# Patient Record
Sex: Male | Born: 1950 | Race: White | Hispanic: No | Marital: Married | Smoking: Former smoker
Health system: Southern US, Community
[De-identification: ages and names within clinical notes are randomized; demographics above are authoritative.]

## PROBLEM LIST (undated history)

## (undated) DIAGNOSIS — F419 Anxiety disorder, unspecified: Secondary | ICD-10-CM

## (undated) DIAGNOSIS — G4730 Sleep apnea, unspecified: Secondary | ICD-10-CM

## (undated) DIAGNOSIS — D0321 Melanoma in situ of right ear and external auricular canal: Secondary | ICD-10-CM

## (undated) DIAGNOSIS — I10 Essential (primary) hypertension: Secondary | ICD-10-CM

## (undated) DIAGNOSIS — E785 Hyperlipidemia, unspecified: Secondary | ICD-10-CM

## (undated) DIAGNOSIS — C3490 Malignant neoplasm of unspecified part of unspecified bronchus or lung: Secondary | ICD-10-CM

## (undated) DIAGNOSIS — I219 Acute myocardial infarction, unspecified: Secondary | ICD-10-CM

## (undated) DIAGNOSIS — I2510 Atherosclerotic heart disease of native coronary artery without angina pectoris: Secondary | ICD-10-CM

## (undated) DIAGNOSIS — E119 Type 2 diabetes mellitus without complications: Secondary | ICD-10-CM

## (undated) DIAGNOSIS — C3411 Malignant neoplasm of upper lobe, right bronchus or lung: Secondary | ICD-10-CM

## (undated) DIAGNOSIS — J449 Chronic obstructive pulmonary disease, unspecified: Secondary | ICD-10-CM

## (undated) HISTORY — PX: CARDIAC SURGERY: SHX584

## (undated) HISTORY — PX: OTHER SURGICAL HISTORY: SHX169

## (undated) HISTORY — PX: CARDIAC CATHETERIZATION: SHX172

## (undated) HISTORY — PX: SURGERY OF LIP: SUR1315

---
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
## 1999-04-07 ENCOUNTER — Emergency Department (HOSPITAL_COMMUNITY): Admission: EM | Admit: 1999-04-07 | Discharge: 1999-04-07 | Payer: Self-pay | Admitting: Emergency Medicine

## 1999-04-07 ENCOUNTER — Encounter: Payer: Self-pay | Admitting: Endocrinology

## 1999-07-14 ENCOUNTER — Inpatient Hospital Stay (HOSPITAL_COMMUNITY): Admission: EM | Admit: 1999-07-14 | Discharge: 1999-07-16 | Payer: Self-pay | Admitting: Specialist

## 2004-10-14 DIAGNOSIS — I2510 Atherosclerotic heart disease of native coronary artery without angina pectoris: Secondary | ICD-10-CM

## 2004-10-14 HISTORY — PX: CORONARY ARTERY BYPASS GRAFT: SHX141

## 2004-10-14 HISTORY — DX: Atherosclerotic heart disease of native coronary artery without angina pectoris: I25.10

## 2005-01-06 ENCOUNTER — Inpatient Hospital Stay (HOSPITAL_COMMUNITY): Admission: EM | Admit: 2005-01-06 | Discharge: 2005-01-15 | Payer: Self-pay | Admitting: Emergency Medicine

## 2005-02-14 ENCOUNTER — Encounter
Admission: RE | Admit: 2005-02-14 | Discharge: 2005-02-14 | Payer: Self-pay | Admitting: Thoracic Surgery (Cardiothoracic Vascular Surgery)

## 2005-08-12 ENCOUNTER — Ambulatory Visit: Payer: Self-pay | Admitting: Internal Medicine

## 2005-08-30 ENCOUNTER — Ambulatory Visit: Payer: Self-pay | Admitting: *Deleted

## 2008-02-22 ENCOUNTER — Emergency Department (HOSPITAL_COMMUNITY): Admission: EM | Admit: 2008-02-22 | Discharge: 2008-02-23 | Payer: Self-pay | Admitting: Emergency Medicine

## 2008-10-14 HISTORY — PX: CORONARY STENT PLACEMENT: SHX1402

## 2009-09-04 ENCOUNTER — Inpatient Hospital Stay (HOSPITAL_COMMUNITY): Admission: EM | Admit: 2009-09-04 | Discharge: 2009-09-07 | Payer: Self-pay | Admitting: Emergency Medicine

## 2009-09-05 ENCOUNTER — Encounter (INDEPENDENT_AMBULATORY_CARE_PROVIDER_SITE_OTHER): Payer: Self-pay | Admitting: Cardiovascular Disease

## 2010-10-09 DIAGNOSIS — I10 Essential (primary) hypertension: Secondary | ICD-10-CM

## 2010-10-09 HISTORY — DX: Essential (primary) hypertension: I10

## 2010-11-04 ENCOUNTER — Encounter: Payer: Self-pay | Admitting: Thoracic Surgery (Cardiothoracic Vascular Surgery)

## 2010-11-20 ENCOUNTER — Other Ambulatory Visit: Payer: Self-pay | Admitting: Cardiovascular Disease

## 2010-11-20 ENCOUNTER — Ambulatory Visit
Admission: RE | Admit: 2010-11-20 | Discharge: 2010-11-20 | Disposition: A | Discharge status: Home / Self Care | Payer: Medicaid Other | Source: Ambulatory Visit | Attending: Cardiovascular Disease | Admitting: Cardiovascular Disease

## 2010-11-20 DIAGNOSIS — R0602 Shortness of breath: Secondary | ICD-10-CM

## 2010-11-22 ENCOUNTER — Ambulatory Visit (HOSPITAL_COMMUNITY)
Admission: RE | Admit: 2010-11-22 | Discharge: 2010-11-22 | Disposition: A | Discharge status: Home / Self Care | Payer: Medicare Other | Source: Ambulatory Visit | Attending: Cardiovascular Disease | Admitting: Cardiovascular Disease

## 2010-11-22 DIAGNOSIS — J4489 Other specified chronic obstructive pulmonary disease: Secondary | ICD-10-CM | POA: Insufficient documentation

## 2010-11-22 DIAGNOSIS — J449 Chronic obstructive pulmonary disease, unspecified: Secondary | ICD-10-CM | POA: Insufficient documentation

## 2011-01-16 LAB — BASIC METABOLIC PANEL
BUN: 9 mg/dL (ref 6–23)
CO2: 24 mEq/L (ref 19–32)
CO2: 26 mEq/L (ref 19–32)
CO2: 29 mEq/L (ref 19–32)
Calcium: 8.9 mg/dL (ref 8.4–10.5)
Calcium: 9 mg/dL (ref 8.4–10.5)
Calcium: 9 mg/dL (ref 8.4–10.5)
Chloride: 108 mEq/L (ref 96–112)
Chloride: 99 mEq/L (ref 96–112)
Creatinine, Ser: 0.91 mg/dL (ref 0.4–1.5)
GFR calc Af Amer: >60 mL/min (ref >60)
GFR calc Af Amer: >60 mL/min (ref >60)
GFR calc non Af Amer: >60 mL/min (ref >60)
GFR calc non Af Amer: >60 mL/min (ref >60)
Glucose, Bld: 101 mg/dL — ABNORMAL HIGH (ref 70–99)
Glucose, Bld: 110 mg/dL — ABNORMAL HIGH (ref 70–99)
Glucose, Bld: 142 mg/dL — ABNORMAL HIGH (ref 70–99)
Potassium: 3.8 mEq/L (ref 3.5–5.1)
Potassium: 3.9 mEq/L (ref 3.5–5.1)
Sodium: 138 mEq/L (ref 135–145)
Sodium: 140 mEq/L (ref 135–145)
Sodium: 140 mEq/L (ref 135–145)

## 2011-01-16 LAB — LIPID PANEL
Cholesterol: 146 mg/dL (ref 0–200)
HDL: 22 mg/dL — ABNORMAL LOW (ref >39)
Triglycerides: 159 mg/dL — ABNORMAL HIGH (ref <150)

## 2011-01-16 LAB — CARDIAC PANEL(CRET KIN+CKTOT+MB+TROPI)
CK, MB: 1.5 ng/mL (ref 0.3–4.0)
CK, MB: 2.2 ng/mL (ref 0.3–4.0)
Relative Index: 1.4 (ref 0.0–2.5)
Relative Index: 3 — ABNORMAL HIGH (ref 0.0–2.5)
Relative Index: INVALID (ref 0.0–2.5)
Total CK: 107 U/L (ref 7–232)
Total CK: 108 U/L (ref 7–232)
Total CK: 125 U/L (ref 7–232)
Total CK: 85 U/L (ref 7–232)
Troponin I: 0.33 ng/mL — ABNORMAL HIGH (ref 0.00–0.06)

## 2011-01-16 LAB — DIFFERENTIAL
Basophils Relative: 0 % (ref 0–1)
Eosinophils Relative: 3 % (ref 0–5)
Lymphocytes Relative: 13 % (ref 12–46)
Lymphs Abs: 2.3 10*3/uL (ref 0.7–4.0)
Monocytes Relative: 8 % (ref 3–12)
Neutrophils Relative %: 75 % (ref 43–77)

## 2011-01-16 LAB — CBC
HCT: 41.2 % (ref 39.0–52.0)
Hemoglobin: 14.1 g/dL (ref 13.0–17.0)
Hemoglobin: 14.1 g/dL (ref 13.0–17.0)
Hemoglobin: 14.2 g/dL (ref 13.0–17.0)
MCHC: 34.3 g/dL (ref 30.0–36.0)
MCV: 92.2 fL (ref 78.0–100.0)
Platelets: 235 10*3/uL (ref 150–400)
RBC: 4.45 MIL/uL (ref 4.22–5.81)
RBC: 4.46 MIL/uL (ref 4.22–5.81)
RBC: 5.01 MIL/uL (ref 4.22–5.81)
RDW: 13 % (ref 11.5–15.5)
RDW: 13 % (ref 11.5–15.5)
WBC: 13.7 10*3/uL — ABNORMAL HIGH (ref 4.0–10.5)
WBC: 17.1 10*3/uL — ABNORMAL HIGH (ref 4.0–10.5)

## 2011-01-16 LAB — POCT CARDIAC MARKERS: Myoglobin, poc: 113 ng/mL (ref 12–200)

## 2011-01-16 LAB — URINALYSIS, ROUTINE W REFLEX MICROSCOPIC
Bilirubin Urine: NEGATIVE
Glucose, UA: NEGATIVE mg/dL
Hgb urine dipstick: NEGATIVE
Ketones, ur: NEGATIVE mg/dL
Nitrite: NEGATIVE
Protein, ur: NEGATIVE mg/dL
Specific Gravity, Urine: 1.013 (ref 1.005–1.030)
Urobilinogen, UA: 1 mg/dL (ref 0.0–1.0)
pH: 7 (ref 5.0–8.0)

## 2011-01-16 LAB — CK TOTAL AND CKMB (NOT AT ARMC)
CK, MB: 4 ng/mL (ref 0.3–4.0)
Relative Index: 2.9 — ABNORMAL HIGH (ref 0.0–2.5)

## 2011-01-16 LAB — PROTIME-INR
INR: 0.99 (ref 0.00–1.49)
Prothrombin Time: 13.3 seconds (ref 11.6–15.2)

## 2011-01-16 LAB — TROPONIN I: Troponin I: 0.31 ng/mL — ABNORMAL HIGH (ref 0.00–0.06)

## 2011-01-16 LAB — PLATELET COUNT: Platelets: 227 10*3/uL (ref 150–400)

## 2011-03-01 NOTE — Cardiovascular Report (Signed)
[name], [name] NO.:  [id number]   MEDICAL RECORD NO.:  [id number]          PATIENT TYPE:  INP   LOCATION:  2903                         FACILITY:  [facility]   PHYSICIAN:  Nicki Guadalajara, M.D.     DATE OF BIRTH:  04-04-1951   DATE OF PROCEDURE:  01/07/2005  DATE OF DISCHARGE:                              CARDIAC CATHETERIZATION   PROCEDURE:  Cardiac catheterization.   CARDIOLOGIST:  Nicki Guadalajara, M.D.   INDICATIONS FOR PROCEDURE:  [name] is a 60 year old gentleman who  has not sought medical attention for years.  He has a longstanding history  of tobacco use, having started at age 37.  He currently smokes up to two to  three packs of cigarettes per day.  When he is not smoking he often chews  tobacco.  He works as a Visual merchandiser.  Yesterday while attending to a calf, he  developed significant new onset substernal chest pressure.  The symptom  complex lasted for approximately 45 minutes.  Ultimately he presented to  [facility].  His symptom complex improved with sublingual  nitroglycerin.  An electrocardiogram showed nonspecific T-wave changes in V4  through V6.  He also was noted to be significantly hypertensive on  presentation.  The initial CPK enzymes were negative, but the subsequent  troponin was slightly positive.  He was treated with IV nitroglycerin and  heparin and started on beta blocker therapy, and scheduled for a cardiac  catheterization.   DESCRIPTION OF PROCEDURE:  After premedication with Valium, a total of 7 mg  intravenously, the patient was prepped and draped in the usual fashion.  His  right femoral artery was punctured anteriorly and a 6-French sheath was  inserted.  Diagnostic catheterization was done with a 6-French Judkins #4  left and right diagnostic catheters.  The right catheter was also used for  selective angiography into the left subclavian and internal mammary artery  system.  A 6-French pigtail catheter  was used for biplane cine left  arteriography as well as distal aortography.  At this point, I broke scrub  and reviewed the cine angiograms.  I also asked Dr. Pearletha Furl. Alanda Amass to  come into the laboratory and cine angiograms were also reviewed.  Hemostasis  was done by direct manual pressure.  The patient tolerated the procedure  well.   HEMODYNAMIC DATA:  Central aortic pressure:  146/81.  Left ventricular pressure:  146/13.   ANGIOGRAPHIC DATA:  1.  LEFT MAIN CORONARY ARTERY:  The left main coronary artery was      angiographically normal and trifurcated into an LAD, a small      intermediate system, and a left circumflex system.  There was evidence      for mild coronary calcification proximally.  The proximal LAD had 70%      narrowing prior to giving rise to the first diagonal vessel.  The first      diagonal vessel had diffuse disease of 70%-80% prior to its bifurcation.      The superior limb had 70% ostial narrowing.  The inferior  limb of a      large diagonal system had diffuse 90%-95% stenosis.  The LAD after the      diagonal vessel had diffuse narrowing of 40%-60%, followed by an 80%-90%      segment.  2.  INTERMEDIATE CORONARY ARTERY:  The intermediate vessel was a small      caliber vessel.  3.  CIRCUMFLEX CORONARY ARTERY:  The circumflex vessel was a large caliber      vessel that had 70%-80% proximal stenosis on the proximal bend of the      vessel.  4.  RIGHT CORONARY ARTERY:  The right coronary artery is a large caliber      dominant vessel that had diffuse smooth narrowing of 30% proximally, 40%-      50% narrowing in the region of the crux with 90% stenosis in an acute      marginal branch proximally.  There also was 50% near ostial PDA      narrowing.  5.  LEFT SUBCLAVIAN AND INTERNAL MAMMARY ARTERY SYSTEM:  The left subclavian      and internal mammary artery system was normal an suitable for CVG      revascularization surgery.   BIPLANE CINE LEFT  VENTRICULOGRAPHY:  Revealed mild LV dysfunction with focal  hypocontractility in the mid-inferior wall and in the mid-distal  anterolateral wall.  The ejection fraction was approximately 40%-45%.  There  also was hypocontractility in the inferior apical segment on the LAO  projection.   DISTAL AORTOGRAPHY:  Revealed vessel tortuosity without high-grade  obstructive disease.   IMPRESSION:  1.  Mild left ventricular dysfunction with hypocontractility involving the      mid-inferior wall, the inferoapical region and the anterolateral      segment.  2.  Multivessel coronary artery disease with a diffuse 70% proximal left      anterior descending coronary artery stenosis, a 70%-80% stenosis      proximally in the diagonal vessel, with a 90%-95% mid-diagonal stenosis      in a large inferior branch of the diagonal vessel, a 70% ostial superior      branch and 30%, 60% and 80%-90% middle left anterior descending coronary      artery stenoses, a 70%-80% stenosis in the circumflex vessel and diffuse      30% proximal and 40%-50% stenosis in the region of the acute margin of      the right coronary artery with 90% stenosis in the acute marginal      branch, with a 50% ostial posterior descending artery stenosis.   RECOMMENDATIONS:  The cine angiograms will be reviewed with colleagues.  A  CVTS consultation will be obtained, in light of the patient's diffuse  multivessel coronary artery disease.  Although he is only 60 years of age  chronologically, physiologically his significant tobacco history has  undoubtedly accelerated his aging process.  Aggressive lipid intervention  and medical therapy will also be commenced.      TK/MEDQ  D:  01/07/2005  T:  01/07/2005  Job:  [id number]   cc:   Cardiac Cath Lab  -  [facility]

## 2011-03-01 NOTE — Discharge Summary (Signed)
[name], Troy [name] NO.:  [id number]   MEDICAL RECORD NO.:  [id number]          PATIENT TYPE:  INP   LOCATION:  2029                         FACILITY:  [facility]   PHYSICIAN:  Salvatore Decent. Dorris Fetch, M.D.DATE OF BIRTH:  August 31, 1951   DATE OF ADMISSION:  01/06/2005  DATE OF DISCHARGE:  01/15/2005                               DISCHARGE SUMMARY   ADMISSION DIAGNOSIS:  Unstable angina.   DISCHARGE DIAGNOSIS:  1. 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  2. Heavy tobacco abuse.  3. History of injury to his left hand requiring repair of metacarpals.  4. 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  5. Dyslipidemia.  6. Intolerance to codeine which causes nausea and vomiting.   PROCEDURE:  1. January 11, 2005, median sternotomy for coronary artery bypass      grafting x 4 using the left internal mammary artery to the mid left      anterior descending, saphenous vein graft to the first diagonal,      saphenous vein graft to the first obtuse marginal, saphenous vein      graft to the posterior descending, endoscopic vein harvesting from      the right leg, surgeon Salvatore Decent. Dorris Fetch, M.D.  2. January 07, 2005, cardiac catheterization by Dr. Nicki Guadalajara with      impression showing mild left ventricular dysfunction, multivessel      coronary artery disease, ejection fraction estimated at      approximately 40-45%.  3. January 08, 2005, carotid Duplex showing bilateral mild intimal wall      changes with mild heterogeneous plaque at the bifurcation.  No      evidence of internal carotid artery stenosis.  The vertebral artery      flow was antegrade.  4. January 08, 2005, lower extremity arterial studies showing ankle      brachial indices greater than 1 bilaterally with normal Doppler      wave forms at rest.   BRIEF HISTORY:  Troy [name] is a 60 year old Caucasian male who had not  sought medical attention for years.  He has a long-standing history of  tobacco use  having started at age 60.  Prior to admission, he smoked 2-3  packs cigarettes per day.  When he is not smoking, he also chews  tobacco.  He works as a Visual merchandiser.  On January 06, 2005, while attending to a  calf, he developed significant new onset substernal chest pressure.  The  symptom complex lasted approximately 45 minutes.  Ultimately, he  presented to [facility].  His chest pain was then relieved with  sublingual nitroglycerin.  EKG showed nonspecific T-wave changes in  leads v4 through v6.  He also was noted to be significantly hypertensive  on presentation.  Initial CPK enzymes were negative but subsequently  slightly positive.  He was treated with IV nitroglycerin and heparin and  started on beta blocker therapy and admitted to [facility] for  scheduled cardiac catheterization.   HOSPITAL COURSE:  On January 06, 2005, Troy [name] was admitted to [facility] for unstable angina.  He was managed on IV nitroglycerin  and heparin as well as beta blocker.  He underwent cardiac  catheterization with results as discussed above.  Based on the findings  of multivessel coronary artery disease, Dr. Charlett Lango of CVTS  was consulted regarding possible coronary revascularization.  After  examining the patient and review of his films, it was felt that he would  benefit from coronary artery bypass grafting.  After discussing the  risks, benefits, and alternatives, Troy [name] agreed to proceed.  The  surgery was scheduled for January 11, 2005.  In the meantime, he remained  on the same medication regimen.  He also underwent a tobacco cessation  consult.  He also underwent pre-cardiac surgery Doppler studies with  results as discussed above.  On March 31, he did undergo coronary artery  bypass grafting.  Overall, he was felt to tolerate the procedure well  and there was no known interoperative complications.  Postoperatively,  he was transferred to the surgical intensive  care unit in stable  condition.  By that evening, he was extubated and neurologically intact.  He remained in the intensive care unit through postoperative day two.  During this time, his chest tubes were discontinued as there was minimal  output.  He did require aggressive pulmonary toilet and was started on  Humibid as well as Albuterol nebulizer treatments.  He also required  gentle diuresis for mild fluid volume excess.  Of note, postoperatively,  his labs were significant for leukocytosis with a white count up to  21.5.  This was thought most likely due to respiratory nature.  He was  started on oral Avelox.  In addition, he did have minimal sanguinous  drainage from the superior aspect of his sternal incision, however,  there was no surrounding erythema and it was felt that this was most  likely not the source of his leukocytosis.  Once on Avelox, his white  blood count did begin trending down.  He also remained afebrile.  By  postoperative day three, he had been transferred out to unit 2000.  He  remained stable with vital signs showing blood pressure 129/84, heart  rate in sinus rhythm ranging in the 90s to about 115.  Due to his mild  tachycardia, his beta blocker was increased with a good response.  In  the morning, he was still requiring 2 liters of oxygen per nasal  cannula, however, by the afternoon, he had been weaned and was  saturating greater than 90%.  He did report some mild shortness of  breath, however, he also reported this was at his baseline.  Otherwise,  he was feeling well.  His bowel and bladder functioned appropriately.  His pain was controlled on oral medications.  He was ambulating  independently.  On exam, his heart had a regular rate and rhythm and  there was no murmur.  His lung sounds were coarse but chest x-ray showed  stable left lower lobe atelectasis and tiny bilateral pleural effusions.  He was continued on his previously mentioned pulmonary regimen.   His  abdomen was soft, nontender, good bowel sounds.  Extremities showed no  edema.  His right leg incisions were clean and dry.  His sternal  incision as previously described.  His weight was nearly at baseline.  His labs were also stable showing decreasing white blood count as well  as decreasing creatinine at 1.4 which had peaked at 1.6.  It was felt  that  if Troy [name] could continue to progress in this manner that he  would be stable for discharge in the next 24-48 hours.  The official  discharge orders will be written during morning rounds pending there are  no significant changes in his status.  Troy [name] anticipated date of  discharge is January 15, 2005.   CONSULTATIONS:  1. Smoking cessation.  2. Cardiac rehab, phase I.  3. Pharmacy for heparin dosing.  4. Cardiology, Dr. Nicki Guadalajara (attending service on admission).   LABORATORY DATA:  Recent labs show a white blood count 18.6, hemoglobin  11, hematocrit 31.5, platelet count 214.  Sodium 136, potassium 3.9,  chloride 101, CO2 26, blood glucose 102, BUN 26, creatinine 1.4, calcium  8.7, magnesium 2.6.  Hemoglobin A1C 5.7.  Total bilirubin 0.9, alkaline  phos 53, SGOT 17, SGPT 13, total protein 5.3, albumin 2.8.   DISCHARGE MEDICATIONS:  1. Enteric coated aspirin 325 mg 1 p.o. daily.  2. Lopressor 25 mg 1 p.o. b.i.d.  3. Lipitor 80 mg 1 p.o. q.a.m.  4. Wellbutrin XL 150 mg 1 p.o. daily.  5. Xanax 0.5 mg 1 p.o. b.i.d. p.r.n. anxiety.  6. Multi-vitamin 1 p.o. daily.  7. Avelox 400 mg 1 p.o. daily x 7 days.  8. Albuterol inhaler 2 puffs q.4h. p.r.n. shortness of breath or      wheezing.  9. Tylox 1-2 tablets p.o. q.4-6h as needed for pain.   DISCHARGE INSTRUCTIONS:  Activities:  He is instructed to avoid driving  or heavy lifting more than 10 pounds.  He should continue daily  breathing exercises and daily walking.  He should follow with a low salt  diet.  He may shower and clean his incisions daily with mild soap and   water.  He should notify the CVTS office if he develops fever greater  than 101, redness or drainage from his incision site.  Follow up:  1. He is to follow up with Dr. Dorris Fetch at the CVTS office on Feb 14, 2005, at 11:45 a.m.  He is to have a chest x-ray at [facility] one hour before this appointment and was instructed to      bring his chest x-ray films with him to the CVTS office.  2. He is to call [phone] to schedule two weeks follow up with Dr.      Nicki Guadalajara.      Jerold Coombe, P.A.      Salvatore Decent Dorris Fetch, M.D.  Electronically Signed    AWZ/MEDQ  D:  01/14/2005  T:  01/14/2005  Job:  [id number]   cc:   Nicki Guadalajara, M.D.

## 2011-03-01 NOTE — Op Note (Signed)
NAMERAHMIR, BEEVER NO.:  [id number]   MEDICAL RECORD NO.:  [id number]          PATIENT TYPE:  INP   LOCATION:  2316                         FACILITY:  [facility]   PHYSICIAN:  Salvatore Decent. Dorris Fetch, M.D.DATE OF BIRTH:  1951-02-14   DATE OF PROCEDURE:  01/11/2005  DATE OF DISCHARGE:                                 OPERATIVE REPORT   PREOPERATIVE DIAGNOSIS:  Three vessel disease with unstable angina.   POSTOPERATIVE DIAGNOSIS:  Three vessel disease with unstable angina.   PROCEDURE:  Median sternotomy, extracorporeal circulation, coronary artery  bypass grafting x4 (left internal mammary artery to left anterior  descending, saphenous vein graft to first diagonal, saphenous vein graft to  first obtuse marginal, saphenous vein graft to posterior descending),  endoscopic vein harvest right leg.   SURGEON:  Salvatore Decent. Dorris Fetch, M.D.   ASSISTANT:  Jerold Coombe, P.A.   ANESTHESIA:  General anesthesia.   FINDINGS:  Good quality targets, excellent quality conduits, left  ventricular hypertrophy.   CLINICAL NOTE:  Mr. Hoaglin is a 60 year old gentleman with a history of  heavy tobacco abuse who presented with an unstable coronary syndrome.  At  cardiac catheterization he had three vessel disease with mildly impaired  left ventricular function.  He was referred for coronary artery bypass  grafting.  The indications, risks, benefits, and alternatives were discussed  in detail with the patient.  He understood and accepted the risks and agreed  to proceed.   The patient was not felt to be a candidate for bilateral mammary arteries  due to his history of heavy tobacco abuse, anatomy was not suitable with  tight enough lesions in circumflex or PDA to justify radial artery harvest.   DESCRIPTION OF PROCEDURE:  Mr. Schwartz was brought to the preoperative  holding area on January 11, 2005.  There lines were placed to monitor  arterial, central venous and pulmonary  arterial pressure.  EKG leads were  placed for continuous telemetry.  Intravenous antibiotics were administered.  The patient was taken to the operating room, anesthetized and intubated.  A  Foley catheter was placed.  The chest, abdomen and legs were prepped and  draped in the usual fashion.   A median sternotomy was performed. The left internal mammary artery was  harvested using standard technique. Simultaneously, incision was made in the  medial aspect of the right leg at the level of the knee.  The greater  saphenous vein was harvested from mid calf to groin endoscopically.  Both  the saphenous vein and mammary artery were excellent quality conduits.  There was 5000 units of heparin was administered while taking down the  conduits.  The remainder of the full heparin dose was administered prior to  initiating cardiopulmonary bypass.   The pericardium was opened.  The ascending aorta was inspected and palpated.  It was normal size, normal caliber with no palpable atherosclerotic disease.  The aorta was cannulated via concentric 2-0 Ethibond nonpledgeted  pursestring suture.  A dual stage venous cannula was placed via pursestring  suture in the right atrial appendage.  Cardiopulmonary bypass was  instituted  and the patient was cooled to 32 degrees C.  The coronary arteries were  inspected and the anastomotic sites were chosen.  The conduits were  inspected and cut to length.  Of note, the acute marginal branch of the  right coronary was a very small vessel, not suitable for grafting.  The  heart was insulated with a foam pad in the pericardium.  A temperature probe  was placed in the myocardium septum.  A cardioplegia cannula was placed in  the ascending aorta.   The aorta was crossclamped.  The left ventricle was entered via the aortic  root vent.  Cardiac arrest was achieved with a combination of cold blood  antegrade cardioplegia and topical iced saline.  Initially 500 mL of   cardioplegia was administered.  There was rapid septal cooling to 9 degrees  C.  Additional cardioplegia was administered at the completion of each vein  graft, down the graft as well as via the aortic root in order to maintain  myocardial septal temperature to less than 15 degrees C. throughout the  procedure.   The following distal anastomoses were performed.  First a reverse saphenous  vein graft was placed end-to-side to the posterior descending branch of the  right coronary.  The vein was of excellent quality.  The posterior  descending was a good quality target.  It was 1.5 mm in diameter.  An end-to-  side anastomosis was performed with a running 7-0 Prolene suture.  This  anastomosis and all other were probed proximally and distally at its  completion to insure patency.  The graft flushed easily and cardioplegia was  administered.   Next, a reversed saphenous vein graft was placed end-to-side to the first  obtuse marginal branch of the left circumflex coronary artery.  This was a  1.5 mm good quality target given the vein was good quality, the anastomosis  was performed with a running 7-0 Prolene suture.  There was excellent flow.  Cardioplegia was administered.  There was good hemostasis.   Next, a reverse saphenous vein graft was placed end-to-side to the first  diagonal branch of the LAD.  This was a large diagonal vessel that traversed  parallel to the LAD.  It was 1.5 mm and good quality.  The vein graft was a  good quality as well.   Next, the left internal mammary artery was brought through a window in the  pericardium.  The distal end was spatulated then was anastomosed end-to-side  to the distal LAD.  The LAD was a 1.5 mm good quality target.  The mammary  was a 2 mm good quality conduit.  The anastomosis was performed end-to-side  with a running 8-0 Prolene suture.  At the completion of the anastomosis, bulldog clamp was briefly removed and was inspected for  hemostasis.  Immediate rapid septal rewarming was noted.  The bulldog clamp was replaced.  The mammary pedicle was tacked to the epicardial surface of the heart with 6-  0 Prolene sutures.  Additional cardioplegia was administered down the vein  grafts and the aortic root.   The vein grafts were cut to length.  The cardioplegia cannula was removed  from the ascending aorta and the proximal vein graft to the anastomoses were  performed through 4.5 mm punch aortotomies with running 6-0 Prolene sutures.  At the completion of the final proximal anastomosis, lidocaine was  administered.  The bulldog clamp was again removed from the mammary artery.  Immediate and  rapid septal rewarming was once again noted.  The aortic root  was deaired.  The patient was placed in Trendelenburg position and the  aortic crossclamp was removed.  The total crossclamp time was 81 minutes.   While the patient was being rewarmed, all proximal and distal anastomoses  were inspected for hemostasis.  Epicardial pacing wires were placed on the  right ventricle and right atrium.  When the patient had been rewarmed to a  core temperature of temperature of 37 degrees C, the patient was weaned from  cardiopulmonary bypass without difficulty.  He was on a low dose dopamine  infusion at the time of separation from bypass and was DDD paced.  The  initial cardiac index was greater than 2 liters/minute/sq m.  Total bypass  time was 115 minutes.  Patient remained hemodynamically stable throughout  the post bypass period.   A test dose of protamine was administered and was well tolerated.  The  atrial and aortic cannulae were removed.  The remainder of the protamine was  administered without incident.  The chest was irrigated with one liter of  warm normal saline containing 1 g of vancomycin.  Hemostasis was achieved.  The pericardium was reapproximated with interrupted 3-0 silk sutures  __________ easily without tension.  the  left pleural and two mediastinal  chest tubes sere placed through separate subcostal incisions.  The sternum  was closed with interrupted heavy gauge wires.  The pectoralis, subcutaneous  tissue and skin closed in standard fashion.  Subcuticular closures were used  for the skin.  All sponge, needle and instrument counts were correct at the  end of the procedure.  There were no intraoperative complications.  The  patient was taken from the operating room to the surgical intensive care  unit in critical but stable condition.      SCH/MEDQ  D:  01/11/2005  T:  01/11/2005  Job:  [id number]   cc:   Nicki Guadalajara, M.D.  [address]  [place], [place] [address]  Fax: [phone]

## 2011-03-01 NOTE — Consult Note (Signed)
NAMETYJUAN, DEMETRO NO.:  [id number]   MEDICAL RECORD NO.:  [id number]          PATIENT TYPE:  INP   LOCATION:  2903                         FACILITY:  [facility]   PHYSICIAN:  Salvatore Decent. Dorris Fetch, M.D.DATE OF BIRTH:  April 08, 1951   DATE OF CONSULTATION:  01/08/2005  DATE OF DISCHARGE:                                   CONSULTATION   REASON FOR CONSULTATION:  Three-vessel disease with unstable angina.   HISTORY OF PRESENT ILLNESS:  Mr. Crooker is a 60 year old gentleman, who  presents with a chief complaint of chest pain.  He has no prior cardiac  history and no previous of angina.  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  He  also developed some tingling in his left hand and fingers.  He took 2  aspirin.  He did feel clammy and short of breath.  He did not have any  nausea or vomiting.  He did get dizzy.  He continued to have the pain.  He  went to his neighbor and called EMS.  The pain ended up lasting between 15  and 30 minutes; the patient was not sure.  He ruled out for a myocardial  infarction, but his troponins were slightly elevated at 0.07.  Yesterday, he  underwent cardiac catheterization which revealed severe three-vessel  coronary disease with an ejection fraction of 40-45%.  He has been pain free  since admission, is currently on intravenous heparin.   PAST MEDICAL HISTORY:  1.  Heavy tobacco abuse.  2.  He has had injury to his left hand, requiring repair of metacarpals.  3.  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  4.  He denies diabetes, hypertension, known hyperlipidemia, although he has      not consistently seeing doctors.   MEDICATIONS AT TIME OF ADMISSION:  Aspirin 81 mg daily.   ALLERGIES:  He gets nausea and vomiting with CODEINE.   FAMILY HISTORY:  Remarkable for early coronary disease.  Father  died of MI  at age 69.  His father also had had a stroke.  He had a brother who had a  pacemaker.   SOCIAL HISTORY:  He is married.  He works on a farm.  He smokes heavily, 2-3  packs a day since age 63.   REVIEW OF SYSTEMS:  The patient had been in his usual state of good health.  He denies any early fatigue or tiring.  He has had chest pain just with the  recent event and none previously.  He does have a nonproductive cough.  He  does have some reflux symptoms.  No other changes in bowel habits.  All  other systems are negative.   PHYSICAL EXAMINATION:  GENERAL:  Mr. Rackley is a 60 year old gentleman who  appears older than his stated age.  He is well-developed and well-nourished.  VITAL SIGNS:  Blood pressure is 125/75, pulse 77, respirations are 18.  He  is 5 feet 8 inches tall and 160 pounds.  HEENT:  Remarkable for dentures, upper and lower.  NECK:  Supple without thyromegaly, adenopathy, or bruits.  CARDIAC:  Regular rate and rhythm, normal S1 and S2.  No rubs, murmurs, or  gallops.  LUNGS:  Mild rhonchi.  No wheezing.  ABDOMEN:  Soft, nontender.  EXTREMITIES:  Without clubbing, cyanosis, or edema.  He has 2+ pulses  throughout.  There are no significant varicosities or peripheral edema.  NEUROLOGIC:  He is alert and oriented x 3 and grossly intact.  SKIN:  Warm and dry.   LABORATORY DATA:  White count was 16.2, hematocrit 39, platelets 258, PT  12.2, PTT 30, cholesterol 183, HDL low at 28, and LDL was 120.  Sodium 140,  potassium 4.2, BUN and creatinine 8 and 1.1.  PSA was 0.28.  EKG showed  nonspecific T-wave changes at V4 through 6.  His chest x-ray showed no  active disease.   IMPRESSION:  Mr. Ingwersen is a 60 year old gentleman, who has significant  cardiac risk factors of heavy tobacco use a strong family history.  He has  no previous cardiac history but now presents with unstable angina.  He has  impaired left ventricular function and three-vessel coronary disease.   Coronary artery bypass grafting is indicated for survival benefit and relief  of symptoms.  I had a discussion with the patient and his family regarding  the indications, risks, benefits, and alternatives.  They understand the  nature and extent of the surgery, including the incisions to be used,  expected postoperative stay.  They understand that there are risks which  include but are not limited to death, stroke, MI, DVT, PE, bleeding,  possible need for transfusions, infections, as well as other organ system  dysfunction including respiratory, renal, or GI complications.  He  understands and accepts these risks and agrees to proceed with surgery.  Pulmonary function testing is planned for today to help evaluate his  pulmonary risks.  We will plan to proceed with surgery either Wednesday  afternoon or Thursday morning as the schedule allows.      SCH/MEDQ  D:  01/08/2005  T:  01/08/2005  Job:  [id number]   cc:   Nicki Guadalajara, M.D.  [phone] [address]  [place], [place] [address]  Fax: [phone]

## 2011-10-18 ENCOUNTER — Inpatient Hospital Stay (HOSPITAL_COMMUNITY)
Admission: EM | Admit: 2011-10-18 | Discharge: 2011-10-22 | DRG: 287 | Disposition: A | Discharge status: Home / Self Care | Payer: Medicare Other | Attending: Cardiovascular Disease | Admitting: Cardiovascular Disease

## 2011-10-18 ENCOUNTER — Emergency Department (HOSPITAL_COMMUNITY): Payer: Medicare Other

## 2011-10-18 ENCOUNTER — Inpatient Hospital Stay (HOSPITAL_COMMUNITY): Payer: Medicare Other

## 2011-10-18 ENCOUNTER — Other Ambulatory Visit: Payer: Self-pay

## 2011-10-18 ENCOUNTER — Encounter: Payer: Self-pay | Admitting: Emergency Medicine

## 2011-10-18 DIAGNOSIS — J4489 Other specified chronic obstructive pulmonary disease: Secondary | ICD-10-CM | POA: Diagnosis present

## 2011-10-18 DIAGNOSIS — IMO0001 Reserved for inherently not codable concepts without codable children: Secondary | ICD-10-CM | POA: Diagnosis present

## 2011-10-18 DIAGNOSIS — J449 Chronic obstructive pulmonary disease, unspecified: Secondary | ICD-10-CM | POA: Diagnosis present

## 2011-10-18 DIAGNOSIS — Z9861 Coronary angioplasty status: Secondary | ICD-10-CM

## 2011-10-18 DIAGNOSIS — Z951 Presence of aortocoronary bypass graft: Secondary | ICD-10-CM

## 2011-10-18 DIAGNOSIS — I2510 Atherosclerotic heart disease of native coronary artery without angina pectoris: Principal | ICD-10-CM | POA: Diagnosis present

## 2011-10-18 DIAGNOSIS — I25810 Atherosclerosis of coronary artery bypass graft(s) without angina pectoris: Secondary | ICD-10-CM | POA: Diagnosis present

## 2011-10-18 DIAGNOSIS — I255 Ischemic cardiomyopathy: Secondary | ICD-10-CM | POA: Diagnosis present

## 2011-10-18 DIAGNOSIS — I200 Unstable angina: Secondary | ICD-10-CM | POA: Diagnosis present

## 2011-10-18 DIAGNOSIS — E785 Hyperlipidemia, unspecified: Secondary | ICD-10-CM | POA: Diagnosis present

## 2011-10-18 DIAGNOSIS — I2589 Other forms of chronic ischemic heart disease: Secondary | ICD-10-CM | POA: Diagnosis present

## 2011-10-18 DIAGNOSIS — I10 Essential (primary) hypertension: Secondary | ICD-10-CM | POA: Diagnosis present

## 2011-10-18 DIAGNOSIS — F17200 Nicotine dependence, unspecified, uncomplicated: Secondary | ICD-10-CM | POA: Diagnosis present

## 2011-10-18 DIAGNOSIS — I2582 Chronic total occlusion of coronary artery: Secondary | ICD-10-CM | POA: Diagnosis present

## 2011-10-18 DIAGNOSIS — J439 Emphysema, unspecified: Secondary | ICD-10-CM | POA: Diagnosis present

## 2011-10-18 HISTORY — DX: Essential (primary) hypertension: I10

## 2011-10-18 HISTORY — DX: Atherosclerotic heart disease of native coronary artery without angina pectoris: I25.10

## 2011-10-18 HISTORY — DX: Hyperlipidemia, unspecified: E78.5

## 2011-10-18 HISTORY — DX: Anxiety disorder, unspecified: F41.9

## 2011-10-18 HISTORY — DX: Chronic obstructive pulmonary disease, unspecified: J44.9

## 2011-10-18 LAB — CBC
HCT: 43 % (ref 39.0–52.0)
Hemoglobin: 14.8 g/dL (ref 13.0–17.0)
Hemoglobin: 14.7 g/dL (ref 13.0–17.0)
MCH: 30.9 pg (ref 26.0–34.0)
MCH: 30.8 pg (ref 26.0–34.0)
MCHC: 34.2 g/dL (ref 30.0–36.0)
MCV: 90.1 fL (ref 78.0–100.0)
Platelets: 309 10*3/uL (ref 150–400)
RBC: 4.79 MIL/uL (ref 4.22–5.81)
RBC: 4.77 MIL/uL (ref 4.22–5.81)
RDW: 13.4 % (ref 11.5–15.5)
WBC: 13.3 10*3/uL — ABNORMAL HIGH (ref 4.0–10.5)

## 2011-10-18 LAB — COMPREHENSIVE METABOLIC PANEL
ALT: 19 U/L (ref 0–53)
AST: 20 U/L (ref 0–37)
Albumin: 3.6 g/dL (ref 3.5–5.2)
Alkaline Phosphatase: 67 U/L (ref 39–117)
BUN: 11 mg/dL (ref 6–23)
CO2: 28 mEq/L (ref 19–32)
Calcium: 9.4 mg/dL (ref 8.4–10.5)
Chloride: 100 mEq/L (ref 96–112)
Creatinine, Ser: 1.01 mg/dL (ref 0.50–1.35)
GFR calc Af Amer: >90 mL/min (ref >90)
GFR calc non Af Amer: 78 mL/min — ABNORMAL LOW (ref >90)
Glucose, Bld: 91 mg/dL (ref 70–99)
Potassium: 4.3 mEq/L (ref 3.5–5.1)
Sodium: 137 mEq/L (ref 135–145)
Total Bilirubin: 0.4 mg/dL (ref 0.3–1.2)
Total Protein: 7.1 g/dL (ref 6.0–8.3)

## 2011-10-18 LAB — APTT: aPTT: 29 seconds (ref 24–37)

## 2011-10-18 LAB — DIFFERENTIAL
Basophils Absolute: 0.1 10*3/uL (ref 0.0–0.1)
Basophils Relative: 1 % (ref 0–1)
Eosinophils Absolute: 0.2 10*3/uL (ref 0.0–0.7)
Eosinophils Relative: 1 % (ref 0–5)
Lymphocytes Relative: 23 % (ref 12–46)
Lymphs Abs: 3 10*3/uL (ref 0.7–4.0)
Monocytes Absolute: 1.3 10*3/uL — ABNORMAL HIGH (ref 0.1–1.0)
Monocytes Relative: 10 % (ref 3–12)
Neutro Abs: 8.7 10*3/uL — ABNORMAL HIGH (ref 1.7–7.7)
Neutrophils Relative %: 66 % (ref 43–77)

## 2011-10-18 LAB — BASIC METABOLIC PANEL
CO2: 32 mEq/L (ref 19–32)
Glucose, Bld: 117 mg/dL — ABNORMAL HIGH (ref 70–99)
Potassium: 4.1 mEq/L (ref 3.5–5.1)
Sodium: 138 mEq/L (ref 135–145)

## 2011-10-18 LAB — PROTIME-INR
INR: 0.92 (ref 0.00–1.49)
Prothrombin Time: 12.6 seconds (ref 11.6–15.2)

## 2011-10-18 LAB — CARDIAC PANEL(CRET KIN+CKTOT+MB+TROPI)
CK, MB: 2.8 ng/mL (ref 0.3–4.0)
Relative Index: INVALID (ref 0.0–2.5)
Total CK: 96 U/L (ref 7–232)
Troponin I: <0.3 ng/mL (ref <0.30)

## 2011-10-18 LAB — POCT I-STAT TROPONIN I

## 2011-10-18 LAB — MRSA PCR SCREENING: MRSA by PCR: NEGATIVE

## 2011-10-18 LAB — MAGNESIUM: Magnesium: 2.3 mg/dL (ref 1.5–2.5)

## 2011-10-18 MED ORDER — BUSPIRONE HCL 5 MG PO TABS
5.0000 mg | ORAL_TABLET | Freq: Two times a day (BID) | ORAL | Status: DC
  Administered 2011-10-18 – 2011-10-22 (×8): 5 mg via ORAL
  Filled 2011-10-18 (×9): qty 1

## 2011-10-18 MED ORDER — ACTIVE PARTNERSHIP FOR HEALTH OF YOUR HEART BOOK
Freq: Once | Status: AC
  Administered 2011-10-19: 05:00:00
  Filled 2011-10-18: qty 1

## 2011-10-18 MED ORDER — TIOTROPIUM BROMIDE MONOHYDRATE 18 MCG IN CAPS
18.0000 ug | ORAL_CAPSULE | Freq: Every day | RESPIRATORY_TRACT | Status: DC
  Administered 2011-10-19 – 2011-10-22 (×3): 18 ug via RESPIRATORY_TRACT
  Filled 2011-10-18: qty 5

## 2011-10-18 MED ORDER — FAMOTIDINE 20 MG PO TABS
20.0000 mg | ORAL_TABLET | Freq: Every day | ORAL | Status: DC
  Administered 2011-10-18 – 2011-10-22 (×5): 20 mg via ORAL
  Filled 2011-10-18 (×5): qty 1

## 2011-10-18 MED ORDER — ZOLPIDEM TARTRATE 5 MG PO TABS
10.0000 mg | ORAL_TABLET | Freq: Every evening | ORAL | Status: DC | PRN
  Administered 2011-10-19: 10 mg via ORAL
  Filled 2011-10-18: qty 2

## 2011-10-18 MED ORDER — LOSARTAN POTASSIUM 50 MG PO TABS
100.0000 mg | ORAL_TABLET | Freq: Every day | ORAL | Status: DC
  Administered 2011-10-18 – 2011-10-22 (×5): 100 mg via ORAL
  Filled 2011-10-18 (×5): qty 2

## 2011-10-18 MED ORDER — ASPIRIN EC 81 MG PO TBEC
81.0000 mg | DELAYED_RELEASE_TABLET | Freq: Every day | ORAL | Status: DC
  Administered 2011-10-19 – 2011-10-22 (×4): 81 mg via ORAL
  Filled 2011-10-18 (×4): qty 1

## 2011-10-18 MED ORDER — NICOTINE 14 MG/24HR TD PT24
14.0000 mg | MEDICATED_PATCH | TRANSDERMAL | Status: AC
  Administered 2011-10-18: 14 mg via TRANSDERMAL
  Filled 2011-10-18: qty 1

## 2011-10-18 MED ORDER — EPTIFIBATIDE BOLUS VIA INFUSION
180.0000 ug/kg | Freq: Once | INTRAVENOUS | Status: AC
  Administered 2011-10-18: 15500 ug via INTRAVENOUS
  Filled 2011-10-18: qty 21

## 2011-10-18 MED ORDER — EPTIFIBATIDE 75 MG/100ML IV SOLN
2.0000 ug/kg/min | INTRAVENOUS | Status: DC
  Administered 2011-10-18 – 2011-10-20 (×6): 2 ug/kg/min via INTRAVENOUS
  Filled 2011-10-18 (×10): qty 100

## 2011-10-18 MED ORDER — ALPRAZOLAM 0.5 MG PO TABS
0.5000 mg | ORAL_TABLET | Freq: Three times a day (TID) | ORAL | Status: DC | PRN
  Administered 2011-10-18 – 2011-10-19 (×2): 0.5 mg via ORAL
  Filled 2011-10-18 (×2): qty 1

## 2011-10-18 MED ORDER — BIOTENE DRY MOUTH MT LIQD
15.0000 mL | Freq: Two times a day (BID) | OROMUCOSAL | Status: DC
  Administered 2011-10-19 – 2011-10-22 (×6): 15 mL via OROMUCOSAL

## 2011-10-18 MED ORDER — ACETAMINOPHEN 325 MG PO TABS: 650.0000 mg | ORAL_TABLET | ORAL | Status: DC | PRN

## 2011-10-18 MED ORDER — NITROGLYCERIN IN D5W 200-5 MCG/ML-% IV SOLN
5.0000 ug/min | INTRAVENOUS | Status: DC
  Administered 2011-10-18: 5 ug/min via INTRAVENOUS
  Filled 2011-10-18: qty 250

## 2011-10-18 MED ORDER — FLUTICASONE-SALMETEROL 100-50 MCG/DOSE IN AEPB
1.0000 | INHALATION_SPRAY | Freq: Two times a day (BID) | RESPIRATORY_TRACT | Status: DC
  Administered 2011-10-18 – 2011-10-22 (×8): 1 via RESPIRATORY_TRACT
  Filled 2011-10-18: qty 14

## 2011-10-18 MED ORDER — HEPARIN SOD (PORCINE) IN D5W 100 UNIT/ML IV SOLN
1850.0000 [IU]/h | INTRAVENOUS | Status: DC
  Administered 2011-10-18 (×2): 1000 [IU]/h via INTRAVENOUS
  Administered 2011-10-20: 1850 [IU]/h via INTRAVENOUS
  Administered 2011-10-20: 1400 [IU]/h via INTRAVENOUS
  Administered 2011-10-20: 1650 [IU]/h via INTRAVENOUS
  Filled 2011-10-18 (×6): qty 250

## 2011-10-18 MED ORDER — LOSARTAN POTASSIUM-HCTZ 100-12.5 MG PO TABS: 1.0000 | ORAL_TABLET | Freq: Every day | ORAL | Status: DC

## 2011-10-18 MED ORDER — PNEUMOCOCCAL VAC POLYVALENT 25 MCG/0.5ML IJ INJ
0.5000 mL | INJECTION | INTRAMUSCULAR | Status: AC
  Administered 2011-10-19: 0.5 mL via INTRAMUSCULAR
  Filled 2011-10-18: qty 0.5

## 2011-10-18 MED ORDER — LEVALBUTEROL HCL 0.63 MG/3ML IN NEBU
0.6300 mg | INHALATION_SOLUTION | Freq: Four times a day (QID) | RESPIRATORY_TRACT | Status: DC | PRN
  Filled 2011-10-18: qty 3

## 2011-10-18 MED ORDER — INFLUENZA VIRUS VACC SPLIT PF IM SUSP
0.5000 mL | INTRAMUSCULAR | Status: AC
  Administered 2011-10-19: 0.5 mL via INTRAMUSCULAR
  Filled 2011-10-18: qty 0.5

## 2011-10-18 MED ORDER — ONDANSETRON HCL 4 MG/2ML IJ SOLN: 4.0000 mg | Freq: Four times a day (QID) | INTRAMUSCULAR | Status: DC | PRN

## 2011-10-18 MED ORDER — ONDANSETRON HCL 4 MG/2ML IJ SOLN
INTRAMUSCULAR | Status: AC
  Filled 2011-10-18: qty 2

## 2011-10-18 MED ORDER — ASPIRIN 81 MG PO CHEW
324.0000 mg | CHEWABLE_TABLET | ORAL | Status: AC
  Administered 2011-10-18: 324 mg via ORAL
  Filled 2011-10-18: qty 4

## 2011-10-18 MED ORDER — SODIUM CHLORIDE 0.9 % IJ SOLN
3.0000 mL | Freq: Two times a day (BID) | INTRAMUSCULAR | Status: DC
  Administered 2011-10-18 – 2011-10-22 (×5): 3 mL via INTRAVENOUS

## 2011-10-18 MED ORDER — RANOLAZINE ER 500 MG PO TB12
500.0000 mg | ORAL_TABLET | Freq: Two times a day (BID) | ORAL | Status: DC
  Administered 2011-10-18 – 2011-10-22 (×8): 500 mg via ORAL
  Filled 2011-10-18 (×9): qty 1

## 2011-10-18 MED ORDER — LISINOPRIL 20 MG PO TABS: 20.0000 mg | ORAL_TABLET | Freq: Every day | ORAL | Status: DC

## 2011-10-18 MED ORDER — SODIUM CHLORIDE 0.9 % IV SOLN
INTRAVENOUS | Status: DC
  Administered 2011-10-18: 22:00:00 via INTRAVENOUS

## 2011-10-18 MED ORDER — HEPARIN BOLUS VIA INFUSION
4000.0000 [IU] | Freq: Once | INTRAVENOUS | Status: AC
  Administered 2011-10-18: 4000 [IU] via INTRAVENOUS
  Filled 2011-10-18: qty 4000

## 2011-10-18 MED ORDER — HYDROCHLOROTHIAZIDE 12.5 MG PO CAPS
12.5000 mg | ORAL_CAPSULE | Freq: Every day | ORAL | Status: DC
  Administered 2011-10-18 – 2011-10-19 (×2): 12.5 mg via ORAL
  Filled 2011-10-18 (×3): qty 1

## 2011-10-18 MED ORDER — CLOPIDOGREL BISULFATE 75 MG PO TABS
75.0000 mg | ORAL_TABLET | Freq: Every day | ORAL | Status: DC
  Administered 2011-10-18 – 2011-10-22 (×5): 75 mg via ORAL
  Filled 2011-10-18 (×5): qty 1

## 2011-10-18 MED ORDER — NITROGLYCERIN 0.4 MG SL SUBL: 0.4000 mg | SUBLINGUAL_TABLET | SUBLINGUAL | Status: DC | PRN

## 2011-10-18 MED ORDER — METOPROLOL SUCCINATE ER 50 MG PO TB24
50.0000 mg | ORAL_TABLET | Freq: Two times a day (BID) | ORAL | Status: DC
  Administered 2011-10-18 – 2011-10-22 (×8): 50 mg via ORAL
  Filled 2011-10-18 (×9): qty 1

## 2011-10-18 MED ORDER — NICOTINE 14 MG/24HR TD PT24
14.0000 mg | MEDICATED_PATCH | Freq: Every day | TRANSDERMAL | Status: DC
Start: 2011-10-18 — End: 2011-10-22
  Administered 2011-10-19 – 2011-10-22 (×4): 14 mg via TRANSDERMAL
  Filled 2011-10-18 (×5): qty 1

## 2011-10-18 NOTE — ED Provider Notes (Signed)
Pt seen with PA Here for CP earlier today, none at this time Already took NTG/ASA EKG reviewed Will consult cardiology BP 135/104  Pulse 84  Temp(Src) 97.5 F (36.4 C) (Oral)  Resp 26  SpO2 97%   Joya Gaskins, MD 10/18/11 1622

## 2011-10-18 NOTE — Progress Notes (Signed)
PHARMACY - ANTICOAGULATION CONSULT INITIAL NOTE  Pharmacy Consult for: Heparin, Integrilin  Indication: chest pain/ACS    Patient Data:   Allergies: Allergies  Allergen Reactions  . Codeine Nausea And Vomiting    Pt states when he takes codeine, he throws up blood  . Niacin And Related     "made his body feel hot & he had a hard time breathing"    Patient Measurements: Height: 5\' 8"  (172.7 cm) Weight: 190 lb (86.183 kg) IBW/kg (Calculated) : 68.4  Adjusted Body Weight: 86 kg  Vital Signs: Temp:  [97.5 F (36.4 C)] 97.5 F (36.4 C) (01/04 1430) Pulse Rate:  [70-84] 82  (01/04 1904) Resp:  [14-26] 19  (01/04 1904) BP: (135-140)/(77-104) 140/77 mmHg (01/04 1904) SpO2:  [91 %-97 %] 91 % (01/04 1904) Weight:  [190 lb (86.183 kg)] 190 lb (86.183 kg) (01/04 1904)  Intake/Output from previous day: No intake or output data in the 24 hours ending 10/18/11 1920  Labs:  [facility] 10/18/11 1532  HGB 14.8  HCT 43.5  PLT 296  APTT --  LABPROT --  INR --  HEPARINUNFRC --  CREATININE 1.09  CKTOTAL --  CKMB --  TROPONINI --   Estimated Creatinine Clearance: 75 ml/min (by C-G formula based on Cr of 1.09).  Medical History: Past Medical History  Diagnosis Date  . Coronary artery disease   . Hypertension     Scheduled medications:     . aspirin  324 mg Oral NOW  . aspirin EC  81 mg Oral Daily  . busPIRone  5 mg Oral BID  . clopidogrel  75 mg Oral Daily  . famotidine  20 mg Oral Daily  . Fluticasone-Salmeterol  1 puff Inhalation Q12H  . losartan-hydrochlorothiazide  1 tablet Oral Daily  . metoprolol  50 mg Oral BID  . nicotine  14 mg Transdermal Daily  . ranolazine  500 mg Oral BID  . tiotropium  18 mcg Inhalation Daily  . DISCONTD: lisinopril  20 mg Oral Daily  . DISCONTD: ondansetron         Assessment:  61 y.o. male admitted on 10/18/2011, with ACS. Pharmacy consulted to manage IV heparin and Integrilin.  Goal of Therapy:  1. Heparin level 0.3-0.5  Plan:   1. Heparin IV bolus of  4000 units x 1, then IV infusion of 1000 units/hr.  2. CBC, heparin level 6 hours after starting heparin. 3. Integrilin IV 180 mcg/kg bolus x 1, then 2 mcg/kg/min Integrilin IV infusion.   Dineen Kid Thad Ranger, PharmD 10/18/2011, 7:20 PM

## 2011-10-18 NOTE — H&P (Signed)
Patient ID: Troy [name] MRN: [id number], DOB/AGE: 1951/01/18   Admit date: 10/18/2011   Primary Physician: Bluford Main, MD, MD Primary Cardiologist: Dr Tresa Endo  HPI: Troy [name] is a 61 year old male followed by Dr. Tresa Endo with a history of coronary disease. He had bypass grafting x4 in March of 2006. He had an LIMA to the LAD, SVG to the diagonal, SVG to the OM, and SVG to the PDA. In November 2010 he presented with unstable angina. He underwent SVG to PDA stenting and 2 days later a staged angioplasty to the diagonal. He has seen Dr. Tresa Endo in followup. The patient says he had a Myoview in the office earlier this year and Dr. Tresa Endo thought it was suspicious for progression of coronary disease. Symptomatically the patient seemed be doing okay at that time. He now presents to the emergency room after having an episode of substernal chest pain that radiated to his neck and then into his jaw. This was associated with diaphoresis weakness and shortness of breath. He did get some relief with nitroglycerin x2. The patient's wife indicates he's actually been having chest pain off and on for the last 2 weeks and using nitroglycerin. He is currently pain-free. His EKG does show some new T-wave inversion in lead 1 aVL and V4 and 5. He is admitted now for further evaluation.   Problem List: Past Medical History  Diagnosis Date  . Coronary artery disease   . Hypertension     Past Surgical History  Procedure Date  . Cardiac surgery      Allergies:  Allergies  Allergen Reactions  . Codeine Nausea And Vomiting    Pt states when he takes codeine, he throws up blood  . Niacin And Related     "made his body feel hot & he had a hard time breathing"     Home Medications  (Not in a hospital admission)   Family History  Problem Relation Age of Onset  . Heart attack Father 58     History   Social History  . Marital Status: Married    Spouse Name: N/A    Number of Children: N/A  . Years  of Education: N/A   Occupational History  . Not on file.   Social History Main Topics  . Smoking status: Current Everyday Smoker -- 2.0 packs/day for 50 years    Types: Cigarettes  . Smokeless tobacco: Current User    Types: Chew  . Alcohol Use: No  . Drug Use:   . Sexually Active:    Other Topics Concern  . Not on file   Social History Narrative  . No narrative on file     Review of Systems: General: negative for chills, fever, night sweats or weight changes.  Cardiovascular: negative for chest pain, dyspnea on exertion, edema, orthopnea, palpitations, paroxysmal nocturnal dyspnea or shortness of breath Dermatological: negative for rash Respiratory: negative for cough or wheezing Urologic: negative for hematuria Abdominal: negative for nausea, vomiting, diarrhea, bright red blood per rectum, melena, or hematemesis Neurologic: negative for visual changes, syncope, or dizziness All other systems reviewed and are otherwise negative except as noted above.  Physical Exam: Blood pressure 138/77, pulse 70, temperature 97.5 F (36.4 C), temperature source Oral, resp. rate 19, SpO2 96.00%.  General appearance: alert, cooperative and no distress Neck: no adenopathy, no carotid bruit, no JVD, supple, symmetrical, trachea midline and thyroid not enlarged, symmetric, no tenderness/mass/nodules Lungs: clear to auscultation bilaterally Heart: regular rate and rhythm Abdomen: soft,  non-tender; bowel sounds normal; no masses,  no organomegaly Extremities: extremities normal, atraumatic, no cyanosis or edema Pulses: diminished lower extremity pulses Skin: Skin color, texture, turgor normal. No rashes or lesions Neurologic: Grossly normal    Labs:   Results for orders placed during the hospital encounter of 10/18/11 (from the past 24 hour(s))  CBC     Status: Abnormal   Collection Time   10/18/11  3:32 PM      Component Value Range   WBC 14.7 (*) 4.0 - 10.5 (Troy/uL)   RBC 4.79  4.22 -  5.81 (MIL/uL)   Hemoglobin 14.8  13.0 - 17.0 (g/dL)   HCT 45.4  09.8 - 11.9 (%)   MCV 90.8  78.0 - 100.0 (fL)   MCH 30.9  26.0 - 34.0 (pg)   MCHC 34.0  30.0 - 36.0 (g/dL)   RDW 14.7  82.9 - 56.2 (%)   Platelets 296  150 - 400 (Troy/uL)  BASIC METABOLIC PANEL     Status: Abnormal   Collection Time   10/18/11  3:32 PM      Component Value Range   Sodium 138  135 - 145 (mEq/L)   Potassium 4.1  3.5 - 5.1 (mEq/L)   Chloride 100  96 - 112 (mEq/L)   CO2 32  19 - 32 (mEq/L)   Glucose, Bld 117 (*) 70 - 99 (mg/dL)   BUN 10  6 - 23 (mg/dL)   Creatinine, Ser 1.30  0.50 - 1.35 (mg/dL)   Calcium 9.6  8.4 - 86.5 (mg/dL)   GFR calc non Af Amer 71 (*) >90 (mL/min)   GFR calc Af Amer 82 (*) >90 (mL/min)  POCT I-STAT TROPONIN I     Status: Normal   Collection Time   10/18/11  3:46 PM      Component Value Range   Troponin i, poc 0.00  0.00 - 0.08 (ng/mL)   Comment 3              Radiology/Studies: Dg Chest 2 View  10/18/2011  *RADIOLOGY REPORT*  Clinical Data: Chest pain and shortness of breath.  CHEST - 2 VIEW  Comparison: 11/20/2010  Findings: Two views of the chest were obtained.  The patient is post CABG procedure.  There is gas near the GE junction and cannot exclude a small hiatal hernia.  There is a stable linear density in the left mid lung that may represent scarring.  No evidence for focal airspace disease or edema.  Heart size is stable.  Trachea is midline.  Bony structures are intact.  IMPRESSION: No acute chest findings.  Original Report Authenticated By: Richarda Overlie, M.D.    EKG:NSR with TWI 1, AVL, V4 and V5 (new)  ASSESSMENT AND PLAN:   Principal Problem:  *Unstable angina  Active Problems:  S/P CABG x 4, 2006. PCI SVG-PDA and POBA to DX 11/10  HTN (hypertension)  Dyslipidemia  COPD (chronic obstructive pulmonary disease), 2ppd smoker  Cardiomyopathy, ischemic, EF 40-45% 2D 11/10  Plan- admit to CCU, start heparin, NTG, Integrelin. Discussed with Dr Royann Shivers. Cath  Monday.  Troy Pretty, PA-C 10/18/2011, 6:32 PM  I have seen and examined the patient along with Corine Shelter, PA.  I have reviewed the chart, notes and new data.  I agree with PA's note.  Symptoms of classic unstable angina and new ECG changes. No overt CHF. PLAN: Will almost certainly need invasive evaluation/angiography. Meanwhile treat aggressively for acute coronary sd. With ASA, plavix, integrelin, heparin, beta  blockers, nitrates. Will need nicotine replacement therapy.  Thurmon Fair, MD, [facility] [phone]

## 2011-10-18 NOTE — ED Notes (Signed)
Pt to ED via EMS with c/o chest pain and weakness.  Pt also with nausea.

## 2011-10-18 NOTE — ED Provider Notes (Signed)
History     CSN: [id number]  Arrival date & time 10/18/11  1420   First MD Initiated Contact with Patient 10/18/11 1516      Chief Complaint  Patient presents with  . Chest Pain  . Nausea    (Consider location/radiation/quality/duration/timing/severity/associated sxs/prior treatment) Patient is a 61 y.o. male presenting with chest pain. The history is provided by the patient.  Chest Pain The chest pain began 3 - 5 hours ago. The chest pain is resolved. The pain is associated with exertion. At its most intense, the pain is at 7/10. The pain is currently at 0/10. The severity of the pain is moderate. The quality of the pain is described as pressure-like. The pain radiates to the right jaw. Chest pain is worsened by exertion. Primary symptoms include fatigue, shortness of breath, nausea and dizziness. Pertinent negatives for primary symptoms include no syncope, no cough, no wheezing, no palpitations, no abdominal pain and no vomiting.  Dizziness also occurs with nausea and weakness. Dizziness does not occur with vomiting.   Associated symptoms include weakness. He tried nitroglycerin and aspirin for the symptoms.  His past medical history is significant for CAD and hypertension.   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 States today's episode improved with 2 SL nitroglycerins. Denies any CP currently. States hx of CAD, bypass, multiple stents.   Past Medical History  Diagnosis Date  . Coronary artery disease   . Hypertension     Past Surgical History  Procedure Date  . Cardiac surgery     History reviewed. No pertinent family history.  History  Substance Use Topics  . Smoking status: Current Everyday Smoker -- 2.0 packs/day for 50 years    Types: Cigarettes  . Smokeless tobacco: Current User    Types: Chew  . Alcohol Use: No      Review  of Systems  Constitutional: Positive for fatigue.  HENT: Negative.   Eyes: Negative.   Respiratory: Positive for chest tightness and shortness of breath. Negative for cough and wheezing.   Cardiovascular: Positive for chest pain. Negative for palpitations, leg swelling and syncope.  Gastrointestinal: Positive for nausea. Negative for vomiting and abdominal pain.  Genitourinary: Negative.   Musculoskeletal: Negative.   Skin: Negative.   Neurological: Positive for dizziness and weakness.  Psychiatric/Behavioral: Negative.     Allergies  Codeine and Niacin and related  Home Medications   Current Outpatient Rx  Name Route Sig Dispense Refill  . BUSPIRONE HCL 5 MG PO TABS Oral Take 5 mg by mouth 2 (two) times daily.     [name] CLOPIDOGREL BISULFATE 75 MG PO TABS Oral Take 75 mg by mouth daily.      [name] FAMOTIDINE 20 MG PO TABS Oral Take 20 mg by mouth daily.     [name] FLUTICASONE-SALMETEROL 100-50 MCG/DOSE IN AEPB Inhalation Inhale 1 puff into the lungs every 12 (twelve) hours.      [name] LISINOPRIL 20 MG PO TABS Oral Take 20 mg by mouth daily.      [name] LOSARTAN POTASSIUM-HCTZ 100-12.5 MG PO TABS Oral Take 1 tablet by mouth daily.      [name] METOPROLOL SUCCINATE ER 100 MG PO TB24 Oral Take 50 mg by mouth 2 (two) times daily.      [name] RANOLAZINE ER 500 MG PO TB12 Oral Take 500 mg by mouth 2 (  two) times daily.      [name] TIOTROPIUM BROMIDE MONOHYDRATE 18 MCG IN CAPS Inhalation Place 18 mcg into inhaler and inhale daily.        BP 135/104  Pulse 84  Temp(Src) 97.5 F (36.4 C) (Oral)  Resp 26  SpO2 97%  Physical Exam  Nursing note and vitals reviewed. Constitutional: He is oriented to person, place, and time. He appears well-developed and well-nourished. No distress.  HENT:  Head: Normocephalic and atraumatic.  Eyes: Conjunctivae are normal.  Neck: Neck supple.  Cardiovascular: Normal rate, regular rhythm and normal heart sounds.   Pulmonary/Chest: Effort normal. He has wheezes.       Expiratory wheezes  in all lung fields  Abdominal: Soft. Bowel sounds are normal. He exhibits no distension. There is no tenderness.  Musculoskeletal: Normal range of motion. He exhibits no edema.  Neurological: He is alert and oriented to person, place, and time.  Skin: Skin is warm and dry.  Psychiatric: He has a normal mood and affect.    ED Course  Procedures (including critical care time)  3:35 PM Pt seen and examined by me. Pt with Hx of CAD. Increased episodes of exertional CP, today radiated into right jaw, state felt like when he had his 2nd heart attack. Took 325mg  asa today. Took 2sl nitros. Pain resolved currently. ECG unremarkable. Will get labs and monitor.    Date: 10/18/2011  Rate: 85  Rhythm: normal sinus rhythm  QRS Axis: normal  Intervals: normal  ST/T Wave abnormalities: nonspecific T wave changes  Conduction Disutrbances:none  Narrative Interpretation:   Old EKG Reviewed: unchanged  Negative first troponin. CP very concerning for unstable angina. Will admit. Spoke with [facility] will come see in ED.  MDM         Lottie Mussel, PA 10/19/11 [phone]

## 2011-10-19 LAB — CARDIAC PANEL(CRET KIN+CKTOT+MB+TROPI)
CK, MB: 2.6 ng/mL (ref 0.3–4.0)
Relative Index: INVALID (ref 0.0–2.5)
Relative Index: INVALID (ref 0.0–2.5)
Total CK: 91 U/L (ref 7–232)
Total CK: 79 U/L (ref 7–232)
Troponin I: <0.3 ng/mL (ref <0.30)

## 2011-10-19 LAB — BASIC METABOLIC PANEL
CO2: 29 mEq/L (ref 19–32)
Calcium: 9.2 mg/dL (ref 8.4–10.5)
Chloride: 99 mEq/L (ref 96–112)
Creatinine, Ser: 1.08 mg/dL (ref 0.50–1.35)
Glucose, Bld: 119 mg/dL — ABNORMAL HIGH (ref 70–99)

## 2011-10-19 LAB — HEPARIN LEVEL (UNFRACTIONATED): Heparin Unfractionated: 0.22 IU/mL — ABNORMAL LOW (ref 0.30–0.70)

## 2011-10-19 LAB — CBC
HCT: 43.6 % (ref 39.0–52.0)
Hemoglobin: 14.4 g/dL (ref 13.0–17.0)
MCV: 90.8 fL (ref 78.0–100.0)
RBC: 4.8 MIL/uL (ref 4.22–5.81)
RDW: 13.5 % (ref 11.5–15.5)
WBC: 13.1 10*3/uL — ABNORMAL HIGH (ref 4.0–10.5)

## 2011-10-19 LAB — HEMOGLOBIN A1C
Hgb A1c MFr Bld: 6.2 % — ABNORMAL HIGH (ref <5.7)
Mean Plasma Glucose: 131 mg/dL — ABNORMAL HIGH (ref <117)

## 2011-10-19 LAB — TSH: TSH: 2.776 u[IU]/mL (ref 0.350–4.500)

## 2011-10-19 NOTE — Progress Notes (Signed)
ANTICOAGULATION CONSULT NOTE - Follow Up Consult  Pharmacy Consult for heparin/Integrilin Indication: chest pain/ACS  Allergies  Allergen Reactions  . Codeine Nausea And Vomiting    Pt states when he takes codeine, he throws up blood  . Niacin And Related     "made his body feel hot & he had a hard time breathing"    Patient Measurements: Height: 5\' 8"  (172.7 cm) Weight: 195 lb 1.7 oz (88.5 kg) IBW/kg (Calculated) : 68.4   Vital Signs: Temp: 97.6 F (36.4 C) (01/05 0349) Temp src: Oral (01/05 0349) BP: 123/73 mmHg (01/05 0400) Pulse Rate: 87  (01/04 2115)  Labs:  Basename 10/19/11 0200 10/19/11 0158 10/18/11 1910 10/18/11 1532  HGB -- 14.4 14.7 --  HCT -- 43.6 43.0 43.5  PLT -- 270 309 296  APTT -- -- 29 --  LABPROT -- -- 12.6 --  INR -- -- 0.92 --  HEPARINUNFRC -- 0.42 -- --  CREATININE -- 1.08 1.01 1.09  CKTOTAL 91 -- 96 --  CKMB 2.6 -- 2.8 --  TROPONINI <0.30 -- <0.30 --   Estimated Creatinine Clearance: 76.6 ml/min (by C-G formula based on Cr of 1.08).   Medications:  Scheduled:    . active partnership for health of your heart book   Does not apply Once  . antiseptic oral rinse  15 mL Mouth Rinse BID  . aspirin  324 mg Oral NOW  . aspirin EC  81 mg Oral Daily  . busPIRone  5 mg Oral BID  . clopidogrel  75 mg Oral Daily  . eptifibatide  180 mcg/kg Intravenous Once  . famotidine  20 mg Oral Daily  . Fluticasone-Salmeterol  1 puff Inhalation Q12H  . heparin  4,000 Units Intravenous Once  . losartan  100 mg Oral Daily   And  . hydrochlorothiazide  12.5 mg Oral Daily  . influenza  inactive virus vaccine  0.5 mL Intramuscular Tomorrow-1000  . metoprolol  50 mg Oral BID  . nicotine  14 mg Transdermal Daily  . nicotine  14 mg Transdermal To Major  . pneumococcal 23 valent vaccine  0.5 mL Intramuscular Tomorrow-1000  . ranolazine  500 mg Oral BID  . sodium chloride  3 mL Intravenous Q12H  . tiotropium  18 mcg Inhalation Daily  . DISCONTD: lisinopril  20  mg Oral Daily  . DISCONTD: losartan-hydrochlorothiazide  1 tablet Oral Daily  . DISCONTD: ondansetron       Infusions:    . sodium chloride 10 mL/hr at 10/18/11 2144  . eptifibatide 2 mcg/kg/min (10/18/11 2100)  . heparin 10 mL/hr (10/18/11 2100)  . nitroGLYCERIN 5 mcg/min (10/18/11 2100)    Assessment: 61yo male therapeutic on heparin with initial dosing for ACS; also on Integrilin.  CBC basically stable.  Goal of Therapy:  Heparin level 0.3-0.5   Plan:  Will continue heparin gtt at current rate and confirm stable with additional level.  Continue to follow CBC.  Colleen Can PharmD BCPS 10/19/2011,5:16 AM

## 2011-10-19 NOTE — Progress Notes (Signed)
Subjective:  No chest pain  Objective:  Vital Signs in the last 24 hours: Temp:  [97.5 F (36.4 C)-97.9 F (36.6 C)] 97.6 F (36.4 C) (01/05 0349) Pulse Rate:  [70-89] 87  (01/04 2115) Resp:  [9-26] 14  (01/05 0000) BP: (118-174)/(64-104) 123/73 mmHg (01/05 0400) SpO2:  [91 %-97 %] 97 % (01/05 0500) Weight:  [86.183 kg (190 lb)-88.5 kg (195 lb 1.7 oz)] 195 lb 1.7 oz (88.5 kg) (01/04 2100)  Intake/Output from previous day:  Intake/Output Summary (Last 24 hours) at 10/19/11 0610 Last data filed at 10/19/11 0500  Gross per 24 hour  Intake 342.65 ml  Output   1050 ml  Net -707.35 ml    Physical Exam: General appearance: alert, cooperative and no distress Lungs: few rhonchi Heart: regular rate and rhythm   Rate: 72  Rhythm: normal sinus rhythm  Lab Results:  Basename 10/19/11 0158 10/18/11 1910  WBC 13.1* 13.3*  HGB 14.4 14.7  PLT 270 309    Basename 10/19/11 0158 10/18/11 1910  NA 136 137  K 3.8 4.3  CL 99 100  CO2 29 28  GLUCOSE 119* 91  BUN 12 11  CREATININE 1.08 1.01    Basename 10/19/11 0200 10/18/11 1910  TROPONINI <0.30 <0.30   Hepatic Function Panel  Basename 10/18/11 1910  PROT 7.1  ALBUMIN 3.6  AST 20  ALT 19  ALKPHOS 67  BILITOT 0.4  BILIDIR --  IBILI --   No results found for this basename: CHOL in the last 72 hours  Basename 10/18/11 1910  INR 0.92    Imaging: Dg Chest 2 View  10/18/2011  *RADIOLOGY REPORT*  Clinical Data: Chest pain and shortness of breath.  CHEST - 2 VIEW  Comparison: 11/20/2010  Findings: Two views of the chest were obtained.  The patient is post CABG procedure.  There is gas near the GE junction and cannot exclude a small hiatal hernia.  There is a stable linear density in the left mid lung that may represent scarring.  No evidence for focal airspace disease or edema.  Heart size is stable.  Trachea is midline.  Bony structures are intact.  IMPRESSION: No acute chest findings.  Original Report Authenticated By: Richarda Overlie, M.D.    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  I have reviewed the chart, notes and new data.  I agree with PA/NP's note.  Key new complaints: No further angina, denies angina  Key examination changes: Diminished breath sounds throughout c/w emphysema Key new findings / data: Negative cardiac enzymes; subtle improvement in ECG (less prominent TWI in I and aVL).  PLAN: Cath emergently for persistent angina or ST elevation; otherwise as planned Monday.  Thurmon Fair, MD, [facility] [phone] 10/19/2011, 8:15 AM

## 2011-10-19 NOTE — Progress Notes (Signed)
ANTICOAGULATION CONSULT NOTE - Follow Up Consult  Pharmacy Consult for Heparin/Integrilin Indication: chest pain/ACS  Assessment: 61yo male on heparin for ACS; also on Integrilin. Confirmatory heparin level subtherapeutic at 0.22. No bleeding noted.   Goal of Therapy:  Heparin level 0.3-0.5   Plan:  1. Increase heparin drip to 1100 units/hr (11 ml/hr)  2. Obtain HL 6 hours post drip change, timed collect   Thank you,  Brett Fairy, PharmD Pager: [phone]  10/19/2011 11:33 AM   Allergies  Allergen Reactions  . Codeine Nausea And Vomiting    Pt states when he takes codeine, he throws up blood  . Niacin And Related     "made his body feel hot & he had a hard time breathing"    Patient Measurements: Height: 5\' 8"  (172.7 cm) Weight: 195 lb 1.7 oz (88.5 kg) IBW/kg (Calculated) : 68.4   Vital Signs: Temp: 97.3 F (36.3 C) (01/05 0800) Temp src: Oral (01/05 0800) BP: 136/71 mmHg (01/05 0800)  Labs:  Basename 10/19/11 0914 10/19/11 0200 10/19/11 0158 10/18/11 1910 10/18/11 1532  HGB -- -- 14.4 14.7 --  HCT -- -- 43.6 43.0 43.5  PLT -- -- 270 309 296  APTT -- -- -- 29 --  LABPROT -- -- -- 12.6 --  INR -- -- -- 0.92 --  HEPARINUNFRC 0.22* -- 0.42 -- --  CREATININE -- -- 1.08 1.01 1.09  CKTOTAL 79 91 -- 96 --  CKMB 2.6 2.6 -- 2.8 --  TROPONINI <0.30 <0.30 -- <0.30 --   Estimated Creatinine Clearance: 76.6 ml/min (by C-G formula based on Cr of 1.08).  Medications:  Scheduled:    . active partnership for health of your heart book   Does not apply Once  . antiseptic oral rinse  15 mL Mouth Rinse BID  . aspirin  324 mg Oral NOW  . aspirin EC  81 mg Oral Daily  . busPIRone  5 mg Oral BID  . clopidogrel  75 mg Oral Daily  . eptifibatide  180 mcg/kg Intravenous Once  . famotidine  20 mg Oral Daily  . Fluticasone-Salmeterol  1 puff Inhalation Q12H  . heparin  4,000 Units Intravenous Once  . losartan  100 mg Oral Daily   And  . hydrochlorothiazide  12.5 mg Oral Daily   . influenza  inactive virus vaccine  0.5 mL Intramuscular Tomorrow-1000  . metoprolol  50 mg Oral BID  . nicotine  14 mg Transdermal Daily  . nicotine  14 mg Transdermal To Major  . pneumococcal 23 valent vaccine  0.5 mL Intramuscular Tomorrow-1000  . ranolazine  500 mg Oral BID  . sodium chloride  3 mL Intravenous Q12H  . tiotropium  18 mcg Inhalation Daily  . DISCONTD: lisinopril  20 mg Oral Daily  . DISCONTD: losartan-hydrochlorothiazide  1 tablet Oral Daily  . DISCONTD: ondansetron       Infusions:    . sodium chloride 10 mL/hr at 10/18/11 2144  . eptifibatide 2 mcg/kg/min (10/19/11 0800)  . heparin 10 mL/hr (10/18/11 2100)  . nitroGLYCERIN 5 mcg/min (10/18/11 2100)   PRN: acetaminophen, ALPRAZolam, levalbuterol, nitroGLYCERIN, ondansetron (ZOFRAN) IV, zolpidem  Romell Cavanah N 10/19/2011,11:30 AM

## 2011-10-19 NOTE — Progress Notes (Signed)
ANTICOAGULATION CONSULT NOTE - Follow Up Consult  Pharmacy Consult for Heparin Indication: chest pain/ACS  Assessment:  61 y.o. M on heparin/integrillin for ACS sx with a SUBtherapeutic HL this evening though drawn early (HL 0.21, goal of 0.3-0.5). Level was drawn ~3.5 hrs after rate change so hard to interpret however likely still low.  No s/sx of bleeding noted.  Will increase slightly and recheck level.  Goal of Therapy:  Heparin level 0.3-0.5 (while concurrently on Integrillin)  Plan:  1) Increase heparin drip to 1200 units/hr (12 ml/hr)  2) Will continue to monitor for any signs/symptoms of bleeding and will follow up with heparin level in 6 hours   Georgina Pillion, PharmD, BCPS 10/19/2011 7:41 PM      Allergies  Allergen Reactions  . Codeine Nausea And Vomiting    Pt states when he takes codeine, he throws up blood  . Niacin And Related     "made his body feel hot & he had a hard time breathing"    Patient Measurements: Height: 5\' 8"  (172.7 cm) Weight: 195 lb 1.7 oz (88.5 kg) IBW/kg (Calculated) : 68.4  Heparin Dosing Weight: 86.4 kg  Vital Signs: Temp: 97.6 F (36.4 C) (01/05 1605) Temp src: Oral (01/05 1605) BP: 138/90 mmHg (01/05 1900)  Labs:  Basename 10/19/11 1826 10/19/11 1433 10/19/11 0914 10/19/11 0200 10/19/11 0158 10/18/11 1910 10/18/11 1532  HGB -- -- -- -- 14.4 14.7 --  HCT -- -- -- -- 43.6 43.0 43.5  PLT -- -- -- -- 270 309 296  APTT -- -- -- -- -- 29 --  LABPROT -- -- -- -- -- 12.6 --  INR -- -- -- -- -- 0.92 --  HEPARINUNFRC 0.21* -- 0.22* -- 0.42 -- --  CREATININE -- -- -- -- 1.08 1.01 1.09  CKTOTAL -- 74 79 91 -- -- --  CKMB -- 2.4 2.6 2.6 -- -- --  TROPONINI -- <0.30 <0.30 <0.30 -- -- --   Estimated Creatinine Clearance: 76.6 ml/min (by C-G formula based on Cr of 1.08).

## 2011-10-20 LAB — CBC
HCT: 43.7 % (ref 39.0–52.0)
Hemoglobin: 14.9 g/dL (ref 13.0–17.0)
MCH: 30.8 pg (ref 26.0–34.0)
MCHC: 34.1 g/dL (ref 30.0–36.0)
MCV: 90.3 fL (ref 78.0–100.0)
RDW: 13.4 % (ref 11.5–15.5)

## 2011-10-20 LAB — HEPARIN LEVEL (UNFRACTIONATED)
Heparin Unfractionated: 0.21 IU/mL — ABNORMAL LOW (ref 0.30–0.70)
Heparin Unfractionated: 0.24 IU/mL — ABNORMAL LOW (ref 0.30–0.70)

## 2011-10-20 MED ORDER — SODIUM CHLORIDE 0.9 % IJ SOLN: 3.0000 mL | INTRAMUSCULAR | Status: DC | PRN

## 2011-10-20 MED ORDER — DIAZEPAM 5 MG PO TABS
5.0000 mg | ORAL_TABLET | ORAL | Status: AC
  Administered 2011-10-21: 5 mg via ORAL
  Filled 2011-10-20: qty 1

## 2011-10-20 MED ORDER — SODIUM CHLORIDE 0.9 % IV SOLN
1.0000 mL/kg/h | INTRAVENOUS | Status: DC
  Administered 2011-10-21 (×2): 1 mL/kg/h via INTRAVENOUS

## 2011-10-20 MED ORDER — ALPRAZOLAM 0.5 MG PO TABS
0.5000 mg | ORAL_TABLET | Freq: Three times a day (TID) | ORAL | Status: DC | PRN
  Administered 2011-10-20: 0.5 mg via ORAL
  Filled 2011-10-20: qty 1

## 2011-10-20 MED ORDER — SODIUM CHLORIDE 0.9 % IJ SOLN
3.0000 mL | Freq: Two times a day (BID) | INTRAMUSCULAR | Status: DC
  Administered 2011-10-21 – 2011-10-22 (×2): 3 mL via INTRAVENOUS

## 2011-10-20 MED ORDER — LISINOPRIL 20 MG PO TABS
20.0000 mg | ORAL_TABLET | Freq: Every day | ORAL | Status: DC
  Filled 2011-10-20: qty 1

## 2011-10-20 MED ORDER — SODIUM CHLORIDE 0.9 % IV SOLN: 250.0000 mL | INTRAVENOUS | Status: DC | PRN

## 2011-10-20 NOTE — Progress Notes (Signed)
Patient ID: Troy [name], male   DOB: December 17, 1950, 61 y.o.   MRN: [id number] [facility]  DAILY PROGRESS NOTE   Subjective:  Occasionally anxious, but no angina or dyspnea. Enzymes remain negative but ECG changes persist. Minor nose bleed occurred yesterday and stopped spontaneously.  Objective:  Temp:  [97.4 F (36.3 C)-98.1 F (36.7 C)] 97.7 F (36.5 C) (01/06 0400) Resp:  [18-20] 20  (01/06 0000) BP: (110-153)/(69-95) 110/69 mmHg (01/06 0500) SpO2:  [88 %-98 %] 94 % (01/06 0500) Weight:  [89.5 kg (197 lb 5 oz)] 197 lb 5 oz (89.5 kg) (01/06 0500) Weight change: 3.317 kg (7 lb 5 oz)  Intake/Output from previous day: 01/05 0701 - 01/06 0700 In: 838.2 [I.V.:838.2] Out: 2300 [Urine:2300]  Intake/Output from this shift:    Medications: Current Facility-Administered Medications  Medication Dose Route Frequency Provider Last Rate Last Dose  . 0.9 %  sodium chloride infusion   Intravenous Continuous Lennette Bihari, MD 10 mL/hr at 10/20/11 0600    . acetaminophen (TYLENOL) tablet 650 mg  650 mg Oral Q4H PRN Abelino Derrick, PA      . ALPRAZolam Prudy Feeler) tablet 0.5 mg  0.5 mg Oral Q8H PRN Abelino Derrick, PA   0.5 mg at 10/19/11 1559  . antiseptic oral rinse (BIOTENE) solution 15 mL  15 mL Mouth Rinse BID Lennette Bihari, MD   15 mL at 10/19/11 1935  . aspirin EC tablet 81 mg  81 mg Oral Daily Eda Paschal [place], [place]   81 mg at 10/19/11 1012  . busPIRone (BUSPAR) tablet 5 mg  5 mg Oral BID Eda Paschal [place], PA   5 mg at 10/19/11 2227  . clopidogrel (PLAVIX) tablet 75 mg  75 mg Oral Daily Eda Paschal [place], [place]   75 mg at 10/19/11 1011  . eptifibatide (INTEGRILIN) 75 mg / 100 mL infusion  2 mcg/kg/min Intravenous Continuous Emeline Gins, PHARMD 13.8 mL/hr at 10/20/11 0600 2.001 mcg/kg/min at 10/20/11 0600  . famotidine (PEPCID) tablet 20 mg  20 mg Oral Daily Eda Paschal [place], [place]   20 mg at 10/19/11 1011  . Fluticasone-Salmeterol (ADVAIR) 100-50 MCG/DOSE inhaler 1  puff  1 puff Inhalation Q12H Eda Paschal [place], [place]   1 puff at 10/19/11 1942  . heparin ADULT infusion 100 units/ml (25000 units/250 ml)  1,400 Units/hr Intravenous Continuous Colleen Can, PHARMD 14 mL/hr at 10/20/11 0600 1,400 Units/hr at 10/20/11 0600  . losartan (COZAAR) tablet 100 mg  100 mg Oral Daily Lennette Bihari, MD   100 mg at 10/19/11 1021   And  . hydrochlorothiazide (MICROZIDE) capsule 12.5 mg  12.5 mg Oral Daily Lennette Bihari, MD   12.5 mg at 10/19/11 1011  . influenza  inactive virus vaccine (FLUZONE/FLUARIX) injection 0.5 mL  0.5 mL Intramuscular Tomorrow-1000 Lennette Bihari, MD   0.5 mL at 10/19/11 1017  . levalbuterol (XOPENEX) nebulizer solution 0.63 mg  0.63 mg Nebulization Q6H PRN Abelino Derrick, PA      . metoprolol (TOPROL-XL) 24 hr tablet 50 mg  50 mg Oral BID Eda Paschal [place], PA   50 mg at 10/19/11 2228  . nicotine (NICODERM CQ - dosed in mg/24 hours) patch 14 mg  14 mg Transdermal Daily Abelino Derrick, PA   14 mg at 10/19/11 1011  . nicotine (NICODERM CQ - dosed in mg/24 hours) patch 14 mg  14 mg Transdermal To Major Emeline Gins, PHARMD  14 mg at 10/18/11 2045  . nitroGLYCERIN (NITROSTAT) SL tablet 0.4 mg  0.4 mg Sublingual Q5 min PRN Abelino Derrick, PA      . nitroGLYCERIN 0.2 mg/mL in dextrose 5 % infusion  5 mcg/min Intravenous Titrated Abelino Derrick, PA 1.5 mL/hr at 10/20/11 0600 5 mcg/min at 10/20/11 0600  . ondansetron (ZOFRAN) injection 4 mg  4 mg Intravenous Q6H PRN Abelino Derrick, PA      . pneumococcal 23 valent vaccine (PNU-IMMUNE) injection 0.5 mL  0.5 mL Intramuscular Tomorrow-1000 Lennette Bihari, MD   0.5 mL at 10/19/11 1015  . ranolazine (RANEXA) 12 hr tablet 500 mg  500 mg Oral BID Eda Paschal [place], PA   500 mg at 10/19/11 2227  . sodium chloride 0.9 % injection 3 mL  3 mL Intravenous Q12H Lennette Bihari, MD   3 mL at 10/19/11 2228  . tiotropium (SPIRIVA) inhalation capsule 18 mcg  18 mcg Inhalation Daily Eda Paschal [place], [place]   18 mcg at 10/19/11  1610  . zolpidem (AMBIEN) tablet 10 mg  10 mg Oral QHS PRN Abelino Derrick, PA   10 mg at 10/19/11 2228    Physical Exam: General appearance: alert, cooperative and no distress Neck: no adenopathy, no carotid bruit, no JVD, supple, symmetrical, trachea midline and thyroid not enlarged, symmetric, no tenderness/mass/nodules Lungs: diminished breath sounds bilaterally and wheezes bilaterally Heart: regular rate and rhythm, S1, S2 normal, no murmur, click, rub or gallop Abdomen: soft, non-tender; bowel sounds normal; no masses,  no organomegaly Extremities: extremities normal, atraumatic, no cyanosis or edema Pulses: 2+ and symmetric Skin: Skin color, texture, turgor normal. No rashes or lesions Neurologic: Alert and oriented X 3, normal strength and tone. Normal symmetric reflexes. Normal coordination and gait  Lab Results: Results for orders placed during the hospital encounter of 10/18/11 (from the past 48 hour(s))  CBC     Status: Abnormal   Collection Time   10/18/11  3:32 PM      Component Value Range Comment   WBC 14.7 (*) 4.0 - 10.5 (K/uL)    RBC 4.79  4.22 - 5.81 (MIL/uL)    Hemoglobin 14.8  13.0 - 17.0 (g/dL)    HCT 96.0  45.4 - 09.8 (%)    MCV 90.8  78.0 - 100.0 (fL)    MCH 30.9  26.0 - 34.0 (pg)    MCHC 34.0  30.0 - 36.0 (g/dL)    RDW 11.9  14.7 - 82.9 (%)    Platelets 296  150 - 400 (K/uL)   BASIC METABOLIC PANEL     Status: Abnormal   Collection Time   10/18/11  3:32 PM      Component Value Range Comment   Sodium 138  135 - 145 (mEq/L)    Potassium 4.1  3.5 - 5.1 (mEq/L)    Chloride 100  96 - 112 (mEq/L)    CO2 32  19 - 32 (mEq/L)    Glucose, Bld 117 (*) 70 - 99 (mg/dL)    BUN 10  6 - 23 (mg/dL)    Creatinine, Ser 5.62  0.50 - 1.35 (mg/dL)    Calcium 9.6  8.4 - 10.5 (mg/dL)    GFR calc non Af Amer 71 (*) >90 (mL/min)    GFR calc Af Amer 82 (*) >90 (mL/min)   POCT I-STAT TROPONIN I     Status: Normal   Collection Time   10/18/11  3:46 PM  Component Value Range  Comment   Troponin i, poc 0.00  0.00 - 0.08 (ng/mL)    Comment 3            CARDIAC PANEL(CRET KIN+CKTOT+MB+TROPI)     Status: Normal   Collection Time   10/18/11  7:10 PM      Component Value Range Comment   Total CK 96  7 - 232 (U/L)    CK, MB 2.8  0.3 - 4.0 (ng/mL)    Troponin I <0.30  <0.30 (ng/mL)    Relative Index RELATIVE INDEX IS INVALID  0.0 - 2.5    PROTIME-INR     Status: Normal   Collection Time   10/18/11  7:10 PM      Component Value Range Comment   Prothrombin Time 12.6  11.6 - 15.2 (seconds)    INR 0.92  0.00 - 1.49    APTT     Status: Normal   Collection Time   10/18/11  7:10 PM      Component Value Range Comment   aPTT 29  24 - 37 (seconds)   CBC     Status: Abnormal   Collection Time   10/18/11  7:10 PM      Component Value Range Comment   WBC 13.3 (*) 4.0 - 10.5 (K/uL)    RBC 4.77  4.22 - 5.81 (MIL/uL)    Hemoglobin 14.7  13.0 - 17.0 (g/dL)    HCT 16.1  09.6 - 04.5 (%)    MCV 90.1  78.0 - 100.0 (fL)    MCH 30.8  26.0 - 34.0 (pg)    MCHC 34.2  30.0 - 36.0 (g/dL)    RDW 40.9  81.1 - 91.4 (%)    Platelets 309  150 - 400 (K/uL)   DIFFERENTIAL     Status: Abnormal   Collection Time   10/18/11  7:10 PM      Component Value Range Comment   Neutrophils Relative 66  43 - 77 (%)    Neutro Abs 8.7 (*) 1.7 - 7.7 (K/uL)    Lymphocytes Relative 23  12 - 46 (%)    Lymphs Abs 3.0  0.7 - 4.0 (K/uL)    Monocytes Relative 10  3 - 12 (%)    Monocytes Absolute 1.3 (*) 0.1 - 1.0 (K/uL)    Eosinophils Relative 1  0 - 5 (%)    Eosinophils Absolute 0.2  0.0 - 0.7 (K/uL)    Basophils Relative 1  0 - 1 (%)    Basophils Absolute 0.1  0.0 - 0.1 (K/uL)   TSH     Status: Normal   Collection Time   10/18/11  7:10 PM      Component Value Range Comment   TSH 2.776  0.350 - 4.500 (uIU/mL)   COMPREHENSIVE METABOLIC PANEL     Status: Abnormal   Collection Time   10/18/11  7:10 PM      Component Value Range Comment   Sodium 137  135 - 145 (mEq/L)    Potassium 4.3  3.5 - 5.1 (mEq/L)     Chloride 100  96 - 112 (mEq/L)    CO2 28  19 - 32 (mEq/L)    Glucose, Bld 91  70 - 99 (mg/dL)    BUN 11  6 - 23 (mg/dL)    Creatinine, Ser 7.82  0.50 - 1.35 (mg/dL)    Calcium 9.4  8.4 - 10.5 (mg/dL)    Total Protein 7.1  6.0 -  8.3 (g/dL)    Albumin 3.6  3.5 - 5.2 (g/dL)    AST 20  0 - 37 (U/L) HEMOLYSIS AT THIS LEVEL MAY AFFECT RESULT   ALT 19  0 - 53 (U/L)    Alkaline Phosphatase 67  39 - 117 (U/L)    Total Bilirubin 0.4  0.3 - 1.2 (mg/dL)    GFR calc non Af Amer 78 (*) >90 (mL/min)    GFR calc Af Amer >90  >90 (mL/min)   MAGNESIUM     Status: Normal   Collection Time   10/18/11  7:10 PM      Component Value Range Comment   Magnesium 2.3  1.5 - 2.5 (mg/dL)   HEMOGLOBIN W1X     Status: Abnormal   Collection Time   10/18/11  7:10 PM      Component Value Range Comment   Hemoglobin A1C 6.2 (*) <5.7 (%)    Mean Plasma Glucose 131 (*) <117 (mg/dL)   MRSA PCR SCREENING     Status: Normal   Collection Time   10/18/11  9:02 PM      Component Value Range Comment   MRSA by PCR NEGATIVE  NEGATIVE    CBC     Status: Abnormal   Collection Time   10/19/11  1:58 AM      Component Value Range Comment   WBC 13.1 (*) 4.0 - 10.5 (K/uL)    RBC 4.80  4.22 - 5.81 (MIL/uL)    Hemoglobin 14.4  13.0 - 17.0 (g/dL)    HCT 91.4  78.2 - 95.6 (%)    MCV 90.8  78.0 - 100.0 (fL)    MCH 30.0  26.0 - 34.0 (pg)    MCHC 33.0  30.0 - 36.0 (g/dL)    RDW 21.3  08.6 - 57.8 (%)    Platelets 270  150 - 400 (K/uL)   HEPARIN LEVEL (UNFRACTIONATED)     Status: Normal   Collection Time   10/19/11  1:58 AM      Component Value Range Comment   Heparin Unfractionated 0.42  0.30 - 0.70 (IU/mL)   BASIC METABOLIC PANEL     Status: Abnormal   Collection Time   10/19/11  1:58 AM      Component Value Range Comment   Sodium 136  135 - 145 (mEq/L)    Potassium 3.8  3.5 - 5.1 (mEq/L)    Chloride 99  96 - 112 (mEq/L)    CO2 29  19 - 32 (mEq/L)    Glucose, Bld 119 (*) 70 - 99 (mg/dL)    BUN 12  6 - 23 (mg/dL)    Creatinine, Ser  4.69  0.50 - 1.35 (mg/dL)    Calcium 9.2  8.4 - 10.5 (mg/dL)    GFR calc non Af Amer 72 (*) >90 (mL/min)    GFR calc Af Amer 83 (*) >90 (mL/min)   CARDIAC PANEL(CRET KIN+CKTOT+MB+TROPI)     Status: Normal   Collection Time   10/19/11  2:00 AM      Component Value Range Comment   Total CK 91  7 - 232 (U/L)    CK, MB 2.6  0.3 - 4.0 (ng/mL)    Troponin I <0.30  <0.30 (ng/mL)    Relative Index RELATIVE INDEX IS INVALID  0.0 - 2.5    CARDIAC PANEL(CRET KIN+CKTOT+MB+TROPI)     Status: Normal   Collection Time   10/19/11  9:14 AM      Component Value  Range Comment   Total CK 79  7 - 232 (U/L)    CK, MB 2.6  0.3 - 4.0 (ng/mL)    Troponin I <0.30  <0.30 (ng/mL)    Relative Index RELATIVE INDEX IS INVALID  0.0 - 2.5    HEPARIN LEVEL (UNFRACTIONATED)     Status: Abnormal   Collection Time   10/19/11  9:14 AM      Component Value Range Comment   Heparin Unfractionated 0.22 (*) 0.30 - 0.70 (IU/mL)   CARDIAC PANEL(CRET KIN+CKTOT+MB+TROPI)     Status: Normal   Collection Time   10/19/11  2:33 PM      Component Value Range Comment   Total CK 74  7 - 232 (U/L)    CK, MB 2.4  0.3 - 4.0 (ng/mL)    Troponin I <0.30  <0.30 (ng/mL)    Relative Index RELATIVE INDEX IS INVALID  0.0 - 2.5    HEPARIN LEVEL (UNFRACTIONATED)     Status: Abnormal   Collection Time   10/19/11  6:26 PM      Component Value Range Comment   Heparin Unfractionated 0.21 (*) 0.30 - 0.70 (IU/mL)   HEPARIN LEVEL (UNFRACTIONATED)     Status: Abnormal   Collection Time   10/20/11  2:20 AM      Component Value Range Comment   Heparin Unfractionated 0.21 (*) 0.30 - 0.70 (IU/mL)   CBC     Status: Abnormal   Collection Time   10/20/11  2:20 AM      Component Value Range Comment   WBC 12.7 (*) 4.0 - 10.5 (K/uL)    RBC 4.84  4.22 - 5.81 (MIL/uL)    Hemoglobin 14.9  13.0 - 17.0 (g/dL)    HCT 16.1  09.6 - 04.5 (%)    MCV 90.3  78.0 - 100.0 (fL)    MCH 30.8  26.0 - 34.0 (pg)    MCHC 34.1  30.0 - 36.0 (g/dL)    RDW 40.9  81.1 - 91.4 (%)     Platelets 267  150 - 400 (K/uL)     Imaging: Dg Chest 2 View  10/18/2011  *RADIOLOGY REPORT*  Clinical Data: Chest pain and shortness of breath.  CHEST - 2 VIEW  Comparison: 11/20/2010  Findings: Two views of the chest were obtained.  The patient is post CABG procedure.  There is gas near the GE junction and cannot exclude a small hiatal hernia.  There is a stable linear density in the left mid lung that may represent scarring.  No evidence for focal airspace disease or edema.  Heart size is stable.  Trachea is midline.  Bony structures are intact.  IMPRESSION: No acute chest findings.  Original Report Authenticated By: Richarda Overlie, M.D.    Assessment:  1. Principal Problem: 2.  *Unstable angina 3. Active Problems: 4.  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  HTN (hypertension) 6.  Dyslipidemia 7.  COPD (chronic obstructive pulmonary disease) 8.  Cardiomyopathy, ischemic, EF 40-45% 2D 11/10 9.   Plan:  1. No angina or dyspnea at rest 2. Continue antiplatelet, anticoagulant and antianginal Rx 3. Cath +/- PCI in AM - risks and benefits reviewed  4. Smoking cessation discussed - did not meet much interest 5. Persistent elevation in WBC/neutrophils noted, not clearly explained. Also seen during previous admissions. No overt infection.  Time Spent Directly with Patient:  45 minutes  Length of Stay:  LOS: 2 days  Tauheed Mcfayden 10/20/2011, 8:14 AM

## 2011-10-20 NOTE — ED Provider Notes (Signed)
Medical screening examination/treatment/procedure(s) were conducted as a shared visit with non-physician practitioner(s) and myself.  I personally evaluated the patient during the encounter   Joya Gaskins, MD 10/20/11 [phone]

## 2011-10-20 NOTE — Progress Notes (Signed)
ANTICOAGULATION CONSULT NOTE - Follow Up Consult  Pharmacy Consult for Heparin Indication: chest pain/ACS  Assessment:  61 y.o. M on heparin/integrillin for ACS sx with a SUBtherapeutic HL this evening though trending up (HL 0.24, goal of 0.3-0.7)  No s/sx of bleeding noted per nurse report.  Goal of Therapy:  Heparin level 0.3-0.7 (increased as pt now off Integrillin)  Plan:  1) Increase heparin drip to 1850 units/hr (18.5 ml/hr)  2) Will continue to monitor for any signs/symptoms of bleeding and will follow up with heparin level in 6 hours   Georgina Pillion, PharmD, BCPS 10/20/2011 7:41 PM      Allergies  Allergen Reactions  . Codeine Nausea And Vomiting    Pt states when he takes codeine, he throws up blood  . Niacin And Related     "made his body feel hot & he had a hard time breathing"    Patient Measurements: Height: 5\' 8"  (172.7 cm) Weight: 197 lb 5 oz (89.5 kg) IBW/kg (Calculated) : 68.4  Heparin Dosing Weight: 86.4 kg  Vital Signs: Temp: 97.8 F (36.6 C) (01/06 1600) Temp src: Oral (01/06 1600) BP: 144/76 mmHg (01/06 1725) Pulse Rate: 83  (01/06 1725)  Labs:  Basename 10/20/11 1808 10/20/11 1036 10/20/11 0220 10/19/11 1433 10/19/11 0914 10/19/11 0200 10/19/11 0158 10/18/11 1910 10/18/11 1532  HGB -- -- 14.9 -- -- -- 14.4 -- --  HCT -- -- 43.7 -- -- -- 43.6 43.0 --  PLT -- -- 267 -- -- -- 270 309 --  APTT -- -- -- -- -- -- -- 29 --  LABPROT -- -- -- -- -- -- -- 12.6 --  INR -- -- -- -- -- -- -- 0.92 --  HEPARINUNFRC 0.24* 0.15* 0.21* -- -- -- -- -- --  CREATININE -- -- -- -- -- -- 1.08 1.01 1.09  CKTOTAL -- -- -- 74 79 91 -- -- --  CKMB -- -- -- 2.4 2.6 2.6 -- -- --  TROPONINI -- -- -- <0.30 <0.30 <0.30 -- -- --   Estimated Creatinine Clearance: 77 ml/min (by C-G formula based on Cr of 1.08).

## 2011-10-20 NOTE — Progress Notes (Signed)
ANTICOAGULATION CONSULT NOTE - Follow Up Consult  Pharmacy Consult for heparin/Integrilin Indication: chest pain/ACS  Allergies  Allergen Reactions  . Codeine Nausea And Vomiting    Pt states when he takes codeine, he throws up blood  . Niacin And Related     "made his body feel hot & he had a hard time breathing"    Patient Measurements: Height: 5\' 8"  (172.7 cm) Weight: 195 lb 1.7 oz (88.5 kg) IBW/kg (Calculated) : 68.4   Vital Signs: Temp: 97.8 F (36.6 C) (01/05 2000) Temp src: Oral (01/05 2000) BP: 128/76 mmHg (01/06 0000)  Labs:  Basename 10/20/11 0220 10/19/11 1826 10/19/11 1433 10/19/11 0914 10/19/11 0200 10/19/11 0158 10/18/11 1910 10/18/11 1532  HGB 14.9 -- -- -- -- 14.4 -- --  HCT 43.7 -- -- -- -- 43.6 43.0 --  PLT 267 -- -- -- -- 270 309 --  APTT -- -- -- -- -- -- 29 --  LABPROT -- -- -- -- -- -- 12.6 --  INR -- -- -- -- -- -- 0.92 --  HEPARINUNFRC 0.21* 0.21* -- 0.22* -- -- -- --  CREATININE -- -- -- -- -- 1.08 1.01 1.09  CKTOTAL -- -- 74 79 91 -- -- --  CKMB -- -- 2.4 2.6 2.6 -- -- --  TROPONINI -- -- <0.30 <0.30 <0.30 -- -- --   Estimated Creatinine Clearance: 76.6 ml/min (by C-G formula based on Cr of 1.08).   Medications:  Scheduled:     . active partnership for health of your heart book   Does not apply Once  . antiseptic oral rinse  15 mL Mouth Rinse BID  . aspirin EC  81 mg Oral Daily  . busPIRone  5 mg Oral BID  . clopidogrel  75 mg Oral Daily  . famotidine  20 mg Oral Daily  . Fluticasone-Salmeterol  1 puff Inhalation Q12H  . losartan  100 mg Oral Daily   And  . hydrochlorothiazide  12.5 mg Oral Daily  . influenza  inactive virus vaccine  0.5 mL Intramuscular Tomorrow-1000  . metoprolol  50 mg Oral BID  . nicotine  14 mg Transdermal Daily  . nicotine  14 mg Transdermal To Major  . pneumococcal 23 valent vaccine  0.5 mL Intramuscular Tomorrow-1000  . ranolazine  500 mg Oral BID  . sodium chloride  3 mL Intravenous Q12H  . tiotropium   18 mcg Inhalation Daily   Infusions:     . sodium chloride 10 mL/hr at 10/19/11 2100  . eptifibatide 2 mcg/kg/min (10/19/11 2320)  . heparin 1,200 Units/hr (10/19/11 2200)  . nitroGLYCERIN 5 mcg/min (10/19/11 2200)    Assessment: 60yo male remains subtherapeutic on heparin with no movement in level after rate increase; also on Integrilin.  CBC stable.  Goal of Therapy:  Heparin level 0.3-0.5   Plan:  Will increase gtt by ~2 units/kg/hr to 1400 units/hr and check level in 6hr.  Continue to follow CBC.  Colleen Can PharmD BCPS 10/20/2011,3:33 AM

## 2011-10-20 NOTE — Progress Notes (Signed)
ANTICOAGULATION CONSULT NOTE - Follow Up Consult  Pharmacy Consult for Heparin/integrilin  Indication: chest pain/ACS  Assessment:  61yo male remains subtherapeutic on heparin with decrease in heparin level after rate increase; also on Integrilin. CBC stable.   Goal of Therapy:  Heparin level 0.3-0.5   Plan:  Will increase gtt by ~3 units/kg/hr to 1650 units/hr and check level in 6hr. Continue to follow CBC.    Allergies  Allergen Reactions  . Codeine Nausea And Vomiting    Pt states when he takes codeine, he throws up blood  . Niacin And Related     "made his body feel hot & he had a hard time breathing"    Patient Measurements: Height: 5\' 8"  (172.7 cm) Weight: 197 lb 5 oz (89.5 kg) IBW/kg (Calculated) : 68.4   Vital Signs: Temp: 98.8 F (37.1 C) (01/06 0800) Temp src: Oral (01/06 0800) BP: 110/69 mmHg (01/06 0500)  Labs:  Basename 10/20/11 1036 10/20/11 0220 10/19/11 1826 10/19/11 1433 10/19/11 0914 10/19/11 0200 10/19/11 0158 10/18/11 1910 10/18/11 1532  HGB -- 14.9 -- -- -- -- 14.4 -- --  HCT -- 43.7 -- -- -- -- 43.6 43.0 --  PLT -- 267 -- -- -- -- 270 309 --  APTT -- -- -- -- -- -- -- 29 --  LABPROT -- -- -- -- -- -- -- 12.6 --  INR -- -- -- -- -- -- -- 0.92 --  HEPARINUNFRC 0.15* 0.21* 0.21* -- -- -- -- -- --  CREATININE -- -- -- -- -- -- 1.08 1.01 1.09  CKTOTAL -- -- -- 74 79 91 -- -- --  CKMB -- -- -- 2.4 2.6 2.6 -- -- --  TROPONINI -- -- -- <0.30 <0.30 <0.30 -- -- --   Estimated Creatinine Clearance: 77 ml/min (by C-G formula based on Cr of 1.08).  Medications:  Scheduled:    . antiseptic oral rinse  15 mL Mouth Rinse BID  . aspirin EC  81 mg Oral Daily  . busPIRone  5 mg Oral BID  . clopidogrel  75 mg Oral Daily  . famotidine  20 mg Oral Daily  . Fluticasone-Salmeterol  1 puff Inhalation Q12H  . losartan  100 mg Oral Daily  . metoprolol  50 mg Oral BID  . nicotine  14 mg Transdermal Daily  . nicotine  14 mg Transdermal To Major  . ranolazine   500 mg Oral BID  . sodium chloride  3 mL Intravenous Q12H  . sodium chloride  3 mL Intravenous Q12H  . tiotropium  18 mcg Inhalation Daily  . DISCONTD: hydrochlorothiazide  12.5 mg Oral Daily  . DISCONTD: lisinopril  20 mg Oral Daily   Infusions:    . sodium chloride 10 mL/hr at 10/20/11 0600  . heparin 1,400 Units/hr (10/20/11 0600)  . nitroGLYCERIN 5 mcg/min (10/20/11 0600)  . DISCONTD: eptifibatide 2.001 mcg/kg/min (10/20/11 0600)   PRN: sodium chloride, acetaminophen, ALPRAZolam, ALPRAZolam, levalbuterol, nitroGLYCERIN, ondansetron (ZOFRAN) IV, sodium chloride, zolpidem  Juliann Pulse 10/20/2011,12:03 PM

## 2011-10-20 NOTE — Progress Notes (Signed)
Pt was not on Lisinopril prior to admission per Dr Landry Dyke notes. Will stop ACE and continue ARB especially in light of significant COPD. Will stop HCTZ for cath in am.  Corine Shelter PA-C 10/20/2011 9:47 AM

## 2011-10-21 ENCOUNTER — Encounter (HOSPITAL_COMMUNITY)
Admission: EM | Disposition: A | Discharge status: Home / Self Care | Payer: Self-pay | Source: Home / Self Care | Attending: Cardiovascular Disease

## 2011-10-21 DIAGNOSIS — F17200 Nicotine dependence, unspecified, uncomplicated: Secondary | ICD-10-CM | POA: Diagnosis present

## 2011-10-21 HISTORY — PX: LEFT HEART CATHETERIZATION WITH CORONARY ANGIOGRAM: SHX5451

## 2011-10-21 HISTORY — PX: GRAFT(S) ANGIOGRAM: SHX5479

## 2011-10-21 LAB — CBC
HCT: 42.3 % (ref 39.0–52.0)
Hemoglobin: 14.3 g/dL (ref 13.0–17.0)
MCH: 30.8 pg (ref 26.0–34.0)
MCHC: 33.8 g/dL (ref 30.0–36.0)
MCV: 91 fL (ref 78.0–100.0)
RDW: 13.5 % (ref 11.5–15.5)

## 2011-10-21 LAB — BASIC METABOLIC PANEL
BUN: 11 mg/dL (ref 6–23)
CO2: 29 mEq/L (ref 19–32)
Calcium: 8.9 mg/dL (ref 8.4–10.5)
Chloride: 101 mEq/L (ref 96–112)
Creatinine, Ser: 0.97 mg/dL (ref 0.50–1.35)
GFR calc Af Amer: >90 mL/min (ref >90)
GFR calc non Af Amer: 87 mL/min — ABNORMAL LOW (ref >90)
Glucose, Bld: 125 mg/dL — ABNORMAL HIGH (ref 70–99)
Potassium: 4 mEq/L (ref 3.5–5.1)
Sodium: 138 mEq/L (ref 135–145)

## 2011-10-21 SURGERY — LEFT HEART CATHETERIZATION WITH CORONARY/GRAFT ANGIOGRAM
Anesthesia: LOCAL

## 2011-10-21 SURGERY — LEFT HEART CATHETERIZATION WITH CORONARY ANGIOGRAM
Anesthesia: LOCAL

## 2011-10-21 MED ORDER — SODIUM CHLORIDE 0.9 % IV SOLN
1.0000 mL/kg/h | INTRAVENOUS | Status: AC
  Administered 2011-10-21: 1 mL/kg/h via INTRAVENOUS

## 2011-10-21 MED ORDER — NITROGLYCERIN IN D5W 200-5 MCG/ML-% IV SOLN
20.0000 ug/min | INTRAVENOUS | Status: DC
  Administered 2011-10-21: 10 ug/min via INTRAVENOUS
  Filled 2011-10-21: qty 250

## 2011-10-21 MED ORDER — NITROGLYCERIN 0.2 MG/ML ON CALL CATH LAB
INTRAVENOUS | Status: AC
  Filled 2011-10-21: qty 1

## 2011-10-21 MED ORDER — ACETAMINOPHEN 325 MG PO TABS: 650.0000 mg | ORAL_TABLET | ORAL | Status: DC | PRN

## 2011-10-21 MED ORDER — LIDOCAINE HCL (PF) 1 % IJ SOLN
INTRAMUSCULAR | Status: AC
  Filled 2011-10-21: qty 30

## 2011-10-21 MED ORDER — FENTANYL CITRATE 0.05 MG/ML IJ SOLN
INTRAMUSCULAR | Status: AC
  Filled 2011-10-21: qty 2

## 2011-10-21 MED ORDER — MIDAZOLAM HCL 2 MG/2ML IJ SOLN
INTRAMUSCULAR | Status: AC
  Filled 2011-10-21: qty 2

## 2011-10-21 MED ORDER — HEPARIN (PORCINE) IN NACL 2-0.9 UNIT/ML-% IJ SOLN
INTRAMUSCULAR | Status: AC
  Filled 2011-10-21: qty 2000

## 2011-10-21 NOTE — Progress Notes (Signed)
Subjective:  No chest  Objective:  Vital Signs in the last 24 hours: Temp:  [97.5 F (36.4 C)-98.6 F (37 C)] 98.4 F (36.9 C) (01/07 0827) Pulse Rate:  [75-87] 77  (01/07 0827) Resp:  [16-20] 16  (01/07 0000) BP: (125-144)/(68-106) 141/68 mmHg (01/07 0827) SpO2:  [93 %-98 %] 93 % (01/07 0827) Weight:  [89 kg (196 lb 3.4 oz)] 196 lb 3.4 oz (89 kg) (01/07 0500)  Intake/Output from previous day:  Intake/Output Summary (Last 24 hours) at 10/21/11 0900 Last data filed at 10/21/11 0800  Gross per 24 hour  Intake 1854.38 ml  Output   1115 ml  Net 739.38 ml    Physical Exam: General appearance: alert, cooperative and no distress Lungs: clear to auscultation bilaterally Heart: regular rate and rhythm   Rate: 82  Rhythm: normal sinus rhythm  Lab Results:  Basename 10/21/11 0532 10/20/11 0220  WBC 10.6* 12.7*  HGB 14.3 14.9  PLT 249 267    Basename 10/21/11 0532 10/19/11 0158  NA 138 136  K 4.0 3.8  CL 101 99  CO2 29 29  GLUCOSE 125* 119*  BUN 11 12  CREATININE 0.97 1.08    Basename 10/19/11 1433 10/19/11 0914  TROPONINI <0.30 <0.30   Hepatic Function Panel  Basename 10/18/11 1910  PROT 7.1  ALBUMIN 3.6  AST 20  ALT 19  ALKPHOS 67  BILITOT 0.4  BILIDIR --  IBILI --   No results found for this basename: CHOL in the last 72 hours  Basename 10/18/11 1910  INR 0.92    Imaging: No results found.  Cardiac Studies:  Assessment/Plan:   Principal Problem:  *Unstable angina Active Problems:  S/P CABG x 4, 2006. PCI SVG-PDA and POBA to DX 11/10  HTN (hypertension)  Dyslipidemia  COPD (chronic obstructive pulmonary disease)  Cardiomyopathy, ischemic, EF 40-45% 2D 11/10   Plan-cath with likely PCI  [facility] PA-C 10/21/2011, 9:00 AM  Continue ASA/Plavix, BB & ARB with Statin   CARDIAC CATHETERIZATION CONSENT  Performing MD:  Marykay Lex, M.D., M.S.  Procedure:  LHC with coronary & graft anatomy today +/- PCI  The procedure with  Risks/Benefits/Alternatives and Indications was reviewed with the patient and sister.  All questions were answered.    Risks / Complications include, but not limited to: Death, MI, CVA/TIA, VF/VT (with defibrillation), Bradycardia (need for temporary pacer placement), contrast induced nephropathy, bleeding / bruising / hematoma / pseudoaneurysm, vascular or coronary injury (with possible emergent CT or Vascular Surgery), adverse medication reactions, infection.  With severe aortic disease - increased risk of CVA also discussed.  The patient (and family) voice understanding and agree to proceed.   I have signed the consent form and placed it on the chart for patient signature and RN witness.     Marykay Lex, M.D., M.S. [facility] [address] [place], [place]  [address]  [phone]  10/21/2011 9:14 AM

## 2011-10-21 NOTE — Progress Notes (Signed)
Pt is a heavy 2 ppd smoker. He is in precontemplation stage even though he voices the risk factors associated with his smoking and its effect on his health. Advised pt to quit. He says he's tried everything and it hasn't worked for him. I believe that at this time he is not interested and he has not tried everything per his answers to my questions. Pt refuses education information.

## 2011-10-21 NOTE — Plan of Care (Signed)
Problem: Consults Goal: Cardiac Cath Patient Education (See Patient Education module for education specifics.) Outcome: Completed/Met Date Met:  10/21/11 Pt states that he has had a cardiac cath before and doesn't need to watch the video; state he has no questions; explained that we will be prepping his right groin approx around 4am Goal: Tobacco Cessation referral if indicated Outcome: Not Applicable Date Met:  10/21/11 Has been addressed in previous care plan

## 2011-10-21 NOTE — Brief Op Note (Signed)
10/18/2011 - 10/21/2011  12:51 PM  PATIENT:  Troy [name]  61 y.o. male followed by Dr. Tresa Endo with a history of coronary disease. He had bypass grafting x4 in March of 2006. He had an LIMA to the LAD, SVG to the diagonal, SVG to the OM, and SVG to the PDA. In November 2010 he presented with unstable angina. He underwent SVG to PDA stenting and 2 days later a staged angioplasty to the diagonal. He has seen Dr. Tresa Endo in followup. The patient says he had a Myoview in the office earlier this year and Dr. Tresa Endo thought it was suspicious for progression of coronary disease. Symptomatically the patient seemed be doing okay at that time. He now presents to the emergency room after having an episode of substernal chest pain that radiated to his neck and then into his jaw. This was associated with diaphoresis weakness and shortness of breath. He did get some relief with nitroglycerin x2. The patient's wife indicates he's actually been having chest pain off and on for the last 2 weeks and using nitroglycerin. He is currently pain-free. His EKG does show some new T-wave inversion in lead 1 aVL and V4 and 5. He has ruled out for MI, but is referred for cardiac catheterization.  PRE-OPERATIVE DIAGNOSIS:  [place]  POST-OPERATIVE DIAGNOSIS:   No clear culprit lesion  Patent but small caliber LIMA-small caliber distal LAD  Patent ostial stent to RPDA with irregular borders and turbulent flow, likely related to post stent ectatic region.  TIMI 3 flow distally.  Cannot rule out mild thrombus, but does not appear to be.    Widely patent SVG to OM1 filling retrograde up the native Cx.  Known Flush occlusion of SVG-Diag  Native LAD - small vessel with diffuse luminal irregularities  Large branching diagonal with ~20-30% proximal lesion, but minimal restenosis of previous PTCA site  Native LCx - early mid long tubular 80% followed by 100% occlusion in OMB (fills via SVG)  Native RCA - prox 100% with trace distal flow due  to bridging collaterals, also L-R collaterals from LAD septals  LV Grm - EF ~45% with distal Anterior hypokinesis.  Hemodynamics:   AoP: 12680 mmHg; mean 100 mmHg  LV:  1276 mmHg; LVEDP 11 mHg  PROCEDURE:  Procedure(s): LEFT HEART CATHETERIZATION WITH CORONARY ANGIOGRAM GRAFT(S) ANGIOGRAM -- 5 Fr Right Femoral Artery   Surgeon(s): Marykay Lex  ANESTHESIA:   local and IV sedation - 1mg  Versed, 75 mcg Fentanyl  CONTRAST: 205 ml Omnipaque  EBL:  Total I/O In: 109.5 [I.V.:109.5] Out: -   CATHETERS: JL4, JR4, IMA, Angled Pigtail, RCB (for RCA graft)  LOCAL MEDICATIONS USED:  LIDOCAINE 20 ml   DICTATION: .Note written in paper chart and Note written in EPIC  PLAN OF CARE: Admit to inpatient  - Medication adjustment -- increase nitrate, continue Ranexa, smoking cessation counseling  PATIENT DISPOSITION:  PACU - hemodynamically stable.  Stay in Stepdown in case of further episodes of chest pain.   Delay start of Pharmacological VTE agent (>24hrs) due to surgical blood loss or risk of bleeding: N/A  Marykay Lex, M.D., M.S. [facility] [address] [place], [place]  [address]  [phone]  10/21/2011 1:03 PM

## 2011-10-22 LAB — CBC
HCT: 41.8 % (ref 39.0–52.0)
MCH: 30.5 pg (ref 26.0–34.0)
MCHC: 33.3 g/dL (ref 30.0–36.0)
MCV: 91.7 fL (ref 78.0–100.0)
Platelets: 252 10*3/uL (ref 150–400)
RDW: 13.5 % (ref 11.5–15.5)

## 2011-10-22 MED ORDER — ALPRAZOLAM 0.5 MG PO TABS: 0.2500 mg | ORAL_TABLET | Freq: Three times a day (TID) | ORAL | Status: AC | PRN

## 2011-10-22 MED ORDER — ALPRAZOLAM 0.5 MG PO TABS: 0.2500 mg | ORAL_TABLET | Freq: Three times a day (TID) | ORAL | Status: DC | PRN

## 2011-10-22 MED ORDER — RANOLAZINE ER 500 MG PO TB12: 1000.0000 mg | ORAL_TABLET | Freq: Two times a day (BID) | ORAL | Status: DC

## 2011-10-22 MED ORDER — ISOSORBIDE MONONITRATE ER 30 MG PO TB24
30.0000 mg | ORAL_TABLET | Freq: Every day | ORAL | Status: DC
  Administered 2011-10-22: 30 mg via ORAL
  Filled 2011-10-22: qty 1

## 2011-10-22 MED ORDER — ASPIRIN 81 MG PO TBEC: 81.0000 mg | DELAYED_RELEASE_TABLET | Freq: Every day | ORAL | Status: AC

## 2011-10-22 MED ORDER — ACETAMINOPHEN 325 MG PO TABS: 650.0000 mg | ORAL_TABLET | ORAL | Status: AC | PRN

## 2011-10-22 MED ORDER — ISOSORBIDE MONONITRATE ER 30 MG PO TB24: 30.0000 mg | ORAL_TABLET | Freq: Every day | ORAL | Status: DC

## 2011-10-22 MED ORDER — NITROGLYCERIN 0.4 MG SL SUBL: 0.4000 mg | SUBLINGUAL_TABLET | SUBLINGUAL | Status: DC | PRN

## 2011-10-22 MED ORDER — NICOTINE 14 MG/24HR TD PT24: 1.0000 | MEDICATED_PATCH | Freq: Every day | TRANSDERMAL | Status: AC

## 2011-10-22 NOTE — Discharge Summary (Signed)
Patient ID: Troy [name],  MRN: [id number], DOB/AGE: [birth year] 61 y.o.  Admit date: 10/18/2011 Discharge date: 10/23/2011  Primary Care Provider: Dr Brendia Sacks Primary Cardiologist: Dr Tresa Endo  Discharge Diagnoses Principal Problem:  *Unstable angina Active Problems:  S/P CABG x 4, 2006.PCI 11/10, cath this adm- Med Rx  HTN (hypertension)  Dyslipidemia  COPD (chronic obstructive pulmonary disease)  Cardiomyopathy, ischemic, EF 40-45% 2D 11/10  Smoking    Procedures: Cardiac cath 10/21/11   Hospital Course: Troy [name] is a 61 year old male followed Dr. Tresa Endo with a history of coronary disease with bypass grafting x4 in March 2006 with LIMA to the LAD, SVG to the diagonal, SVG to the OM and SVG to PDA. In November 2010 he presented with unstable angina underwent intervention to the SVG to PDA using a stent. 2 days later he had a staged angioplasty to diagonal. He presented to the emergency room with unstable angina January 4 213. His symptoms were consistent with unstable angina, patient complained of chest pressure with pain in his arm or back. Patient's wife indicated the patient actually chest pain off and on for 2 weeks and was using NTG. His EKG in the emergency room showed new T wave inversion in lead 1 aVL and V4 and V5. He was started on Integrilin and heparin and nitroglycerin and admitted. His enzymes were negative. His Integrilin was discontinued after 24 hours. He was set up for diagnostic catheterization 10/22/2011. Please see Dr. Elissa Hefty note for complete details. There is no significant change from his previous angiogram. The stented vein graft to the PDA was patent although there was some luminal irregularities with some distal ectasia. Some injections suggested thrombotic material. There was TIMI 3 flow distally. The SVG to the OM was widely patent. The SVG to the diagonal occluded and this is old. LIMA to the LAD was a small, nearly atretic vessel. There was some competitive  flow in the native vessel. Previously angioplastied first diagonal site was patent. The patient's ejection fraction was 45%. Plan is for continued aggressive medical therapy. We feel he can be discharged late on the eighth. He'll follow up with Dr. Tresa Endo as an outpatient. The patient has a long history of heavy smoking and he was seen by smoking cessation. He is discharged in stable condition.  Discharge Vitals:  Blood pressure 151/87, pulse 76, temperature 97.8 F (36.6 C), temperature source Oral, resp. rate 15, height 5\' 8"  (1.727 m), weight 89 kg (196 lb 3.4 oz), SpO2 91.00%.    Labs: Results for orders placed during the hospital encounter of 10/18/11 (from the past 48 hour(s))  POCT ACTIVATED CLOTTING TIME     Status: Normal   Collection Time   10/21/11 12:05 PM      Component Value Range Comment   Activated Clotting Time 122     CBC     Status: Abnormal   Collection Time   10/22/11  5:50 AM      Component Value Range Comment   WBC 11.7 (*) 4.0 - 10.5 (K/uL)    RBC 4.56  4.22 - 5.81 (MIL/uL)    Hemoglobin 13.9  13.0 - 17.0 (g/dL)    HCT 36.6  44.0 - 34.7 (%)    MCV 91.7  78.0 - 100.0 (fL)    MCH 30.5  26.0 - 34.0 (pg)    MCHC 33.3  30.0 - 36.0 (g/dL)    RDW 42.5  95.6 - 38.7 (%)    Platelets 252  150 - 400 (K/uL)  Disposition:  Follow-up Information    Follow up with TEJAN-SIE,SHEIKH, MD. Make an appointment in 2 weeks.      Follow up with Lennette Bihari, MD. (office will call)    Contact information:   [address] [place] [address] [phone]          Discharge Medications:  Discharge Medication List as of 10/22/2011 11:19 AM    START taking these medications   Details  acetaminophen (TYLENOL) 325 MG tablet Take 2 tablets (650 mg total) by mouth every 4 (four) hours as needed., Starting 10/22/2011, Until Fri 11/01/11, No Print    aspirin EC 81 MG EC tablet Take 1 tablet (81 mg total) by mouth daily., Starting 10/22/2011, Until Wed  10/21/12, No Print    isosorbide mononitrate (IMDUR) 30 MG 24 hr tablet Take 1 tablet (30 mg total) by mouth daily., Starting 10/22/2011, Until Wed 10/21/12, Normal    nicotine (NICODERM CQ - DOSED IN MG/24 HOURS) 14 mg/24hr patch Place 1 patch onto the skin daily. Only use this if you are not smoking, Starting 10/22/2011, Until Thu 11/21/11, No Print    nitroGLYCERIN (NITROSTAT) 0.4 MG SL tablet Place 1 tablet (0.4 mg total) under the tongue every 5 (five) minutes as needed for chest pain., Starting 10/22/2011, Until Wed 10/21/12, Normal      CONTINUE these medications which have CHANGED   Details  ALPRAZolam (XANAX) 0.5 MG tablet Take 0.5 tablets (0.25 mg total) by mouth 3 (three) times daily as needed for anxiety., Starting 10/22/2011, Until Thu 11/21/11, Normal    ranolazine (RANEXA) 500 MG 12 hr tablet Take 2 tablets (1,000 mg total) by mouth 2 (two) times daily., Starting 10/22/2011, Until Wed 10/21/12, Normal      CONTINUE these medications which have NOT CHANGED   Details  busPIRone (BUSPAR) 5 MG tablet Take 5 mg by mouth 2 (two) times daily. , Until Discontinued, Historical Med    clopidogrel (PLAVIX) 75 MG tablet Take 75 mg by mouth daily.  , Until Discontinued, Historical Med    famotidine (PEPCID) 20 MG tablet Take 20 mg by mouth daily. , Until Discontinued, Historical Med    Fluticasone-Salmeterol (ADVAIR) 100-50 MCG/DOSE AEPB Inhale 1 puff into the lungs every 12 (twelve) hours.  , Until Discontinued, Historical Med    losartan-hydrochlorothiazide (HYZAAR) 100-12.5 MG per tablet Take 1 tablet by mouth daily.  , Until Discontinued, Historical Med    metoprolol (TOPROL-XL) 100 MG 24 hr tablet Take 50 mg by mouth 2 (two) times daily.  , Until Discontinued, Historical Med    tiotropium (SPIRIVA) 18 MCG inhalation capsule Place 18 mcg into inhaler and inhale daily.  , Until Discontinued, Historical Med      STOP taking these medications     lisinopril (PRINIVIL,ZESTRIL) 20 MG tablet          Outstanding Labs/Studies  Duration of Discharge Encounter: Greater than 30 minutes including physician time.  Jolene Provost PA-C 10/23/2011 11:35 AM

## 2011-10-22 NOTE — Op Note (Signed)
[facility]     CARDIAC CATHETERIZATION REPORT  Troy [name]   MRN: [id number] [birth year]   ADMIT DATE:  10/18/2011  Performing Cardiologist: Marykay Lex, M.D. MS Primary Physician: Bluford Main, MD, MD Primary Cardiologist:  Nile Dear, M.D.  Procedures Performed:  Left Heart Catheterization via 5 French Right Common Femoral Artery access  Left Ventriculography, (RAO) 11 ml/sec for 33 ml total contrast  Native Coronary Angiography  Internal Mammary Graft Angiography  Saphenous Vein Graft Graft Angiography x 3  Indication(s): Chest pain concern for unstable angina  History of CAD status post CABG x4; PCI to SVG-RPDA, PTCA to Diagonal (known occlusion of SVG to Diagonal)  Continued smoking  History: 61 y.o. male followed by Dr. Tresa Endo with a history of coronary disease. He had bypass grafting x4 in March of 2006. He had an LIMA to the LAD, SVG to the diagonal, SVG to the OM, and SVG to the PDA. In November 2010 he presented with unstable angina. He underwent SVG to PDA stenting and 2 days later a staged angioplasty to the diagonal. He has seen Dr. Tresa Endo in followup. The patient says he had a Myoview in the office earlier this year and Dr. Tresa Endo thought it was suspicious for progression of coronary disease. Symptomatically the patient seemed be doing okay at that time. He now presents to the emergency room after having an episode of substernal chest pain that radiated to his neck and then into his jaw. This was associated with diaphoresis weakness and shortness of breath. He did get some relief with nitroglycerin x2. The patient's wife indicates he's actually been having chest pain off and on for the last 2 weeks and using nitroglycerin. He is currently pain-free. His EKG does show some new T-wave inversion in lead 1 aVL and V4 and 5. He has ruled out for MI, but is referred for cardiac catheterization.  Consent: The procedure with  Risks/Benefits/Alternatives and Indications was reviewed with the patient (and sister).  All questions were answered.    Risks / Complications include, but not limited to: Death, MI, CVA/TIA, VF/VT (with defibrillation), Bradycardia (need for temporary pacer placement), contrast induced nephropathy, bleeding / bruising / hematoma / pseudoaneurysm, vascular or coronary injury (with possible emergent CT or Vascular Surgery), adverse medication reactions, infection.    The patient (and family) voice understanding and agree to proceed.    Consent for signed by MD and patient with RN witness -- placed on chart.  Procedure: The patient was brought to the 2nd Floor [facility] in the fasting state and prepped and draped in the usual sterile fashion for Right groin access. Sterile technique was used including antiseptics, cap, gloves, gown, hand hygiene, mask and sheet.  Skin prep: Chlorhexidine.  Time Out: Verified patient identification, verified procedure, site/side was marked, verified correct patient position, special equipment/implants available, medications/allergies/relevent history reviewed, required imaging and test results available.  Performed  The right femoral head was identified using tactile and fluoroscopic technique.  The right groin was anesthetized with 1% subcutaneous Lidocaine.  The right Common Femoral Artery was accessed using the Modified Seldinger Technique with placement of a antimicrobial bonded/coated single lumen (5 Fr) sheath.  The sheath was aspirated and flushed.    A  5 Fr  JL4 Catheter was advanced of over a Standard J wire into the ascending Aorta.  The catheter was used to engage the  Left Coronary Artery.  Multiple cineangiographic views of the  Left  Coronary Artery system were performed.   This catheter was exchanged over standard J-wire for a  5 Fr  JR 4 Catheter  that was used to engage the Right Coronary Artery.  Multiple cineangiographic  views of the Right Coronary Artery system were performed. this catheter was then used to engage the vein grafts to the diagonal which is known to be occluded confirmed angiographically as well as vein graft to the obtuse marginal and multiple cineangiographic views were taken of the vein graft to the OM. The catheter was used but not adequately selecting the SVG to RPDA although several views were obtained. This catheter was then redirected into the Left Subclavian Artery, which was done recently tortuous. The catheter was not successfully used to engage the LIMA to LAD graft, therefore was exchanged for a IMA catheter over a long exchange wire. The IMA catheter was  subselectively engage the to LIMA graft and multiple cineangiographic views were obtained. This catheter is on pullback of the aortic arch exchanged for angled Pigtail catheter that was advanced across the Aortic Valve.  LV hemodynamics were measured and Left Ventriculography was performed.  LV hemodynamics were then re-sampled, and the catheter was pulled back across the Aortic Valve for measurement of "pull-back" gradient.  The catheter exchanged for first a 5 [place] multipurpose catheter, followed by a 5 [place] RCB catheter which was used to selectively engage the the SVG to RPDA them multiple cineangiographic views of this vein graft were obtained. The catheters and rhythm without body over wire a complications..  The patient was transferred to the holding area where the sheath was removed (after ACT checked) with manual pressure held for hemostasis.    The patient was transported to the PACU followed by TCU in hemodynamically and pain-free condition.   The patient  was stable before, during and following the procedure.   Patient did tolerate procedure well. There were not complications.  EBL: Less than 10 mL  Medications:  Premedication: 5 mg  Valium  Sedation:  1 mg IV Versed, 75 IV mcg Fentanyl  Contrast:  205  Omnipaque   Hemodynamics:  Central Aortic Pressure / Mean Aortic Pressure: 126/80 mmHg; mean 100 mmHg  LV Pressure / LV End diastolic Pressure:  127/6 mmHg; LVEDP 11 mHg  Left Ventriculography:  EF:  Roughly 45%  Wall Motion: Distal anterior/anteroapical hypokinesis, severe  Coronary Angiographic Data:  Left Main:   Large caliber patent, bifurcates no severe disease  Left Anterior Descending (LAD):  Moderate large caliber tapers to a very small caliber vessel distally with to-and-fro flow from the LIMA. The distal LAD filling the LIMA is a very small-caliber. There is no evidence of significant coronary disease noted in the proximal midportion of the vessel  1st diagonal (D1):  This is a moderate caliber, large branching, vessel with a previous PTCA site is free of any significant stenosis roughly 20-30%.  Circumflex (LCx):  Moderate caliber nondominant vessel that has an early mid tubular 80% (long) lesion followed 100% occlusion in the OMB  1st obtuse marginal:  Martin occluded and fills via SVG with retrograde filling to the 80% tubular lesion in the Circumflex.  Right Coronary Artery: Partially occluded with bridging collaterals and left-to-right collaterals from LAD septals  posterior descending artery: Fills via vein graft with minimal luminal irregularities is a moderate to small-caliber vessel  posterior lateral branch:  Fills via retrograde flow from the vein graft to the PDA 2 branches with no significant disease  Grafts  LIMA -  LAD: Small nearly atretic vessel it does reach the LAD and provide flow to the distal LAD that is competitive with negative flow. There is no subclavian stenosis proximal to this although the left subclavian has a extremely tortuous takeoff from the aortic arch.  SVG - DIAG: Known to be occluded.  SVG - OM: Widely patent fills branching vessel and retrograde up into the native circumflex  SVG - RCA (RPDA/RPL): Ostial stent making the graft  difficulty  Approach and coaxially engage. The stent does appear to be patent however the graft around the stent appeared to be extremely irregular. Distal to the stent there is a mild ectatic portion. Some injections of the of the graft had the appearance of grummus/ or thrombotic material in the proximal portion of the stent; however further imaging would suggest this is simply turbulent flow due to the ectatic portion just distal to the stent with late filling after injection. There is TIMI 3 flow distally with with no evidence of a distal occlusion.  Impression: 1.  No significant change from previous postprocedural angiographic imaging. The stent in the vein graft to the RPDA is a potential culprit however, with followup imaging this be to becomes less likely to be the case. There is possible the stent was undersized for the vessel a long for negative remodeling. We'll need to confirm with the results from his noninvasive studies to determine if this is potentially culprit versus the slow nearly atretic flow to distal LAD via the LIMA. 2.  Moderately decreased EF with distal anterior/anteroapical hypokinesis. 3.  Normal EDP  Plan: 1.  Continue to monitor overnight in the TCU with medication adjustment including likely increase and it is nitrate. We'll continue acceptance increasing it to full dose. 2.  Aggressive smoking cessation counseling. 3.  Compare angiographic images with nuclear imaging to determine which culprit is more likely. 4.  Anticipate discharge likely tomorrow if symptoms are improved. He will followup with Dr. Tresa Endo  The case and results was discussed with the patient (and family). The case and results was not discussed with the patient's PCP. The case and results was discussed with the patient's Cardiologist.  Time Spend Directly with Patient:  45 minutes  HARDING,DAVID W, M.D., M.S. [facility] [address] [place], [place]   [address]  [phone]  10/22/2011 8:51 AM

## 2011-10-22 NOTE — Progress Notes (Signed)
Subjective:  No chest pain  Objective:  Vital Signs in the last 24 hours: Temp:  [97.8 F (36.6 C)-98.7 F (37.1 C)] 97.8 F (36.6 C) (01/08 0735) Pulse Rate:  [63-90] 76  (01/08 0735) Resp:  [13-22] 15  (01/08 0735) BP: (122-158)/(70-90) 151/87 mmHg (01/08 0735) SpO2:  [90 %-99 %] 95 % (01/08 0735)  Intake/Output from previous day:  Intake/Output Summary (Last 24 hours) at 10/22/11 0836 Last data filed at 10/22/11 0400  Gross per 24 hour  Intake 843.35 ml  Output   1250 ml  Net -406.65 ml    Physical Exam: General appearance: alert, cooperative and no distress Lungs: clear to auscultation bilaterally and decreased breath sounds Heart: regular rate and rhythm Rt groin without hematoma   Rate: 78  Rhythm: normal sinus rhythm  Lab Results:  Basename 10/22/11 0550 10/21/11 0532  WBC 11.7* 10.6*  HGB 13.9 14.3  PLT 252 249    Basename 10/21/11 0532  NA 138  K 4.0  CL 101  CO2 29  GLUCOSE 125*  BUN 11  CREATININE 0.97    Basename 10/19/11 1433 10/19/11 0914  TROPONINI <0.30 <0.30   Hepatic Function Panel No results found for this basename: PROT,ALBUMIN,AST,ALT,ALKPHOS,BILITOT,BILIDIR,IBILI in the last 72 hours No results found for this basename: CHOL in the last 72 hours No results found for this basename: INR in the last 72 hours  Imaging: No results found.  Cardiac Studies:  Assessment/Plan:   Principal Problem:  *Unstable angina, MI r/o  Active Problems:  S/P CABG x 4, 2006.PCI 11/10, cath this adm- Med Rx  HTN (hypertension)  Dyslipidemia  COPD (chronic obstructive pulmonary disease)  Cardiomyopathy, ischemic, EF 40-45% 2D 11/10  Smoking   Plan-Add Nitrate, tx 2000, home in am, or possibly this pm. F/U Dr Tresa Endo.   Corine Shelter PA-C 10/22/2011, 8:36 AM   I have seen and examined the patient along with Corine Shelter PA-C.  I have reviewed the chart, notes and new data.  I agree with PA's note.  Key new complaints: angina free Key  examination changes: no access site complications Key new findings / data: cath results reviewed  PLAN: Agree with dc home. Smoking cessation extremely important but he shows Hislop interest. Agree with adding nitrates. Would also increase Ranexa to 1000 mg BID.  Thurmon Fair, MD, [facility] [phone] 10/22/2011, 9:01 AM

## 2011-10-24 NOTE — Discharge Summary (Signed)
Thurmon Fair, MD, [facility] [phone] 10/24/2011 12:33 PM

## 2013-02-24 ENCOUNTER — Other Ambulatory Visit: Payer: Self-pay | Admitting: *Deleted

## 2013-02-24 MED ORDER — LOSARTAN POTASSIUM-HCTZ 100-12.5 MG PO TABS: 1.0000 | ORAL_TABLET | Freq: Every day | ORAL | Status: DC

## 2013-04-21 ENCOUNTER — Other Ambulatory Visit: Payer: Self-pay | Admitting: Cardiovascular Disease

## 2013-04-21 NOTE — Telephone Encounter (Signed)
Rx was sent to pharmacy electronically. 

## 2013-05-06 ENCOUNTER — Other Ambulatory Visit: Payer: Self-pay | Admitting: *Deleted

## 2013-05-06 MED ORDER — ISOSORBIDE MONONITRATE ER 30 MG PO TB24: 30.0000 mg | ORAL_TABLET | Freq: Every day | ORAL | Status: DC

## 2013-05-12 ENCOUNTER — Other Ambulatory Visit: Payer: Self-pay | Admitting: Cardiovascular Disease

## 2013-05-12 NOTE — Telephone Encounter (Signed)
Rx was sent to pharmacy electronically. 

## 2013-05-25 ENCOUNTER — Ambulatory Visit (INDEPENDENT_AMBULATORY_CARE_PROVIDER_SITE_OTHER): Payer: Medicare Other | Admitting: Cardiovascular Disease

## 2013-05-25 ENCOUNTER — Encounter: Payer: Self-pay | Admitting: Cardiovascular Disease

## 2013-05-25 VITALS — BP 108/64 | HR 75 | Ht 68.0 in | Wt 213.4 lb

## 2013-05-25 DIAGNOSIS — R5381 Other malaise: Secondary | ICD-10-CM

## 2013-05-25 DIAGNOSIS — E785 Hyperlipidemia, unspecified: Secondary | ICD-10-CM

## 2013-05-25 DIAGNOSIS — F17200 Nicotine dependence, unspecified, uncomplicated: Secondary | ICD-10-CM

## 2013-05-25 DIAGNOSIS — Z951 Presence of aortocoronary bypass graft: Secondary | ICD-10-CM

## 2013-05-25 DIAGNOSIS — I2589 Other forms of chronic ischemic heart disease: Secondary | ICD-10-CM

## 2013-05-25 DIAGNOSIS — J4489 Other specified chronic obstructive pulmonary disease: Secondary | ICD-10-CM

## 2013-05-25 DIAGNOSIS — J449 Chronic obstructive pulmonary disease, unspecified: Secondary | ICD-10-CM

## 2013-05-25 DIAGNOSIS — R5383 Other fatigue: Secondary | ICD-10-CM

## 2013-05-25 DIAGNOSIS — I2510 Atherosclerotic heart disease of native coronary artery without angina pectoris: Secondary | ICD-10-CM

## 2013-05-25 DIAGNOSIS — I200 Unstable angina: Secondary | ICD-10-CM

## 2013-05-25 DIAGNOSIS — I10 Essential (primary) hypertension: Secondary | ICD-10-CM

## 2013-05-25 DIAGNOSIS — I255 Ischemic cardiomyopathy: Secondary | ICD-10-CM

## 2013-05-25 MED ORDER — ISOSORBIDE MONONITRATE ER 60 MG PO TB24: 60.0000 mg | ORAL_TABLET | Freq: Every day | ORAL | Status: DC

## 2013-05-25 NOTE — Patient Instructions (Addendum)
Your physician has recommended you make the following change in your medication: INCREASE your isosorbide MN 60 MG.  Your physician has requested that you have en exercise stress myoview. For further information please visit [email or web address] Please follow instruction sheet, as given. THIS WILL BE SCHEDULED WITHIN THE NEXT 3-4 WEEKS.  Your physician recommends that you return for lab work fasting.   Your physician recommends that you schedule a follow-up appointment in: 4-6 WEEKS.

## 2013-05-31 ENCOUNTER — Encounter: Payer: Self-pay | Admitting: Cardiovascular Disease

## 2013-05-31 NOTE — Progress Notes (Signed)
Patient ID: Troy [name], male   DOB: Aug 10, 1951, 62 y.o.   MRN: [id number]     HPI: Troy [name], is a 62 y.o. male presents to the office for a 8 month cardiology follow up evaluation.  [name] is a 62 year old gentleman who suffered an acute coronary syndrome in March 2006. He underwent CABG surgery with LIMA to the LAD, vein to the diagonal, vein to the obtuse marginal, and vein to the PDA. November 2010 he underwent him to wrench into the ostium of the proximal right coronary artery graft as well as native diagonal since the graft that supplied the diagonal was found to be occluded. Unfortunately, he is continued to have significant tobacco use. He has a history of hyperlipidemia, hypertension, and uses supplemental oxygen at night. At times he does note some episodes of chest pain which have improved with Ranexa therapy. He still notes some mild episodes of chest discomfort intermittently he does not shortness of breath.  Past Medical History  Diagnosis Date  . COPD (chronic obstructive pulmonary disease)   . Anxiety   . Dyslipidemia   . Coronary artery disease 2006    Acute coronary syndrome in March 2006.   [name] Hypertension 10/09/2010    EF 40-45%    Past Surgical History  Procedure Laterality Date  . Cardiac surgery    . Cardiac catheterization    . Coronary stent placement  2010  . Arterial evalation       no evidence of ICA stenosis  . Coronary artery bypass graft  2006    Post CABG surgery with a LIMA to the LAD, a vein to the diagonal ,,a vein to the the obtuse marginal and vein to the PDA.    Allergies  Allergen Reactions  . Codeine Nausea And Vomiting    Pt states when he takes codeine, he throws up blood  . Niacin And Related     "made his body feel hot & he had a hard time breathing"    Current Outpatient Prescriptions  Medication Sig Dispense Refill  . ALPRAZolam (XANAX) 0.25 MG tablet Take 0.25 mg by mouth at bedtime as needed for sleep.      [name]  atorvastatin (LIPITOR) 40 MG tablet Take 40 mg by mouth daily.      . busPIRone (BUSPAR) 5 MG tablet Take 5 mg by mouth 2 (two) times daily.       . clopidogrel (PLAVIX) 75 MG tablet TAKE 1 TABLET BY MOUTH DAILY WITH A MEAL  30 tablet  5  . famotidine (PEPCID) 20 MG tablet TAKE 1 TABLET BY MOUTH DAILY  30 tablet  5  . Fluticasone-Salmeterol (ADVAIR) 100-50 MCG/DOSE AEPB Inhale 1 puff into the lungs every 12 (twelve) hours.        . Ipratropium-Albuterol (COMBIVENT) 20-100 MCG/ACT AERS respimat Inhale 1 puff into the lungs every 6 (six) hours.      [name] losartan-hydrochlorothiazide (HYZAAR) 100-12.5 MG per tablet Take 1 tablet by mouth daily.  30 tablet  6  . metoprolol (TOPROL-XL) 100 MG 24 hr tablet Take 50 mg by mouth 2 (two) times daily.        . nitroGLYCERIN (NITROSTAT) 0.4 MG SL tablet Place 1 tablet (0.4 mg total) under the tongue every 5 (five) minutes as needed for chest pain.  25 tablet  2  . ranolazine (RANEXA) 500 MG 12 hr tablet Take 2 tablets (1,000 mg total) by mouth 2 (two) times daily.  100 tablet  5  .  tiotropium (SPIRIVA) 18 MCG inhalation capsule Place 18 mcg into inhaler and inhale daily.        . isosorbide mononitrate (IMDUR) 60 MG 24 hr tablet Take 1 tablet (60 mg total) by mouth daily.  90 tablet  3   No current facility-administered medications for this visit.    History   Social History  . Marital Status: Married    Spouse Name: N/A    Number of Children: N/A  . Years of Education: N/A   Occupational History  . Not on file.   Social History Main Topics  . Smoking status: Current Every Day Smoker -- 2.00 packs/day for 50 years    Types: Cigarettes  . Smokeless tobacco: Former Neurosurgeon    Types: Chew    Quit date: 01/23/2013  . Alcohol Use: No  . Drug Use: Not on file  . Sexual Activity: Not on file   Other Topics Concern  . Not on file   Social History Narrative  . No narrative on file    Family History  Problem Relation Age of Onset  . Heart attack  Father 23  . Hypertension Mother     ROS is negative for fevers, chills or night sweats.  He admits to wheezing. He denies presyncope or syncope. He does note some mild episodes of chest fullness. He denies bleeding. He denies abdominal pain. There is no nausea or vomiting the He denies significant leg swelling. He does cough.  Other system review is negative.  PE BP 108/64  Pulse 75  Ht 5\' 8"  (1.727 m)  Wt 213 lb 6.4 oz (96.798 kg)  BMI 32.46 kg/m2  General: Alert, oriented, no distress.  Skin: normal turgor, no rashes HEENT: Normocephalic, atraumatic. Pupils round and reactive; sclera anicteric;no lid lag.  Nose without nasal septal hypertrophy Mouth/Parynx benign; Mallinpatti scale 3 Neck: No JVD, no carotid briuts Lungs: Diffusely decreased breath sounds; no wheezing or rales Heart: RRR, s1 s2 normal over 6 systolic murmur Abdomen: soft, nontender; no hepatosplenomehaly, BS+; abdominal aorta nontender and not dilated by palpation. Pulses 2+ Extremities: no clubbing cyanosis or edema, Homan's sign negative  Neurologic: grossly nonfocal  ECG: Sinus rhythm at 75 beats per minute. He does have ST-T abnormalities which appear slightly more pronounced anterolaterally than previously.  LABS:  BMET    Component Value Date/Time   NA 138 10/21/2011 0532   K 4.0 10/21/2011 0532   CL 101 10/21/2011 0532   CO2 29 10/21/2011 0532   GLUCOSE 125* 10/21/2011 0532   BUN 11 10/21/2011 0532   CREATININE 0.97 10/21/2011 0532   CALCIUM 8.9 10/21/2011 0532   GFRNONAA 87* 10/21/2011 0532   GFRAA >90 10/21/2011 0532     Hepatic Function Panel     Component Value Date/Time   PROT 7.1 10/18/2011 1910   ALBUMIN 3.6 10/18/2011 1910   AST 20 10/18/2011 1910   ALT 19 10/18/2011 1910   ALKPHOS 67 10/18/2011 1910   BILITOT 0.4 10/18/2011 1910     CBC    Component Value Date/Time   WBC 11.7* 10/22/2011 0550   RBC 4.56 10/22/2011 0550   HGB 13.9 10/22/2011 0550   HCT 41.8 10/22/2011 0550   PLT 252 10/22/2011 0550   MCV 91.7  10/22/2011 0550   MCH 30.5 10/22/2011 0550   MCHC 33.3 10/22/2011 0550   RDW 13.5 10/22/2011 0550   LYMPHSABS 3.0 10/18/2011 1910   MONOABS 1.3* 10/18/2011 1910   EOSABS 0.2 10/18/2011 1910   BASOSABS 0.1  10/18/2011 1910     BNP    Component Value Date/Time   PROBNP 58.0 09/04/2009 0545    Lipid Panel     Component Value Date/Time   CHOL  Value: 146        ATP III CLASSIFICATION:  <200     mg/dL   Desirable  161-096  mg/dL   Borderline High  >=045    mg/dL   High        40/98/1191 0334   TRIG 159* 09/04/2009 0334   HDL 22* 09/04/2009 0334   CHOLHDL 6.6 09/04/2009 0334   VLDL 32 09/04/2009 0334   LDLCALC  Value: 92        Total Cholesterol/HDL:CHD Risk Coronary Heart Disease Risk Table                     Men   Women  1/2 Average Risk   3.4   3.3  Average Risk       5.0   4.4  2 X Average Risk   9.6   7.1  3 X Average Risk  23.4   11.0        Use the calculated Patient Ratio above and the CHD Risk Table to determine the patient's CHD Risk.        ATP III CLASSIFICATION (LDL):  <100     mg/dL   Optimal  478-295  mg/dL   Near or Above                    Optimal  130-159  mg/dL   Borderline  621-308  mg/dL   High  >657     mg/dL   Very High 84/69/6295 0334     RADIOLOGY: No results found.    ASSESSMENT AND PLAN:  Mr. Frasier has established coronary artery disease and is 8 years status post his initial presentation with an acute coronary syndrome in March 2006. He is status post CABG revascularization surgery in 2006 and has undergone some intervention to the ostium and proximal RCA graft as well as the native diagonal vessel is the graft to this vessel was occluded. Unfortunately he continues to smoke cigarettes. He does have significant airflow limitation documented on pulmonary function studies. He is doing well with Ranexa 1000 mg twice a day. Further titrate his Imdur to 60 mg. I am scheduling him for a LexiScan Myoview study. I again counseled him on smoking cessation and its absolute importance.  I will see him In the office in 4-6 weeks for followup evaluation.     Lennette Bihari, MD, [facility]  05/31/2013 4:10 PM

## 2013-06-15 ENCOUNTER — Ambulatory Visit (HOSPITAL_COMMUNITY)
Admission: RE | Admit: 2013-06-15 | Discharge: 2013-06-15 | Disposition: A | Discharge status: Home / Self Care | Payer: Medicare Other | Source: Ambulatory Visit | Attending: Cardiovascular Disease | Admitting: Cardiovascular Disease

## 2013-06-15 DIAGNOSIS — I252 Old myocardial infarction: Secondary | ICD-10-CM | POA: Insufficient documentation

## 2013-06-15 DIAGNOSIS — R0989 Other specified symptoms and signs involving the circulatory and respiratory systems: Secondary | ICD-10-CM | POA: Insufficient documentation

## 2013-06-15 DIAGNOSIS — R0609 Other forms of dyspnea: Secondary | ICD-10-CM | POA: Insufficient documentation

## 2013-06-15 DIAGNOSIS — J4489 Other specified chronic obstructive pulmonary disease: Secondary | ICD-10-CM | POA: Insufficient documentation

## 2013-06-15 DIAGNOSIS — Z951 Presence of aortocoronary bypass graft: Secondary | ICD-10-CM | POA: Insufficient documentation

## 2013-06-15 DIAGNOSIS — R079 Chest pain, unspecified: Secondary | ICD-10-CM | POA: Insufficient documentation

## 2013-06-15 DIAGNOSIS — R42 Dizziness and giddiness: Secondary | ICD-10-CM | POA: Insufficient documentation

## 2013-06-15 DIAGNOSIS — I2510 Atherosclerotic heart disease of native coronary artery without angina pectoris: Secondary | ICD-10-CM | POA: Insufficient documentation

## 2013-06-15 DIAGNOSIS — F17200 Nicotine dependence, unspecified, uncomplicated: Secondary | ICD-10-CM | POA: Insufficient documentation

## 2013-06-15 DIAGNOSIS — J449 Chronic obstructive pulmonary disease, unspecified: Secondary | ICD-10-CM | POA: Insufficient documentation

## 2013-06-15 DIAGNOSIS — Z8249 Family history of ischemic heart disease and other diseases of the circulatory system: Secondary | ICD-10-CM | POA: Insufficient documentation

## 2013-06-15 DIAGNOSIS — E663 Overweight: Secondary | ICD-10-CM | POA: Insufficient documentation

## 2013-06-15 DIAGNOSIS — I10 Essential (primary) hypertension: Secondary | ICD-10-CM | POA: Insufficient documentation

## 2013-06-15 DIAGNOSIS — R55 Syncope and collapse: Secondary | ICD-10-CM | POA: Insufficient documentation

## 2013-06-15 MED ORDER — TECHNETIUM TC 99M SESTAMIBI GENERIC - CARDIOLITE
10.0000 | Freq: Once | INTRAVENOUS | Status: AC | PRN
  Administered 2013-06-15: 10 via INTRAVENOUS

## 2013-06-15 MED ORDER — TECHNETIUM TC 99M SESTAMIBI GENERIC - CARDIOLITE
30.0000 | Freq: Once | INTRAVENOUS | Status: AC | PRN
  Administered 2013-06-15: 30 via INTRAVENOUS

## 2013-06-15 NOTE — Procedures (Addendum)
[facility] [address] [place] [address] [place] [address] [phone]  Cardiology Nuclear Med Study  Troy [name] is a 62 y.o. male     MRN : [id number]     DOB: [birth year]  Procedure Date: 06/15/2013  Nuclear Med Background Indication for Stress Test:  Graft Patency and Stent Patency History:  COPD and CAD;MI-12/2004;CABG X4--2006;STENT/PTCA--2010 Cardiac Risk Factors: Family History - CAD, Hypertension, Lipids, Obesity and Smoker  Symptoms:  Chest Pain, DOE, Light-Headedness, Near Syncope and SOB   Nuclear Pre-Procedure Caffeine/Decaff Intake:  12:00am NPO After: 10AM   IV Site: R Hand  IV 0.9% NS with Angio Cath:  22g  Chest Size (in):  42"  IV Started by: Emmit Pomfret, RN  Height: 5\' 8"  (1.727 m)  Cup Size: n/a  BMI:  Body mass index is 32.39 kg/(m^2). Weight:  213 lb (96.616 kg)   Tech Comments:  N/A    Nuclear Med Study 1 or 2 day study: 1 day  Stress Test Type:  Stress  Order Authorizing Provider:  Nicki Guadalajara, MD   Resting Radionuclide: Technetium 71m Sestamibi  Resting Radionuclide Dose: 10.6 mCi   Stress Radionuclide:  Technetium 31m Sestamibi  Stress Radionuclide Dose: 30.5 mCi           Stress Protocol Rest HR: 99 Stress HR: 130  Rest BP: 139/91 Stress BP: 192/97  Exercise Time (min): 4 METS: 5.6   Predicted Max HR: 156 bpm % Max HR: 83.33 bpm Rate Pressure Product: 21308  Dose of Adenosine (mg):  n/a Dose of Lexiscan: n/a mg  Dose of Atropine (mg): n/a Dose of Dobutamine: n/a mcg/kg/min (at max HR)  Stress Test Technologist: Esperanza Sheets, CCT Nuclear Technologist: Koren Shiver, CNMT   Rest Procedure:  Myocardial perfusion imaging was performed at rest 45 minutes following the intravenous administration of Technetium 27m Sestamibi. Stress Procedure:  The patient performed treadmill exercise using a Bruce  Protocol for 4 minutes. The patient stopped due to marked SOB and denied any chest pain.  There  were no significant ST-T wave changes.  Technetium 39m Sestamibi was injected at peak exercise and myocardial perfusion imaging was performed after a brief delay.  Transient Ischemic Dilatation (Normal <1.22):  0.86 Lung/Heart Ratio (Normal <0.45):  0.34 QGS EDV:  72 ml QGS ESV:  38 ml LV Ejection Fraction: 48%  Signed by Koren Shiver, CNMT  PHYSICIAN INTERPRETATION  Rest ECG: NSR with non-specific ST-T wave changes  Stress ECG: No significant change from baseline ECG, No significant ST segment change suggestive of ischemia. and There are scattered PVCs.  QPS Raw Data Images:  Acquisition technically good; normal left ventricular size. Stress Images:  There is decreased uptake in the anterior wall. There is decreased uptake in the apex. There is minimal reversibility in this area.  A Moderate sized, medium to high intensity (mostly fixed) perfusion defect noted in the mid-distal anterior/anterolateral and apical wall .  This is consistent with prior LAD infarction. Rest Images:  There is decreased uptake in the anterior wall.  There is decreased uptake in the apex. Comparison with the stress images reveals no significant change. Subtraction (SDS):  There is a fixed defect that is most consistent with a previous infarction.   Minimal reversibility is appreciated, consistent with very mild peri-infarct ischemia.  Impression Exercise Capacity:  Poor exercise capacity. BP Response:  Hypertensive blood pressure response. Clinical Symptoms:  No significant symptoms noted. ECG Impression:  No significant ST segment change  suggestive of ischemia. Comparison with Prior Nuclear Study: No significant change from previous study  Overall Impression:  Low risk stress nuclear study with consistent evidence of a moderate sized distal anterior-apical infarction with very mild peri-infarct ischemia..  LV Wall Motion:  Mildly reduced overall LV function with distal anterior-apical dyskinesis & septal  wall motion consistent with prior Sternotomy.   Troy Lex, MD  06/15/2013 6:24 PM

## 2013-06-16 ENCOUNTER — Other Ambulatory Visit: Payer: Self-pay | Admitting: Cardiovascular Disease

## 2013-06-17 NOTE — Telephone Encounter (Signed)
Rx was sent to pharmacy electronically. 

## 2013-06-21 ENCOUNTER — Telehealth: Payer: Self-pay | Admitting: Cardiovascular Disease

## 2013-06-21 NOTE — Telephone Encounter (Signed)
Returning your call from Friday. °

## 2013-06-21 NOTE — Telephone Encounter (Signed)
Returned call.  Pt informed no documentation of a call to him from last week.  Asked Burna Mortimer, CMA and denied calling pt.  Informed pt he does have an appt on 9.16.14 at 2pm w/ Dr. Tresa Endo and the schedulers may have been attempting to call him to confirm his appt.  Pt verbalized understanding and confirmed appt.

## 2013-06-21 NOTE — Telephone Encounter (Signed)
Message forwarded to Scheduling Lorain Childes).

## 2013-06-25 LAB — COMPREHENSIVE METABOLIC PANEL
Albumin: 3.9 g/dL (ref 3.5–5.2)
Alkaline Phosphatase: 63 U/L (ref 39–117)
BUN: 12 mg/dL (ref 6–23)
Creat: 1.17 mg/dL (ref 0.50–1.35)
Glucose, Bld: 119 mg/dL — ABNORMAL HIGH (ref 70–99)
Potassium: 4.1 mEq/L (ref 3.5–5.3)
Total Bilirubin: 0.8 mg/dL (ref 0.3–1.2)

## 2013-06-25 LAB — CBC
HCT: 44.4 % (ref 39.0–52.0)
MCHC: 34.5 g/dL (ref 30.0–36.0)
Platelets: 267 10*3/uL (ref 150–400)
RDW: 14.5 % (ref 11.5–15.5)

## 2013-06-25 LAB — LIPID PANEL
HDL: 31 mg/dL — ABNORMAL LOW (ref >39)
LDL Cholesterol: 100 mg/dL — ABNORMAL HIGH (ref 0–99)
Total CHOL/HDL Ratio: 6 Ratio
Triglycerides: 270 mg/dL — ABNORMAL HIGH (ref <150)

## 2013-06-26 NOTE — Progress Notes (Signed)
Quick Note:  discuss @ 9/16/appointment. ______

## 2013-06-29 ENCOUNTER — Encounter: Payer: Self-pay | Admitting: Cardiovascular Disease

## 2013-06-29 ENCOUNTER — Other Ambulatory Visit: Payer: Self-pay | Admitting: Cardiovascular Disease

## 2013-06-29 ENCOUNTER — Ambulatory Visit (INDEPENDENT_AMBULATORY_CARE_PROVIDER_SITE_OTHER): Payer: Medicare Other | Admitting: Cardiovascular Disease

## 2013-06-29 VITALS — BP 120/80 | HR 80 | Ht 68.0 in | Wt 205.7 lb

## 2013-06-29 DIAGNOSIS — E785 Hyperlipidemia, unspecified: Secondary | ICD-10-CM

## 2013-06-29 DIAGNOSIS — J449 Chronic obstructive pulmonary disease, unspecified: Secondary | ICD-10-CM

## 2013-06-29 DIAGNOSIS — I10 Essential (primary) hypertension: Secondary | ICD-10-CM

## 2013-06-29 DIAGNOSIS — I255 Ischemic cardiomyopathy: Secondary | ICD-10-CM

## 2013-06-29 DIAGNOSIS — F17200 Nicotine dependence, unspecified, uncomplicated: Secondary | ICD-10-CM

## 2013-06-29 DIAGNOSIS — I2589 Other forms of chronic ischemic heart disease: Secondary | ICD-10-CM

## 2013-06-29 DIAGNOSIS — Z951 Presence of aortocoronary bypass graft: Secondary | ICD-10-CM

## 2013-06-29 MED ORDER — METOPROLOL SUCCINATE ER 100 MG PO TB24: ORAL_TABLET | ORAL | Status: DC

## 2013-06-29 NOTE — Patient Instructions (Addendum)
Your physician recommends that you schedule a follow-up appointment in: 6 MONTHS.  Your physician has recommended you make the following change in your medication: start the isosorbide prescription. Also start 2 fish oil sapsules daily.

## 2013-06-29 NOTE — Addendum Note (Signed)
Addended by: Neta Ehlers on: 06/29/2013 03:56 PM   Modules accepted: Orders, Medications

## 2013-06-29 NOTE — Telephone Encounter (Signed)
Rx was sent to pharmacy electronically. 

## 2013-07-01 ENCOUNTER — Encounter: Payer: Self-pay | Admitting: Cardiovascular Disease

## 2013-07-01 NOTE — Progress Notes (Signed)
Patient ID: Troy [name], male   DOB: Dec 28, 1950, 62 y.o.   MRN: [id number]     HPI: Troy [name], is a 62 y.o. male presents to the office for  cardiology follow up evaluation following his  recent nuclear perfusion study for recurrent chest pain.  Troy [name] is a 62 year old gentleman who suffered an acute coronary syndrome in March 2006. He underwent CABG surgery with LIMA to the LAD, vein to the diagonal, vein to the obtuse marginal, and vein to the PDA. November 2010 he underwent him to wrench into the ostium of the proximal right coronary artery graft as well as native diagonal since the graft that supplied the diagonal was found to be occluded. Unfortunately, he continue to have significant tobacco use. He has a history of hyperlipidemia, hypertension, and uses supplemental oxygen at night. At times he does note some episodes of chest pain which have improved with initiation of Ranexa therapy. He still notes some mild episodes of chest discomfort intermittently he does not shortness of breath. When I saw him one month ago again discussed the importance of smoking cessation. I recommended at that time that he titrate his isosorbide mononitrate from 30 mg to 60 mg and gave him a new prescription. I scheduled him for a nuclear perfusion study. This was done in 06/15/2013 and was interpreted as a low risk study demonstrating moderate size distal antero-apical infarction with very mild preinfarction ischemia. He did have antero-apical dyskinesis and septal wall motion consistent with his prior sternotomy. He has felt improved. Unfortunately, he never did increase his Imdur. He presents for evaluation  Past Medical History  Diagnosis Date  . COPD (chronic obstructive pulmonary disease)   . Anxiety   . Dyslipidemia   . Coronary artery disease 2006    Acute coronary syndrome in March 2006.   [name] Hypertension 10/09/2010    EF 40-45%    Past Surgical History  Procedure Laterality Date  . Cardiac  surgery    . Cardiac catheterization    . Coronary stent placement  2010  . Arterial evalation       no evidence of ICA stenosis  . Coronary artery bypass graft  2006    Post CABG surgery with a LIMA to the LAD, a vein to the diagonal ,,a vein to the the obtuse marginal and vein to the PDA.    Allergies  Allergen Reactions  . Codeine Nausea And Vomiting    Pt states when he takes codeine, he throws up blood  . Niacin And Related     "made his body feel hot & he had a hard time breathing"    Current Outpatient Prescriptions  Medication Sig Dispense Refill  . ALPRAZolam (XANAX) 0.25 MG tablet Take 0.25 mg by mouth at bedtime as needed for sleep.      [name] atorvastatin (LIPITOR) 40 MG tablet Take 40 mg by mouth daily.      . busPIRone (BUSPAR) 5 MG tablet Take 5 mg by mouth 2 (two) times daily.       . clopidogrel (PLAVIX) 75 MG tablet TAKE 1 TABLET BY MOUTH DAILY WITH A MEAL  30 tablet  5  . famotidine (PEPCID) 20 MG tablet TAKE 1 TABLET BY MOUTH DAILY  30 tablet  5  . Fluticasone-Salmeterol (ADVAIR) 100-50 MCG/DOSE AEPB Inhale 1 puff into the lungs every 12 (twelve) hours.        . Ipratropium-Albuterol (COMBIVENT) 20-100 MCG/ACT AERS respimat Inhale 1 puff into the lungs  every 6 (six) hours.      . isosorbide mononitrate (IMDUR) 30 MG 24 hr tablet Take 30 mg by mouth daily.      [name] losartan-hydrochlorothiazide (HYZAAR) 100-12.5 MG per tablet Take 1 tablet by mouth daily.  30 tablet  6  . RANEXA 1000 MG SR tablet TAKE 1 TABLET EVERY 12 HOURS.  60 tablet  6  . tiotropium (SPIRIVA) 18 MCG inhalation capsule Place 18 mcg into inhaler and inhale daily.        . metoprolol succinate (TOPROL-XL) 100 MG 24 hr tablet Take 0.5 tablet (50mg  total) by mouth twice daily.  30 tablet  11  . nitroGLYCERIN (NITROSTAT) 0.4 MG SL tablet Place 1 tablet (0.4 mg total) under the tongue every 5 (five) minutes as needed for chest pain.  25 tablet  2   No current facility-administered medications for this visit.     History   Social History  . Marital Status: Married    Spouse Name: N/A    Number of Children: N/A  . Years of Education: N/A   Occupational History  . Not on file.   Social History Main Topics  . Smoking status: Current Every Day Smoker -- 2.00 packs/day for 50 years    Types: Cigarettes  . Smokeless tobacco: Former Neurosurgeon    Types: Chew    Quit date: 01/23/2013  . Alcohol Use: No  . Drug Use: Not on file  . Sexual Activity: Not on file   Other Topics Concern  . Not on file   Social History Narrative  . No narrative on file    Family History  Problem Relation Age of Onset  . Heart attack Father 74  . Hypertension Mother    Socially he continues to smoke.  ROS is negative for fevers, chills or night sweats.  He admits to wheezing. He denies presyncope or syncope. He does note some mild episodes of chest fullness. He denies bleeding. He denies abdominal pain. There is no nausea or vomiting the He denies significant leg swelling. He does cough.  Other system review is negative.  PE BP 120/80  Pulse 80  Ht 5\' 8"  (1.727 m)  Wt 205 lb 11.2 oz (93.305 kg)  BMI 31.28 kg/m2  General: Alert, oriented, no distress.  Skin: normal turgor, no rashes HEENT: Normocephalic, atraumatic. Pupils round and reactive; sclera anicteric;no lid lag.  Nose without nasal septal hypertrophy Mouth/Parynx benign; Mallinpatti scale 3 Neck: No JVD, no carotid briuts Lungs: Diffusely decreased breath sounds; no wheezing or rales Heart: RRR, s1 s2 normal over 6 systolic murmur Abdomen: soft, nontender; no hepatosplenomehaly, BS+; abdominal aorta nontender and not dilated by palpation. Pulses 2+ Extremities: no clubbing cyanosis or edema, Homan's sign negative  Neurologic: grossly nonfocal   LABS:  BMET    Component Value Date/Time   NA 140 06/25/2013 0816   K 4.1 06/25/2013 0816   CL 105 06/25/2013 0816   CO2 30 06/25/2013 0816   GLUCOSE 119* 06/25/2013 0816   BUN 12 06/25/2013 0816    CREATININE 1.17 06/25/2013 0816   CREATININE 0.97 10/21/2011 0532   CALCIUM 9.1 06/25/2013 0816   GFRNONAA 87* 10/21/2011 0532   GFRAA >90 10/21/2011 0532     Hepatic Function Panel     Component Value Date/Time   PROT 7.1 06/25/2013 0816   ALBUMIN 3.9 06/25/2013 0816   AST 18 06/25/2013 0816   ALT 22 06/25/2013 0816   ALKPHOS 63 06/25/2013 0816   BILITOT 0.8 06/25/2013  0816     CBC    Component Value Date/Time   WBC 10.4 06/25/2013 0816   RBC 4.89 06/25/2013 0816   HGB 15.3 06/25/2013 0816   HCT 44.4 06/25/2013 0816   PLT 267 06/25/2013 0816   MCV 90.8 06/25/2013 0816   MCH 31.3 06/25/2013 0816   MCHC 34.5 06/25/2013 0816   RDW 14.5 06/25/2013 0816   LYMPHSABS 3.0 10/18/2011 1910   MONOABS 1.3* 10/18/2011 1910   EOSABS 0.2 10/18/2011 1910   BASOSABS 0.1 10/18/2011 1910     BNP    Component Value Date/Time   PROBNP 58.0 09/04/2009 0545    Lipid Panel     Component Value Date/Time   CHOL 185 06/25/2013 0816   TRIG 270* 06/25/2013 0816   HDL 31* 06/25/2013 0816   CHOLHDL 6.0 06/25/2013 0816   VLDL 54* 06/25/2013 0816   LDLCALC 100* 06/25/2013 0816     RADIOLOGY: No results found.    ASSESSMENT AND PLAN:   Mr. Deman has established coronary artery disease and is 8 years status post his initial presentation with an acute coronary syndrome in March 2006. He is status post CABG revascularization surgery in 2006 and has undergone some intervention to the ostium and proximal RCA graft as well as the native diagonal vessel is the graft to this vessel was occluded. Unfortunately he continues to smoke cigarettes. He does have significant airflow limitation documented on pulmonary function studies. His most recent DEXA scan Myoview study is low risk and demonstrates moderate size distal antero-apical infarction with very minimal peri-infarction ischemia. He has done well with Ranexa 1000 mg twice a day. I again recommended that he increase his his Imdur to 60 mg daily. Again discussed the importance  of smoking cessation. With his recent laboratory showing significant triglyceride elevation at 270 and increased VLDL with low HDL I recommended that he initiate fish oil at 2 capsules daily and we will continue his atorvastatin 40 mg daily. His blood pressure today is well controlled on combination therapy consisting of losartan/HCTZ 100/12.5, Toprol-XL 100 mg as well as his nitrates. I'll see him in 6 months for followup evaluation or sooner if problems arise.   Lennette Bihari, MD, [facility]  07/01/2013 5:48 PM

## 2013-08-25 ENCOUNTER — Other Ambulatory Visit: Payer: Self-pay | Admitting: *Deleted

## 2013-08-25 ENCOUNTER — Telehealth: Payer: Self-pay | Admitting: *Deleted

## 2013-08-25 MED ORDER — METOPROLOL SUCCINATE ER 200 MG PO TB24: ORAL_TABLET | ORAL | Status: DC

## 2013-08-25 NOTE — Telephone Encounter (Signed)
Spoke with patient to confirm what dose of Toprol XL he has been taking. He states that he is taking 100 mg twice daily. (our records show 100 mg  1/2 tablet twice daily.) patients informs me that he takes 1 entire pill twice daily. I then told him that since the insurance company refuses to pay for this medication to be taken twice a day because it is available in a 200 mg strength Dr. Tresa Endo instructed me V.O. to call in 200 mg tablets to take 1/2 tablet twice a day. Dr. Tresa Endo says that he feels that 200 mg may be too much for patient to take in one dose. Patient and his lady friend Lurena Joiner ) were on speaker phone and voiced understanding of metoprolol orders. New prescription sent to patients pharmacy.

## 2013-09-27 ENCOUNTER — Other Ambulatory Visit: Payer: Self-pay | Admitting: Cardiovascular Disease

## 2013-09-27 NOTE — Telephone Encounter (Signed)
Rx was sent to pharmacy electronically. 

## 2013-11-01 ENCOUNTER — Other Ambulatory Visit: Payer: Self-pay | Admitting: Cardiovascular Disease

## 2013-11-20 ENCOUNTER — Other Ambulatory Visit: Payer: Self-pay | Admitting: Cardiovascular Disease

## 2013-11-22 NOTE — Telephone Encounter (Signed)
Rx was sent to pharmacy electronically. 

## 2013-12-29 ENCOUNTER — Ambulatory Visit: Payer: Medicare Other | Admitting: Cardiovascular Disease

## 2014-01-03 ENCOUNTER — Encounter: Payer: Self-pay | Admitting: Cardiovascular Disease

## 2014-01-03 ENCOUNTER — Ambulatory Visit (INDEPENDENT_AMBULATORY_CARE_PROVIDER_SITE_OTHER): Payer: Medicare Other | Admitting: Cardiovascular Disease

## 2014-01-03 VITALS — BP 118/68 | HR 59 | Ht 68.0 in | Wt 214.6 lb

## 2014-01-03 DIAGNOSIS — J449 Chronic obstructive pulmonary disease, unspecified: Secondary | ICD-10-CM

## 2014-01-03 DIAGNOSIS — F17200 Nicotine dependence, unspecified, uncomplicated: Secondary | ICD-10-CM

## 2014-01-03 DIAGNOSIS — I2510 Atherosclerotic heart disease of native coronary artery without angina pectoris: Secondary | ICD-10-CM

## 2014-01-03 DIAGNOSIS — R5383 Other fatigue: Secondary | ICD-10-CM

## 2014-01-03 DIAGNOSIS — Z951 Presence of aortocoronary bypass graft: Secondary | ICD-10-CM

## 2014-01-03 DIAGNOSIS — E785 Hyperlipidemia, unspecified: Secondary | ICD-10-CM

## 2014-01-03 DIAGNOSIS — I255 Ischemic cardiomyopathy: Secondary | ICD-10-CM

## 2014-01-03 DIAGNOSIS — I2589 Other forms of chronic ischemic heart disease: Secondary | ICD-10-CM

## 2014-01-03 DIAGNOSIS — I10 Essential (primary) hypertension: Secondary | ICD-10-CM

## 2014-01-03 DIAGNOSIS — R5381 Other malaise: Secondary | ICD-10-CM

## 2014-01-03 DIAGNOSIS — IMO0001 Reserved for inherently not codable concepts without codable children: Secondary | ICD-10-CM

## 2014-01-03 NOTE — Patient Instructions (Signed)
Your physician recommends that you return for lab work fasting. Nothing to eat or drink after midnight prior to going to the lab.  Your physician recommends that you schedule a follow-up appointment in: 6 months.

## 2014-01-09 ENCOUNTER — Encounter: Payer: Self-pay | Admitting: Cardiovascular Disease

## 2014-01-09 NOTE — Progress Notes (Signed)
Patient ID: Troy [name], male   DOB: Sep 08, 1951, 63 y.o.   MRN: [id number]      HPI: Troy [name] is a 63 y.o. male presents to the office for  cardiology follow up evaluation.  Troy [name] is a 63 year old gentleman who suffered an acute coronary syndrome in March 2006. He underwent CABG surgery with LIMA to the LAD, vein to the diagonal, vein to the obtuse marginal, and vein to the PDA. November 2010 he underwent PCI to the ostium of the proximal right coronary artery graft as well as native diagonal since the graft that supplied the diagonal was found to be occluded. Last catheterization was in January 2013 which showed a patent stent in the vein graft to the RCA. As LIMA was small and nearly atretic which did not reach the LAD. Vein graft to diagonal was occluded. The vein graft to the marginal vessel was widely patent. Unfortunately, he continue to have significant tobacco use. He has a history of hyperlipidemia, hypertension, and uses supplemental oxygen at night. At times he does note some episodes of chest pain which have improved with initiation of Ranexa therapy. He still notes some mild episodes of chest discomfort intermittently he does not shortness of breath. When I saw him 6 months ago I again discussed the importance of smoking cessation. I recommended at that time that he titrate his isosorbide mononitrate from 30 mg to 60 mg and gave him a new prescription. I scheduled him for a nuclear perfusion study. This was done in 06/15/2013 and was interpreted as a low risk study demonstrating moderate size distal antero-apical infarction with very mild preinfarction ischemia. He did have antero-apical dyskinesis and septal wall motion consistent with his prior sternotomy. He has felt improved.  Troy [name] continues to smoke cigarettes. He does note shortness of breath with activity. He denies recent increasing episodes of chest pain. He is now on Ranexa 1000 mg twice a day. He remains on dual  antiplatelet therapy and lipid intervention as well as medicines for blood pressure control.  Past Medical History  Diagnosis Date  . COPD (chronic obstructive pulmonary disease)   . Anxiety   . Dyslipidemia   . Coronary artery disease 2006    Acute coronary syndrome in March 2006.   [name] Hypertension 10/09/2010    EF 40-45%    Past Surgical History  Procedure Laterality Date  . Cardiac surgery    . Cardiac catheterization    . Coronary stent placement  2010  . Arterial evalation       no evidence of ICA stenosis  . Coronary artery bypass graft  2006    Post CABG surgery with a LIMA to the LAD, a vein to the diagonal ,,a vein to the the obtuse marginal and vein to the PDA.    Allergies  Allergen Reactions  . Codeine Nausea And Vomiting    Pt states when he takes codeine, he throws up blood  . Niacin And Related     "made his body feel hot & he had a hard time breathing"    Current Outpatient Prescriptions  Medication Sig Dispense Refill  . ALPRAZolam (XANAX) 0.25 MG tablet Take 0.25 mg by mouth at bedtime as needed for sleep.      [name] amLODipine (NORVASC) 10 MG tablet Take 10 mg by mouth daily.      [name] atorvastatin (LIPITOR) 40 MG tablet TAKE 1 TABLET DAILY AT BEDTIME.  30 tablet  5  . busPIRone (BUSPAR)  5 MG tablet Take 5 mg by mouth 2 (two) times daily.       . clopidogrel (PLAVIX) 75 MG tablet TAKE 1 TABLET BY MOUTH DAILY WITH A MEAL  30 tablet  7  . famotidine (PEPCID) 20 MG tablet TAKE 1 TABLET BY MOUTH DAILY  30 tablet  5  . Fluticasone-Salmeterol (ADVAIR) 100-50 MCG/DOSE AEPB Inhale 1 puff into the lungs every 12 (twelve) hours.        . isosorbide mononitrate (IMDUR) 60 MG 24 hr tablet Take 60 mg by mouth daily.      [name] losartan-hydrochlorothiazide (HYZAAR) 100-12.5 MG per tablet TAKE 1 TABLET BY MOUTH DAILY.  30 tablet  9  . metoprolol (TOPROL XL) 200 MG 24 hr tablet Take 1/2 tablet twice daily.  30 tablet  11  . RANEXA 1000 MG SR tablet TAKE 1 TABLET EVERY 12 HOURS.  60  tablet  6  . tiotropium (SPIRIVA) 18 MCG inhalation capsule Place 18 mcg into inhaler and inhale daily.        . Ipratropium-Albuterol (COMBIVENT) 20-100 MCG/ACT AERS respimat Inhale 1 puff into the lungs every 6 (six) hours.      . nitroGLYCERIN (NITROSTAT) 0.4 MG SL tablet Place 1 tablet (0.4 mg total) under the tongue every 5 (five) minutes as needed for chest pain.  25 tablet  2   No current facility-administered medications for this visit.    History   Social History  . Marital Status: Married    Spouse Name: N/A    Number of Children: N/A  . Years of Education: N/A   Occupational History  . Not on file.   Social History Main Topics  . Smoking status: Current Every Day Smoker -- 2.00 packs/day for 50 years    Types: Cigarettes  . Smokeless tobacco: Former Systems developer    Types: [place] date: 01/23/2013  . Alcohol Use: No  . Drug Use: Not on file  . Sexual Activity: Not on file   Other Topics Concern  . Not on file   Social History Narrative  . No narrative on file    Family History  Problem Relation Age of Onset  . Heart attack Father 37  . Hypertension Mother    Socially he continues to smoke.  ROS is negative for fevers, chills or night sweats.  He denies change in vision. He does have some difficulty with hearing. He is unaware of lymphadenopathy. He admits to wheezing.  He does have significant COPDHe denies presyncope or syncope. He does note some mild episodes of chest fullness. He denies bleeding. He denies abdominal pain. There is no nausea or vomiting. He denies blood in stool or urine. He denies significant leg swelling. He does cough.  Other system review is negative.  PE BP 118/68  Pulse 59  Ht 5\' 8"  (1.727 m)  Wt 214 lb 9.6 oz (97.342 kg)  BMI 32.64 kg/m2  General: Alert, oriented, no distress.  Skin: normal turgor, no rashes HEENT: Normocephalic, atraumatic. Pupils round and reactive; sclera anicteric;no lid lag.  Nose without nasal septal  hypertrophy Mouth/Parynx benign; Mallinpatti scale 3 Neck: No JVD, no carotid bruits;  Chest wall: nontender to palpation Lungs: Diffusely decreased breath sounds; no wheezing or rales Heart: RRR, s1 s2 MWNUUV;2/ 6 systolic murmur; no diastolic murmur. No S3 gallop. Abdomen: soft, nontender; no hepatosplenomehaly, BS+; abdominal aorta nontender and not dilated by palpation. Back: No CVA tenderness Pulses 2+ Extremities: no clubbing cyanosis or edema, Homan's  sign negative  Neurologic: grossly nonfocal Psychological: Normal affect and mood.  ECG (independently read by me): Sinus rhythm at 59 beats per minute. Nondiagnostic anterolateral T changes  LABS:  BMET    Component Value Date/Time   NA 140 06/25/2013 0816   K 4.1 06/25/2013 0816   CL 105 06/25/2013 0816   CO2 30 06/25/2013 0816   GLUCOSE 119* 06/25/2013 0816   BUN 12 06/25/2013 0816   CREATININE 1.17 06/25/2013 0816   CREATININE 0.97 10/21/2011 0532   CALCIUM 9.1 06/25/2013 0816   GFRNONAA 87* 10/21/2011 0532   GFRAA >90 10/21/2011 0532     Hepatic Function Panel     Component Value Date/Time   PROT 7.1 06/25/2013 0816   ALBUMIN 3.9 06/25/2013 0816   AST 18 06/25/2013 0816   ALT 22 06/25/2013 0816   ALKPHOS 63 06/25/2013 0816   BILITOT 0.8 06/25/2013 0816     CBC    Component Value Date/Time   WBC 10.4 06/25/2013 0816   RBC 4.89 06/25/2013 0816   HGB 15.3 06/25/2013 0816   HCT 44.4 06/25/2013 0816   PLT 267 06/25/2013 0816   MCV 90.8 06/25/2013 0816   MCH 31.3 06/25/2013 0816   MCHC 34.5 06/25/2013 0816   RDW 14.5 06/25/2013 0816   LYMPHSABS 3.0 10/18/2011 1910   MONOABS 1.3* 10/18/2011 1910   EOSABS 0.2 10/18/2011 1910   BASOSABS 0.1 10/18/2011 1910     BNP    Component Value Date/Time   PROBNP 58.0 09/04/2009 0545    Lipid Panel     Component Value Date/Time   CHOL 185 06/25/2013 0816   TRIG 270* 06/25/2013 0816   HDL 31* 06/25/2013 0816   CHOLHDL 6.0 06/25/2013 0816   VLDL 54* 06/25/2013 0816   LDLCALC 100* 06/25/2013  0816     RADIOLOGY: No results found.    ASSESSMENT AND PLAN:  Troy [name] is a 63 year old male with established coronary artery disease and is 9 years status post his initial presentation with an acute coronary syndrome in March 2006. He is status post CABG revascularization surgery in 2006 and has undergone some intervention to the ostium and proximal RCA graft as well as the native diagonal vessel is the graft to this vessel was occluded. Unfortunately he continues to smoke cigarettes. He does have significant airflow limitation documented on pulmonary function studies. His most recent [place] study was low risk and demonstrates moderate size distal antero-apical infarction with very minimal peri-infarction ischemia. He has done well with Ranexa 1000 mg twice a day. He has tolerated the increase isosorbide to 60 mg and is having less chest pain. His blood pressure today is stable. It is my recommendation that he continue dual antiplatelet therapy indefinitely. We didn't disclose discussed the absolute importance of smoking cessation. His rhythm is stable. Repeat laboratory will be obtained in the fasting state. I will see him in 6 months for cardiology reevaluation.  Troy Sine, MD, [facility]  01/09/2014 11:43 AM

## 2014-01-24 ENCOUNTER — Other Ambulatory Visit: Payer: Self-pay | Admitting: *Deleted

## 2014-01-24 MED ORDER — RANOLAZINE ER 1000 MG PO TB12: ORAL_TABLET | ORAL | Status: DC

## 2014-01-24 NOTE — Telephone Encounter (Signed)
Rx was sent to pharmacy electronically. 

## 2014-03-14 ENCOUNTER — Telehealth: Payer: Self-pay | Admitting: Cardiovascular Disease

## 2014-03-14 NOTE — Telephone Encounter (Signed)
Pt feet and leg is swelling real bad. He wants you to call in something for this. Please call this to [phone]. Please call and let him know what is decided,

## 2014-03-14 NOTE — Telephone Encounter (Signed)
Pt. Encouraged to call PCP and if he thinks we need to see him let us know, pt stated swelling has been there for two weeks, pt. Stated understanding

## 2014-05-05 ENCOUNTER — Other Ambulatory Visit: Payer: Self-pay

## 2014-05-05 MED ORDER — FAMOTIDINE 20 MG PO TABS: 20.0000 mg | ORAL_TABLET | Freq: Every day | ORAL | Status: DC

## 2014-05-05 NOTE — Telephone Encounter (Signed)
Rx was sent to pharmacy electronically. 

## 2014-05-12 ENCOUNTER — Other Ambulatory Visit: Payer: Self-pay | Admitting: *Deleted

## 2014-05-12 MED ORDER — ATORVASTATIN CALCIUM 40 MG PO TABS: ORAL_TABLET | ORAL | Status: DC

## 2014-05-12 NOTE — Telephone Encounter (Signed)
Rx was sent to pharmacy electronically. 

## 2014-07-11 ENCOUNTER — Other Ambulatory Visit: Payer: Self-pay | Admitting: Cardiovascular Disease

## 2014-07-12 NOTE — Telephone Encounter (Signed)
Rx was sent to pharmacy electronically. 

## 2014-07-13 ENCOUNTER — Emergency Department: Payer: Self-pay | Admitting: Emergency Medicine

## 2014-07-13 LAB — TROPONIN I: Troponin-I: <0.02 ng/mL

## 2014-07-13 LAB — COMPREHENSIVE METABOLIC PANEL
ALBUMIN: 3.5 g/dL (ref 3.4–5.0)
ALK PHOS: 78 U/L
ALT: 23 U/L
Anion Gap: 6 — ABNORMAL LOW (ref 7–16)
BILIRUBIN TOTAL: 0.8 mg/dL (ref 0.2–1.0)
BUN: 16 mg/dL (ref 7–18)
CALCIUM: 8.7 mg/dL (ref 8.5–10.1)
CHLORIDE: 107 mmol/L (ref 98–107)
CO2: 26 mmol/L (ref 21–32)
Creatinine: 1.49 mg/dL — ABNORMAL HIGH (ref 0.60–1.30)
EGFR (Non-African Amer.): 50 — ABNORMAL LOW
Glucose: 102 mg/dL — ABNORMAL HIGH (ref 65–99)
Osmolality: 279 (ref 275–301)
Potassium: 4.1 mmol/L (ref 3.5–5.1)
SGOT(AST): 14 U/L — ABNORMAL LOW (ref 15–37)
SODIUM: 139 mmol/L (ref 136–145)
Total Protein: 7.3 g/dL (ref 6.4–8.2)

## 2014-07-13 LAB — PRO B NATRIURETIC PEPTIDE: B-TYPE NATIURETIC PEPTID: 373 pg/mL — AB (ref 0–125)

## 2014-07-13 LAB — CBC
HCT: 41.8 % (ref 40.0–52.0)
HGB: 13.9 g/dL (ref 13.0–18.0)
MCH: 30.9 pg (ref 26.0–34.0)
MCHC: 33.3 g/dL (ref 32.0–36.0)
MCV: 93 fL (ref 80–100)
Platelet: 248 10*3/uL (ref 150–440)
RBC: 4.5 10*6/uL (ref 4.40–5.90)
RDW: 13.7 % (ref 11.5–14.5)
WBC: 13.1 10*3/uL — ABNORMAL HIGH (ref 3.8–10.6)

## 2014-08-02 ENCOUNTER — Other Ambulatory Visit: Payer: Self-pay | Admitting: Cardiovascular Disease

## 2014-08-02 NOTE — Telephone Encounter (Signed)
Rx was sent to pharmacy electronically. 

## 2014-08-03 ENCOUNTER — Other Ambulatory Visit: Payer: Self-pay

## 2014-08-03 MED ORDER — CLOPIDOGREL BISULFATE 75 MG PO TABS: 75.0000 mg | ORAL_TABLET | Freq: Every day | ORAL | Status: DC

## 2014-08-03 NOTE — Telephone Encounter (Signed)
Rx sent to pharmacy   

## 2014-08-30 ENCOUNTER — Other Ambulatory Visit: Payer: Self-pay | Admitting: Cardiovascular Disease

## 2014-08-30 NOTE — Telephone Encounter (Signed)
Rx was sent to pharmacy electronically. 

## 2014-09-05 ENCOUNTER — Ambulatory Visit (INDEPENDENT_AMBULATORY_CARE_PROVIDER_SITE_OTHER): Payer: Medicare Other | Admitting: Cardiovascular Disease

## 2014-09-05 ENCOUNTER — Encounter: Payer: Self-pay | Admitting: Cardiovascular Disease

## 2014-09-05 VITALS — BP 110/60 | HR 69 | Ht 68.0 in | Wt 217.7 lb

## 2014-09-05 DIAGNOSIS — I2510 Atherosclerotic heart disease of native coronary artery without angina pectoris: Secondary | ICD-10-CM

## 2014-09-05 DIAGNOSIS — I255 Ischemic cardiomyopathy: Secondary | ICD-10-CM

## 2014-09-05 DIAGNOSIS — Z951 Presence of aortocoronary bypass graft: Secondary | ICD-10-CM

## 2014-09-05 DIAGNOSIS — E669 Obesity, unspecified: Secondary | ICD-10-CM

## 2014-09-05 DIAGNOSIS — I10 Essential (primary) hypertension: Secondary | ICD-10-CM

## 2014-09-05 DIAGNOSIS — F17200 Nicotine dependence, unspecified, uncomplicated: Secondary | ICD-10-CM

## 2014-09-05 DIAGNOSIS — J439 Emphysema, unspecified: Secondary | ICD-10-CM

## 2014-09-05 DIAGNOSIS — Z720 Tobacco use: Secondary | ICD-10-CM

## 2014-09-05 DIAGNOSIS — E785 Hyperlipidemia, unspecified: Secondary | ICD-10-CM

## 2014-09-05 NOTE — Patient Instructions (Signed)
Your physician wants you to follow-up in: 6 months or sooner if needed with Dr. [name]. No changes were made today in your therapy. You will receive a reminder letter in the mail two months in advance. If you don't receive a letter, please call our office to schedule the follow-up appointment.

## 2014-09-05 NOTE — Progress Notes (Signed)
Patient ID: Troy [name], male   DOB: 03/26/1951, 63 y.o.   MRN: [id number]     HPI: Troy [name] is a 63 y.o. male presents to the office for  cardiology follow up evaluation.  Troy [name] suffered an acute coronary syndrome in March 2006 and underwent CABG surgery with LIMA to the LAD, vein to the diagonal, vein to the obtuse marginal, and vein to the PDA. In November 2010 he underwent PCI to the ostium of the proximal  graft to the RCA as well as native diagonal since the graft that supplied the diagonal was found to be occluded. His last catheterization was in January 2013 which showed a patent stent in the vein graft to the RCA, the  LIMA was small and nearly atretic and did not reach the LAD. Vein graft to diagonal was occluded. The vein graft to the marginal vessel was widely patent. Unfortunately, he continue to have significant tobacco use. He has a history of hyperlipidemia, hypertension, and uses supplemental oxygen at night. At times he does note some episodes of chest pain which have improved with initiation of Ranexa therapy. He still notes some mild episodes of chest discomfort intermittently he does not shortness of breath. When I saw him last yearI again discussed the importance of smoking cessation. I recommended at that time that he titrate his isosorbide mononitrate from 30 mg to 60 mg and gave him a new prescription. I scheduled him for a nuclear perfusion study. This was done in 06/15/2013 and was interpreted as a low risk study demonstrating moderate size distal antero-apical infarction with very mild preinfarction ischemia. He did have antero-apical dyskinesis and septal wall motion consistent with his prior sternotomy. He has felt improved.  Since I saw him in March.  He tells me that he had quit smoking but again has resumed smoking.  He is now by himself.  He is on amlodipine 10 mg, isosorbide 60 mg losartan HCT 100/12.5 Toprol-XL 100 mg Ranexa 1000 g twice a day for his  hypertension as well as CAD.  He continues on dual antiplatelet therapy without bleeding issues.  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  He has noticed some left-sided vague chest cramps which are nonexertional.  He denies palpitations.  He does note burning in the feet.  Past Medical History  Diagnosis Date  . COPD (chronic obstructive pulmonary disease)   . Anxiety   . Dyslipidemia   . Coronary artery disease 2006    Acute coronary syndrome in March 2006.   [name] Hypertension 10/09/2010    EF 40-45%    Past Surgical History  Procedure Laterality Date  . Cardiac surgery    . Cardiac catheterization    . Coronary stent placement  2010  . Arterial evalation       no evidence of ICA stenosis  . Coronary artery bypass graft  2006    Post CABG surgery with a LIMA to the LAD, a vein to the diagonal ,,a vein to the the obtuse marginal and vein to the PDA.    Allergies  Allergen Reactions  . Codeine Nausea And Vomiting    Pt states when he takes codeine, he throws up blood  . Niacin And Related     "made his body feel hot & he had a hard time breathing"    Current Outpatient Prescriptions  Medication Sig Dispense Refill  . ADVAIR DISKUS 500-50 MCG/DOSE AEPB Inhale 1 puff into the lungs  2 (two) times daily as needed.  2  . ALPRAZolam (XANAX) 0.25 MG tablet Take 0.25 mg by mouth at bedtime as needed for sleep.    [name] amLODipine (NORVASC) 10 MG tablet Take 10 mg by mouth daily.    [name] atorvastatin (LIPITOR) 40 MG tablet TAKE 1 TABLET DAILY AT BEDTIME. 30 tablet 5  . busPIRone (BUSPAR) 7.5 MG tablet Take 1 tablet by mouth 2 (two) times daily.    . famotidine (PEPCID) 20 MG tablet Take 1 tablet (20 mg total) by mouth daily. 30 tablet 8  . Fluticasone-Salmeterol (ADVAIR) 100-50 MCG/DOSE AEPB Inhale 1 puff into the lungs every 12 (twelve) hours.      . Ipratropium-Albuterol (COMBIVENT) 20-100 MCG/ACT AERS respimat Inhale 1 puff into the lungs every 6 (six) hours.     . isosorbide mononitrate (IMDUR) 60 MG 24 hr tablet TAKE 1 TABLET BY MOUTH EVERY DAY 90 tablet 1  . losartan-hydrochlorothiazide (HYZAAR) 100-12.5 MG per tablet TAKE 1 TABLET BY MOUTH DAILY. 30 tablet 5  . metoprolol (TOPROL XL) 200 MG 24 hr tablet Take 1/2 tablet twice daily. 30 tablet 11  . RANEXA 1000 MG SR tablet TAKE 1 TABLET EVERY 12 HOURS. 60 tablet 4  . tiotropium (SPIRIVA) 18 MCG inhalation capsule Place 18 mcg into inhaler and inhale daily.      . clopidogrel (PLAVIX) 75 MG tablet Take 1 tablet (75 mg total) by mouth daily. 30 tablet 5  . nitroGLYCERIN (NITROSTAT) 0.4 MG SL tablet Place 1 tablet (0.4 mg total) under the tongue every 5 (five) minutes as needed for chest pain. 25 tablet 2   No current facility-administered medications for this visit.    History   Social History  . Marital Status: Married    Spouse Name: N/A    Number of Children: N/A  . Years of Education: N/A   Occupational History  . Not on file.   Social History Main Topics  . Smoking status: Current Every Day Smoker -- 2.00 packs/day for 50 years    Types: Cigarettes  . Smokeless tobacco: Former Systems developer    Types: [place] date: 01/23/2013  . Alcohol Use: No  . Drug Use: Not on file  . Sexual Activity: Not on file   Other Topics Concern  . Not on file   Social History Narrative    Family History  Problem Relation Age of Onset  . Heart attack Father 25  . Hypertension Mother    Socially he continues to smoke.  ROS General: Negative; No fevers, chills, or night sweats;  HEENT: Negative; No changes in vision or hearing, sinus congestion, difficulty swallowing Pulmonary: Positive for wheezing, shortness of breath,  Cardiovascular: Negative; No chest pain, presyncope, syncope, palpitations GI: Negative; No nausea, vomiting, diarrhea, or abdominal pain GU: Negative; No dysuria, hematuria, or difficulty voiding Musculoskeletal: Negative; no myalgias, joint pain, or  weakness Hematologic/Oncology: Negative; no easy bruising, bleeding Endocrine: Negative; no heat/cold intolerance; no diabetes Neuro: Positive for neuropathy Skin: Negative; No rashes or skin lesions Psychiatric: Negative; No behavioral problems, depression Sleep: Negative; No snoring, daytime sleepiness, hypersomnolence, bruxism, restless legs, hypnogognic hallucinations, no cataplexy Other comprehensive 14 point system review is negative.  PE Blood pressure 110/60, pulse 69, height 5 feet, weight 217.  By this index 33.11 kg/m, consistent with mild obesity. General: Alert, oriented, no distress.  Skin: normal turgor, no rashes HEENT: Normocephalic, atraumatic. Pupils round and reactive; sclera anicteric;no lid lag.  Nose without nasal septal hypertrophy Mouth/Parynx  benign; Mallinpatti scale 3 Neck: No JVD, no carotid bruits;  Chest wall: nontender to palpation Lungs: Diffusely decreased breath sounds; faint intermittent end expiratory wheezing Heart: RRR, s1 s2 NGEXBM;8/ 6 systolic murmur; no diastolic murmur. No S3 gallop. Abdomen: All central adiposity soft, nontender; no hepatosplenomehaly, BS+; abdominal aorta nontender and not dilated by palpation. Back: No CVA tenderness Pulses 2+ Extremities: no clubbing cyanosis or edema, Homan's sign negative  Neurologic: grossly nonfocal Psychological: Normal affect and mood.  ECG (independently read by me): Sinus rhythm at 69 bpm.  Nonspecific ST changes.  No ectopy.  QTc interval 458 ms  ECG (independently read by me): Sinus rhythm at 59 beats per minute. Nondiagnostic anterolateral T changes  LABS:  BMET    Component Value Date/Time   NA 140 06/25/2013 0816   K 4.1 06/25/2013 0816   CL 105 06/25/2013 0816   CO2 30 06/25/2013 0816   GLUCOSE 119* 06/25/2013 0816   BUN 12 06/25/2013 0816   CREATININE 1.17 06/25/2013 0816   CREATININE 0.97 10/21/2011 0532   CALCIUM 9.1 06/25/2013 0816   GFRNONAA 87* 10/21/2011 0532   GFRAA  >90 10/21/2011 0532     Hepatic Function Panel     Component Value Date/Time   PROT 7.1 06/25/2013 0816   ALBUMIN 3.9 06/25/2013 0816   AST 18 06/25/2013 0816   ALT 22 06/25/2013 0816   ALKPHOS 63 06/25/2013 0816   BILITOT 0.8 06/25/2013 0816     CBC    Component Value Date/Time   WBC 10.4 06/25/2013 0816   RBC 4.89 06/25/2013 0816   HGB 15.3 06/25/2013 0816   HCT 44.4 06/25/2013 0816   PLT 267 06/25/2013 0816   MCV 90.8 06/25/2013 0816   MCH 31.3 06/25/2013 0816   MCHC 34.5 06/25/2013 0816   RDW 14.5 06/25/2013 0816   LYMPHSABS 3.0 10/18/2011 1910   MONOABS 1.3* 10/18/2011 1910   EOSABS 0.2 10/18/2011 1910   BASOSABS 0.1 10/18/2011 1910     BNP    Component Value Date/Time   PROBNP 58.0 09/04/2009 0545    Lipid Panel     Component Value Date/Time   CHOL 185 06/25/2013 0816   TRIG 270* 06/25/2013 0816   HDL 31* 06/25/2013 0816   CHOLHDL 6.0 06/25/2013 0816   VLDL 54* 06/25/2013 0816   LDLCALC 100* 06/25/2013 0816     RADIOLOGY: No results found.    ASSESSMENT AND PLAN: Troy [name] is a 63 year old male who is 9 years status post his initial presentation with an acute coronary syndrome in March 2006. He is status post CABG revascularization surgery in 2006 and has undergone intervention to the ostium and proximal RCA graft as well as the native diagonal vessel since  the graft to this vessel was occluded. Unfortunately he continues to smoke cigarettes. He does have significant airflow limitation documented on pulmonary function studies. His last [place] study was low risk and demonstrates moderate size distal antero-apical infarction with very minimal peri-infarction ischemia.  His blood pressure today is controlled on his multiple medication regimen as noted in my history of present illness.  He's not having any clear-cut anginal symptoms and is tolerating Ranexa 1000 mg twice a day in addition to his isosorbide 60 mg, amlodipine 10 mg and beta  blocker therapy.  Unfortunately he continues to smoke, which undoubtedly is contributing to his worsening pulmonary function.  I was straightforward with him in essentially told him that if he continues to smoke.  He ultimately will smother  with his worsening COPD/emphysema.  His chest wall discomfort is musculoskeletal.  He is not having ectopy.  He continues to be on lipid lowering therapy with atorvastatin 40 mg.  He takes BuSpar for anxiety.  He will continue with Spiriva.  He has seen pulmonary in the past.  I will see him in 6 months for reevaluation or sooner if problem arise.  Troy Sine, MD, [facility]  09/05/2014 6:00 PM

## 2014-09-06 ENCOUNTER — Other Ambulatory Visit: Payer: Self-pay | Admitting: Cardiovascular Disease

## 2014-09-06 NOTE — Telephone Encounter (Signed)
Rx refill sent to patient pharmacy   

## 2014-09-22 ENCOUNTER — Encounter (HOSPITAL_COMMUNITY): Payer: Self-pay | Admitting: Cardiology

## 2014-10-23 ENCOUNTER — Other Ambulatory Visit: Payer: Self-pay | Admitting: Cardiovascular Disease

## 2014-10-25 NOTE — Telephone Encounter (Signed)
Rx(s) sent to pharmacy electronically.  

## 2015-01-09 ENCOUNTER — Other Ambulatory Visit: Payer: Self-pay | Admitting: Cardiovascular Disease

## 2015-01-09 NOTE — Telephone Encounter (Signed)
Rx has been sent to the pharmacy electronically. ° °

## 2015-01-25 ENCOUNTER — Other Ambulatory Visit: Payer: Self-pay | Admitting: *Deleted

## 2015-01-25 MED ORDER — FAMOTIDINE 20 MG PO TABS: 20.0000 mg | ORAL_TABLET | Freq: Every day | ORAL | Status: DC

## 2015-01-25 MED ORDER — LOSARTAN POTASSIUM-HCTZ 100-12.5 MG PO TABS: 1.0000 | ORAL_TABLET | Freq: Every day | ORAL | Status: DC

## 2015-01-25 MED ORDER — CLOPIDOGREL BISULFATE 75 MG PO TABS: 75.0000 mg | ORAL_TABLET | Freq: Every day | ORAL | Status: DC

## 2015-02-26 ENCOUNTER — Other Ambulatory Visit: Payer: Self-pay | Admitting: Cardiovascular Disease

## 2015-02-27 ENCOUNTER — Telehealth: Payer: Self-pay | Admitting: *Deleted

## 2015-02-27 NOTE — Telephone Encounter (Signed)
Faxed surgical clearance to [facility].

## 2015-03-29 ENCOUNTER — Ambulatory Visit (INDEPENDENT_AMBULATORY_CARE_PROVIDER_SITE_OTHER): Payer: Medicare Other | Admitting: Cardiovascular Disease

## 2015-03-29 VITALS — BP 126/62 | HR 59 | Ht 68.0 in | Wt 215.0 lb

## 2015-03-29 DIAGNOSIS — R531 Weakness: Secondary | ICD-10-CM | POA: Diagnosis not present

## 2015-03-29 DIAGNOSIS — Z720 Tobacco use: Secondary | ICD-10-CM

## 2015-03-29 DIAGNOSIS — J438 Other emphysema: Secondary | ICD-10-CM

## 2015-03-29 DIAGNOSIS — E785 Hyperlipidemia, unspecified: Secondary | ICD-10-CM

## 2015-03-29 DIAGNOSIS — Z951 Presence of aortocoronary bypass graft: Secondary | ICD-10-CM

## 2015-03-29 DIAGNOSIS — F17200 Nicotine dependence, unspecified, uncomplicated: Secondary | ICD-10-CM

## 2015-03-29 DIAGNOSIS — Z79899 Other long term (current) drug therapy: Secondary | ICD-10-CM

## 2015-03-29 DIAGNOSIS — E669 Obesity, unspecified: Secondary | ICD-10-CM

## 2015-03-29 DIAGNOSIS — I209 Angina pectoris, unspecified: Secondary | ICD-10-CM

## 2015-03-29 DIAGNOSIS — I10 Essential (primary) hypertension: Secondary | ICD-10-CM

## 2015-03-29 DIAGNOSIS — I255 Ischemic cardiomyopathy: Secondary | ICD-10-CM | POA: Diagnosis not present

## 2015-03-29 NOTE — Patient Instructions (Signed)
Your physician recommends that you schedule a follow-up appointment in: [place]  We have ordered labs for you to get done

## 2015-03-31 ENCOUNTER — Encounter: Payer: Self-pay | Admitting: Cardiovascular Disease

## 2015-03-31 NOTE — Progress Notes (Signed)
Patient ID: Troy [name], male   DOB: [birth year], 63 y.o.   MRN: [id number]     HPI: Troy [name] is a 64 y.o. male presents to the office for a 7 month cardiology follow up evaluation.  Troy [name] suffered an acute coronary syndrome in March 2006 and underwent CABG surgery with LIMA to the LAD, vein to the diagonal, vein to the obtuse marginal, and vein to the PDA. In November 2010 he underwent PCI to the ostium of the proximal  graft to the RCA as well as native diagonal since the graft that supplied the diagonal was found to be occluded. His last catheterization in January 2013 showed a patent stent in the vein graft to the RCA, the  LIMA was small and nearly atretic and did not reach the LAD. Vein graft to diagonal was occluded. The vein graft to the marginal vessel was widely patent. Unfortunately, he continue to have significant tobacco use. He has a history of hyperlipidemia, hypertension, and uses supplemental oxygen at night. At times he does note some episodes of chest pain which have improved with initiation of Ranexa therapy. He still notes some mild episodes of chest discomfort intermittently he does not shortness of breath.  When I saw him last year I recommended at that time that he titrate his isosorbide mononitrate from 30 mg to 60 mg and gave him a new prescription. I scheduled him for a nuclear perfusion study. This was done in 06/15/2013 and was interpreted as a low risk study demonstrating moderate size distal antero-apical infarction with very mild preinfarction ischemia. He did have antero-apical dyskinesis and septal wall motion consistent with his prior sternotomy. He has felt improved.  Last year, he quit smoking for very short duration but unfortunately resumed his long-standing habit.  He is smoking one pack per day.  He admits to shortness of breath with activity.  He denies any episodes of chest tightness.  His medical regimen consists of amlodipine 10 mg, isosorbide 60 mg  losartan HCT 100/12.5 Toprol-XL 100 mg Ranexa 1000 g twice a day for his hypertension as well as CAD.  He continues on dual antiplatelet therapy without bleeding issues.  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  He has noticed some left-sided vague chest cramps which are nonexertional.  He denies palpitations.  He does note burning in the feet.  Past Medical History  Diagnosis Date  . COPD (chronic obstructive pulmonary disease)   . Anxiety   . Dyslipidemia   . Coronary artery disease 2006    Acute coronary syndrome in March 2006.   [name] Hypertension 10/09/2010    EF 40-45%    Past Surgical History  Procedure Laterality Date  . Cardiac surgery    . Cardiac catheterization    . Coronary stent placement  2010  . Arterial evalation       no evidence of ICA stenosis  . Coronary artery bypass graft  2006    Post CABG surgery with a LIMA to the LAD, a vein to the diagonal ,,a vein to the the obtuse marginal and vein to the PDA.  [name] Left heart catheterization with coronary angiogram N/A 10/21/2011    Procedure: LEFT HEART CATHETERIZATION WITH CORONARY ANGIOGRAM;  Surgeon: Leonie Man, MD;  Location: [facility] CATH LAB;  Service: Cardiovascular;  Laterality: N/A;  . Graft(s) angiogram  10/21/2011    Procedure: GRAFT(S) Cyril Loosen;  Surgeon: Leonie Man, MD;  Location: [facility] CATH LAB;  Service: Cardiovascular;;    Allergies  Allergen Reactions  . Codeine Nausea And Vomiting    Pt states when he takes codeine, he throws up blood  . Niacin And Related     "made his body feel hot & he had a hard time breathing"    Current Outpatient Prescriptions  Medication Sig Dispense Refill  . ADVAIR DISKUS 500-50 MCG/DOSE AEPB Inhale 1 puff into the lungs 2 (two) times daily as needed.  2  . ALPRAZolam (XANAX) 0.25 MG tablet Take 0.25 mg by mouth at bedtime as needed for sleep.    [name] amLODipine (NORVASC) 10 MG tablet Take 10 mg by mouth daily.    [name] atorvastatin (LIPITOR) 40 MG  tablet TAKE 1 TABLET DAILY AT BEDTIME. 30 tablet 10  . busPIRone (BUSPAR) 7.5 MG tablet Take 1 tablet by mouth 2 (two) times daily.    . clopidogrel (PLAVIX) 75 MG tablet Take 1 tablet (75 mg total) by mouth daily. 30 tablet 2  . famotidine (PEPCID) 20 MG tablet Take 1 tablet (20 mg total) by mouth daily. 30 tablet 3  . Fluticasone-Salmeterol (ADVAIR) 100-50 MCG/DOSE AEPB Inhale 1 puff into the lungs every 12 (twelve) hours.      . Ipratropium-Albuterol (COMBIVENT) 20-100 MCG/ACT AERS respimat Inhale 1 puff into the lungs every 6 (six) hours.    . isosorbide mononitrate (IMDUR) 60 MG 24 hr tablet TAKE 1 TABLET BY MOUTH EVERY DAY 90 tablet 1  . losartan-hydrochlorothiazide (HYZAAR) 100-12.5 MG per tablet Take 1 tablet by mouth daily. 30 tablet 3  . metoprolol (TOPROL-XL) 200 MG 24 hr tablet TAKE 1/2 TABLET TWICE DAILY. 30 tablet 11  . RANEXA 1000 MG SR tablet TAKE 1 TABLET EVERY 12 HOURS. 60 tablet 4  . tiotropium (SPIRIVA) 18 MCG inhalation capsule Place 18 mcg into inhaler and inhale daily.      . nitroGLYCERIN (NITROSTAT) 0.4 MG SL tablet Place 1 tablet (0.4 mg total) under the tongue every 5 (five) minutes as needed for chest pain. 25 tablet 2   No current facility-administered medications for this visit.    History   Social History  . Marital Status: Married    Spouse Name: N/A  . Number of Children: N/A  . Years of Education: N/A   Occupational History  . Not on file.   Social History Main Topics  . Smoking status: Current Every Day Smoker -- 2.00 packs/day for 50 years    Types: Cigarettes  . Smokeless tobacco: Former Systems developer    Types: [place] date: 01/23/2013  . Alcohol Use: No  . Drug Use: Not on file  . Sexual Activity: Not on file   Other Topics Concern  . Not on file   Social History Narrative    Family History  Problem Relation Age of Onset  . Heart attack Father 44  . Hypertension Mother    Socially he continues to smoke.  ROS General: Negative; No  fevers, chills, or night sweats;  HEENT: Negative; No changes in vision or hearing, sinus congestion, difficulty swallowing Pulmonary: Positive for wheezing, shortness of breath,  Cardiovascular: see HPI GI: Negative; No nausea, vomiting, diarrhea, or abdominal pain GU: Negative; No dysuria, hematuria, or difficulty voiding Musculoskeletal: Negative; no myalgias, joint pain, or weakness Hematologic/Oncology: Negative; no easy bruising, bleeding Endocrine: Negative; no heat/cold intolerance; no diabetes Neuro: Positive for neuropathy Skin: Negative; No rashes or skin lesions Psychiatric: Negative; No behavioral problems, depression Sleep: Negative; No snoring, daytime sleepiness, hypersomnolence,  bruxism, restless legs, hypnogognic hallucinations, no cataplexy Other comprehensive 14 point system review is negative.  PE Blood pressure 126/62 pulse 59, height 5' 8 "weight 215.  By this index 33.11 kg/m, consistent with mild obesity.  Wt Readings from Last 3 Encounters:  03/29/15 215 lb (97.523 kg)  09/05/14 217 lb 11.2 oz (98.748 kg)  01/03/14 214 lb 9.6 oz (97.342 kg)   General: Alert, oriented, no distress.  Skin: normal turgor, no rashes HEENT: Normocephalic, atraumatic. Pupils round and reactive; sclera anicteric;no lid lag.  Nose without nasal septal hypertrophy Mouth/Parynx benign; Mallinpatti scale 3 Neck: No JVD, no carotid bruits;  Chest wall: nontender to palpation Lungs: Diffusely decreased breath sounds; faint intermittent end expiratory wheezing Heart: RRR, s1 s2 KGYJEH;6/ 6 systolic murmur; no diastolic murmur. No S3 gallop. Abdomen: All central adiposity soft, nontender; no hepatosplenomehaly, BS+; abdominal aorta nontender and not dilated by palpation. Back: No CVA tenderness Pulses 2+ Extremities: no clubbing cyanosis or edema, Homan's sign negative  Neurologic: grossly nonfocal Psychological: Normal affect and mood.  ECG (independently read by me): Sinus  bradycardia 59 bpm. Anterolateral ST changes  November 2015 ECG (independently read by me): Sinus rhythm at 69 bpm.  Nonspecific ST changes.  No ectopy.  QTc interval 458 ms  ECG (independently read by me): Sinus rhythm at 59 beats per minute. Nondiagnostic anterolateral T changes  LABS:  BMP Latest Ref Rng 07/13/2014 06/25/2013 10/21/2011  Glucose 65-99 mg/dL 102(H) 119(H) 125(H)  BUN 7-18 mg/dL _0 Creatinine 0.60-1.30 mg/dL 1.49(H) 1.17 0.97  Sodium 136-145 mmol/L 139 140 138  Potassium 3.5-5.1 mmol/L 4.1 4.1 4.0  Chloride 98-107 mmol/L 107 105 101  CO2 21-32 mmol/L _1 Calcium 8.5-10.1 mg/dL 8.7 9.1 8.9   Hepatic Function Latest Ref Rng 07/13/2014 06/25/2013 10/18/2011  Total Protein 6.4-8.2 g/dL 7.3 7.1 7.1  Albumin 3.4-5.0 g/dL 3.5 3.9 3.6  AST 15-37 Unit/L 14(L) 18 20  ALT - _2 Alk Phosphatase - 78 63 67  Total Bilirubin 0.3 - 1.2 mg/dL - 0.8 0.4   CBC Latest Ref Rng 07/13/2014 06/25/2013 10/22/2011  WBC 3.8-10.6 x10 3/mm 3 13.1(H) 10.4 11.7(H)  Hemoglobin 13.0-18.0 g/dL 13.9 15.3 13.9  Hematocrit 40.0-52.0 % 41.8 44.4 41.8  Platelets 150-440 x10 3/mm 3 248 267 252   Lab Results  Component Value Date   MCV 93 07/13/2014   MCV 90.8 06/25/2013   MCV 91.7 10/22/2011   Lab Results  Component Value Date   TSH 5.332* 06/25/2013   Lipid Panel     Component Value Date/Time   CHOL 185 06/25/2013 0816   TRIG 270* 06/25/2013 0816   HDL 31* 06/25/2013 0816   CHOLHDL 6.0 06/25/2013 0816   VLDL 54* 06/25/2013 0816   LDLCALC 100* 06/25/2013 0816    ASSESSMENT AND PLAN: Troy [name] is a 64 year old male who is 10 years status post his initial presentation with an acute coronary syndrome in March 2006. He is status post CABG revascularization surgery in 2006 and has undergone intervention to the ostium and proximal RCA graft as well as the native diagonal vessel since  the graft to this vessel was occluded. Unfortunately he continues to smoke cigarettes. He does have  significant airflow limitation documented on pulmonary function studies. His last [place] study was low risk and demonstrates moderate size distal antero-apical infarction with very minimal peri-infarction ischemia.  He spent exertional shortness of breath most likely related to his COPD.  He is not having  any anginal symptoms and his blood pressure today remains controlled on amlodipine 10 mg, isosorbide 60 mg, losartan HCT 100/12.5, Toprol-XL, which he is taking 50 twice a day and Ranexa 1000 g twice a day.  He has GERD for which he takes Pepcid.  He is on Advair and Combivent for COPD in addition to Advair.  He is on atorvastatin 40 mg for hyperlipidemia.  I am recommending a complete set of laboratory be obtained in the fasting state.  Target LDL is less than 70 and if his LDL still elevated.  Further titration will be made.  Again discussed importance of tobacco cessation.  He does admit to occasional paresthesias which may be related to peripheral neuropathy.  Adjustments were made to his medical regimen depending on blood work results.  I will see him in 6 months for reevaluation.  Time spent: 25 minutes Troy Sine, MD, [facility]  03/31/2015 2:48 PM

## 2015-04-22 ENCOUNTER — Other Ambulatory Visit: Payer: Self-pay | Admitting: Cardiovascular Disease

## 2015-05-06 ENCOUNTER — Other Ambulatory Visit: Payer: Self-pay | Admitting: Cardiovascular Disease

## 2015-06-03 ENCOUNTER — Other Ambulatory Visit: Payer: Self-pay | Admitting: Cardiovascular Disease

## 2015-06-05 NOTE — Telephone Encounter (Signed)
Rx(s) sent to pharmacy electronically.  

## 2015-07-28 ENCOUNTER — Other Ambulatory Visit: Payer: Self-pay | Admitting: Cardiovascular Disease

## 2015-07-28 NOTE — Telephone Encounter (Signed)
Rx(s) sent to pharmacy electronically.  

## 2015-08-07 ENCOUNTER — Other Ambulatory Visit: Payer: Self-pay | Admitting: Cardiovascular Disease

## 2015-08-25 ENCOUNTER — Other Ambulatory Visit: Payer: Self-pay | Admitting: Cardiovascular Disease

## 2015-09-24 ENCOUNTER — Other Ambulatory Visit: Payer: Self-pay | Admitting: Cardiovascular Disease

## 2015-09-25 NOTE — Telephone Encounter (Signed)
Rx request sent to pharmacy.  

## 2015-10-04 ENCOUNTER — Encounter: Payer: Self-pay | Admitting: Cardiovascular Disease

## 2015-10-04 ENCOUNTER — Ambulatory Visit (INDEPENDENT_AMBULATORY_CARE_PROVIDER_SITE_OTHER): Payer: Medicare Other | Admitting: Cardiovascular Disease

## 2015-10-04 VITALS — BP 120/70 | HR 69 | Ht 68.0 in | Wt 215.0 lb

## 2015-10-04 DIAGNOSIS — Z720 Tobacco use: Secondary | ICD-10-CM

## 2015-10-04 DIAGNOSIS — I25810 Atherosclerosis of coronary artery bypass graft(s) without angina pectoris: Secondary | ICD-10-CM

## 2015-10-04 DIAGNOSIS — I255 Ischemic cardiomyopathy: Secondary | ICD-10-CM

## 2015-10-04 DIAGNOSIS — F17200 Nicotine dependence, unspecified, uncomplicated: Secondary | ICD-10-CM

## 2015-10-04 DIAGNOSIS — Z79899 Other long term (current) drug therapy: Secondary | ICD-10-CM

## 2015-10-04 DIAGNOSIS — J439 Emphysema, unspecified: Secondary | ICD-10-CM

## 2015-10-04 DIAGNOSIS — E669 Obesity, unspecified: Secondary | ICD-10-CM

## 2015-10-04 DIAGNOSIS — I10 Essential (primary) hypertension: Secondary | ICD-10-CM

## 2015-10-04 DIAGNOSIS — E785 Hyperlipidemia, unspecified: Secondary | ICD-10-CM | POA: Diagnosis not present

## 2015-10-04 DIAGNOSIS — Z951 Presence of aortocoronary bypass graft: Secondary | ICD-10-CM

## 2015-10-04 DIAGNOSIS — Z23 Encounter for immunization: Secondary | ICD-10-CM | POA: Diagnosis not present

## 2015-10-04 NOTE — Patient Instructions (Signed)
Your physician recommends that you return for lab work fasting.   Your physician wants you to follow-up in: 6 months or sooner if needed. You will receive a reminder letter in the mail two months in advance. If you don't receive a letter, please call our office to schedule the follow-up appointment.  If you need a refill on your cardiac medications before your next appointment, please call your pharmacy.   

## 2015-10-04 NOTE — Progress Notes (Signed)
Patient ID: Troy [name], male   DOB: 1951-01-02, 64 y.o.   MRN: [id number]     HPI: Troy [name] is a 64 y.o. male presents to the office for a 6 month cardiology follow up evaluation.  Troy [name] suffered an acute coronary syndrome in March 2006 and underwent CABG surgery with LIMA to the LAD, vein to the diagonal, vein to the obtuse marginal, and vein to the PDA. In November 2010 he underwent PCI to the ostium of the proximal  graft to the RCA as well as native diagonal since the graft that supplied the diagonal was found to be occluded. His last catheterization in January 2013 showed a patent stent in the vein graft to the RCA, the  LIMA was small and nearly atretic and did not reach the LAD. Vein graft to diagonal was occluded. The vein graft to the marginal vessel was widely patent. Unfortunately, he continue to have significant tobacco use. He has a history of hyperlipidemia, hypertension, and uses supplemental oxygen at night. At times he does note some episodes of chest pain which have improved with initiation of Ranexa therapy. He still notes some mild episodes of chest discomfort intermittently he does not shortness of breath.  A nuclear perfusion study on 06/15/2013 was interpreted as a low risk study demonstrating moderate size distal antero-apical infarction with very mild preinfarction ischemia. He did have antero-apical dyskinesis and septal wall motion consistent with his prior sternotomy. He has felt improved.  Last year, he quit smoking for very short duration but unfortunately resumed his long-standing habit.  He is smoking one pack per day.  He admits to shortness of breath with activity.  He denies any episodes of chest tightness.  His medical regimen consists of amlodipine 10 mg, isosorbide 60 mg losartan HCT 100/12.5 Toprol-XL 100 mg Ranexa 1000 g twice a day for his hypertension as well as CAD.  He continues on dual antiplatelet therapy without bleeding issues.  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  Since I last saw him, he again quit tobacco use for several months after it had a long discussion with him 6 months ago.  Unfortunately, 2 months ago.  He again resumed smoking and is still smoking a pack per day.  He has noticed some left-sided vague chest cramps which are nonexertional.  He denies palpitations.  He does note burning in the feet.  He presents for evaluation.  Past Medical History  Diagnosis Date  . COPD (chronic obstructive pulmonary disease) ([place])   . Anxiety   . Dyslipidemia   . Coronary artery disease 2006    Acute coronary syndrome in March 2006.   [name] Hypertension 10/09/2010    EF 40-45%    Past Surgical History  Procedure Laterality Date  . Cardiac surgery    . Cardiac catheterization    . Coronary stent placement  2010  . Arterial evalation       no evidence of ICA stenosis  . Coronary artery bypass graft  2006    Post CABG surgery with a LIMA to the LAD, a vein to the diagonal ,,a vein to the the obtuse marginal and vein to the PDA.  [name] Left heart catheterization with coronary angiogram N/A 10/21/2011    Procedure: LEFT HEART CATHETERIZATION WITH CORONARY ANGIOGRAM;  Surgeon: Leonie Man, MD;  Location: [facility] CATH LAB;  Service: Cardiovascular;  Laterality: N/A;  . Graft(s) angiogram  10/21/2011    Procedure: GRAFT(S) ANGIOGRAM;  Surgeon: Leonie Man, MD;  Location: [facility] CATH LAB;  Service: Cardiovascular;;    Allergies  Allergen Reactions  . Codeine Nausea And Vomiting    Pt states when he takes codeine, he throws up blood  . Niacin And Related     "made his body feel hot & he had a hard time breathing"    Current Outpatient Prescriptions  Medication Sig Dispense Refill  . ADVAIR DISKUS 500-50 MCG/DOSE AEPB Inhale 1 puff into the lungs 2 (two) times daily as needed.  2  . ALPRAZolam (XANAX) 0.25 MG tablet Take 0.25 mg by mouth at bedtime as needed for sleep.    [name] amLODipine (NORVASC) 10 MG tablet  Take 10 mg by mouth daily.    [name] atorvastatin (LIPITOR) 40 MG tablet TAKE 1 TABLET DAILY AT BEDTIME. 30 tablet 0  . busPIRone (BUSPAR) 7.5 MG tablet Take 1 tablet by mouth 2 (two) times daily.    . clopidogrel (PLAVIX) 75 MG tablet TAKE 1 TABLET (75 MG TOTAL) BY MOUTH DAILY. 30 tablet 4  . clopidogrel (PLAVIX) 75 MG tablet TAKE 1 TABLET (75 MG TOTAL) BY MOUTH DAILY. 30 tablet 5  . famotidine (PEPCID) 20 MG tablet TAKE 1 TABLET (20 MG TOTAL) BY MOUTH DAILY. 30 tablet 5  . Fluticasone-Salmeterol (ADVAIR) 100-50 MCG/DOSE AEPB Inhale 1 puff into the lungs every 12 (twelve) hours.      . Ipratropium-Albuterol (COMBIVENT) 20-100 MCG/ACT AERS respimat Inhale 1 puff into the lungs every 6 (six) hours.    . isosorbide mononitrate (IMDUR) 60 MG 24 hr tablet TAKE 1 TABLET BY MOUTH EVERY DAY 90 tablet 1  . losartan-hydrochlorothiazide (HYZAAR) 100-12.5 MG per tablet TAKE 1 TABLET BY MOUTH DAILY. 30 tablet 6  . metoprolol (TOPROL-XL) 200 MG 24 hr tablet TAKE 1/2 TABLET BY MOUTH TWICE DAILY. 30 tablet 5  . ranolazine (RANEXA) 1000 MG SR tablet Take 1 tablet (1,000 mg total) by mouth 2 (two) times daily. 60 tablet 8  . tiotropium (SPIRIVA) 18 MCG inhalation capsule Place 18 mcg into inhaler and inhale daily.      . nitroGLYCERIN (NITROSTAT) 0.4 MG SL tablet Place 1 tablet (0.4 mg total) under the tongue every 5 (five) minutes as needed for chest pain. 25 tablet 2   No current facility-administered medications for this visit.    Social History   Social History  . Marital Status: Married    Spouse Name: N/A  . Number of Children: N/A  . Years of Education: N/A   Occupational History  . Not on file.   Social History Main Topics  . Smoking status: Current Every Day Smoker -- 2.00 packs/day for 50 years    Types: Cigarettes  . Smokeless tobacco: Former Systems developer    Types: [place] date: 01/23/2013  . Alcohol Use: No  . Drug Use: Not on file  . Sexual Activity: Not on file   Other Topics Concern  .  Not on file   Social History Narrative    Family History  Problem Relation Age of Onset  . Heart attack Father 41  . Hypertension Mother    Socially he continues to smoke.  He has been under increased stress.  His wife recently left him.  ROS General: Negative; No fevers, chills, or night sweats;  HEENT: Negative; No changes in vision or hearing, sinus congestion, difficulty swallowing Pulmonary: Positive for wheezing, shortness of breath,  Cardiovascular: see HPI GI: Negative; No nausea, vomiting, diarrhea, or abdominal pain  GU: Negative; No dysuria, hematuria, or difficulty voiding Musculoskeletal: Negative; no myalgias, joint pain, or weakness Hematologic/Oncology: Negative; no easy bruising, bleeding Endocrine: Negative; no heat/cold intolerance; no diabetes Neuro: Positive for neuropathy Skin: Negative; No rashes or skin lesions Psychiatric: Negative; No behavioral problems, depression Sleep: Negative; No snoring, daytime sleepiness, hypersomnolence, bruxism, restless legs, hypnogognic hallucinations, no cataplexy Other comprehensive 14 point system review is negative.  PE BP 120/70 mmHg  Pulse 69  Ht '5\' 8"'$  (1.727 m)  Wt 215 lb (97.523 kg)  BMI 32.70 kg/m2.  Wt Readings from Last 3 Encounters:  10/04/15 215 lb (97.523 kg)  03/29/15 215 lb (97.523 kg)  09/05/14 217 lb 11.2 oz (98.748 kg)   General: Alert, oriented, no distress.  Appears older than stated age. Skin: normal turgor, no rashes HEENT: Normocephalic, atraumatic. Pupils round and reactive; sclera anicteric;no lid lag.  Nose without nasal septal hypertrophy Mouth/Parynx benign; Mallinpatti scale 3 Neck: No JVD, no carotid bruits;  Chest wall: nontender to palpation Lungs: Diffusely decreased breath sounds; faint intermittent end expiratory wheezing Heart: RRR, s1 s2 HGDJME;2/ 6 systolic murmur; no diastolic murmur. No S3 gallop. Abdomen: All central adiposity soft, nontender; no hepatosplenomehaly, BS+;  abdominal aorta nontender and not dilated by palpation. Back: No CVA tenderness Pulses 2+ Extremities: no clubbing cyanosis or edema, Homan's sign negative  Neurologic: grossly nonfocal Psychological: Normal affect and mood.  ECG (independently read by me): Normal sinus rhythm at 69 bpm.  Nonspecific T changes.  QTc interval 469 ms.  June 2016 ECG (independently read by me): Sinus bradycardia 59 bpm. Anterolateral ST changes  November 2015 ECG (independently read by me): Sinus rhythm at 69 bpm.  Nonspecific ST changes.  No ectopy.  QTc interval 458 ms  ECG (independently read by me): Sinus rhythm at 59 beats per minute. Nondiagnostic anterolateral T changes  LABS:  BMP Latest Ref Rng 07/13/2014 06/25/2013 10/21/2011  Glucose 65-99 mg/dL 102(H) 119(H) 125(H)  BUN 7-18 mg/dL '16 12 11  '$ Creatinine 0.60-1.30 mg/dL 1.49(H) 1.17 0.97  Sodium 136-145 mmol/L 139 140 138  Potassium 3.5-5.1 mmol/L 4.1 4.1 4.0  Chloride 98-107 mmol/L 107 105 101  CO2 21-32 mmol/L '26 30 29  '$ Calcium 8.5-10.1 mg/dL 8.7 9.1 8.9   Hepatic Function Latest Ref Rng 07/13/2014 06/25/2013 10/18/2011  Total Protein 6.4-8.2 g/dL 7.3 7.1 7.1  Albumin 3.4-5.0 g/dL 3.5 3.9 3.6  AST 15-37 Unit/L 14(L) 18 20  ALT - '23 22 19  '$ Alk Phosphatase - 78 63 67  Total Bilirubin 0.2-1.0 mg/dL 0.8 0.8 0.4   CBC Latest Ref Rng 07/13/2014 06/25/2013 10/22/2011  WBC 3.8-10.6 x10 3/mm 3 13.1(H) 10.4 11.7(H)  Hemoglobin 13.0-18.0 g/dL 13.9 15.3 13.9  Hematocrit 40.0-52.0 % 41.8 44.4 41.8  Platelets 150-440 x10 3/mm 3 248 267 252   Lab Results  Component Value Date   MCV 93 07/13/2014   MCV 90.8 06/25/2013   MCV 91.7 10/22/2011   Lab Results  Component Value Date   TSH 5.332* 06/25/2013   Lipid Panel     Component Value Date/Time   CHOL 185 06/25/2013 0816   TRIG 270* 06/25/2013 0816   HDL 31* 06/25/2013 0816   CHOLHDL 6.0 06/25/2013 0816   VLDL 54* 06/25/2013 0816   LDLCALC 100* 06/25/2013 0816    ASSESSMENT AND PLAN: Mr.  Gandolfo is a 64 year old male who is 10 1/2 years status post his initial presentation with an acute coronary syndrome in March 2006. He is status post CABG revascularization surgery in 2006 and has  undergone intervention to the ostium and proximal RCA graft as well as the native diagonal vessel since  the graft to this vessel was occluded. Unfortunately he again try to quit smoking but resumed use of cigarettes. He does have significant airflow limitation documented on pulmonary function studies. His last [place] study was low risk and demonstrates moderate size distal antero-apical infarction with very minimal peri-infarction ischemia.  He spent exertional shortness of breath most likely related to his COPD.  He is not having any anginal symptoms and his blood pressure today remains controlled on amlodipine 10 mg, isosorbide 60 mg, losartan HCT 100/12.5, Toprol-XL, which he is taking 50 twice a day and Ranexa 1000 g twice a day.  He has GERD for which he takes Pepcid.  He is on Advair and Combivent for COPD in addition to Advair.  He is on atorvastatin 40 mg for hyperlipidemia.  When I last saw him I recommended a complete set of blood work be obtained.  He never had his labs drawn.  We will now reschedule him for this comprehensive blood work.  Adjustments to his medications will be made if necessary. I again discussed importance of complete smoking cessation.  I will see him in 6 months for reevaluation  Time spent: 25 minutes Troy Sine, MD, [facility]  10/04/2015 6:06 PM

## 2015-10-24 ENCOUNTER — Other Ambulatory Visit: Payer: Self-pay | Admitting: Cardiovascular Disease

## 2015-10-25 NOTE — Telephone Encounter (Signed)
Rx request sent to pharmacy.  

## 2015-12-03 ENCOUNTER — Other Ambulatory Visit: Payer: Self-pay | Admitting: Cardiovascular Disease

## 2015-12-04 NOTE — Telephone Encounter (Signed)
Rx(s) sent to pharmacy electronically.  

## 2015-12-20 ENCOUNTER — Other Ambulatory Visit: Payer: Self-pay | Admitting: Cardiovascular Disease

## 2015-12-20 NOTE — Telephone Encounter (Signed)
REFILL 

## 2016-02-07 ENCOUNTER — Other Ambulatory Visit: Payer: Self-pay

## 2016-02-07 MED ORDER — CLOPIDOGREL BISULFATE 75 MG PO TABS: 75.0000 mg | ORAL_TABLET | Freq: Every day | ORAL | Status: DC

## 2016-02-07 NOTE — Telephone Encounter (Signed)
Rx(s) sent to pharmacy electronically.  

## 2016-02-19 ENCOUNTER — Other Ambulatory Visit: Payer: Self-pay | Admitting: Cardiovascular Disease

## 2016-02-19 NOTE — Telephone Encounter (Signed)
Rx Refill

## 2016-04-01 ENCOUNTER — Other Ambulatory Visit: Payer: Self-pay | Admitting: Cardiovascular Disease

## 2016-04-01 NOTE — Telephone Encounter (Signed)
Rx(s) sent to pharmacy electronically.  

## 2016-04-24 ENCOUNTER — Other Ambulatory Visit: Payer: Self-pay | Admitting: Cardiovascular Disease

## 2016-05-10 ENCOUNTER — Other Ambulatory Visit: Payer: Self-pay | Admitting: Cardiovascular Disease

## 2016-05-13 NOTE — Telephone Encounter (Signed)
Rx request sent to pharmacy.  

## 2016-05-19 ENCOUNTER — Other Ambulatory Visit: Payer: Self-pay | Admitting: Cardiovascular Disease

## 2016-05-20 NOTE — Telephone Encounter (Signed)
Rx(s) sent to pharmacy electronically.  

## 2016-06-26 ENCOUNTER — Other Ambulatory Visit: Payer: Self-pay | Admitting: Cardiovascular Disease

## 2016-07-11 LAB — CBC
HCT: 43.2 % (ref 38.5–50.0)
Hemoglobin: 14.4 g/dL (ref 13.2–17.1)
MCH: 30.2 pg (ref 27.0–33.0)
MCHC: 33.3 g/dL (ref 32.0–36.0)
MCV: 90.6 fL (ref 80.0–100.0)
MPV: 10.2 fL (ref 7.5–12.5)
PLATELETS: 247 10*3/uL (ref 140–400)
RBC: 4.77 MIL/uL (ref 4.20–5.80)
RDW: 14.7 % (ref 11.0–15.0)
WBC: 10.1 10*3/uL (ref 3.8–10.8)

## 2016-07-11 LAB — LIPID PANEL
CHOL/HDL RATIO: 3.9 ratio (ref <=5.0)
Cholesterol: 141 mg/dL (ref 125–200)
HDL: 36 mg/dL — ABNORMAL LOW (ref >=40)
LDL Cholesterol: 86 mg/dL (ref <130)
Triglycerides: 93 mg/dL (ref <150)
VLDL: 19 mg/dL (ref <30)

## 2016-07-11 LAB — COMPREHENSIVE METABOLIC PANEL
ALK PHOS: 88 U/L (ref 40–115)
ALT: 13 U/L (ref 9–46)
AST: 15 U/L (ref 10–35)
Albumin: 3.9 g/dL (ref 3.6–5.1)
BILIRUBIN TOTAL: 0.7 mg/dL (ref 0.2–1.2)
BUN: 11 mg/dL (ref 7–25)
CO2: 30 mmol/L (ref 20–31)
CREATININE: 1.25 mg/dL (ref 0.70–1.25)
Calcium: 9.3 mg/dL (ref 8.6–10.3)
Chloride: 98 mmol/L (ref 98–110)
GLUCOSE: 106 mg/dL — AB (ref 65–99)
Potassium: 4.4 mmol/L (ref 3.5–5.3)
SODIUM: 138 mmol/L (ref 135–146)
Total Protein: 7.1 g/dL (ref 6.1–8.1)

## 2016-07-11 LAB — TSH: TSH: 3.25 m[IU]/L (ref 0.40–4.50)

## 2016-08-07 ENCOUNTER — Telehealth: Payer: Self-pay | Admitting: *Deleted

## 2016-08-07 NOTE — Telephone Encounter (Signed)
-----   Message from Troy Sine, MD sent at 08/05/2016  8:11 AM EDT ----- Labs good

## 2016-08-07 NOTE — Telephone Encounter (Signed)
Called and notified patient of results.

## 2016-08-11 ENCOUNTER — Other Ambulatory Visit: Payer: Self-pay | Admitting: Cardiovascular Disease

## 2016-09-26 ENCOUNTER — Other Ambulatory Visit: Payer: Self-pay | Admitting: Cardiovascular Disease

## 2016-09-26 NOTE — Telephone Encounter (Signed)
Rx(s) sent to pharmacy electronically.  

## 2016-10-24 ENCOUNTER — Other Ambulatory Visit: Payer: Self-pay | Admitting: Internal Medicine

## 2016-10-24 ENCOUNTER — Ambulatory Visit
Admission: RE | Admit: 2016-10-24 | Discharge: 2016-10-24 | Disposition: A | Discharge status: Home / Self Care | Payer: Medicare Other | Source: Ambulatory Visit | Attending: Internal Medicine | Admitting: Internal Medicine

## 2016-10-24 DIAGNOSIS — R05 Cough: Secondary | ICD-10-CM | POA: Diagnosis not present

## 2016-10-24 DIAGNOSIS — R059 Cough, unspecified: Secondary | ICD-10-CM

## 2016-10-24 DIAGNOSIS — Z951 Presence of aortocoronary bypass graft: Secondary | ICD-10-CM | POA: Diagnosis not present

## 2016-11-04 ENCOUNTER — Other Ambulatory Visit: Payer: Self-pay | Admitting: Cardiovascular Disease

## 2016-11-10 ENCOUNTER — Other Ambulatory Visit: Payer: Self-pay | Admitting: Cardiovascular Disease

## 2016-11-12 ENCOUNTER — Other Ambulatory Visit: Payer: Self-pay | Admitting: Cardiovascular Disease

## 2016-11-12 NOTE — Telephone Encounter (Signed)
Rx(s) sent to pharmacy electronically.  

## 2016-12-02 ENCOUNTER — Other Ambulatory Visit: Payer: Self-pay | Admitting: Cardiovascular Disease

## 2016-12-08 ENCOUNTER — Other Ambulatory Visit: Payer: Self-pay | Admitting: Cardiovascular Disease

## 2016-12-11 ENCOUNTER — Other Ambulatory Visit: Payer: Self-pay | Admitting: Cardiovascular Disease

## 2016-12-27 ENCOUNTER — Ambulatory Visit
Admission: RE | Admit: 2016-12-27 | Discharge: 2016-12-27 | Disposition: A | Discharge status: Home / Self Care | Payer: Medicare Other | Source: Ambulatory Visit | Attending: Internal Medicine | Admitting: Internal Medicine

## 2016-12-27 ENCOUNTER — Other Ambulatory Visit: Payer: Self-pay | Admitting: Internal Medicine

## 2016-12-27 DIAGNOSIS — M8580 Other specified disorders of bone density and structure, unspecified site: Secondary | ICD-10-CM | POA: Diagnosis not present

## 2016-12-27 DIAGNOSIS — F17200 Nicotine dependence, unspecified, uncomplicated: Secondary | ICD-10-CM | POA: Diagnosis not present

## 2016-12-27 DIAGNOSIS — R0602 Shortness of breath: Secondary | ICD-10-CM | POA: Insufficient documentation

## 2016-12-27 DIAGNOSIS — I700 Atherosclerosis of aorta: Secondary | ICD-10-CM | POA: Insufficient documentation

## 2016-12-30 ENCOUNTER — Ambulatory Visit (INDEPENDENT_AMBULATORY_CARE_PROVIDER_SITE_OTHER): Payer: Medicare Other | Admitting: Cardiovascular Disease

## 2016-12-30 ENCOUNTER — Encounter: Payer: Self-pay | Admitting: Cardiovascular Disease

## 2016-12-30 VITALS — BP 102/52 | HR 66 | Ht 68.0 in | Wt 224.0 lb

## 2016-12-30 DIAGNOSIS — I10 Essential (primary) hypertension: Secondary | ICD-10-CM | POA: Diagnosis not present

## 2016-12-30 DIAGNOSIS — Z716 Tobacco abuse counseling: Secondary | ICD-10-CM

## 2016-12-30 DIAGNOSIS — I255 Ischemic cardiomyopathy: Secondary | ICD-10-CM

## 2016-12-30 DIAGNOSIS — I2589 Other forms of chronic ischemic heart disease: Secondary | ICD-10-CM

## 2016-12-30 DIAGNOSIS — Z951 Presence of aortocoronary bypass graft: Secondary | ICD-10-CM | POA: Diagnosis not present

## 2016-12-30 DIAGNOSIS — E785 Hyperlipidemia, unspecified: Secondary | ICD-10-CM

## 2016-12-30 DIAGNOSIS — E669 Obesity, unspecified: Secondary | ICD-10-CM

## 2016-12-30 DIAGNOSIS — Z79899 Other long term (current) drug therapy: Secondary | ICD-10-CM | POA: Diagnosis not present

## 2016-12-30 DIAGNOSIS — J449 Chronic obstructive pulmonary disease, unspecified: Secondary | ICD-10-CM | POA: Diagnosis not present

## 2016-12-30 NOTE — Progress Notes (Signed)
Patient ID: SABASTIEN TYLER, male   DOB: 1951/05/09, 66 y.o.   MRN: [id number]     HPI: MAXAMILLION BANAS is a 66 y.o. male presents to the office for a 15  month cardiology follow up evaluation.  Mr. Saini suffered an acute coronary syndrome in March 2006 and underwent CABG surgery with LIMA to the LAD, vein to the diagonal, vein to the obtuse marginal, and vein to the PDA. In November 2010 he underwent PCI to the ostium of the proximal  graft to the RCA as well as native diagonal since the graft that supplied the diagonal was found to be occluded. His last catheterization in January 2013 showed a patent stent in the vein graft to the RCA, the  LIMA was small and nearly atretic and did not reach the LAD. Vein graft to diagonal was occluded. The vein graft to the marginal vessel was widely patent. Unfortunately, he continue to have significant tobacco use. He has a history of hyperlipidemia, hypertension, and uses supplemental oxygen at night. At times he does note some episodes of chest pain which have improved with initiation of Ranexa therapy.  A nuclear perfusion study on 06/15/2013 was interpreted as a low risk study demonstrating moderate size distal antero-apical infarction with very mild preinfarction ischemia. He did have antero-apical dyskinesis and septal wall motion consistent with his prior sternotomy. He has felt improved.  Last year, he quit smoking for very short duration but unfortunately resumed his long-standing habit.  He is smoking one pack per day.  He admits to shortness of breath with activity.  He denies any episodes of chest tightness.  His medical regimen consists of amlodipine 10 mg, isosorbide 60 mg losartan HCT 100/12.5 Toprol-XL 100 mg Ranexa 1000 mg twice a day for his hypertension as well as CAD.  He continues on dual antiplatelet therapy without bleeding issues.  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  Since I last saw him , he has continued  to smoke cigarettes on a daily basis.  Coring 2 family member.  He is eating ice cream donuts and candy frequently.  He does not exercise.  He notes shortness of breath with activity.  He is using CPAP with 100% compliance.  He is unaware of any palpitations.  He has not had recent laboratory.  He presents for reevaluation.  Past Medical History:  Diagnosis Date  . Anxiety   . COPD (chronic obstructive pulmonary disease) ([place])   . Coronary artery disease 2006   Acute coronary syndrome in March 2006.   [name] Dyslipidemia   . Hypertension 10/09/2010   EF 40-45%    Past Surgical History:  Procedure Laterality Date  . Arterial evalation      no evidence of ICA stenosis  . CARDIAC CATHETERIZATION    . CARDIAC SURGERY    . CORONARY ARTERY BYPASS GRAFT  2006   Post CABG surgery with a LIMA to the LAD, a vein to the diagonal ,,a vein to the the obtuse marginal and vein to the PDA.  [name] CORONARY STENT PLACEMENT  2010  . GRAFT(S) ANGIOGRAM  10/21/2011   Procedure: GRAFT(S) Cyril Loosen;  Surgeon: Leonie Man, MD;  Location: [facility] CATH LAB;  Service: Cardiovascular;;  . LEFT HEART CATHETERIZATION WITH CORONARY ANGIOGRAM N/A 10/21/2011   Procedure: LEFT HEART CATHETERIZATION WITH CORONARY ANGIOGRAM;  Surgeon: Leonie Man, MD;  Location: [facility] CATH LAB;  Service: Cardiovascular;  Laterality: N/A;    Allergies  Allergen Reactions  .  Codeine Nausea And Vomiting    Pt states when he takes codeine, he throws up blood  . Niacin And Related     "made his body feel hot & he had a hard time breathing"    Current Outpatient Prescriptions  Medication Sig Dispense Refill  . ADVAIR DISKUS 500-50 MCG/DOSE AEPB Inhale 1 puff into the lungs 2 (two) times daily as needed.  2  . ALPRAZolam (XANAX) 0.25 MG tablet Take 0.25 mg by mouth at bedtime as needed for sleep.    [name] amLODipine (NORVASC) 10 MG tablet Take 10 mg by mouth daily.    [name] atorvastatin (LIPITOR) 40 MG tablet TAKE 1 TABLET DAILY AT BEDTIME. 30 tablet 6    . busPIRone (BUSPAR) 7.5 MG tablet Take 1 tablet by mouth 2 (two) times daily.    . clopidogrel (PLAVIX) 75 MG tablet Take 1 tablet (75 mg total) by mouth daily. 90 tablet 2  . famotidine (PEPCID) 20 MG tablet TAKE 1 TABLET BY MOUTH DAILY. 30 tablet 5  . Fluticasone-Salmeterol (ADVAIR) 100-50 MCG/DOSE AEPB Inhale 1 puff into the lungs every 12 (twelve) hours.      . INCRUSE ELLIPTA 62.5 MCG/INH AEPB INHALE 1 PUFF INTO THE LUNGS DAILY  2  . Ipratropium-Albuterol (COMBIVENT) 20-100 MCG/ACT AERS respimat Inhale 1 puff into the lungs every 6 (six) hours.    . isosorbide mononitrate (IMDUR) 60 MG 24 hr tablet TAKE 1 TABLET BY MOUTH EVERY DAY 90 tablet 1  . metoprolol (TOPROL-XL) 200 MG 24 hr tablet Take 0.5 tablets (100 mg total) by mouth 2 (two) times daily. 30 tablet 0  . nitroGLYCERIN (NITROSTAT) 0.4 MG SL tablet TAKE 1 TABLET AS NEEDED EVERY 5 MIN X 3 DOSES  2  . RANEXA 1000 MG SR tablet TAKE 1 TABLET (1,000 MG TOTAL) BY MOUTH 2 (TWO) TIMES DAILY. 60 tablet 8  . tiotropium (SPIRIVA) 18 MCG inhalation capsule Place 18 mcg into inhaler and inhale daily.      [name] losartan-hydrochlorothiazide (HYZAAR) 100-12.5 MG tablet Take 1 tablet by mouth daily. 30 tablet 10  . nitroGLYCERIN (NITROSTAT) 0.4 MG SL tablet Place 1 tablet (0.4 mg total) under the tongue every 5 (five) minutes as needed for chest pain. 25 tablet 2   No current facility-administered medications for this visit.     Social History   Social History  . Marital status: Married    Spouse name: N/A  . Number of children: N/A  . Years of education: N/A   Occupational History  . Not on file.   Social History Main Topics  . Smoking status: Current Every Day Smoker    Packs/day: 2.00    Years: 50.00    Types: Cigarettes  . Smokeless tobacco: Former Systems developer    Types: Chew    Quit date: 01/23/2013  . Alcohol use No  . Drug use: Unknown  . Sexual activity: Not on file   Other Topics Concern  . Not on file   Social History Narrative   . No narrative on file    Family History  Problem Relation Age of Onset  . Heart attack Father 89  . Hypertension Mother    Socially he continues to smoke.  He has been under increased stress.  His wife recently left him.  ROS General: Negative; No fevers, chills, or night sweats;  HEENT: Negative; No changes in vision or hearing, sinus congestion, difficulty swallowing Pulmonary: Positive for wheezing, shortness of breath,  Cardiovascular: see HPI GI: Negative; No  nausea, vomiting, diarrhea, or abdominal pain GU: Negative; No dysuria, hematuria, or difficulty voiding Musculoskeletal: Negative; no myalgias, joint pain, or weakness Hematologic/Oncology: Negative; no easy bruising, bleeding Endocrine: Negative; no heat/cold intolerance; no diabetes Neuro: Positive for neuropathy Skin: Negative; No rashes or skin lesions Psychiatric: Negative; No behavioral problems, depression Sleep: Negative; No snoring, daytime sleepiness, hypersomnolence, bruxism, restless legs, hypnogognic hallucinations, no cataplexy Other comprehensive 14 point system review is negative.  PE BP (!) 102/52   Pulse 66   Ht '5\' 8"'$  (1.727 m)   Wt 224 lb (101.6 kg)   BMI 34.06 kg/m .  Wt Readings from Last 3 Encounters:  12/30/16 224 lb (101.6 kg)  10/04/15 215 lb (97.5 kg)  03/29/15 215 lb (97.5 kg)   General: Alert, oriented, no distress.  Appears older than stated age. Skin: normal turgor, no rashes HEENT: Normocephalic, atraumatic. Pupils round and reactive; sclera anicteric;no lid lag.  Nose without nasal septal hypertrophy Mouth/Parynx benign; Mallinpatti scale 3 Neck: No JVD, no carotid bruits;  Chest wall: nontender to palpation Lungs: Diffusely decreased breath sounds; faint intermittent end expiratory wheezing Heart: RRR, s1 s2 [id number];7/ 6 systolic murmur; no diastolic murmur. No S3 gallop. Abdomen: All central adiposity soft, nontender; no hepatosplenomehaly, BS+; abdominal aorta nontender  and not dilated by palpation. Back: No CVA tenderness Pulses 2+ Extremities: no clubbing cyanosis or edema, Homan's sign negative  Neurologic: grossly nonfocal Psychological: Normal affect and mood.  ECG (independently read by me):Normal sinus rhythm at 67 bpm.  Nonspecific ST changes.  December 2016 ECG (independently read by me): Normal sinus rhythm at 69 bpm.  Nonspecific T changes.  QTc interval 469 ms.  June 2016 ECG (independently read by me): Sinus bradycardia 59 bpm. Anterolateral ST changes  November 2015 ECG (independently read by me): Sinus rhythm at 69 bpm.  Nonspecific ST changes.  No ectopy.  QTc interval 458 ms  ECG (independently read by me): Sinus rhythm at 59 beats per minute. Nondiagnostic anterolateral T changes  LABS:  BMP Latest Ref Rng & Units 07/11/2016 07/13/2014 06/25/2013  Glucose 65 - 99 mg/dL 106(H) 102(H) 119(H)  BUN 7 - 25 mg/dL '11 16 12  '$ Creatinine 0.70 - 1.25 mg/dL 1.25 1.49(H) 1.17  Sodium 135 - 146 mmol/L 138 139 140  Potassium 3.5 - 5.3 mmol/L 4.4 4.1 4.1  Chloride 98 - 110 mmol/L 98 107 105  CO2 20 - 31 mmol/L '30 26 30  '$ Calcium 8.6 - 10.3 mg/dL 9.3 8.7 9.1   Hepatic Function Latest Ref Rng & Units 07/11/2016 07/13/2014 06/25/2013  Total Protein 6.1 - 8.1 g/dL 7.1 7.3 7.1  Albumin 3.6 - 5.1 g/dL 3.9 3.5 3.9  AST 10 - 35 U/L 15 14(L) 18  ALT 9 - 46 U/L '13 23 22  '$ Alk Phosphatase 40 - 115 U/L 88 78 63  Total Bilirubin 0.2 - 1.2 mg/dL 0.7 0.8 0.8   CBC Latest Ref Rng & Units 07/11/2016 07/13/2014 06/25/2013  WBC 3.8 - 10.8 K/uL 10.1 13.1(H) 10.4  Hemoglobin 13.2 - 17.1 g/dL 14.4 13.9 15.3  Hematocrit 38.5 - 50.0 % 43.2 41.8 44.4  Platelets 140 - 400 K/uL 247 248 267   Lab Results  Component Value Date   MCV 90.6 07/11/2016   MCV 93 07/13/2014   MCV 90.8 06/25/2013   Lab Results  Component Value Date   TSH 3.25 07/11/2016   Lipid Panel     Component Value Date/Time   CHOL 141 07/11/2016 1026   TRIG 93 07/11/2016 1026  HDL 36 (L)  07/11/2016 1026   CHOLHDL 3.9 07/11/2016 1026   VLDL 19 07/11/2016 1026   LDLCALC 86 07/11/2016 1026     IMPRESSION:  1. S/P CABG x 4, 2006.PCI 11/10, cath this adm- Med Rx   2. Essential hypertension   3. Cardiomyopathy, ischemic, EF 40-45% 2D 11/10   4. Dyslipidemia   5. Obesity (BMI 30.0-34.9)   6. Medication management   7. Tobacco abuse counseling   8. Chronic obstructive pulmonary disease, unspecified COPD type [facility])       ASSESSMENT AND PLAN: Mr. Scherzer is a 66 year old male who is 12 years status post his initial presentation with an acute coronary syndrome in March 2006. He is status post CABG revascularization surgery in 2006 and has undergone intervention to the ostium and proximal RCA graft as well as the native diagonal vessel since  the graft to this vessel was occluded. He continues to smoke cigarettes and remotely had quit for short duration. He has significant airflow limitation documented on pulmonary function studies. His last [place] study was low risk and demonstrates moderate size distal antero-apical infarction with very minimal peri-infarction ischemia.  He spent exertional shortness of breath most likely related to his COPD.  He is not having any anginal symptoms. his blood pressure today is in the low-normal range on amlodipine 10 mg, isosorbide 60 mg, with metoprolol 100 mg bid, losartan HCT 100/12.5.  He continues to be on Ranexa 1000 mg twice a day.  For his COPD he is on Spiriva in addition to Advair.  He continues to tolerate atorvastatin 40 mg with target LDL less than 70.  Repeat fasting laboratory will be obtained and if he is not at target amlodipine will be increased to 80 mg or Zetia will be added to his regimen.  His GERD is controlled on Pepcid.  He continues to be on dual antiplatelet therapy.  He admits to 100% compliance with CPAP.  I again discussed importance of smoking cessation.  I will see him in 6 months for reevaluation.  Time spent: 25  minutes Troy Sine, MD, [facility]  01/01/2017 6:22 PM

## 2016-12-30 NOTE — Patient Instructions (Signed)
Your physician recommends that you return for lab work FASTING.   Your physician wants you to follow-up in: 6 months or sooner if needed. You will receive a reminder letter in the mail two months in advance. If you don't receive a letter, please call our office to schedule the follow-up appointment.   If you need a refill on your cardiac medications before your next appointment, please call your pharmacy.

## 2016-12-31 ENCOUNTER — Other Ambulatory Visit: Payer: Self-pay | Admitting: Cardiovascular Disease

## 2016-12-31 NOTE — Telephone Encounter (Signed)
Rx(s) sent to pharmacy electronically.  

## 2017-01-03 LAB — CBC
HEMATOCRIT: 45.6 % (ref 38.5–50.0)
HEMOGLOBIN: 15.2 g/dL (ref 13.2–17.1)
MCH: 31.2 pg (ref 27.0–33.0)
MCHC: 33.3 g/dL (ref 32.0–36.0)
MCV: 93.6 fL (ref 80.0–100.0)
MPV: 10.2 fL (ref 7.5–12.5)
Platelets: 272 10*3/uL (ref 140–400)
RBC: 4.87 MIL/uL (ref 4.20–5.80)
RDW: 13.6 % (ref 11.0–15.0)
WBC: 12.3 10*3/uL — AB (ref 3.8–10.8)

## 2017-01-03 LAB — COMPREHENSIVE METABOLIC PANEL
ALBUMIN: 3.9 g/dL (ref 3.6–5.1)
ALT: 13 U/L (ref 9–46)
AST: 14 U/L (ref 10–35)
Alkaline Phosphatase: 95 U/L (ref 40–115)
BUN: 15 mg/dL (ref 7–25)
CALCIUM: 9.4 mg/dL (ref 8.6–10.3)
CO2: 32 mmol/L — AB (ref 20–31)
Chloride: 95 mmol/L — ABNORMAL LOW (ref 98–110)
Creat: 1.32 mg/dL — ABNORMAL HIGH (ref 0.70–1.25)
GLUCOSE: 282 mg/dL — AB (ref 65–99)
POTASSIUM: 4.8 mmol/L (ref 3.5–5.3)
Sodium: 135 mmol/L (ref 135–146)
Total Bilirubin: 0.7 mg/dL (ref 0.2–1.2)
Total Protein: 7.2 g/dL (ref 6.1–8.1)

## 2017-01-03 LAB — LIPID PANEL
CHOL/HDL RATIO: 4.8 ratio (ref <5.0)
Cholesterol: 169 mg/dL (ref <200)
HDL: 35 mg/dL — AB (ref >40)
LDL CALC: 98 mg/dL (ref <100)
TRIGLYCERIDES: 181 mg/dL — AB (ref <150)
VLDL: 36 mg/dL — ABNORMAL HIGH (ref <30)

## 2017-01-03 LAB — TSH: TSH: 3.53 m[IU]/L (ref 0.40–4.50)

## 2017-01-04 LAB — HEMOGLOBIN A1C
Hgb A1c MFr Bld: 8.5 % — ABNORMAL HIGH (ref <5.7)
Mean Plasma Glucose: 197 mg/dL

## 2017-01-07 ENCOUNTER — Other Ambulatory Visit: Payer: Self-pay | Admitting: Cardiovascular Disease

## 2017-01-07 NOTE — Telephone Encounter (Signed)
Rx(s) sent to pharmacy electronically.  

## 2017-01-21 ENCOUNTER — Encounter: Payer: Self-pay | Admitting: *Deleted

## 2017-02-03 ENCOUNTER — Other Ambulatory Visit: Payer: Self-pay | Admitting: Cardiovascular Disease

## 2017-06-18 ENCOUNTER — Other Ambulatory Visit: Payer: Self-pay | Admitting: Cardiovascular Disease

## 2017-06-18 NOTE — Telephone Encounter (Signed)
Rx(s) sent to pharmacy electronically.  

## 2017-07-24 ENCOUNTER — Encounter: Payer: Self-pay | Admitting: Cardiovascular Disease

## 2017-07-24 ENCOUNTER — Ambulatory Visit (INDEPENDENT_AMBULATORY_CARE_PROVIDER_SITE_OTHER): Payer: Medicare Other | Admitting: Cardiovascular Disease

## 2017-07-24 VITALS — BP 118/66 | HR 65 | Ht 68.0 in | Wt 214.2 lb

## 2017-07-24 DIAGNOSIS — E785 Hyperlipidemia, unspecified: Secondary | ICD-10-CM

## 2017-07-24 DIAGNOSIS — Z951 Presence of aortocoronary bypass graft: Secondary | ICD-10-CM

## 2017-07-24 DIAGNOSIS — E118 Type 2 diabetes mellitus with unspecified complications: Secondary | ICD-10-CM | POA: Diagnosis not present

## 2017-07-24 DIAGNOSIS — F17200 Nicotine dependence, unspecified, uncomplicated: Secondary | ICD-10-CM

## 2017-07-24 DIAGNOSIS — I255 Ischemic cardiomyopathy: Secondary | ICD-10-CM

## 2017-07-24 DIAGNOSIS — J439 Emphysema, unspecified: Secondary | ICD-10-CM

## 2017-07-24 DIAGNOSIS — I2589 Other forms of chronic ischemic heart disease: Secondary | ICD-10-CM

## 2017-07-24 DIAGNOSIS — E669 Obesity, unspecified: Secondary | ICD-10-CM

## 2017-07-24 DIAGNOSIS — Z79899 Other long term (current) drug therapy: Secondary | ICD-10-CM | POA: Diagnosis not present

## 2017-07-24 MED ORDER — METFORMIN HCL 500 MG PO TABS: 500.0000 mg | ORAL_TABLET | Freq: Two times a day (BID) | ORAL | 0 refills | Status: DC

## 2017-07-24 MED ORDER — EZETIMIBE 10 MG PO TABS: 10.0000 mg | ORAL_TABLET | Freq: Every day | ORAL | 3 refills | Status: DC

## 2017-07-24 NOTE — Progress Notes (Signed)
Patient ID: Troy [name], male   DOB: 1951-06-12, 66 y.o.   MRN: [id number]     HPI: Troy [name] is a 66 y.o. male presents to the office for a 7 month cardiology follow up evaluation.  Troy [name] suffered an acute coronary syndrome in March 2006 and underwent CABG surgery with LIMA to the LAD, vein to the diagonal, vein to the obtuse marginal, and vein to the PDA. In November 2010 he underwent PCI to the ostium of the proximal  graft to the RCA as well as native diagonal since the graft that supplied the diagonal was found to be occluded. His last catheterization in January 2013 showed a patent stent in the vein graft to the RCA, the  LIMA was small and nearly atretic and did not reach the LAD. Vein graft to diagonal was occluded. The vein graft to the marginal vessel was widely patent. Unfortunately, he continue to have significant tobacco use. He has a history of hyperlipidemia, hypertension, and uses supplemental oxygen at night. At times he does note some episodes of chest pain which have improved with initiation of Ranexa therapy.  A nuclear perfusion study on 06/15/2013 was interpreted as a low risk study demonstrating moderate size distal antero-apical infarction with very mild preinfarction ischemia. He did have antero-apical dyskinesis and septal wall motion consistent with his prior sternotomy. He has felt improved.  Last year, he quit smoking for very short duration but unfortunately resumed his long-standing habit.  He is smoking one pack per day.  He admits to shortness of breath with activity.  He denies any episodes of chest tightness.  His medical regimen consists of amlodipine 10 mg, isosorbide 60 mg losartan HCT 100/12.5 Toprol-XL 100 mg Ranexa 1000 mg twice a day for his hypertension as well as CAD.  He continues on dual antiplatelet therapy without bleeding issues.  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  When I last saw him , he has continued to  smoke cigarettes on a daily basis.   He was eating ice cream donuts and candy frequently.  He denied any chest pain.  He was not exercising.   Since his last office visit, laboratory had revealed  hemoglobin A1c at 8.5.  His glucose was 282.  Creatinine 1.32.  Lipid studies showed cholesterol 169 with elevation of triglycerides 181, increased VLDL at 36, and an LDL of 98.  He continues to smoke.  He sees Dr. Elijio [name] in [place] for primary care.  He presents for evaluation.    Past Medical History:  Diagnosis Date  . Anxiety   . COPD (chronic obstructive pulmonary disease) ([place])   . Coronary artery disease 2006   Acute coronary syndrome in March 2006.   [name] Dyslipidemia   . Hypertension 10/09/2010   EF 40-45%    Past Surgical History:  Procedure Laterality Date  . Arterial evalation      no evidence of ICA stenosis  . CARDIAC CATHETERIZATION    . CARDIAC SURGERY    . CORONARY ARTERY BYPASS GRAFT  2006   Post CABG surgery with a LIMA to the LAD, a vein to the diagonal ,,a vein to the the obtuse marginal and vein to the PDA.  [name] CORONARY STENT PLACEMENT  2010  . GRAFT(S) ANGIOGRAM  10/21/2011   Procedure: GRAFT(S) Troy [name];  Surgeon: Troy Man, MD;  Location: [facility] CATH LAB;  Service: Cardiovascular;;  . LEFT HEART CATHETERIZATION WITH CORONARY ANGIOGRAM N/A 10/21/2011  Procedure: LEFT HEART CATHETERIZATION WITH CORONARY ANGIOGRAM;  Surgeon: Troy Man, MD;  Location: [facility] CATH LAB;  Service: Cardiovascular;  Laterality: N/A;    Allergies  Allergen Reactions  . Codeine Nausea And Vomiting    Pt states when he takes codeine, he throws up blood  . Niacin And Related     "made his body feel hot & he had a hard time breathing"    Current Outpatient Prescriptions  Medication Sig Dispense Refill  . ADVAIR DISKUS 500-50 MCG/DOSE AEPB Inhale 1 puff into the lungs 2 (two) times daily as needed.  2  . ALPRAZolam (XANAX) 0.25 MG tablet Take 0.25 mg by mouth at bedtime as needed for  sleep.    [name] amLODipine (NORVASC) 10 MG tablet Take 10 mg by mouth daily.    [name] atorvastatin (LIPITOR) 40 MG tablet TAKE 1 TABLET DAILY AT BEDTIME. 30 tablet 6  . busPIRone (BUSPAR) 7.5 MG tablet Take 1 tablet by mouth 2 (two) times daily.    . clopidogrel (PLAVIX) 75 MG tablet Take 1 tablet (75 mg total) by mouth daily. 90 tablet 2  . famotidine (PEPCID) 20 MG tablet TAKE 1 TABLET BY MOUTH DAILY. 30 tablet 6  . Fluticasone-Salmeterol (ADVAIR) 100-50 MCG/DOSE AEPB Inhale 1 puff into the lungs every 12 (twelve) hours.      . INCRUSE ELLIPTA 62.5 MCG/INH AEPB INHALE 1 PUFF INTO THE LUNGS DAILY  2  . Ipratropium-Albuterol (COMBIVENT) 20-100 MCG/ACT AERS respimat Inhale 1 puff into the lungs every 6 (six) hours.    . isosorbide mononitrate (IMDUR) 60 MG 24 hr tablet TAKE 1 TABLET BY MOUTH EVERY DAY 90 tablet 1  . losartan-hydrochlorothiazide (HYZAAR) 100-12.5 MG tablet Take 1 tablet by mouth daily. 30 tablet 10  . metoprolol (TOPROL-XL) 200 MG 24 hr tablet TAKE 0.5 TABLETS (100 MG TOTAL) BY MOUTH 2 (TWO) TIMES DAILY. 30 tablet 6  . nitroGLYCERIN (NITROSTAT) 0.4 MG SL tablet Place 0.4 mg under the tongue every 5 (five) minutes as needed for chest pain.    [name] RANEXA 1000 MG SR tablet TAKE 1 TABLET (1,000 MG TOTAL) BY MOUTH 2 (TWO) TIMES DAILY. 60 tablet 8  . tiotropium (SPIRIVA) 18 MCG inhalation capsule Place 18 mcg into inhaler and inhale daily.      [name] ezetimibe (ZETIA) 10 MG tablet Take 1 tablet (10 mg total) by mouth daily. 90 tablet 3  . metFORMIN (GLUCOPHAGE) 500 MG tablet Take 1 tablet (500 mg total) by mouth 2 (two) times daily with a meal. 180 tablet 0   No current facility-administered medications for this visit.     Social History   Social History  . Marital status: Married    Spouse name: N/A  . Number of children: N/A  . Years of education: N/A   Occupational History  . Not on file.   Social History Main Topics  . Smoking status: Current Every Day Smoker    Packs/day: 2.00     Years: 50.00    Types: Cigarettes  . Smokeless tobacco: Former Systems developer    Types: Chew    Quit date: 01/23/2013  . Alcohol use No  . Drug use: Unknown  . Sexual activity: Not on file   Other Topics Concern  . Not on file   Social History Narrative  . No narrative on file    Family History  Problem Relation Age of Onset  . Heart attack Father 87  . Hypertension Mother    Socially he continues to  smoke.  He has been under increased stress.  His wife recently left him.  ROS General: Negative; No fevers, chills, or night sweats;  HEENT: Negative; No changes in vision or hearing, sinus congestion, difficulty swallowing Pulmonary: Positive for wheezing, shortness of breath,  Cardiovascular: see HPI GI: Negative; No nausea, vomiting, diarrhea, or abdominal pain GU: Negative; No dysuria, hematuria, or difficulty voiding Musculoskeletal: Negative; no myalgias, joint pain, or weakness Hematologic/Oncology: Negative; no easy bruising, bleeding Endocrine: Positive for poorly controlled diabetes Neuro: Positive for neuropathy Skin: Negative; No rashes or skin lesions Psychiatric: Negative; No behavioral problems, depression Sleep: Positive for OSA on CPAP Other comprehensive 14 point system review is negative.  PE BP 118/66   Pulse 65   Ht _0  (1.727 m)   Wt 214 lb 3.2 oz (97.2 kg)   BMI 32.57 kg/m .  Repeat blood pressure 120/68  Wt Readings from Last 3 Encounters:  07/24/17 214 lb 3.2 oz (97.2 kg)  12/30/16 224 lb (101.6 kg)  10/04/15 215 lb (97.5 kg)   General: Alert, oriented, no distress.  Skin: normal turgor, no rashes, warm and dry HEENT: Normocephalic, atraumatic. Pupils equal round and reactive to light; sclera anicteric; extraocular muscles intact; bearded Nose without nasal septal hypertrophy Mouth/Parynx benign; Mallinpatti scale 3 Neck: No JVD, no carotid bruits; normal carotid upstroke Lungs: Diffusely decreased breath sounds.  No wheezing presently. Chest  wall: without tenderness to palpitation Heart: PMI not displaced, RRR, s1 s2 normal, 1/6 systolic murmur, no diastolic murmur, no rubs, gallops, thrills, or heaves Abdomen: Central adiposity soft, nontender; no hepatosplenomehaly, BS+; abdominal aorta nontender and not dilated by palpation. Back: no CVA tenderness Pulses 2+ Musculoskeletal: full range of motion, normal strength, no joint deformities Extremities: no clubbing cyanosis or edema, Homan's sign negative  Neurologic: grossly nonfocal; Cranial nerves grossly wnl Psychologic: Normal mood and affect   ECG (independently read by me): Normal sinus rhythm at 65 bpm.  Nonspecific ST changes.  QTc interval 455 ms.  March 2018 ECG (independently read by me):Normal sinus rhythm at 67 bpm.  Nonspecific ST changes.  December 2016 ECG (independently read by me): Normal sinus rhythm at 69 bpm.  Nonspecific T changes.  QTc interval 469 ms.  June 2016 ECG (independently read by me): Sinus bradycardia 59 bpm. Anterolateral ST changes  November 2015 ECG (independently read by me): Sinus rhythm at 69 bpm.  Nonspecific ST changes.  No ectopy.  QTc interval 458 ms  ECG (independently read by me): Sinus rhythm at 59 beats per minute. Nondiagnostic anterolateral T changes  LABS:  BMP Latest Ref Rng & Units 01/03/2017 07/11/2016 07/13/2014  Glucose 65 - 99 mg/dL 282(H) 106(H) 102(H)  BUN 7 - 25 mg/dL _1 Creatinine 0.70 - 1.25 mg/dL 1.32(H) 1.25 1.49(H)  Sodium 135 - 146 mmol/L 135 138 139  Potassium 3.5 - 5.3 mmol/L 4.8 4.4 4.1  Chloride 98 - 110 mmol/L 95(L) 98 107  CO2 20 - 31 mmol/L 32(H) 30 26  Calcium 8.6 - 10.3 mg/dL 9.4 9.3 8.7   Hepatic Function Latest Ref Rng & Units 01/03/2017 07/11/2016 07/13/2014  Total Protein 6.1 - 8.1 g/dL 7.2 7.1 7.3  Albumin 3.6 - 5.1 g/dL 3.9 3.9 3.5  AST 10 - 35 U/L 14 15 14(L)  ALT 9 - 46 U/L _2 Alk Phosphatase 40 - 115 U/L 95 88 78  Total Bilirubin 0.2 - 1.2 mg/dL 0.7 0.7 0.8   CBC Latest Ref  Rng & Units  01/03/2017 07/11/2016 07/13/2014  WBC 3.8 - 10.8 K/uL 12.3(H) 10.1 13.1(H)  Hemoglobin 13.2 - 17.1 g/dL 15.2 14.4 13.9  Hematocrit 38.5 - 50.0 % 45.6 43.2 41.8  Platelets 140 - 400 K/uL 272 247 248   Lab Results  Component Value Date   MCV 93.6 01/03/2017   MCV 90.6 07/11/2016   MCV 93 07/13/2014   Lab Results  Component Value Date   TSH 3.53 01/03/2017   Lipid Panel     Component Value Date/Time   CHOL 169 01/03/2017 1303   TRIG 181 (H) 01/03/2017 1303   HDL 35 (L) 01/03/2017 1303   CHOLHDL 4.8 01/03/2017 1303   VLDL 36 (H) 01/03/2017 1303   LDLCALC 98 01/03/2017 1303     IMPRESSION:  1. Dyslipidemia   2. Cardiomyopathy, ischemic, EF 40-45% 2D 11/10   3. S/P CABG x 4, 2006.PCI 11/10, cath this adm- Med Rx   4. Medication management   5. Obesity (BMI 30.0-34.9)   6. Smoking   7. Pulmonary emphysema, unspecified emphysema type ([place])   8. Type 2 diabetes mellitus with complication, without long-term current use of insulin [facility])       ASSESSMENT AND PLAN: Mr. Roam is a 66 year old male who is  status post his initial presentation with an acute coronary syndrome in March 2006. He underwent CABG revascularization surgery in 2006 and has undergone intervention to the ostium and proximal RCA graft as well as the native diagonal vessel since  the graft to this vessel was occluded. He continues to smoke cigarettes and remotely had quit for short duration.  He had smoked 2 packs per day for many years and currently is smoking 3 packs per week.  He has significant airflow limitation documented on pulmonary function studies. His last [place] study was low risk and demonstrates moderate size distal antero-apical infarction with very minimal peri-infarction ischemia.  He spent exertional shortness of breath most likely related to his COPD.  Presently, he is without anginal symptomatology on an anti-ischemic medicine consisting of Ranexa 1000 g twice a day, Toprol 100  mg twice a day, losartan HCT 100/12.5, isosorbide 60 mg daily.'s on inhalers for his COPD including Combivent,Incruse ellipta,, Spiriva and Advair.  He continues on antiplatelet therapy with aspirin and Plavix.  His GERD is controlled with Pepcid.  He is diabetic and is not on any therapy.  His hemoglobin A1c was 8.5.  I am starting metformin 500 mg twice a day and have recommended he follow-up with his primary physician for diabetic management.  I'm also adding Zetia 10 mg to his current dose of atorvastatin 40 mg.  Since he is on Ranexa, the maximum recommended dose of atorvastatin is 40 mg and for this reason, I will not titrate to 80 mg.  Repeat blood work will be obtained in 3 months.  I will see him in 4-6 months for reevaluation. Time spent: 25 minutes Troy Sine, MD, [facility]  07/26/2017 3:20 PM

## 2017-07-24 NOTE — Patient Instructions (Signed)
Medication Instructions:  START metformin 500 mg (1 tablet) two times daily-follow up with PCP  START Zetia 10 mg (1 tablet) daily  Labwork: Please return for FASTING labs in 3 MONTHS (CMET, Lipid)  Our in office lab hours are Monday-Friday 8:00-4:30, closed for lunch 1-2 pm.  No appointment needed.   Follow-Up: Your physician recommends that you schedule a follow-up appointment in: 4-5 months with Dr. [name]    Any Other Special Instructions Will Be Listed Below (If Applicable).     If you need a refill on your cardiac medications before your next appointment, please call your pharmacy.

## 2017-07-29 NOTE — Addendum Note (Signed)
Addended by: Zebedee Iba on: 07/29/2017 08:10 AM   Modules accepted: Orders

## 2017-08-20 ENCOUNTER — Other Ambulatory Visit: Payer: Self-pay | Admitting: Cardiovascular Disease

## 2017-08-20 NOTE — Telephone Encounter (Signed)
Rx has been sent to the pharmacy electronically. ° °

## 2017-09-20 ENCOUNTER — Other Ambulatory Visit: Payer: Self-pay | Admitting: Cardiovascular Disease

## 2017-11-23 ENCOUNTER — Other Ambulatory Visit: Payer: Self-pay | Admitting: Cardiovascular Disease

## 2017-11-24 NOTE — Telephone Encounter (Signed)
REFILL 

## 2017-12-22 ENCOUNTER — Encounter: Payer: Self-pay | Admitting: Cardiovascular Disease

## 2017-12-22 ENCOUNTER — Ambulatory Visit (INDEPENDENT_AMBULATORY_CARE_PROVIDER_SITE_OTHER): Payer: Medicare Other | Admitting: Cardiovascular Disease

## 2017-12-22 VITALS — BP 105/62 | HR 90 | Ht 68.0 in | Wt 196.6 lb

## 2017-12-22 DIAGNOSIS — Z716 Tobacco abuse counseling: Secondary | ICD-10-CM

## 2017-12-22 DIAGNOSIS — Z951 Presence of aortocoronary bypass graft: Secondary | ICD-10-CM | POA: Diagnosis not present

## 2017-12-22 DIAGNOSIS — Z79899 Other long term (current) drug therapy: Secondary | ICD-10-CM | POA: Diagnosis not present

## 2017-12-22 DIAGNOSIS — I255 Ischemic cardiomyopathy: Secondary | ICD-10-CM

## 2017-12-22 DIAGNOSIS — I10 Essential (primary) hypertension: Secondary | ICD-10-CM | POA: Diagnosis not present

## 2017-12-22 DIAGNOSIS — E785 Hyperlipidemia, unspecified: Secondary | ICD-10-CM | POA: Diagnosis not present

## 2017-12-22 DIAGNOSIS — J439 Emphysema, unspecified: Secondary | ICD-10-CM

## 2017-12-22 NOTE — Progress Notes (Signed)
Patient ID: Troy [name], male   DOB: January 02, 1951, 67 y.o.   MRN: [id number]     HPI: Troy [name] is a 67 y.o. male presents to the office for a 5 month cardiology follow up evaluation.  Troy [name] suffered an acute coronary syndrome in March 2006 and underwent CABG surgery with LIMA to the LAD, vein to the diagonal, vein to the obtuse marginal, and vein to the PDA. In November 2010 he underwent PCI to the ostium of the proximal  graft to the RCA as well as native diagonal since the graft that supplied the diagonal was found to be occluded. His last catheterization in January 2013 showed a patent stent in the vein graft to the RCA, the  LIMA was small and nearly atretic and did not reach the LAD. Vein graft to diagonal was occluded. The vein graft to the marginal vessel was widely patent. Unfortunately, he continue to have significant tobacco use. He has a history of hyperlipidemia, hypertension, and uses supplemental oxygen at night. At times he does note some episodes of chest pain which have improved with initiation of Ranexa therapy.  A nuclear perfusion study on 06/15/2013 was interpreted as a low risk study demonstrating moderate size distal antero-apical infarction with very mild preinfarction ischemia. He did have antero-apical dyskinesis and septal wall motion consistent with his prior sternotomy. He has felt improved.  Last year, he quit smoking for very short duration but unfortunately resumed his long-standing habit.  He is smoking one pack per day.  He admits to shortness of breath with activity.  He denies any episodes of chest tightness.  His medical regimen consists of amlodipine 10 mg, isosorbide 60 mg losartan HCT 100/12.5 Toprol-XL 100 mg Ranexa 1000 mg twice a day for his hypertension as well as CAD.  He continues on dual antiplatelet therapy without bleeding issues.  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   L:aboratory from his primary M.D. in March  2018 had revealed  hemoglobin A1c at 8.5.  His glucose was 282.  Creatinine 1.32.  Lipid studies showed cholesterol 169 with elevation of triglycerides 181, increased VLDL at 36, and an LDL of 98.  He continues to smoke.  He sees Troy [name] in [place] for primary care.    Since I last saw her, unfortunately his younger brother passed away.  He has been somewhat depressed and has not been eating as well.  This has resulted in an 18 pound weight loss from 214 pounds down to 196 pounds.  Unfortunately, he is still smoking a significant amount and does not exercise.  He denies any recurrent anginal symptomatology.  He admits to shortness of breath with activity.  He has COPD.  He presents for reevaluation.  Past Medical History:  Diagnosis Date  . Anxiety   . COPD (chronic obstructive pulmonary disease) ([place])   . Coronary artery disease 2006   Acute coronary syndrome in March 2006.   [name] Dyslipidemia   . Hypertension 10/09/2010   EF 40-45%    Past Surgical History:  Procedure Laterality Date  . Arterial evalation      no evidence of ICA stenosis  . CARDIAC CATHETERIZATION    . CARDIAC SURGERY    . CORONARY ARTERY BYPASS GRAFT  2006   Post CABG surgery with a LIMA to the LAD, a vein to the diagonal ,,a vein to the the obtuse marginal and vein to the PDA.  [name] CORONARY STENT PLACEMENT  2010  . GRAFT(S) ANGIOGRAM  10/21/2011   Procedure: GRAFT(S) Cyril Loosen;  Surgeon: Leonie Man, MD;  Location: [facility] CATH LAB;  Service: Cardiovascular;;  . LEFT HEART CATHETERIZATION WITH CORONARY ANGIOGRAM N/A 10/21/2011   Procedure: LEFT HEART CATHETERIZATION WITH CORONARY ANGIOGRAM;  Surgeon: Leonie Man, MD;  Location: [facility] CATH LAB;  Service: Cardiovascular;  Laterality: N/A;    Allergies  Allergen Reactions  . Codeine Nausea And Vomiting    Pt states when he takes codeine, he throws up blood  . Niacin And Related     "made his body feel hot & he had a hard time breathing"    Current Outpatient  Medications  Medication Sig Dispense Refill  . ADVAIR DISKUS 500-50 MCG/DOSE AEPB Inhale 1 puff into the lungs 2 (two) times daily as needed.  2  . ALPRAZolam (XANAX) 0.25 MG tablet Take 0.25 mg by mouth at bedtime as needed for sleep.    [name] amLODipine (NORVASC) 10 MG tablet Take 10 mg by mouth daily.    [name] atorvastatin (LIPITOR) 40 MG tablet TAKE 1 TABLET DAILY AT BEDTIME. 90 tablet 3  . busPIRone (BUSPAR) 7.5 MG tablet Take 1 tablet by mouth 2 (two) times daily.    . clopidogrel (PLAVIX) 75 MG tablet Take 1 tablet (75 mg total) by mouth daily. 90 tablet 2  . ezetimibe (ZETIA) 10 MG tablet Take 1 tablet (10 mg total) by mouth daily. 90 tablet 3  . famotidine (PEPCID) 20 MG tablet TAKE 1 TABLET BY MOUTH DAILY. 30 tablet 6  . Fluticasone-Salmeterol (ADVAIR) 100-50 MCG/DOSE AEPB Inhale 1 puff into the lungs every 12 (twelve) hours.      . INCRUSE ELLIPTA 62.5 MCG/INH AEPB INHALE 1 PUFF INTO THE LUNGS DAILY  2  . Ipratropium-Albuterol (COMBIVENT) 20-100 MCG/ACT AERS respimat Inhale 1 puff into the lungs every 6 (six) hours.    . isosorbide mononitrate (IMDUR) 60 MG 24 hr tablet TAKE 1 TABLET BY MOUTH EVERY DAY 90 tablet 1  . losartan-hydrochlorothiazide (HYZAAR) 100-12.5 MG tablet Take 1 tablet by mouth daily. 30 tablet 10  . metFORMIN (GLUCOPHAGE) 500 MG tablet Take 1 tablet (500 mg total) by mouth 2 (two) times daily with a meal. 180 tablet 0  . metoprolol (TOPROL-XL) 200 MG 24 hr tablet TAKE 0.5 TABLETS (100 MG TOTAL) BY MOUTH 2 (TWO) TIMES DAILY. 30 tablet 6  . nitroGLYCERIN (NITROSTAT) 0.4 MG SL tablet Place 0.4 mg under the tongue every 5 (five) minutes as needed for chest pain.    [name] RANEXA 1000 MG SR tablet TAKE 1 TABLET (1,000 MG TOTAL) BY MOUTH 2 (TWO) TIMES DAILY. 60 tablet 5  . tiotropium (SPIRIVA) 18 MCG inhalation capsule Place 18 mcg into inhaler and inhale daily.       No current facility-administered medications for this visit.     Social History   Socioeconomic History  .  Marital status: Married    Spouse name: Not on file  . Number of children: Not on file  . Years of education: Not on file  . Highest education level: Not on file  Social Needs  . Financial resource strain: Not on file  . Food insecurity - worry: Not on file  . Food insecurity - inability: Not on file  . Transportation needs - medical: Not on file  . Transportation needs - non-medical: Not on file  Occupational History  . Not on file  Tobacco Use  . Smoking status: Current Every Day Smoker  Packs/day: 2.00    Years: 50.00    Pack years: 100.00    Types: Cigarettes  . Smokeless tobacco: Former Systems developer    Types: [place] date: 01/23/2013  Substance and Sexual Activity  . Alcohol use: No  . Drug use: Not on file  . Sexual activity: Not on file  Other Topics Concern  . Not on file  Social History Narrative  . Not on file    Family History  Problem Relation Age of Onset  . Hypertension Mother   . Heart attack Father 42   Socially he continues to smoke.  He has been under increased stress.  His wife recently left him.  ROS General: Negative; No fevers, chills, or night sweats;  HEENT: Negative; No changes in vision or hearing, sinus congestion, difficulty swallowing Pulmonary: Positive for wheezing, shortness of breath,  Cardiovascular: see HPI GI: Negative; No nausea, vomiting, diarrhea, or abdominal pain GU: Negative; No dysuria, hematuria, or difficulty voiding Musculoskeletal: Negative; no myalgias, joint pain, or weakness Hematologic/Oncology: Negative; no easy bruising, bleeding Endocrine: Positive for poorly controlled diabetes Neuro: Positive for neuropathy Skin: Negative; No rashes or skin lesions Psychiatric: Negative; No behavioral problems, depression Sleep: Positive for OSA on CPAP Other comprehensive 14 point system review is negative.  PE BP 105/62   Pulse 90   Ht _0  (1.727 m)   Wt 196 lb 9.6 oz (89.2 kg)   BMI 29.89 kg/m .  Repeat blood  pressure 118/64  Wt Readings from Last 3 Encounters:  12/22/17 196 lb 9.6 oz (89.2 kg)  07/24/17 214 lb 3.2 oz (97.2 kg)  12/30/16 224 lb (101.6 kg)   General: Alert, oriented, no distress.  Bearded;  Skin: normal turgor, no rashes, warm and dry HEENT: Normocephalic, atraumatic. Pupils equal round and reactive to light; sclera anicteric; extraocular muscles intact;  Nose without nasal septal hypertrophy Mouth/Parynx benign; Mallinpatti scale 3 Neck: No JVD, no carotid bruits; normal carotid upstroke Lungs: Diffusely decreased breath sounds from long-standing tobacco use Chest wall: without tenderness to palpitation Heart: PMI not displaced, RRR, s1 s2 normal, 1/6 systolic murmur, no diastolic murmur, no rubs, gallops, thrills, or heaves Abdomen: Central adiposity soft, nontender; no hepatosplenomehaly, BS+; abdominal aorta nontender and not dilated by palpation. Back: no CVA tenderness Pulses 2+ Musculoskeletal: full range of motion, normal strength, no joint deformities Extremities: no clubbing cyanosis or edema, Homan's sign negative  Neurologic: grossly nonfocal; Cranial nerves grossly wnl Psychologic: Normal mood and affect   ECG (independently read by me): Normal sinus rhythm at 68 bpm.  No significant ST-T changes.  No ectopy.   October 2018 ECG (independently read by me): Normal sinus rhythm at 65 bpm.  Nonspecific ST changes.  QTc interval 455 ms.  March 2018 ECG (independently read by me):Normal sinus rhythm at 67 bpm.  Nonspecific ST changes.  December 2016 ECG (independently read by me): Normal sinus rhythm at 69 bpm.  Nonspecific T changes.  QTc interval 469 ms.  June 2016 ECG (independently read by me): Sinus bradycardia 59 bpm. Anterolateral ST changes  November 2015 ECG (independently read by me): Sinus rhythm at 69 bpm.  Nonspecific ST changes.  No ectopy.  QTc interval 458 ms  ECG (independently read by me): Sinus rhythm at 59 beats per minute. Nondiagnostic  anterolateral T changes  LABS:  BMP Latest Ref Rng & Units 01/03/2017 07/11/2016 07/13/2014  Glucose 65 - 99 mg/dL 282(H) 106(H) 102(H)  BUN 7 - 25 mg/dL 15 11  16  Creatinine 0.70 - 1.25 mg/dL 1.32(H) 1.25 1.49(H)  Sodium 135 - 146 mmol/L 135 138 139  Potassium 3.5 - 5.3 mmol/L 4.8 4.4 4.1  Chloride 98 - 110 mmol/L 95(L) 98 107  CO2 20 - 31 mmol/L 32(H) 30 26  Calcium 8.6 - 10.3 mg/dL 9.4 9.3 8.7   Hepatic Function Latest Ref Rng & Units 01/03/2017 07/11/2016 07/13/2014  Total Protein 6.1 - 8.1 g/dL 7.2 7.1 7.3  Albumin 3.6 - 5.1 g/dL 3.9 3.9 3.5  AST 10 - 35 U/L 14 15 14(L)  ALT 9 - 46 U/L _0 Alk Phosphatase 40 - 115 U/L 95 88 78  Total Bilirubin 0.2 - 1.2 mg/dL 0.7 0.7 0.8   CBC Latest Ref Rng & Units 01/03/2017 07/11/2016 07/13/2014  WBC 3.8 - 10.8 K/uL 12.3(H) 10.1 13.1(H)  Hemoglobin 13.2 - 17.1 g/dL 15.2 14.4 13.9  Hematocrit 38.5 - 50.0 % 45.6 43.2 41.8  Platelets 140 - 400 K/uL 272 247 248   Lab Results  Component Value Date   MCV 93.6 01/03/2017   MCV 90.6 07/11/2016   MCV 93 07/13/2014   Lab Results  Component Value Date   TSH 3.53 01/03/2017   Lipid Panel     Component Value Date/Time   CHOL 169 01/03/2017 1303   TRIG 181 (H) 01/03/2017 1303   HDL 35 (L) 01/03/2017 1303   CHOLHDL 4.8 01/03/2017 1303   VLDL 36 (H) 01/03/2017 1303   LDLCALC 98 01/03/2017 1303     IMPRESSION:  1. S/P CABG x 4, 2006.PCI 11/10, cath this adm- Med Rx   2. Cardiomyopathy, ischemic, EF 40-45% 2D 11/10   3. Dyslipidemia   4. Essential hypertension   5. Medication management   6. Pulmonary emphysema, unspecified emphysema type ([place])   7. Tobacco abuse counseling       ASSESSMENT AND PLAN: Troy [name] is a 67 year old male who is status post his initial presentation with an acute coronary syndrome in March 2006. He underwent CABG revascularization surgery in 2006 and has undergone intervention to the ostium and proximal RCA graft as well as the native diagonal vessel since  the graft to this vessel was occluded. He continues to smoke cigarettes and remotely had quit for short duration.  He had smoked 2 packs per day for many years and currently is smoking last.  He has significant airflow limitation documented on pulmonary function studies. His last [place]ay study was low risk and demonstrates moderate size distal antero-apical infarction with very minimal peri-infarction ischemia.  Kidneys have exertional shortness of breath, which most likely is related to his significant COPD.  He is on Combivent,Spiriva, incruse ellipta, and Advair for COPD.  His blood pressure today is stable on amlodipine 10 mg, losartan HCT 100/12.5, and metoprolol 100 mg twice a day.  I last saw him, I felt he was diabetic and not on therapy.  I initiated metformin in light of his hemoglobin A1c of 8.5.  When I last saw Ms. LDL was 98 on atorvastatin and Zetia 10 mg was added to his regimen in attempt to be more aggressive with LDL lowering.  Less than 70.  It does not appear that he has had follow-up blood work.  Since these medication adjustments.  These will need to be done.  I will check with his primary physician.  I again discussed importance of smoking cessation.  Time spent: 25 minutes Troy Sine, MD, [facility]  12/24/2017 7:49 PM

## 2017-12-22 NOTE — Patient Instructions (Signed)
Medication Instructions:  Your physician recommends that you continue on your current medications as directed. Please refer to the Current Medication list given to you today.  Labwork: Please return for FASTING labs (CMET, CBC, Lipid, TSH)  Our in office lab hours are Monday-Friday 8:00-4:00, closed for lunch 12:45-1:45 pm.  No appointment needed.  Follow-Up: Your physician wants you to follow-up in: 6 months with Dr. [name]. You will receive a reminder letter in the mail two months in advance. If you don't receive a letter, please call our office to schedule the follow-up appointment.   Any Other Special Instructions Will Be Listed Below (If Applicable).     If you need a refill on your cardiac medications before your next appointment, please call your pharmacy.

## 2017-12-24 ENCOUNTER — Encounter: Payer: Self-pay | Admitting: Cardiovascular Disease

## 2018-01-19 ENCOUNTER — Other Ambulatory Visit: Payer: Self-pay | Admitting: Cardiovascular Disease

## 2018-01-20 NOTE — Telephone Encounter (Signed)
REFILL 

## 2018-02-17 ENCOUNTER — Telehealth: Payer: Self-pay

## 2018-02-17 NOTE — Telephone Encounter (Signed)
Patient has been advised to call and schedule appt for oxygen recert and cpap, he needs recert for day and night per [facility]. Beth

## 2018-03-03 ENCOUNTER — Ambulatory Visit (INDEPENDENT_AMBULATORY_CARE_PROVIDER_SITE_OTHER): Payer: Medicare Other | Admitting: Internal Medicine

## 2018-03-03 ENCOUNTER — Encounter: Payer: Self-pay | Admitting: Internal Medicine

## 2018-03-03 VITALS — BP 116/62 | HR 83 | Resp 16 | Ht 68.0 in | Wt 193.4 lb

## 2018-03-03 DIAGNOSIS — J439 Emphysema, unspecified: Secondary | ICD-10-CM

## 2018-03-03 DIAGNOSIS — I5022 Chronic systolic (congestive) heart failure: Secondary | ICD-10-CM | POA: Diagnosis not present

## 2018-03-03 DIAGNOSIS — I255 Ischemic cardiomyopathy: Secondary | ICD-10-CM | POA: Diagnosis not present

## 2018-03-03 DIAGNOSIS — Z9989 Dependence on other enabling machines and devices: Secondary | ICD-10-CM

## 2018-03-03 DIAGNOSIS — F17200 Nicotine dependence, unspecified, uncomplicated: Secondary | ICD-10-CM

## 2018-03-03 DIAGNOSIS — G4733 Obstructive sleep apnea (adult) (pediatric): Secondary | ICD-10-CM

## 2018-03-03 DIAGNOSIS — R0602 Shortness of breath: Secondary | ICD-10-CM

## 2018-03-03 NOTE — Progress Notes (Signed)
[facility] [place], [place] [address]  Pulmonary Sleep Medicine   Office Visit Note  Patient Name: Troy [name] DOB: 1950/12/07 MRN [id number]  Date of Service: 03/03/2018  Complaints/HPI:   Patient is here for follow-up of sleep apnea COPD oxygen dependence.  Basically he continues to smoke unfortunately.  He is using his CPAP with the oxygen at nighttime.  Patient is here for pre confirmation of need for oxygen.  During the daytime does not need oxygen the non desaturating as was demonstrated by his 6 been ambulatory walk.  Patient will need an overnight oximetry done however.  He does have some shortness of breath he does have cough and some congestion noted.  He does uses medications as prescribed and that includes Advair.  ROS  General: (-) fever, (-) chills, (-) night sweats, (-) weakness Skin: (-) rashes, (-) itching,. Eyes: (-) visual changes, (-) redness, (-) itching. Nose and Sinuses: (-) nasal stuffiness or itchiness, (-) postnasal drip, (-) nosebleeds, (-) sinus trouble. Mouth and Throat: (-) sore throat, (-) hoarseness. Neck: (-) swollen glands, (-) enlarged thyroid, (-) neck pain. Respiratory: + cough, (-) bloody sputum, + shortness of breath, + wheezing. Cardiovascular: - ankle swelling, (-) chest pain. Lymphatic: (-) lymph node enlargement. Neurologic: (-) numbness, (-) tingling. Psychiatric: (-) anxiety, (-) depression   Current Medication: Outpatient Encounter Medications as of 03/03/2018  Medication Sig Note  . ADVAIR DISKUS 500-50 MCG/DOSE AEPB Inhale 1 puff into the lungs 2 (two) times daily as needed. 09/05/2014: Received from: External Pharmacy Received Sig: as directed  . ALPRAZolam (XANAX) 0.25 MG tablet Take 0.25 mg by mouth at bedtime as needed for sleep.   [name] amLODipine (NORVASC) 10 MG tablet Take 10 mg by mouth daily.   [name] atorvastatin (LIPITOR) 40 MG tablet TAKE 1 TABLET DAILY AT BEDTIME.   . busPIRone (BUSPAR) 7.5 MG tablet  Take 1 tablet by mouth 2 (two) times daily. 09/05/2014: Received from: External Pharmacy Received Sig:   . clopidogrel (PLAVIX) 75 MG tablet Take 1 tablet (75 mg total) by mouth daily.   [name] ezetimibe (ZETIA) 10 MG tablet Take 1 tablet (10 mg total) by mouth daily.   . famotidine (PEPCID) 20 MG tablet TAKE 1 TABLET BY MOUTH DAILY.   [name] Fluticasone-Salmeterol (ADVAIR) 100-50 MCG/DOSE AEPB Inhale 1 puff into the lungs every 12 (twelve) hours.     . INCRUSE ELLIPTA 62.5 MCG/INH AEPB INHALE 1 PUFF INTO THE LUNGS DAILY   . Ipratropium-Albuterol (COMBIVENT) 20-100 MCG/ACT AERS respimat Inhale 1 puff into the lungs every 6 (six) hours.   . isosorbide mononitrate (IMDUR) 60 MG 24 hr tablet TAKE 1 TABLET BY MOUTH EVERY DAY   . losartan-hydrochlorothiazide (HYZAAR) 100-12.5 MG tablet Take 1 tablet by mouth daily.   . metFORMIN (GLUCOPHAGE) 500 MG tablet Take 1 tablet (500 mg total) by mouth 2 (two) times daily with a meal.   . metoprolol (TOPROL-XL) 200 MG 24 hr tablet TAKE 0.5 TABLETS (100 MG TOTAL) BY MOUTH 2 (TWO) TIMES DAILY.   . nitroGLYCERIN (NITROSTAT) 0.4 MG SL tablet Place 0.4 mg under the tongue every 5 (five) minutes as needed for chest pain.   [name] RANEXA 1000 MG SR tablet TAKE 1 TABLET (1,000 MG TOTAL) BY MOUTH 2 (TWO) TIMES DAILY.   [name] tiotropium (SPIRIVA) 18 MCG inhalation capsule Place 18 mcg into inhaler and inhale daily.      No facility-administered encounter medications on file as of 03/03/2018.     Surgical  History: Past Surgical History:  Procedure Laterality Date  . Arterial evalation      no evidence of ICA stenosis  . CARDIAC CATHETERIZATION    . CARDIAC SURGERY    . CORONARY ARTERY BYPASS GRAFT  2006   Post CABG surgery with a LIMA to the LAD, a vein to the diagonal ,,a vein to the the obtuse marginal and vein to the PDA.  [name] CORONARY STENT PLACEMENT  2010  . GRAFT(S) ANGIOGRAM  10/21/2011   Procedure: GRAFT(S) Cyril Loosen;  Surgeon: Leonie Man, MD;  Location: [facility] CATH LAB;   Service: Cardiovascular;;  . LEFT HEART CATHETERIZATION WITH CORONARY ANGIOGRAM N/A 10/21/2011   Procedure: LEFT HEART CATHETERIZATION WITH CORONARY ANGIOGRAM;  Surgeon: Leonie Man, MD;  Location: [facility] CATH LAB;  Service: Cardiovascular;  Laterality: N/A;    Medical History: Past Medical History:  Diagnosis Date  . Anxiety   . COPD (chronic obstructive pulmonary disease) ([place])   . Coronary artery disease 2006   Acute coronary syndrome in March 2006.   [name] Dyslipidemia   . Hypertension 10/09/2010   EF 40-45%    Family History: Family History  Problem Relation Age of Onset  . Hypertension Mother   . Heart attack Father 47    Social History: Social History   Socioeconomic History  . Marital status: Married    Spouse name: Not on file  . Number of children: Not on file  . Years of education: Not on file  . Highest education level: Not on file  Occupational History  . Not on file  Social Needs  . Financial resource strain: Not on file  . Food insecurity:    Worry: Not on file    Inability: Not on file  . Transportation needs:    Medical: Not on file    Non-medical: Not on file  Tobacco Use  . Smoking status: Current Every Day Smoker    Packs/day: 2.00    Years: 50.00    Pack years: 100.00    Types: Cigarettes  . Smokeless tobacco: Former Systems developer    Types: [place] date: 01/23/2013  Substance and Sexual Activity  . Alcohol use: No  . Drug use: Not on file  . Sexual activity: Not on file  Lifestyle  . Physical activity:    Days per week: Not on file    Minutes per session: Not on file  . Stress: Not on file  Relationships  . Social connections:    Talks on phone: Not on file    Gets together: Not on file    Attends religious service: Not on file    Active member of club or organization: Not on file    Attends meetings of clubs or organizations: Not on file    Relationship status: Not on file  . Intimate partner violence:    Fear of current or ex partner:  Not on file    Emotionally abused: Not on file    Physically abused: Not on file    Forced sexual activity: Not on file  Other Topics Concern  . Not on file  Social History Narrative  . Not on file    Vital Signs: Blood pressure 116/62, pulse 83, resp. rate 16, height 5\' 8"  (1.727 m), weight 193 lb 6.4 oz (87.7 kg), SpO2 93 %.  Examination: General Appearance: The patient is well-developed, well-nourished, and in no distress. Skin: Gross inspection of skin unremarkable. Head: normocephalic, no gross deformities. Eyes: no gross  deformities noted. ENT: ears appear grossly normal no exudates. Neck: Supple. No thyromegaly. No LAD. Respiratory: few rhonchi noted. Cardiovascular: Normal S1 and S2 without murmur or rub. Extremities: No cyanosis. pulses are equal. Neurologic: Alert and oriented. No involuntary movements.  LABS: No results found for this or any previous visit (from the past 2160 hour(s)).  Radiology: Dg Chest 2 View  Result Date: 12/27/2016 CLINICAL DATA:  Shortness of breath.  Smoker. EXAM: CHEST  2 VIEW COMPARISON:  10/24/2016. FINDINGS: The cardiac silhouette remains near the upper limit of normal in size. Stable post CABG changes. Small linear scar in the left mid lung zone without significant change. Otherwise, clear lungs. Diffuse osteopenia. Aortic arch calcifications IMPRESSION: No acute abnormality.  Aortic atherosclerosis. Electronically Signed   By: Claudie Revering M.D.   On: 12/27/2016 15:17    No results found.  No results found.    Assessment and Plan: Patient Active Problem List   Diagnosis Date Noted  . Obesity (BMI 30.0-34.9) 09/05/2014  . Smoking 10/21/2011  . Unstable angina ([place]) 10/18/2011  . S/P CABG x 4, 2006.PCI 11/10, cath this adm- Med Rx 10/18/2011  . HTN (hypertension) 10/18/2011  . Dyslipidemia 10/18/2011  . COPD (chronic obstructive pulmonary disease) ([place]) 10/18/2011  . Cardiomyopathy, ischemic, EF 40-45% 2D 11/10 10/18/2011     1. OSA on CPAP  Continue with current pressure as ordered.  Patient also will need to continue with oxygen.  We are going to set up for a overnight oximetry study without oxygen on CPAP this meeting shall serve as a face-to-face evaluation for ongoing need of oxygen. 2. COPD  Patient has to get follow-up pulmonary function which will be scheduled.  Once again smoking cessation counseling was provided to the patient.  He however states that after his brother diet he started smoking again. 3. Smoker  Strongly recommended to stop smoking at this time. 4. CHF systolic  He follows with Cardiology in [place] had recent intervention done last ejection fraction was 40-45% 5. SOB secondary to cardiac as well as pulmonary causes improving CHF and COPD.  Once again smoking cessation strongly recommended  General Counseling: I have discussed the findings of the evaluation and examination with Jeneen Rinks.  I have also discussed any further diagnostic evaluation thatmay be needed or ordered today. Jigar verbalizes understanding of the findings of todays visit. We also reviewed his medications today and discussed drug interactions and side effects including but not limited excessive drowsiness and altered mental states. We also discussed that there is always a risk not just to him but also people around him. he has been encouraged to call the office with any questions or concerns that should arise related to todays visit.    Time spent: 64min  I have personally obtained a history, examined the patient, evaluated laboratory and imaging results, formulated the assessment and plan and placed orders.    Allyne Gee, MD [facility] Pulmonary and Critical Care Sleep medicine

## 2018-03-03 NOTE — Patient Instructions (Signed)

## 2018-03-04 ENCOUNTER — Telehealth: Payer: Self-pay | Admitting: Internal Medicine

## 2018-03-04 NOTE — Telephone Encounter (Signed)
Gave [facility] Overnight Oximetry order for POX. Beth

## 2018-03-13 ENCOUNTER — Telehealth: Payer: Self-pay | Admitting: Internal Medicine

## 2018-03-13 NOTE — Telephone Encounter (Signed)
Received overnight ox results and will have DSK review/Beth

## 2018-04-01 NOTE — Progress Notes (Signed)
Scanned in overnight pulse oximetry started on 03/11/18 at 12:08 am.

## 2018-04-19 ENCOUNTER — Other Ambulatory Visit: Payer: Self-pay | Admitting: Cardiovascular Disease

## 2018-06-13 ENCOUNTER — Other Ambulatory Visit: Payer: Self-pay | Admitting: Cardiovascular Disease

## 2018-06-18 DIAGNOSIS — C4490 Unspecified malignant neoplasm of skin, unspecified: Secondary | ICD-10-CM | POA: Insufficient documentation

## 2018-06-18 DIAGNOSIS — F17210 Nicotine dependence, cigarettes, uncomplicated: Secondary | ICD-10-CM | POA: Insufficient documentation

## 2018-06-22 ENCOUNTER — Telehealth: Payer: Self-pay | Admitting: Cardiovascular Disease

## 2018-06-22 NOTE — Telephone Encounter (Signed)
F/u Message         [facility] Pre-operative Risk Assessment    Request for surgical clearance:  1. What type of surgery is being performed? Ear/ lip and/ neck  2. When is this surgery scheduled? Pending   3. What type of clearance is required (medical clearance vs. Pharmacy clearance to hold med vs. Both)? Both  4. Are there any medications that need to be held prior to surgery and how long? Plavix   5. Practice name and name of physician performing surgery? [facility]  6. What is your office phone number [phone]   7.   What is your office fax number [phone]  8.   Anesthesia type (None, local, MAC, general) ? General  Troy [name]  From [facility] returned your call   Troy [name] 06/22/2018, 3:38 PM  _________________________________________________________________   (provider comments below)

## 2018-06-23 NOTE — Telephone Encounter (Signed)
Dr [name] is it OK to hold Plavix on Mr Feazell?  Kerin Ransom PA-C 06/23/2018 4:10 PM

## 2018-06-24 NOTE — Telephone Encounter (Signed)
   Primary Cardiologist: [name], MD  Chart reviewed and patient contacted by phone today as part of pre-operative protocol coverage. Given past medical history and time since last visit, based on ACC/AHA guidelines, Latavius Capizzi Frasco would be at acceptable risk for the planned procedure without further cardiovascular testing.   Ok to hold Plavix pre op if needed.   I will route this recommendation to the requesting party via Epic fax function and remove from pre-op pool.  Please call with questions.  Kerin Ransom, PA-C 06/24/2018, 2:19 PM

## 2018-06-24 NOTE — Telephone Encounter (Signed)
He can hold Plavix for his surgery.  Peter Martinique MD, [facility]

## 2018-07-02 ENCOUNTER — Other Ambulatory Visit (HOSPITAL_COMMUNITY): Payer: Self-pay | Admitting: Otolaryngology

## 2018-07-02 DIAGNOSIS — C4321 Malignant melanoma of right ear and external auricular canal: Secondary | ICD-10-CM

## 2018-07-02 NOTE — Telephone Encounter (Signed)
Received fax from [facility] requesting further direction on Plavix.   Requesting to know how many days prior he should hold and how many days after he should restart.   Routed to MD to review and advise

## 2018-07-02 NOTE — Telephone Encounter (Signed)
Hold Plavix x 5 days. Resume as soon as safe to do so from a surgical standpoint.

## 2018-07-03 NOTE — Telephone Encounter (Signed)
agree

## 2018-07-06 NOTE — Telephone Encounter (Signed)
° °  Please fax updated clearance letter to [facility], Barnie Del, fax # [phone]. Original preop letter did not include specific timeframe to hold.

## 2018-07-06 NOTE — Telephone Encounter (Signed)
   Primary Cardiologist:Thomas [name], MD  Chart reviewed as part of pre-operative protocol coverage. Pre-op clearance already addressed by colleagues in earlier phone notes. To summarize recommendations:  - Per Kerin Ransom, PA-C " Given past medical history and time since last visit, based on ACC/AHA guidelines, Troy [name] would be at acceptable risk for the planned procedure without further cardiovascular testing. "  - Per Dr. Kelly/Brittainy simmons PA-C, "Hold Plavix x 5 days. Resume as soon as safe to do so from a surgical standpoint. "   Will route this bundled recommendation to requesting provider via Epic fax function. Please call with questions.  Charlie Pitter, PA-C 07/06/2018, 3:10 PM

## 2018-07-08 ENCOUNTER — Other Ambulatory Visit: Payer: Self-pay | Admitting: Cardiovascular Disease

## 2018-07-14 ENCOUNTER — Other Ambulatory Visit: Payer: Self-pay | Admitting: Cardiovascular Disease

## 2018-07-15 ENCOUNTER — Ambulatory Visit (HOSPITAL_COMMUNITY)
Admission: RE | Admit: 2018-07-15 | Discharge: 2018-07-15 | Disposition: A | Discharge status: Home / Self Care | Payer: Medicare Other | Source: Ambulatory Visit | Attending: Otolaryngology | Admitting: Otolaryngology

## 2018-07-15 ENCOUNTER — Encounter (HOSPITAL_COMMUNITY): Payer: Self-pay

## 2018-07-16 ENCOUNTER — Ambulatory Visit: Payer: Self-pay | Admitting: Internal Medicine

## 2018-07-23 ENCOUNTER — Other Ambulatory Visit (HOSPITAL_COMMUNITY): Payer: Medicare Other

## 2018-08-07 ENCOUNTER — Inpatient Hospital Stay (HOSPITAL_COMMUNITY): Admission: RE | Admit: 2018-08-07 | Payer: Medicare Other | Source: Ambulatory Visit | Admitting: Otolaryngology

## 2018-08-07 ENCOUNTER — Encounter (HOSPITAL_COMMUNITY): Admission: RE | Payer: Self-pay | Source: Ambulatory Visit

## 2018-08-07 SURGERY — DISSECTION, NECK, RADICAL
Anesthesia: General | Laterality: Right

## 2018-09-01 ENCOUNTER — Ambulatory Visit: Payer: Self-pay | Admitting: Internal Medicine

## 2018-09-01 DIAGNOSIS — C4321 Malignant melanoma of right ear and external auricular canal: Secondary | ICD-10-CM | POA: Insufficient documentation

## 2018-09-01 DIAGNOSIS — C001 Malignant neoplasm of external lower lip: Secondary | ICD-10-CM | POA: Insufficient documentation

## 2018-09-07 ENCOUNTER — Other Ambulatory Visit: Payer: Self-pay | Admitting: Otolaryngology

## 2018-09-07 DIAGNOSIS — C4321 Malignant melanoma of right ear and external auricular canal: Secondary | ICD-10-CM

## 2018-09-08 ENCOUNTER — Encounter: Payer: Self-pay | Admitting: Internal Medicine

## 2018-09-08 ENCOUNTER — Ambulatory Visit (INDEPENDENT_AMBULATORY_CARE_PROVIDER_SITE_OTHER): Payer: Medicare Other | Admitting: Internal Medicine

## 2018-09-08 VITALS — BP 130/80 | HR 72 | Resp 16 | Ht 68.0 in | Wt 185.0 lb

## 2018-09-08 DIAGNOSIS — J439 Emphysema, unspecified: Secondary | ICD-10-CM

## 2018-09-08 DIAGNOSIS — R0602 Shortness of breath: Secondary | ICD-10-CM | POA: Diagnosis not present

## 2018-09-08 DIAGNOSIS — G4733 Obstructive sleep apnea (adult) (pediatric): Secondary | ICD-10-CM | POA: Diagnosis not present

## 2018-09-08 DIAGNOSIS — F17219 Nicotine dependence, cigarettes, with unspecified nicotine-induced disorders: Secondary | ICD-10-CM

## 2018-09-08 DIAGNOSIS — Z9989 Dependence on other enabling machines and devices: Secondary | ICD-10-CM

## 2018-09-08 DIAGNOSIS — I255 Ischemic cardiomyopathy: Secondary | ICD-10-CM

## 2018-09-08 NOTE — Progress Notes (Signed)
[facility] [place], [place] [address]  Pulmonary Sleep Medicine   Office Visit Note  Patient Name: Troy [name] DOB: 1951-01-17 MRN [id number]  Date of Service: 09/08/2018  Complaints/HPI: Pt is here for follow up, and for surgical clearance. 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  He is here today to get surgical clearance.  Patient has a history of COPD and smoking.  Has no cough no congestion noted at this time.  Occasionally gets shortness of breath.  Denies having any chest pain no palpitations are noted.  ROS  General: (-) fever, (-) chills, (-) night sweats, (-) weakness Skin: (-) rashes, (-) itching,. Eyes: (-) visual changes, (-) redness, (-) itching. Nose and Sinuses: (-) nasal stuffiness or itchiness, (-) postnasal drip, (-) nosebleeds, (-) sinus trouble. Mouth and Throat: (-) sore throat, (-) hoarseness. Neck: (-) swollen glands, (-) enlarged thyroid, (-) neck pain. Respiratory: - cough, (-) bloody sputum, - shortness of breath, - wheezing. Cardiovascular: - ankle swelling, (-) chest pain. Lymphatic: (-) lymph node enlargement. Neurologic: (-) numbness, (-) tingling. Psychiatric: (-) anxiety, (-) depression   Current Medication: Outpatient Encounter Medications as of 09/08/2018  Medication Sig  . ADVAIR DISKUS 500-50 MCG/DOSE AEPB Inhale 1 puff into the lungs 2 (two) times daily as needed.  . ALPRAZolam (XANAX) 0.25 MG tablet Take 0.25 mg by mouth at bedtime as needed for sleep.  [name] amLODipine (NORVASC) 10 MG tablet Take 10 mg by mouth daily.  [name] atorvastatin (LIPITOR) 40 MG tablet TAKE 1 TABLET DAILY AT BEDTIME.  . busPIRone (BUSPAR) 7.5 MG tablet Take 1 tablet by mouth 2 (two) times daily.  . clopidogrel (PLAVIX) 75 MG tablet Take 1 tablet (75 mg total) by mouth daily.  . famotidine (PEPCID) 20 MG tablet TAKE 1 TABLET BY MOUTH DAILY.  [name] Fluticasone-Salmeterol (ADVAIR) 100-50 MCG/DOSE AEPB  Inhale 1 puff into the lungs every 12 (twelve) hours.    . INCRUSE ELLIPTA 62.5 MCG/INH AEPB INHALE 1 PUFF INTO THE LUNGS DAILY  . Ipratropium-Albuterol (COMBIVENT) 20-100 MCG/ACT AERS respimat Inhale 1 puff into the lungs every 6 (six) hours.  . isosorbide mononitrate (IMDUR) 60 MG 24 hr tablet TAKE 1 TABLET BY MOUTH EVERY DAY  . losartan-hydrochlorothiazide (HYZAAR) 100-12.5 MG tablet Take 1 tablet by mouth daily.  . metFORMIN (GLUCOPHAGE) 500 MG tablet TAKE 1 TABLET (500 MG TOTAL) BY MOUTH 2 (TWO) TIMES DAILY WITH A MEAL.  . metoprolol (TOPROL-XL) 200 MG 24 hr tablet TAKE 0.5 TABLETS (100 MG TOTAL) BY MOUTH 2 (TWO) TIMES DAILY.  . nitroGLYCERIN (NITROSTAT) 0.4 MG SL tablet Place 0.4 mg under the tongue every 5 (five) minutes as needed for chest pain.  . ranolazine (RANEXA) 1000 MG SR tablet TAKE 1 TABLET (1,000 MG TOTAL) BY MOUTH 2 (TWO) TIMES DAILY.  [name] tiotropium (SPIRIVA) 18 MCG inhalation capsule Place 18 mcg into inhaler and inhale daily.    [name] ezetimibe (ZETIA) 10 MG tablet Take 1 tablet (10 mg total) by mouth daily.   No facility-administered encounter medications on file as of 09/08/2018.     Surgical History: Past Surgical History:  Procedure Laterality Date  . Arterial evalation      no evidence of ICA stenosis  . CARDIAC CATHETERIZATION    . CARDIAC SURGERY    . CORONARY ARTERY BYPASS GRAFT  2006   Post CABG surgery with a LIMA to the LAD, a vein to the diagonal ,,a vein to  the the obtuse marginal and vein to the PDA.  [name] CORONARY STENT PLACEMENT  2010  . GRAFT(S) ANGIOGRAM  10/21/2011   Procedure: GRAFT(S) Cyril Loosen;  Surgeon: Leonie Man, MD;  Location: [facility] CATH LAB;  Service: Cardiovascular;;  . LEFT HEART CATHETERIZATION WITH CORONARY ANGIOGRAM N/A 10/21/2011   Procedure: LEFT HEART CATHETERIZATION WITH CORONARY ANGIOGRAM;  Surgeon: Leonie Man, MD;  Location: [facility] CATH LAB;  Service: Cardiovascular;  Laterality: N/A;    Medical History: Past Medical History:   Diagnosis Date  . Anxiety   . COPD (chronic obstructive pulmonary disease) ([place])   . Coronary artery disease 2006   Acute coronary syndrome in March 2006.   [name] Dyslipidemia   . Hypertension 10/09/2010   EF 40-45%    Family History: Family History  Problem Relation Age of Onset  . Hypertension Mother   . Heart attack Father 44    Social History: Social History   Socioeconomic History  . Marital status: Married    Spouse name: Not on file  . Number of children: Not on file  . Years of education: Not on file  . Highest education level: Not on file  Occupational History  . Not on file  Social Needs  . Financial resource strain: Not on file  . Food insecurity:    Worry: Not on file    Inability: Not on file  . Transportation needs:    Medical: Not on file    Non-medical: Not on file  Tobacco Use  . Smoking status: Current Every Day Smoker    Packs/day: 2.00    Years: 50.00    Pack years: 100.00    Types: Cigarettes  . Smokeless tobacco: Former Systems developer    Types: [place] date: 01/23/2013  Substance and Sexual Activity  . Alcohol use: No  . Drug use: Not on file  . Sexual activity: Not on file  Lifestyle  . Physical activity:    Days per week: Not on file    Minutes per session: Not on file  . Stress: Not on file  Relationships  . Social connections:    Talks on phone: Not on file    Gets together: Not on file    Attends religious service: Not on file    Active member of club or organization: Not on file    Attends meetings of clubs or organizations: Not on file    Relationship status: Not on file  . Intimate partner violence:    Fear of current or ex partner: Not on file    Emotionally abused: Not on file    Physically abused: Not on file    Forced sexual activity: Not on file  Other Topics Concern  . Not on file  Social History Narrative  . Not on file    Vital Signs: Blood pressure 130/80, pulse 72, resp. rate 16, height 5\' 8"  (1.727 m), weight  185 lb (83.9 kg), SpO2 96 %.  Examination: General Appearance: The patient is well-developed, well-nourished, and in no distress. Skin: Gross inspection of skin unremarkable. Head: normocephalic, no gross deformities. Eyes: no gross deformities noted. ENT: ears appear grossly normal no exudates. Neck: Supple. No thyromegaly. No LAD. Respiratory: clear to auscultation. Cardiovascular: Normal S1 and S2 without murmur or rub. Extremities: No cyanosis. pulses are equal. Neurologic: Alert and oriented. No involuntary movements.  LABS: No results found for this or any previous visit (from the past 2160 hour(s)).  Radiology: No results found.  No results found.  No results found.    Assessment and Plan: Patient Active Problem List   Diagnosis Date Noted  . Obesity (BMI 30.0-34.9) 09/05/2014  . Smoking 10/21/2011  . Unstable angina ([place]) 10/18/2011  . S/P CABG x 4, 2006.PCI 11/10, cath this adm- Med Rx 10/18/2011  . HTN (hypertension) 10/18/2011  . Dyslipidemia 10/18/2011  . COPD (chronic obstructive pulmonary disease) ([place]) 10/18/2011  . Cardiomyopathy, ischemic, EF 40-45% 2D 11/10 10/18/2011   1. Pulmonary emphysema, unspecified emphysema type ([place]) Patient COPD is stable at this time.  His most recent FEV1 51% therefore the patient is cleared for surgical excision of melanoma on his right ear as well as sentinel node biopsy.  His risk from intraoperative and postoperative complications is moderately increased smoking cessation is strongly recommended at this time  2. OSA on CPAP Encourage patient to continue using CPAP machine when sleeping.  Again risk of complications postoperatively would be increased in noncompliance with sleep apnea  3. Cigarette nicotine dependence with nicotine-induced disorder Unfortunately patient continues to smoke.  Once again discussed smoking cessation with patient he is not interested in quitting at this time. Smoking cessation  counseling: 1. Pt acknowledges the risks of long term smoking, she will try to quite smoking. 2. Options for different medications including nicotine products, chewing gum, patch etc, Wellbutrin and Chantix is discussed 3. Goal and date of compete cessation is discussed 4. Total time spent in smoking cessation is 15 min.  4. Shortness of breath FVC on spirometry 64% pre-predicted value. FEV1 on spirometry 51% of pre-predicted value. FEV1/FVC is 79% of the pre-predicted value. - Spirometry with Graph   General Counseling: I have discussed the findings of the evaluation and examination with Jeneen Rinks.  I have also discussed any further diagnostic evaluation thatmay be needed or ordered today. Domanic verbalizes understanding of the findings of todays visit. We also reviewed his medications today and discussed drug interactions and side effects including but not limited excessive drowsiness and altered mental states. We also discussed that there is always a risk not just to him but also people around him. he has been encouraged to call the office with any questions or concerns that should arise related to todays visit.    Time spent: 19min This patient was seen by Orson Gear AGNP-C in Collaboration with Dr. Devona Konig as a part of collaborative care agreement.   I have personally obtained a history, examined the patient, evaluated laboratory and imaging results, formulated the assessment and plan and placed orders.    Allyne Gee, MD [facility] Pulmonary and Critical Care Sleep medicine

## 2018-09-11 ENCOUNTER — Other Ambulatory Visit: Payer: Self-pay | Admitting: Cardiovascular Disease

## 2018-09-13 DIAGNOSIS — D0321 Melanoma in situ of right ear and external auricular canal: Secondary | ICD-10-CM

## 2018-09-13 HISTORY — DX: Melanoma in situ of right ear and external auricular canal: D03.21

## 2018-09-14 ENCOUNTER — Ambulatory Visit
Admission: RE | Admit: 2018-09-14 | Discharge: 2018-09-14 | Disposition: A | Discharge status: Home / Self Care | Payer: Medicare Other | Source: Ambulatory Visit | Attending: Otolaryngology | Admitting: Otolaryngology

## 2018-09-14 DIAGNOSIS — C4321 Malignant melanoma of right ear and external auricular canal: Secondary | ICD-10-CM | POA: Insufficient documentation

## 2018-09-14 MED ORDER — TECHNETIUM TC 99M SULFUR COLLOID FILTERED
0.3780 | Freq: Once | INTRAVENOUS | Status: AC | PRN
  Administered 2018-09-14: 0.378 via INTRADERMAL

## 2018-09-19 ENCOUNTER — Other Ambulatory Visit: Payer: Self-pay | Admitting: Cardiovascular Disease

## 2018-09-21 NOTE — Telephone Encounter (Signed)
Rx(s) sent to pharmacy electronically.  

## 2018-09-23 NOTE — H&P (Signed)
HPI:   Troy [name] is a 67 y.o. male who presents as a consult Patient.   Referring Provider: [facility]*  Chief complaint: Skin cancer.  HPI: History of multiple skin cancers, including the lower lip. He has had a growth on his right external ear for about a year. Coincidentally, he recently got hit in that ear by a chain a few days ago and has an injury adjacent to the mass. 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 I do not have any records of that. We are working on that. He also has a growth on his right neck that appears to be another skin cancer. He has a history of severe COPD. He smokes 2 packs/day. He has had bypass surgery and has ongoing cardiac issues. 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  PMH/Meds/All/SocHx/FamHx/ROS:   Past Medical History:  Diagnosis Date  . Heart disease  . History of squamous cell carcinoma  . Hypertension   Past Surgical History:  Procedure Laterality Date  . CARDIAC SURGERY  . stent surgery   No family history of bleeding disorders, wound healing problems or difficulty with anesthesia.   Social History   Social History  . Marital status: Married  Spouse name: N/A  . Number of children: N/A  . Years of education: N/A   Occupational History  . Not on file.   Social History Main Topics  . Smoking status: Current Every Day Smoker  . Smokeless tobacco: Never Used  . Alcohol use Not on file  . Drug use: Unknown  . Sexual activity: Not on file   Other Topics Concern  . Not on file   Social History Narrative  . No narrative on file   Current Outpatient Prescriptions:  . ALPRAZolam (XANAX) 0.25 MG tablet, Take 0.25 mg by mouth., Disp: , Rfl:  . amLODIPine (NORVASC) 10 MG tablet, Take 10 mg by mouth., Disp: , Rfl:  . aspirin 81 MG EC tablet *ANTIPLATELET*, Take by mouth daily., Disp: , Rfl:  . atorvastatin (LIPITOR) 40 MG tablet, TAKE 1  TABLET DAILY AT BEDTIME., Disp: , Rfl:  . busPIRone (BUSPAR) 7.5 MG tablet, Take by mouth., Disp: , Rfl:  . clopidogrel (PLAVIX) 75 mg tablet *ANTIPLATELET*, Take 75 mg by mouth., Disp: , Rfl:  . famotidine (PEPCID) 20 MG tablet, TAKE 1 TABLET BY MOUTH DAILY., Disp: , Rfl:  . fluticasone propionate-salmeterol (ADVAIR DISKUS) 500-50 mcg/dose DsDv diskus inhaler, Inhale into the lungs., Disp: , Rfl:  . furosemide (LASIX) 40 MG tablet, Take 40 mg by mouth 2 times daily., Disp: , Rfl:  . isosorbide mononitrate ER (IMDUR ER) 60 MG 24 hr tablet, TAKE 1 TABLET BY MOUTH EVERY DAY, Disp: , Rfl:  . losartan-hydrochlorothiazide (HYZAAR) 100-12.5 mg per tablet, Take 1 tablet by mouth daily., Disp: , Rfl:  . metFORMIN (GLUCOPHAGE) 500 MG tablet, Take 500 mg by mouth., Disp: , Rfl:  . metoprolol (TOPROL-XL) 200 MG 24 hr tablet, Take 100 mg by mouth., Disp: , Rfl:  . nitroglycerin (NITROSTAT) 0.4 MG SL tablet, Place 0.4 mg under the tongue., Disp: , Rfl:  . POTASSIUM ACETATE MISC, by Misc.(Non-Drug; Combo Route) route., Disp: , Rfl:  . ranolazine (RANEXA) 1,000 mg SR tablet, TAKE 1 TABLET (1,000 MG TOTAL) BY MOUTH 2 (TWO) TIMES DAILY., Disp: , Rfl:  . tramadol HCl (ULTRAM ORAL), Take by mouth., Disp: , Rfl:   A  complete ROS was performed with pertinent positives/negatives noted in the HPI. The remainder of the ROS are negative.   Physical Exam:   Ht 1.727 m (5\' 8" )  Wt 82.6 kg (182 lb)  BMI 27.67 kg/m   General: He is in no acute distress. He appears to have severe sun damage throughout all of his skin. He has a chesty cough throughout the evaluation. Head: Right temporal scalp area with superficial skin injury from his recent accident. Eyes: Pupils are equal, and reactive to light. Vision is grossly intact. No spontaneous or gaze nystagmus. Ears: 2 cm raised neoplasm in the right inferior aspect of the helix/antihelix. Right ear canal appears to be closed off but there is no evidence of infection. Left  side with cerumen buildup. Hearing: Grossly normal. Nose: Nasal cavities are clear with healthy mucosa, no polyps or exudate. Airways are patent. Face: No masses or scars, facial nerve function is symmetric. Oral Cavity: No mucosal abnormalities are noted. Tongue with normal mobility. There are multiple lesions along the lower lip including the right oral commissure and almost the entire lower lip vermilion. There is minimal extension into the buccal mucosa towards the oral commissure but not in the remainder of the lip. There is no palpable mass externally Oropharynx: There are no mucosal masses identified. Tongue base appears normal and healthy. Larynx/Hypopharynx: deferred Chest: Deferred Neck: There is a small dark skin lesion in the midportion of the lip on the right, no cervical adenopathy, no thyroid nodules or enlargement. Neuro: Cranial nerves II-XII with normal function. Balance: Normal gate. Other findings: none.  Independent Review of Additional Tests or Records:   Procedures:  none  Impression & Plans:  Multiple skin cancers. The ear lesion in the neck lesion can be removed easily with primary closure. The lip lesion is going to require extensive reconstruction. The extent of the surgery will depend on frozen section analysis to be sure where the cancer is and where it is not. Before we do anything we will need to discuss this with his cardiologist. I will also need to have him be evaluated by a pulmonologist here in town to see if he can be safely placed under general anesthesia. Strongly urged him to consider at least cutting down on smoking if not quitting.

## 2018-09-28 NOTE — Pre-Procedure Instructions (Signed)
Troy [name]  09/28/2018      [id number] - [place], [place] [address] [place] [address] Phone: [phone] Fax: [phone]    Your procedure is scheduled on December 23  Report to [place] at 0730 A.M.  Call this number if you have problems the morning of surgery:  [phone]   Remember:  Do not eat or drink after midnight.      Take these medicines the morning of surgery with A SIP OF WATER  ADVAIR DISKUS amLODipine (NORVASC)  busPIRone (BUSPAR)  famotidine (PEPCID) INCRUSE ELLIPTA Ipratropium-Albuterol (COMBIVENT) isosorbide mononitrate (IMDUR)  metoprolol (TOPROL-XL)  tiotropium (SPIRIVA FOLLOW PHYSICIANS INSTRUCTIONS ABOUT PLAVIX  7 days prior to surgery STOP taking any Aspirin(unless otherwise instructed by your surgeon), Aleve, Naproxen, Ibuprofen, Motrin, Advil, Goody's, BC's, all herbal medications, fish oil, and all vitamins   WHAT DO I DO ABOUT MY DIABETES MEDICATION?   [name] Do not take oral diabetes medicines (pills) the morning of surgery. metFORMIN (GLUCOPHAGE)   How to Manage Your Diabetes Before and After Surgery  Why is it important to control my blood sugar before and after surgery? . Improving blood sugar levels before and after surgery helps healing and can limit problems. . A way of improving blood sugar control is eating a healthy diet by: o  Eating less sugar and carbohydrates o  Increasing activity/exercise o  Talking with your doctor about reaching your blood sugar goals . High blood sugars (greater than 180 mg/dL) can raise your risk of infections and slow your recovery, so you will need to focus on controlling your diabetes during the weeks before surgery. . Make sure that the doctor who takes care of your diabetes knows about your planned surgery including the date and location.  How do I manage my blood sugar before surgery? . Check your blood sugar at least 4 times  a day, starting 2 days before surgery, to make sure that the level is not too high or low. o Check your blood sugar the morning of your surgery when you wake up and every 2 hours until you get to the Short Stay unit. . If your blood sugar is less than 70 mg/dL, you will need to treat for low blood sugar: o Do not take insulin. o Treat a low blood sugar (less than 70 mg/dL) with  cup of clear juice (cranberry or apple), 4 glucose tablets, OR glucose gel. o Recheck blood sugar in 15 minutes after treatment (to make sure it is greater than 70 mg/dL). If your blood sugar is not greater than 70 mg/dL on recheck, call [phone] for further instructions. . Report your blood sugar to the short stay nurse when you get to Short Stay.  . If you are admitted to the hospital after surgery: o Your blood sugar will be checked by the staff and you will probably be given insulin after surgery (instead of oral diabetes medicines) to make sure you have good blood sugar levels. o The goal for blood sugar control after surgery is 80-180 mg/dL.     Do not wear jewelry  Do not wear lotions, powders, or cologne, or deodorant.  Men may shave face and neck.  Do not bring valuables to the hospital.  [facility] is not responsible for any belongings or valuables.  Contacts, dentures or bridgework may not be worn into surgery.  Leave your suitcase in the car.  After surgery it may be  brought to your room.  For patients admitted to the hospital, discharge time will be determined by your treatment team.  Patients discharged the day of surgery will not be allowed to drive home.    Special instructions:   [facility]- Preparing For Surgery  Before surgery, you can play an important role. Because skin is not sterile, your skin needs to be as free of germs as possible. You can reduce the number of germs on your skin by washing with CHG (chlorahexidine gluconate) Soap before surgery.  CHG is an antiseptic cleaner which  kills germs and bonds with the skin to continue killing germs even after washing.    Oral Hygiene is also important to reduce your risk of infection.  Remember - BRUSH YOUR TEETH THE MORNING OF SURGERY WITH YOUR REGULAR TOOTHPASTE  Please do not use if you have an allergy to CHG or antibacterial soaps. If your skin becomes reddened/irritated stop using the CHG.  Do not shave (including legs and underarms) for at least 48 hours prior to first CHG shower. It is OK to shave your face.  Please follow these instructions carefully.   1. Shower the NIGHT BEFORE SURGERY and the MORNING OF SURGERY with CHG.   2. If you chose to wash your hair, wash your hair first as usual with your normal shampoo.  3. After you shampoo, rinse your hair and body thoroughly to remove the shampoo.  4. Use CHG as you would any other liquid soap. You can apply CHG directly to the skin and wash gently with a scrungie or a clean washcloth.   5. Apply the CHG Soap to your body ONLY FROM THE NECK DOWN.  Do not use on open wounds or open sores. Avoid contact with your eyes, ears, mouth and genitals (private parts). Wash Face and genitals (private parts)  with your normal soap.  6. Wash thoroughly, paying special attention to the area where your surgery will be performed.  7. Thoroughly rinse your body with warm water from the neck down.  8. DO NOT shower/wash with your normal soap after using and rinsing off the CHG Soap.  9. Pat yourself dry with a CLEAN TOWEL.  10. Wear CLEAN PAJAMAS to bed the night before surgery, wear comfortable clothes the morning of surgery  11. Place CLEAN SHEETS on your bed the night of your first shower and DO NOT SLEEP WITH PETS.    Day of Surgery:  Do not apply any deodorants/lotions.  Please wear clean clothes to the hospital/surgery center.   Remember to brush your teeth WITH YOUR REGULAR TOOTHPASTE.    Please read over the following fact sheets that you were  given.

## 2018-09-29 ENCOUNTER — Encounter (HOSPITAL_COMMUNITY)
Admission: RE | Admit: 2018-09-29 | Discharge: 2018-09-29 | Disposition: A | Discharge status: Home / Self Care | Payer: Medicare Other | Source: Ambulatory Visit | Attending: Otolaryngology | Admitting: Otolaryngology

## 2018-09-29 ENCOUNTER — Other Ambulatory Visit: Payer: Self-pay | Admitting: Cardiovascular Disease

## 2018-09-29 ENCOUNTER — Other Ambulatory Visit: Payer: Self-pay

## 2018-09-29 ENCOUNTER — Encounter (HOSPITAL_COMMUNITY): Payer: Self-pay

## 2018-09-29 DIAGNOSIS — Z7984 Long term (current) use of oral hypoglycemic drugs: Secondary | ICD-10-CM | POA: Diagnosis not present

## 2018-09-29 DIAGNOSIS — Z01812 Encounter for preprocedural laboratory examination: Secondary | ICD-10-CM | POA: Insufficient documentation

## 2018-09-29 DIAGNOSIS — J449 Chronic obstructive pulmonary disease, unspecified: Secondary | ICD-10-CM | POA: Insufficient documentation

## 2018-09-29 DIAGNOSIS — Z79899 Other long term (current) drug therapy: Secondary | ICD-10-CM | POA: Insufficient documentation

## 2018-09-29 DIAGNOSIS — I2510 Atherosclerotic heart disease of native coronary artery without angina pectoris: Secondary | ICD-10-CM | POA: Diagnosis not present

## 2018-09-29 DIAGNOSIS — Z7982 Long term (current) use of aspirin: Secondary | ICD-10-CM | POA: Insufficient documentation

## 2018-09-29 DIAGNOSIS — I10 Essential (primary) hypertension: Secondary | ICD-10-CM | POA: Diagnosis not present

## 2018-09-29 DIAGNOSIS — G4733 Obstructive sleep apnea (adult) (pediatric): Secondary | ICD-10-CM | POA: Diagnosis not present

## 2018-09-29 DIAGNOSIS — Z951 Presence of aortocoronary bypass graft: Secondary | ICD-10-CM | POA: Diagnosis not present

## 2018-09-29 DIAGNOSIS — C439 Malignant melanoma of skin, unspecified: Secondary | ICD-10-CM | POA: Insufficient documentation

## 2018-09-29 DIAGNOSIS — E119 Type 2 diabetes mellitus without complications: Secondary | ICD-10-CM | POA: Insufficient documentation

## 2018-09-29 HISTORY — DX: Type 2 diabetes mellitus without complications: E11.9

## 2018-09-29 HISTORY — DX: Sleep apnea, unspecified: G47.30

## 2018-09-29 LAB — GLUCOSE, CAPILLARY: Glucose-Capillary: 84 mg/dL (ref 70–99)

## 2018-09-29 LAB — BASIC METABOLIC PANEL
Anion gap: 12 (ref 5–15)
BUN: 14 mg/dL (ref 8–23)
CO2: 25 mmol/L (ref 22–32)
Calcium: 9.6 mg/dL (ref 8.9–10.3)
Chloride: 100 mmol/L (ref 98–111)
Creatinine, Ser: 1.26 mg/dL — ABNORMAL HIGH (ref 0.61–1.24)
GFR calc Af Amer: >60 mL/min (ref >60)
GFR calc non Af Amer: 58 mL/min — ABNORMAL LOW (ref >60)
Glucose, Bld: 96 mg/dL (ref 70–99)
Potassium: 4 mmol/L (ref 3.5–5.1)
Sodium: 137 mmol/L (ref 135–145)

## 2018-09-29 LAB — CBC
HCT: 38.7 % — ABNORMAL LOW (ref 39.0–52.0)
HEMOGLOBIN: 12.3 g/dL — AB (ref 13.0–17.0)
MCH: 29 pg (ref 26.0–34.0)
MCHC: 31.8 g/dL (ref 30.0–36.0)
MCV: 91.3 fL (ref 80.0–100.0)
NRBC: 0 % (ref 0.0–0.2)
Platelets: 315 10*3/uL (ref 150–400)
RBC: 4.24 MIL/uL (ref 4.22–5.81)
RDW: 13.2 % (ref 11.5–15.5)
WBC: 11.9 10*3/uL — ABNORMAL HIGH (ref 4.0–10.5)

## 2018-09-29 LAB — HEMOGLOBIN A1C
Hgb A1c MFr Bld: 6.1 % — ABNORMAL HIGH (ref 4.8–5.6)
Mean Plasma Glucose: 128.37 mg/dL

## 2018-09-29 NOTE — Progress Notes (Addendum)
PCP:  Dorisann Frames, MD  Cardiologist: [name], MD  EKG: 12/22/17 in [facility]  Stress test: 2014 in EP[facility]  ECHO: 2011 in EPIC  Cardiac Cath: 2013 in EPIC  Chest x-ray: pt denies past year, no recent respiratory infections/complications  Pt medication list had plavix on it originally.  However, patient brought in medications from home and he reports he is no longer on the medication.  There is a note from September that instructs him to hold plavix for 5 days, but nothing since September.  Will have anesthesia review with Dr. [name] to be sure he is not supposed to be currently on medication. He reports he takes an 81 mg aspirin daily and has no orders to hold this.

## 2018-09-30 NOTE — Progress Notes (Signed)
Anesthesia Chart Review:  Case:  [id number] Date/Time:  10/05/18 0915   Procedures:      sentinel NODE BIOPSY with right partial modified neck dissection (Right )     Right Partial pinnectomy (Right )   Anesthesia type:  General   Pre-op diagnosis:  skin melanoma   Location:  [facility] OR ROOM 09 / [facility]   Surgeon:  Izora Gala, MD      DISCUSSION: 67 yo male current smoker. Pertinent hx includes COPD, HTN, OSA on CPAP, DMII, CAD (S/P CABG x 4, 2006; PCI 11/10; cath 2013 w/ patent stent in vein graft to RCA,  LIMA small and nearly atretic and did not reach the LAD. Vein graft to diagonal was occluded. The vein graft to the marginal vessel was widely patent; nuclear perfusion study 2014 low risk).  Pt follows with Dr. [name] for CAD with hx of CABG x 4 in 2006 and PCI 2010. Nuclear stress test 2014 was Low risk with consistent evidence of a moderate sized distal anterior-apical infarction with very mild peri-infarct ischemia, EF 48%. Pt last seen by Dr. [name] 12/22/2017 and at that time denied any anginal symptoms but did continue to have DOE related to COPD. He was started on metformin for elevated A1c.  Cardiac clearance per telephone encounter 07/06/2018 by Melina Copa, PA-C. Pt was instructed to hold Plavix 5d preop, however at PAT appt pt reported he was not taking Plavix. Unclear if he stopped of his own volition or was advised to stop. Staff message sent to Dr. [name] for clarification.  Follows with pulmonology for OSA and COPD. Preop clearance by Orson Gear, NP 09/08/2018 states: "Patient COPD is stable at this time.  His most recent FEV1 51% therefore the patient is cleared for surgical excision of melanoma on his right ear as well as sentinel node biopsy.  His risk from intraoperative and postoperative complications is moderately increased smoking cessation is strongly recommended at this time"  Anticipate he can proceed as planned barring acute status change.   VS: BP 120/63   Pulse 99    Temp 36.7 C   Resp 18   Ht 5\' 8"  (1.727 m)   Wt 85 kg   SpO2 99%   BMI 28.49 kg/m   PROVIDERS: [name], MD is PCP  [name], MD is Cardiologist  Devona Konig, MD is Pulmonologist  LABS: Labs reviewed: Acceptable for surgery. (all labs ordered are listed, but only abnormal results are displayed)  Labs Reviewed  HEMOGLOBIN A1C - Abnormal; Notable for the following components:      Result Value   Hgb A1c MFr Bld 6.1 (*)    All other components within normal limits  BASIC METABOLIC PANEL - Abnormal; Notable for the following components:   Creatinine, Ser 1.26 (*)    GFR calc non Af Amer 58 (*)    All other components within normal limits  CBC - Abnormal; Notable for the following components:   WBC 11.9 (*)    Hemoglobin 12.3 (*)    HCT 38.7 (*)    All other components within normal limits  GLUCOSE, CAPILLARY     IMAGES: CHEST  2 VIEW 3/16/208  COMPARISON:  10/24/2016.  FINDINGS: The cardiac silhouette remains near the upper limit of normal in size. Stable post CABG changes. Small linear scar in the left mid lung zone without significant change. Otherwise, clear lungs. Diffuse osteopenia. Aortic arch calcifications  IMPRESSION: No acute abnormality.  Aortic atherosclerosis.  EKG:  12/22/2017: NSR. Rate 80.  PULM: Spirometry 09/08/2018: FVC on spirometry 64% pre-predicted value. FEV1 on spirometry 51% of pre-predicted value. FEV1/FVC is 79% of the pre-predicted value.  CV: Nuclear stress 06/15/2013: Overall Impression:  Low risk stress nuclear study with consistent evidence of a moderate sized distal anterior-apical infarction with very mild peri-infarct ischemia..  LV Wall Motion:  Mildly reduced overall LV function with distal anterior-apical dyskinesis & septal wall motion consistent with prior Sternotomy.  LV Ejection Fraction: 48%  TTE 10/09/2010: Interpretation summary: There is mild concentric left ventricular hypertrophy.  Left  ventricular systolic function is moderately reduced.  EF approximately 40 to 45%.  The transmitral spectral Doppler flow pattern suggestive of impaired LV relaxation.  Segmental infero-apical and posterior wall motion abnormalities.  The LA is mildly dilated.  Right ventricular systolic pressure is normal.  Aortic valve appears to be mildly sclerotic.  Past Medical History:  Diagnosis Date  . Anxiety   . COPD (chronic obstructive pulmonary disease) ([place])   . Coronary artery disease 2006   Acute coronary syndrome in March 2006.   . Diabetes mellitus without complication ([place])   . Dyslipidemia   . Hypertension 10/09/2010   EF 40-45%  . Sleep apnea    uses cpap machine    Past Surgical History:  Procedure Laterality Date  . Arterial evalation      no evidence of ICA stenosis  . CARDIAC CATHETERIZATION    . CARDIAC SURGERY    . CORONARY ARTERY BYPASS GRAFT  2006   Post CABG surgery with a LIMA to the LAD, a vein to the diagonal ,,a vein to the the obtuse marginal and vein to the PDA.  [name] CORONARY STENT PLACEMENT  2010  . GRAFT(S) ANGIOGRAM  10/21/2011   Procedure: GRAFT(S) Cyril Loosen;  Surgeon: Leonie Man, MD;  Location[facility] C[facility] LAB;  Service: Cardiovascular;;  . LEFT HEART CATHETERIZATION WITH CORONARY ANGIOGRAM N/A 10/21/2011   Procedure: LEFT HEART CATHETERIZATION WITH CORONARY ANGIOGRAM;  Surgeon: Leonie Man, MD;  Location: [facility] CATH LAB;  Service: Cardiovascular;  Laterality: N/A;    MEDICATIONS: . ADVAIR DISKUS 500-50 MCG/DOSE AEPB  . ALPRAZolam (XANAX) 0.25 MG tablet  . amLODipine (NORVASC) 10 MG tablet  . aspirin EC 81 MG tablet  . atorvastatin (LIPITOR) 40 MG tablet  . busPIRone (BUSPAR) 7.5 MG tablet  . ezetimibe (ZETIA) 10 MG tablet  . famotidine (PEPCID) 20 MG tablet  . INCRUSE ELLIPTA 62.5 MCG/INH AEPB  . Ipratropium-Albuterol (COMBIVENT RESPIMAT) 20-100 MCG/ACT AERS respimat  . metFORMIN (GLUCOPHAGE) 500 MG tablet  . ranolazine (RANEXA) 1000 MG SR tablet  .  TRELEGY ELLIPTA 100-62.5-25 MCG/INH AEPB  . clopidogrel (PLAVIX) 75 MG tablet  . isosorbide mononitrate (IMDUR) 60 MG 24 hr tablet  . losartan-hydrochlorothiazide (HYZAAR) 100-12.5 MG tablet  . metoprolol (TOPROL-XL) 200 MG 24 hr tablet   No current facility-administered medications for this encounter.      Armando, Bukhari [facility] Short Stay Center/Anesthesiology Phone [phone] 09/30/2018 9:56 AM

## 2018-09-30 NOTE — Anesthesia Preprocedure Evaluation (Addendum)
Anesthesia Evaluation  Patient identified by MRN, date of birth, ID band Patient awake    Reviewed: Allergy & Precautions, NPO status , Patient's Chart, lab work & pertinent test results  History of Anesthesia Complications Negative for: history of anesthetic complications  Airway Mallampati: II  TM Distance: >3 FB Neck ROM: Full   Comment: Ulceration on lower lip Dental  (+) Edentulous Upper, Edentulous Lower, Dental Advisory Given   Pulmonary sleep apnea and Continuous Positive Airway Pressure Ventilation , COPD,  COPD inhaler, Current Smoker,    + rhonchi  + decreased breath sounds(-) wheezing      Cardiovascular hypertension, + CAD, + Cardiac Stents, + CABG and +CHF   Rhythm:Regular Rate:Normal     Neuro/Psych PSYCHIATRIC DISORDERS Anxiety negative neurological ROS     GI/Hepatic Neg liver ROS, GERD  ,  Endo/Other  diabetes, Well Controlled, Oral Hypoglycemic Agents  Renal/GU Renal InsufficiencyRenal disease  negative genitourinary   Musculoskeletal negative musculoskeletal ROS (+)   Abdominal   Peds  Hematology negative hematology ROS (+)   Anesthesia Other Findings 67 yo M for right neck dissection and partial pinnectomy for melanoma - anxiety, COPD, current smoker (100 PY), OSA on CPAP, HTN, CAD s/p CABG (2006) & PCI (2010), CHF (EF 40-45%), GERD, DM - Spirometry 09/08/2018: FVC 64% predicted, FEV1 51% predicted, FEV1/FVC 79% predicted - Nuclear stress 06/15/2013: Overall Impression: Low risk stress nuclear study with consistent evidence of a moderate sized distal anterior-apical infarction with very mild peri-infarct ischemia; LV Wall Motion: Mildly reduced overall LV function with distal anterior-apical dyskinesis & septal wall motion consistent with prior Sternotomy; LV Ejection Fraction: 48% - TTE 10/09/2010: Interpretation summary: There is mild concentric left ventricular hypertrophy.  Left ventricular  systolic function is moderately reduced.  EF approximately 40 to 45%.  The transmitral spectral Doppler flow pattern suggestive of impaired LV relaxation.  Segmental infero-apical and posterior wall motion abnormalities.  The LA is mildly dilated.  Right ventricular systolic pressure is normal.  Aortic valve appears to be mildly sclerotic.  Reproductive/Obstetrics                         Anesthesia Physical Anesthesia Plan  ASA: III  Anesthesia Plan: General   Post-op Pain Management:    Induction: Intravenous  PONV Risk Score and Plan: 1 and Ondansetron, Dexamethasone, Midazolam and Treatment may vary due to age or medical condition  Airway Management Planned: Oral ETT  Additional Equipment: None  Intra-op Plan:   Post-operative Plan: Extubation in OR  Informed Consent: I have reviewed the patients History and Physical, chart, labs and discussed the procedure including the risks, benefits and alternatives for the proposed anesthesia with the patient or authorized representative who has indicated his/her understanding and acceptance.     Plan Discussed with:   Anesthesia Plan Comments: (See PAT note 09/29/2018 by Karoline Caldwell, PA-C  Discussed small possibility of postop ventilation with hx of significant COPD.)    Anesthesia Quick Evaluation

## 2018-10-01 MED ORDER — RANOLAZINE ER 1000 MG PO TB12: 1000.0000 mg | ORAL_TABLET | Freq: Two times a day (BID) | ORAL | 4 refills | Status: DC

## 2018-10-01 NOTE — Addendum Note (Signed)
Addended by: Diana Eves on: 10/01/2018 12:06 PM   Modules accepted: Orders

## 2018-10-05 ENCOUNTER — Observation Stay (HOSPITAL_COMMUNITY)
Admission: RE | Admit: 2018-10-05 | Discharge: 2018-10-06 | Disposition: A | Discharge status: Home / Self Care | Payer: Medicare Other | Attending: Otolaryngology | Admitting: Otolaryngology

## 2018-10-05 ENCOUNTER — Encounter (HOSPITAL_COMMUNITY): Payer: Self-pay | Admitting: *Deleted

## 2018-10-05 ENCOUNTER — Encounter (HOSPITAL_COMMUNITY)
Admission: RE | Disposition: A | Discharge status: Home / Self Care | Payer: Self-pay | Source: Home / Self Care | Attending: Otolaryngology

## 2018-10-05 ENCOUNTER — Other Ambulatory Visit: Payer: Self-pay

## 2018-10-05 ENCOUNTER — Ambulatory Visit (HOSPITAL_COMMUNITY): Payer: Medicare Other | Admitting: Physician Assistant

## 2018-10-05 ENCOUNTER — Ambulatory Visit (HOSPITAL_COMMUNITY): Payer: Medicare Other | Admitting: Anesthesiology

## 2018-10-05 DIAGNOSIS — Z7984 Long term (current) use of oral hypoglycemic drugs: Secondary | ICD-10-CM | POA: Insufficient documentation

## 2018-10-05 DIAGNOSIS — F419 Anxiety disorder, unspecified: Secondary | ICD-10-CM | POA: Insufficient documentation

## 2018-10-05 DIAGNOSIS — K219 Gastro-esophageal reflux disease without esophagitis: Secondary | ICD-10-CM | POA: Insufficient documentation

## 2018-10-05 DIAGNOSIS — C4321 Malignant melanoma of right ear and external auricular canal: Secondary | ICD-10-CM | POA: Diagnosis not present

## 2018-10-05 DIAGNOSIS — D0321 Melanoma in situ of right ear and external auricular canal: Secondary | ICD-10-CM | POA: Diagnosis present

## 2018-10-05 DIAGNOSIS — J449 Chronic obstructive pulmonary disease, unspecified: Secondary | ICD-10-CM | POA: Diagnosis not present

## 2018-10-05 DIAGNOSIS — G4733 Obstructive sleep apnea (adult) (pediatric): Secondary | ICD-10-CM | POA: Diagnosis not present

## 2018-10-05 DIAGNOSIS — G4730 Sleep apnea, unspecified: Secondary | ICD-10-CM | POA: Diagnosis not present

## 2018-10-05 DIAGNOSIS — I110 Hypertensive heart disease with heart failure: Secondary | ICD-10-CM | POA: Diagnosis not present

## 2018-10-05 DIAGNOSIS — K130 Diseases of lips: Secondary | ICD-10-CM | POA: Insufficient documentation

## 2018-10-05 DIAGNOSIS — Z888 Allergy status to other drugs, medicaments and biological substances status: Secondary | ICD-10-CM | POA: Diagnosis not present

## 2018-10-05 DIAGNOSIS — I2510 Atherosclerotic heart disease of native coronary artery without angina pectoris: Secondary | ICD-10-CM | POA: Diagnosis not present

## 2018-10-05 DIAGNOSIS — Z7982 Long term (current) use of aspirin: Secondary | ICD-10-CM | POA: Diagnosis not present

## 2018-10-05 DIAGNOSIS — E119 Type 2 diabetes mellitus without complications: Secondary | ICD-10-CM | POA: Diagnosis not present

## 2018-10-05 DIAGNOSIS — Z885 Allergy status to narcotic agent status: Secondary | ICD-10-CM | POA: Diagnosis not present

## 2018-10-05 DIAGNOSIS — Z85828 Personal history of other malignant neoplasm of skin: Secondary | ICD-10-CM | POA: Diagnosis not present

## 2018-10-05 DIAGNOSIS — Z955 Presence of coronary angioplasty implant and graft: Secondary | ICD-10-CM | POA: Insufficient documentation

## 2018-10-05 DIAGNOSIS — Z951 Presence of aortocoronary bypass graft: Secondary | ICD-10-CM | POA: Insufficient documentation

## 2018-10-05 DIAGNOSIS — Z79899 Other long term (current) drug therapy: Secondary | ICD-10-CM | POA: Insufficient documentation

## 2018-10-05 DIAGNOSIS — F17200 Nicotine dependence, unspecified, uncomplicated: Secondary | ICD-10-CM | POA: Insufficient documentation

## 2018-10-05 HISTORY — PX: LYMPH NODE BIOPSY: SHX201

## 2018-10-05 HISTORY — PX: EAR CYST EXCISION: SHX22

## 2018-10-05 HISTORY — DX: Melanoma in situ of right ear and external auricular canal: D03.21

## 2018-10-05 LAB — GLUCOSE, CAPILLARY
Glucose-Capillary: 135 mg/dL — ABNORMAL HIGH (ref 70–99)
Glucose-Capillary: 129 mg/dL — ABNORMAL HIGH (ref 70–99)
Glucose-Capillary: 280 mg/dL — ABNORMAL HIGH (ref 70–99)
Glucose-Capillary: 253 mg/dL — ABNORMAL HIGH (ref 70–99)

## 2018-10-05 SURGERY — LYMPH NODE BIOPSY
Anesthesia: General | Site: Neck | Laterality: Right

## 2018-10-05 MED ORDER — ONDANSETRON HCL 4 MG/2ML IJ SOLN
INTRAMUSCULAR | Status: AC
  Filled 2018-10-05: qty 2

## 2018-10-05 MED ORDER — UMECLIDINIUM BROMIDE 62.5 MCG/INH IN AEPB
1.0000 | INHALATION_SPRAY | Freq: Every day | RESPIRATORY_TRACT | Status: DC
  Filled 2018-10-05: qty 7

## 2018-10-05 MED ORDER — EPHEDRINE SULFATE-NACL 50-0.9 MG/10ML-% IV SOSY
PREFILLED_SYRINGE | INTRAVENOUS | Status: DC | PRN
  Administered 2018-10-05: 5 mg via INTRAVENOUS
  Administered 2018-10-05: 10 mg via INTRAVENOUS

## 2018-10-05 MED ORDER — METOPROLOL SUCCINATE ER 100 MG PO TB24
100.0000 mg | ORAL_TABLET | Freq: Two times a day (BID) | ORAL | Status: DC
  Administered 2018-10-05 (×2): 100 mg via ORAL
  Filled 2018-10-05 (×2): qty 1

## 2018-10-05 MED ORDER — LIDOCAINE-EPINEPHRINE 1 %-1:100000 IJ SOLN
INTRAMUSCULAR | Status: DC | PRN
  Administered 2018-10-05: 1.5 mL

## 2018-10-05 MED ORDER — HYDROCODONE-ACETAMINOPHEN 5-325 MG PO TABS: 1.0000 | ORAL_TABLET | ORAL | Status: DC | PRN

## 2018-10-05 MED ORDER — BUSPIRONE HCL 15 MG PO TABS
7.5000 mg | ORAL_TABLET | Freq: Two times a day (BID) | ORAL | Status: DC
  Administered 2018-10-05: 7.5 mg via ORAL
  Filled 2018-10-05: qty 1

## 2018-10-05 MED ORDER — FAMOTIDINE 20 MG PO TABS
20.0000 mg | ORAL_TABLET | Freq: Every day | ORAL | Status: DC
  Administered 2018-10-05: 20 mg via ORAL
  Filled 2018-10-05: qty 1

## 2018-10-05 MED ORDER — ALBUTEROL SULFATE (2.5 MG/3ML) 0.083% IN NEBU
INHALATION_SOLUTION | RESPIRATORY_TRACT | Status: AC
  Administered 2018-10-05: 2.5 mg via RESPIRATORY_TRACT
  Filled 2018-10-05: qty 3

## 2018-10-05 MED ORDER — LACTATED RINGERS IV SOLN: INTRAVENOUS | Status: DC

## 2018-10-05 MED ORDER — ONDANSETRON HCL 4 MG/2ML IJ SOLN
INTRAMUSCULAR | Status: DC | PRN
  Administered 2018-10-05: 4 mg via INTRAVENOUS

## 2018-10-05 MED ORDER — SUGAMMADEX SODIUM 200 MG/2ML IV SOLN
INTRAVENOUS | Status: DC | PRN
  Administered 2018-10-05: 175 mg via INTRAVENOUS

## 2018-10-05 MED ORDER — LIDOCAINE 2% (20 MG/ML) 5 ML SYRINGE
INTRAMUSCULAR | Status: AC
  Filled 2018-10-05: qty 5

## 2018-10-05 MED ORDER — IPRATROPIUM-ALBUTEROL 20-100 MCG/ACT IN AERS: 1.0000 | INHALATION_SPRAY | Freq: Four times a day (QID) | RESPIRATORY_TRACT | Status: DC

## 2018-10-05 MED ORDER — AMLODIPINE BESYLATE 10 MG PO TABS: 10.0000 mg | ORAL_TABLET | Freq: Every day | ORAL | Status: DC

## 2018-10-05 MED ORDER — PROPOFOL 10 MG/ML IV BOLUS
INTRAVENOUS | Status: DC | PRN
  Administered 2018-10-05: 120 mg via INTRAVENOUS

## 2018-10-05 MED ORDER — BACITRACIN ZINC 500 UNIT/GM EX OINT
TOPICAL_OINTMENT | CUTANEOUS | Status: AC
  Filled 2018-10-05: qty 28.35

## 2018-10-05 MED ORDER — LIDOCAINE 2% (20 MG/ML) 5 ML SYRINGE
INTRAMUSCULAR | Status: DC | PRN
  Administered 2018-10-05: 100 mg via INTRAVENOUS

## 2018-10-05 MED ORDER — ATORVASTATIN CALCIUM 40 MG PO TABS
40.0000 mg | ORAL_TABLET | Freq: Every day | ORAL | Status: DC
  Administered 2018-10-05: 40 mg via ORAL
  Filled 2018-10-05: qty 1

## 2018-10-05 MED ORDER — DEXAMETHASONE SODIUM PHOSPHATE 10 MG/ML IJ SOLN
INTRAMUSCULAR | Status: DC | PRN
  Administered 2018-10-05: 10 mg via INTRAVENOUS

## 2018-10-05 MED ORDER — DEXAMETHASONE SODIUM PHOSPHATE 10 MG/ML IJ SOLN
INTRAMUSCULAR | Status: AC
  Filled 2018-10-05: qty 1

## 2018-10-05 MED ORDER — LOSARTAN POTASSIUM-HCTZ 100-12.5 MG PO TABS: 1.0000 | ORAL_TABLET | Freq: Every day | ORAL | Status: DC

## 2018-10-05 MED ORDER — INSULIN ASPART 100 UNIT/ML ~~LOC~~ SOLN
0.0000 [IU] | Freq: Three times a day (TID) | SUBCUTANEOUS | Status: DC
  Administered 2018-10-05: 5 [IU] via SUBCUTANEOUS
  Administered 2018-10-06: 1 [IU] via SUBCUTANEOUS

## 2018-10-05 MED ORDER — ONDANSETRON HCL 4 MG/2ML IJ SOLN: 4.0000 mg | Freq: Once | INTRAMUSCULAR | Status: DC | PRN

## 2018-10-05 MED ORDER — 0.9 % SODIUM CHLORIDE (POUR BTL) OPTIME
TOPICAL | Status: DC | PRN
  Administered 2018-10-05: 1000 mL

## 2018-10-05 MED ORDER — FLUTICASONE-UMECLIDIN-VILANT 100-62.5-25 MCG/INH IN AEPB: 1.0000 | INHALATION_SPRAY | Freq: Every day | RESPIRATORY_TRACT | Status: DC

## 2018-10-05 MED ORDER — ROCURONIUM BROMIDE 50 MG/5ML IV SOSY
PREFILLED_SYRINGE | INTRAVENOUS | Status: AC
  Filled 2018-10-05: qty 5

## 2018-10-05 MED ORDER — PHENYLEPHRINE 40 MCG/ML (10ML) SYRINGE FOR IV PUSH (FOR BLOOD PRESSURE SUPPORT)
PREFILLED_SYRINGE | INTRAVENOUS | Status: AC
  Filled 2018-10-05: qty 10

## 2018-10-05 MED ORDER — ONDANSETRON HCL 4 MG/2ML IJ SOLN: INTRAMUSCULAR | Status: DC | PRN

## 2018-10-05 MED ORDER — LIDOCAINE-EPINEPHRINE 1 %-1:100000 IJ SOLN
INTRAMUSCULAR | Status: AC
  Filled 2018-10-05: qty 1

## 2018-10-05 MED ORDER — IPRATROPIUM-ALBUTEROL 0.5-2.5 (3) MG/3ML IN SOLN
3.0000 mL | Freq: Four times a day (QID) | RESPIRATORY_TRACT | Status: DC
  Administered 2018-10-05 (×2): 3 mL via RESPIRATORY_TRACT
  Filled 2018-10-05 (×4): qty 3

## 2018-10-05 MED ORDER — HYDROCHLOROTHIAZIDE 12.5 MG PO CAPS: 12.5000 mg | ORAL_CAPSULE | Freq: Every day | ORAL | Status: DC

## 2018-10-05 MED ORDER — MIDAZOLAM HCL 5 MG/5ML IJ SOLN
INTRAMUSCULAR | Status: DC | PRN
  Administered 2018-10-05: 2 mg via INTRAVENOUS

## 2018-10-05 MED ORDER — HYDROMORPHONE HCL 1 MG/ML IJ SOLN: 0.2500 mg | INTRAMUSCULAR | Status: DC | PRN

## 2018-10-05 MED ORDER — INSULIN ASPART 100 UNIT/ML ~~LOC~~ SOLN
0.0000 [IU] | Freq: Every day | SUBCUTANEOUS | Status: DC
  Administered 2018-10-05: 3 [IU] via SUBCUTANEOUS

## 2018-10-05 MED ORDER — PROMETHAZINE HCL 25 MG RE SUPP
25.0000 mg | Freq: Four times a day (QID) | RECTAL | Status: DC | PRN
  Filled 2018-10-05: qty 1

## 2018-10-05 MED ORDER — BACITRACIN ZINC 500 UNIT/GM EX OINT
1.0000 "application " | TOPICAL_OINTMENT | Freq: Three times a day (TID) | CUTANEOUS | Status: DC
  Administered 2018-10-05: 1 via TOPICAL

## 2018-10-05 MED ORDER — INSULIN ASPART 100 UNIT/ML ~~LOC~~ SOLN: 0.0000 [IU] | Freq: Three times a day (TID) | SUBCUTANEOUS | Status: DC

## 2018-10-05 MED ORDER — ALBUTEROL SULFATE HFA 108 (90 BASE) MCG/ACT IN AERS
INHALATION_SPRAY | RESPIRATORY_TRACT | Status: AC
  Filled 2018-10-05: qty 6.7

## 2018-10-05 MED ORDER — ASPIRIN EC 81 MG PO TBEC: 81.0000 mg | DELAYED_RELEASE_TABLET | Freq: Every day | ORAL | Status: DC

## 2018-10-05 MED ORDER — ALBUTEROL SULFATE HFA 108 (90 BASE) MCG/ACT IN AERS
INHALATION_SPRAY | RESPIRATORY_TRACT | Status: DC | PRN
  Administered 2018-10-05: 5 via RESPIRATORY_TRACT

## 2018-10-05 MED ORDER — BACITRACIN ZINC 500 UNIT/GM EX OINT
TOPICAL_OINTMENT | CUTANEOUS | Status: DC | PRN
  Administered 2018-10-05: 1 via TOPICAL

## 2018-10-05 MED ORDER — EPHEDRINE 5 MG/ML INJ
INTRAVENOUS | Status: AC
  Filled 2018-10-05: qty 10

## 2018-10-05 MED ORDER — ISOSORBIDE MONONITRATE ER 60 MG PO TB24
60.0000 mg | ORAL_TABLET | Freq: Every day | ORAL | Status: DC
  Administered 2018-10-05: 60 mg via ORAL
  Filled 2018-10-05: qty 1

## 2018-10-05 MED ORDER — IBUPROFEN 100 MG/5ML PO SUSP
400.0000 mg | Freq: Four times a day (QID) | ORAL | Status: DC | PRN
  Filled 2018-10-05: qty 20

## 2018-10-05 MED ORDER — PHENYLEPHRINE 40 MCG/ML (10ML) SYRINGE FOR IV PUSH (FOR BLOOD PRESSURE SUPPORT)
PREFILLED_SYRINGE | INTRAVENOUS | Status: DC | PRN
  Administered 2018-10-05 (×3): 40 ug via INTRAVENOUS
  Administered 2018-10-05: 80 ug via INTRAVENOUS

## 2018-10-05 MED ORDER — FENTANYL CITRATE (PF) 100 MCG/2ML IJ SOLN
INTRAMUSCULAR | Status: DC | PRN
  Administered 2018-10-05: 100 ug via INTRAVENOUS

## 2018-10-05 MED ORDER — FENTANYL CITRATE (PF) 250 MCG/5ML IJ SOLN
INTRAMUSCULAR | Status: AC
  Filled 2018-10-05: qty 5

## 2018-10-05 MED ORDER — CEPHALEXIN 250 MG/5ML PO SUSR
500.0000 mg | Freq: Two times a day (BID) | ORAL | Status: DC
  Administered 2018-10-05 (×2): 500 mg via ORAL
  Filled 2018-10-05 (×3): qty 10

## 2018-10-05 MED ORDER — EZETIMIBE 10 MG PO TABS
10.0000 mg | ORAL_TABLET | Freq: Every day | ORAL | Status: DC
  Administered 2018-10-05: 10 mg via ORAL
  Filled 2018-10-05 (×2): qty 1

## 2018-10-05 MED ORDER — MIDAZOLAM HCL 2 MG/2ML IJ SOLN
INTRAMUSCULAR | Status: AC
  Filled 2018-10-05: qty 2

## 2018-10-05 MED ORDER — OXYCODONE HCL 5 MG PO TABS: 5.0000 mg | ORAL_TABLET | Freq: Once | ORAL | Status: DC | PRN

## 2018-10-05 MED ORDER — PROPOFOL 10 MG/ML IV BOLUS
INTRAVENOUS | Status: AC
  Filled 2018-10-05: qty 20

## 2018-10-05 MED ORDER — MOMETASONE FURO-FORMOTEROL FUM 200-5 MCG/ACT IN AERO: 2.0000 | INHALATION_SPRAY | Freq: Two times a day (BID) | RESPIRATORY_TRACT | Status: DC

## 2018-10-05 MED ORDER — METFORMIN HCL 500 MG PO TABS
500.0000 mg | ORAL_TABLET | Freq: Two times a day (BID) | ORAL | Status: DC
  Administered 2018-10-05 – 2018-10-06 (×2): 500 mg via ORAL
  Filled 2018-10-05 (×3): qty 1

## 2018-10-05 MED ORDER — ROCURONIUM BROMIDE 10 MG/ML (PF) SYRINGE
PREFILLED_SYRINGE | INTRAVENOUS | Status: DC | PRN
  Administered 2018-10-05: 50 mg via INTRAVENOUS

## 2018-10-05 MED ORDER — ALBUTEROL SULFATE (2.5 MG/3ML) 0.083% IN NEBU
2.5000 mg | INHALATION_SOLUTION | Freq: Four times a day (QID) | RESPIRATORY_TRACT | Status: DC | PRN
  Administered 2018-10-05: 2.5 mg via RESPIRATORY_TRACT

## 2018-10-05 MED ORDER — RANOLAZINE ER 500 MG PO TB12
1000.0000 mg | ORAL_TABLET | Freq: Two times a day (BID) | ORAL | Status: DC
  Administered 2018-10-05: 1000 mg via ORAL
  Filled 2018-10-05 (×2): qty 2

## 2018-10-05 MED ORDER — OXYCODONE HCL 5 MG/5ML PO SOLN: 5.0000 mg | Freq: Once | ORAL | Status: DC | PRN

## 2018-10-05 MED ORDER — CIPROFLOXACIN-DEXAMETHASONE 0.3-0.1 % OT SUSP
OTIC | Status: AC
  Filled 2018-10-05: qty 7.5

## 2018-10-05 MED ORDER — ALPRAZOLAM 0.25 MG PO TABS
0.2500 mg | ORAL_TABLET | Freq: Three times a day (TID) | ORAL | Status: DC
  Administered 2018-10-05: 0.25 mg via ORAL
  Filled 2018-10-05: qty 1

## 2018-10-05 MED ORDER — LOSARTAN POTASSIUM 50 MG PO TABS: 100.0000 mg | ORAL_TABLET | Freq: Every day | ORAL | Status: DC

## 2018-10-05 MED ORDER — LACTATED RINGERS IV SOLN
INTRAVENOUS | Status: DC
  Administered 2018-10-05 (×2): via INTRAVENOUS

## 2018-10-05 MED ORDER — PROMETHAZINE HCL 25 MG PO TABS: 25.0000 mg | ORAL_TABLET | Freq: Four times a day (QID) | ORAL | Status: DC | PRN

## 2018-10-05 SURGICAL SUPPLY — 44 items
APPLIER CLIP 9.375 SM OPEN (CLIP)
BLADE SURG 15 STRL LF DISP TIS (BLADE) IMPLANT
BLADE SURG 15 STRL SS (BLADE)
CANISTER SUCT 3000ML PPV (MISCELLANEOUS) ×4 IMPLANT
CLEANER TIP ELECTROSURG 2X2 (MISCELLANEOUS) ×4 IMPLANT
CLIP APPLIE 9.375 SM OPEN (CLIP) IMPLANT
CONT SPEC 4OZ CLIKSEAL STRL BL (MISCELLANEOUS) IMPLANT
CORDS BIPOLAR (ELECTRODE) IMPLANT
COTTONBALL LRG STERILE PKG (GAUZE/BANDAGES/DRESSINGS) ×4 IMPLANT
COVER SURGICAL LIGHT HANDLE (MISCELLANEOUS) ×4 IMPLANT
COVER WAND RF STERILE (DRAPES) ×4 IMPLANT
DERMABOND ADVANCED (GAUZE/BANDAGES/DRESSINGS) ×2
DERMABOND ADVANCED .7 DNX12 (GAUZE/BANDAGES/DRESSINGS) ×2 IMPLANT
DRAIN HEMOVAC 7FR (DRAIN) IMPLANT
DRAIN PENROSE 1/4X12 LTX STRL (WOUND CARE) ×4 IMPLANT
DRAPE HALF SHEET 40X57 (DRAPES) IMPLANT
DRAPE MICROSCOPE LEICA 54X105 (DRAPE) ×4 IMPLANT
ELECT COATED BLADE 2.86 ST (ELECTRODE) ×4 IMPLANT
ELECT REM PT RETURN 9FT ADLT (ELECTROSURGICAL) ×4
ELECTRODE REM PT RTRN 9FT ADLT (ELECTROSURGICAL) ×2 IMPLANT
EVACUATOR SILICONE 100CC (DRAIN) IMPLANT
GAUZE 4X4 16PLY RFD (DISPOSABLE) ×4 IMPLANT
GAUZE SPONGE 4X4 12PLY STRL LF (GAUZE/BANDAGES/DRESSINGS) ×8 IMPLANT
GLOVE ECLIPSE 7.5 STRL STRAW (GLOVE) ×4 IMPLANT
GOWN STRL REUS W/ TWL LRG LVL3 (GOWN DISPOSABLE) ×4 IMPLANT
GOWN STRL REUS W/TWL LRG LVL3 (GOWN DISPOSABLE) ×4
KIT BASIN OR (CUSTOM PROCEDURE TRAY) ×4 IMPLANT
KIT TURNOVER KIT B (KITS) ×4 IMPLANT
NEEDLE 27GAX1/2IN MONOJET (NEEDLE) ×4 IMPLANT
NEEDLE PRECISIONGLIDE 27X1.5 (NEEDLE) IMPLANT
NS IRRIG 1000ML POUR BTL (IV SOLUTION) ×4 IMPLANT
PAD ARMBOARD 7.5X6 YLW CONV (MISCELLANEOUS) ×8 IMPLANT
PENCIL FOOT CONTROL (ELECTRODE) ×4 IMPLANT
SPONGE INTESTINAL PEANUT (DISPOSABLE) IMPLANT
STAPLER VISISTAT 35W (STAPLE) ×4 IMPLANT
SUT CHROMIC 3 0 SH 27 (SUTURE) ×4 IMPLANT
SUT CHROMIC 4 0 PS 2 18 (SUTURE) ×12 IMPLANT
SUT ETHILON 3 0 PS 1 (SUTURE) IMPLANT
SUT SILK 2 0 SH (SUTURE) ×4 IMPLANT
SUT SILK 4 0 REEL (SUTURE) IMPLANT
TOWEL OR 17X24 6PK STRL BLUE (TOWEL DISPOSABLE) ×4 IMPLANT
TRAY ENT MC OR (CUSTOM PROCEDURE TRAY) ×4 IMPLANT
TUBE CONNECTING 12'X1/4 (SUCTIONS) ×1
TUBE CONNECTING 12X1/4 (SUCTIONS) ×3 IMPLANT

## 2018-10-05 NOTE — Progress Notes (Signed)
   ENT Progress Note: s/p Procedure(s): sentinel NODE BIOPSY with right partial modified neck dissection Right Partial pinnectomy   Subjective: Mild pain, tol po  Objective: Vital signs in last 24 hours: Temp:  [97.9 F (36.6 C)-98.2 F (36.8 C)] 98 F (36.7 C) (12/23 1223) Pulse Rate:  [79-86] 84 (12/23 1223) Resp:  [18-22] 18 (12/23 1223) BP: (111-129)/(67-76) 120/69 (12/23 1223) SpO2:  [90 %-98 %] 92 % (12/23 1446) Weight change:     Intake/Output from previous day: No intake/output data recorded. Intake/Output this shift: Total I/O In: 1480 [P.O.:480; I.V.:1000] Out: 135 [Urine:125; Blood:10]  Labs: No results for input(s): WBC, HGB, HCT, PLT in the last 72 hours. No results for input(s): NA, K, CL, CO2, GLUCOSE, BUN, CALCIUM in the last 72 hours.  Invalid input(s): CREATININR  Studies/Results: No results found.   PHYSICAL EXAM: Dressing intact Inc stable w/o swelling  Assessment/Plan: Pt stable  Monitor o/n - plan d/c in am    Jerrell Belfast 10/05/2018, 5:02 PM

## 2018-10-05 NOTE — Anesthesia Postprocedure Evaluation (Signed)
Anesthesia Post Note  Patient: Troy [name]  Procedure(s) Performed: sentinel NODE BIOPSY with right partial modified neck dissection (Right Neck) Right Partial pinnectomy (Right Ear)     Patient location during evaluation: PACU Anesthesia Type: General Level of consciousness: sedated and patient cooperative Pain management: pain level controlled Vital Signs Assessment: post-procedure vital signs reviewed and stable Respiratory status: spontaneous breathing Cardiovascular status: stable Anesthetic complications: no    Last Vitals:  Vitals:   10/05/18 1203 10/05/18 1223  BP: 129/76 120/69  Pulse: 84 84  Resp: 18 18  Temp:  36.7 C  SpO2: 94%     Last Pain:  Vitals:   10/05/18 1203  TempSrc:   PainSc: [place]

## 2018-10-05 NOTE — Interval H&P Note (Signed)
History and Physical Interval Note:  10/05/2018 9:20 AM  [name]  has presented today for surgery, with the diagnosis of skin melanoma  The various methods of treatment have been discussed with the patient and family. After consideration of risks, benefits and other options for treatment, the patient has consented to  Procedure(s): sentinel NODE BIOPSY with right partial modified neck dissection (Right) Right Partial pinnectomy (Right) as a surgical intervention .  The patient's history has been reviewed, patient examined, no change in status, stable for surgery.  I have reviewed the patient's chart and labs.  Questions were answered to the patient's satisfaction.     Troy [name]

## 2018-10-05 NOTE — Op Note (Signed)
OPERATIVE REPORT  DATE OF SURGERY: 10/05/2018  PATIENT:  Troy [name],  67 y.o. male  PRE-OPERATIVE DIAGNOSIS:  skin melanoma  POST-OPERATIVE DIAGNOSIS:  skin melanoma  PROCEDURE:  Procedure(s): sentinel NODE BIOPSY with right partial modified neck dissection Right Partial pinnectomy  SURGEON:  Beckie Salts, MD  ASSISTANTS: Wynonia Sours, RNFA  ANESTHESIA:   General   EBL: 20 ml  DRAINS: Quarter-inch Penrose  LOCAL MEDICATIONS USED: 1% Xylocaine with epinephrine of the ear  SPECIMEN: 1.  Right partial penectomy, single suture marks inferior margin, double suture marks anterior. 2.  Right level 2 neck dissection for sentinel node biopsy.  Frozen section negative for melanoma.  COUNTS:  Correct  PROCEDURE DETAILS: The patient was taken to the operating room and placed on the operating table in the supine position. Following induction of general endotracheal anesthesia, the right ear and neck were prepped and draped in a standard fashion.  1.  Right partial penectomy.  The lesion was identified in the right pinna and 2 cm margins were outlined with a marking pen.  Local anesthetic was infiltrated.  Electrocautery was used to incise the skin cartilage and through to the posterior side of the pinna removing the entire lesion.  6 marking sutures were placed.  2.  Right partial neck dissection, level 2, with sentinel node biopsy.  The ear incision was extended down into the neck about 10 cm.  Skin flaps were elevated anteriorly and posteriorly.  Self-retaining retractor was used throughout the case.  Fibrofatty tissue was dissected off the upper part of the sternocleidomastoid muscle.  The muscle was reflected posteriorly and dissection continued into the posterior superior triangle.  Fibrofatty tissue was dissected off the splenius capitis and levator scapular muscles.  The spinal accessory nerve was identified and preserved.  Additional soft tissue was dissected inferior to that.   The posterior belly digastric was identified and reflected up superiorly.  Fibrofatty tissue medial to that was also dissected down to the internal jugular vein.  The vein was preserved.  There are multiple elongated lymph nodes identified in the level 2 and this was all dissected out freely.  Specimen was sent for frozen section analysis.  Samples of the 3 largest lymph nodes were all negative for melanoma.  The wound was irrigated with saline.  Penrose was kept in place.  This was secured with a safety pin and silk suture.  Incision was closed in layers using interrupted 3-0 chromic on the deep layer and running chromic on the skin.  Interrupted chromic was used to reapproximate the ear defect.  Bacitracin was applied and a dressing.  Patient was awakened extubated and transferred recovery in stable condition.    PATIENT DISPOSITION:  To PACU, stable

## 2018-10-05 NOTE — Transfer of Care (Signed)
Immediate Anesthesia Transfer of Care Note  Patient: Troy [name]  Procedure(s) Performed: sentinel NODE BIOPSY with right partial modified neck dissection (Right Neck) Right Partial pinnectomy (Right Ear)  Patient Location: PACU  Anesthesia Type:General  Level of Consciousness: oriented, drowsy and patient cooperative  Airway & Oxygen Therapy: Patient Spontanous Breathing and Patient connected to nasal cannula oxygen  Post-op Assessment: Report given to RN and Post -op Vital signs reviewed and stable  Post vital signs: Reviewed  Last Vitals:  Vitals Value Taken Time  BP 126/69 10/05/2018 11:16 AM  Temp    Pulse 81 10/05/2018 11:21 AM  Resp 23 10/05/2018 11:21 AM  SpO2 95 % 10/05/2018 11:21 AM  Vitals shown include unvalidated device data.  Last Pain:  Vitals:   10/05/18 0802  TempSrc:   PainSc: 0-No pain         Complications: No apparent anesthesia complications

## 2018-10-05 NOTE — Progress Notes (Signed)
Verified with Dr. Constance Holster that pt. Did not need a sentinel node injection prior to surgery.

## 2018-10-05 NOTE — Anesthesia Procedure Notes (Signed)

## 2018-10-06 ENCOUNTER — Encounter (HOSPITAL_COMMUNITY): Payer: Self-pay | Admitting: Otolaryngology

## 2018-10-06 DIAGNOSIS — C4321 Malignant melanoma of right ear and external auricular canal: Secondary | ICD-10-CM | POA: Diagnosis not present

## 2018-10-06 LAB — GLUCOSE, CAPILLARY: Glucose-Capillary: 147 mg/dL — ABNORMAL HIGH (ref 70–99)

## 2018-10-06 NOTE — Discharge Instructions (Signed)
Use bacitracin ointment twice daily  Keep a dressing on until there is no more drainage.  Start Plavix again on Wednesday.

## 2018-10-06 NOTE — Progress Notes (Signed)
Hunt Oris Jhaveri to be D/C'd  per MD order. Discussed with the patient and all questions fully answered.  VSS, Skin clean, dry and intact without evidence of skin break down, no evidence of skin tears noted.  IV catheter discontinued intact. Site without signs and symptoms of complications. Dressing and pressure applied.  An After Visit Summary was printed and given to the patient. Patient received prescription.  D/c education completed with patient/family including follow up instructions, medication list, d/c activities limitations if indicated, with other d/c instructions as indicated by MD - patient able to verbalize understanding, all questions fully answered.   Patient instructed to return to ED, call 911, or call MD for any changes in condition.   Patient to be escorted via [place], and D/C home via private auto.

## 2018-10-06 NOTE — Discharge Summary (Signed)
Physician Discharge Summary  Patient ID: Troy [name] MRN: [id number] DOB/AGE: 1950-12-23 67 y.o.  Admit date: 10/05/2018 Discharge date: 10/06/2018  Admission Diagnoses: Melanoma right ear  Discharge Diagnoses:  Active Problems:   Melanoma in situ of ear, right [facility])   Discharged Condition: good  Hospital Course: No complications.  Consults: none  Significant Diagnostic Studies: none  Treatments: surgery: Right side partial penectomy, right side partial neck dissection for sentinel node biopsy  Discharge Exam: Blood pressure 111/62, pulse 75, temperature 98.5 F (36.9 C), temperature source Oral, resp. rate 19, SpO2 92 %. PHYSICAL EXAM: Awake and alert, incisions look excellent.  Drain removed.  No signs of infection.  Disposition: Discharge disposition: 01-Home or Self Care       Discharge Instructions    Diet - low sodium heart healthy   Complete by:  As directed    Increase activity slowly   Complete by:  As directed      Allergies as of 10/06/2018      Reactions   Codeine Nausea And Vomiting, Other (See Comments)   Pt states when he takes codeine, he throws up blood   Niacin And Related Other (See Comments)   "made his body feel hot & he had a hard time breathing"      Medication List    TAKE these medications   ADVAIR DISKUS 500-50 MCG/DOSE Aepb Generic drug:  Fluticasone-Salmeterol Inhale 1 puff into the lungs 2 (two) times daily as needed (wheezing, sob).   ALPRAZolam 0.25 MG tablet Commonly known as:  XANAX Take 0.25 mg by mouth 3 (three) times daily.   amLODipine 10 MG tablet Commonly known as:  NORVASC Take 10 mg by mouth daily.   aspirin EC 81 MG tablet Take 81 mg by mouth daily.   atorvastatin 40 MG tablet Commonly known as:  LIPITOR Take 1 tablet (40 mg total) by mouth at bedtime. SCHEDULE OV FOR FURTHER REFILLS. 1ST ATTEMPT.   busPIRone 7.5 MG tablet Commonly known as:  BUSPAR Take 1 tablet by mouth 2 (two) times daily.    clopidogrel 75 MG tablet Commonly known as:  PLAVIX Take 1 tablet (75 mg total) by mouth daily.   COMBIVENT RESPIMAT 20-100 MCG/ACT Aers respimat Generic drug:  Ipratropium-Albuterol Inhale 1 puff into the lungs every 6 (six) hours.   ezetimibe 10 MG tablet Commonly known as:  ZETIA TAKE 1 TABLET BY MOUTH EVERY DAY   famotidine 20 MG tablet Commonly known as:  PEPCID TAKE 1 TABLET BY MOUTH DAILY.   INCRUSE ELLIPTA 62.5 MCG/INH Aepb Generic drug:  umeclidinium bromide Inhale 1 puff into the lungs daily.   isosorbide mononitrate 60 MG 24 hr tablet Commonly known as:  IMDUR TAKE 1 TABLET BY MOUTH EVERY DAY   losartan-hydrochlorothiazide 100-12.5 MG tablet Commonly known as:  HYZAAR Take 1 tablet by mouth daily.   metFORMIN 500 MG tablet Commonly known as:  GLUCOPHAGE TAKE 1 TABLET (500 MG TOTAL) BY MOUTH 2 (TWO) TIMES DAILY WITH A MEAL.   metoprolol 200 MG 24 hr tablet Commonly known as:  TOPROL-XL TAKE 0.5 TABLETS (100 MG TOTAL) BY MOUTH 2 (TWO) TIMES DAILY.   ranolazine 1000 MG SR tablet Commonly known as:  RANEXA Take 1 tablet (1,000 mg total) by mouth 2 (two) times daily.   TRELEGY ELLIPTA 100-62.5-25 MCG/INH Aepb Generic drug:  Fluticasone-Umeclidin-Vilant Inhale 1 puff into the lungs daily.      Follow-up Information    Izora Gala, MD. Schedule an appointment as soon  as possible for a visit in 2 weeks.   Specialty:  Otolaryngology Contact information: [address] [place] [address] [phone]           Signed: Izora Gala 10/06/2018, 8:36 AM

## 2018-10-28 ENCOUNTER — Other Ambulatory Visit: Payer: Self-pay | Admitting: Cardiovascular Disease

## 2018-10-30 ENCOUNTER — Ambulatory Visit (INDEPENDENT_AMBULATORY_CARE_PROVIDER_SITE_OTHER): Payer: Medicare Other | Admitting: Plastic Surgery

## 2018-10-30 ENCOUNTER — Encounter: Payer: Self-pay | Admitting: Plastic Surgery

## 2018-10-30 VITALS — BP 132/70 | HR 85 | Temp 97.8°F | Ht 68.0 in | Wt 185.0 lb

## 2018-10-30 DIAGNOSIS — F17200 Nicotine dependence, unspecified, uncomplicated: Secondary | ICD-10-CM

## 2018-10-30 DIAGNOSIS — D0321 Melanoma in situ of right ear and external auricular canal: Secondary | ICD-10-CM | POA: Diagnosis not present

## 2018-10-30 DIAGNOSIS — C4400 Unspecified malignant neoplasm of skin of lip: Secondary | ICD-10-CM

## 2018-10-30 NOTE — Progress Notes (Signed)
Patient ID: Troy [name], male    DOB: [birth year], 68 y.o.   MRN: [id number]   Chief Complaint  Patient presents with  . Advice Only    for skin cancer on the lower lip    The patient is a 68 yrs old male here for evaluation of his lower lip.  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 Constance Holster. He had resection of the lower portion of his ear and LND.  Both sites are healing well.  He has a changing skin lesion that involves the majority of his lower lip.  He has undergone resection of a lesion on his lip several times in the past.  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  He is smoking and states he is unlike to be able to stop.  He wears dentures and want to be sure that he will be able to get them in if he has any surgery.  He also has a lesion on the inner buccal side of the right lower lip.  I don't feel any lymphadenopathy at this time.  He states the lesions are getting larger and tender.   Review of Systems  Constitutional: Negative for activity change and appetite change.  HENT: Negative.  Negative for trouble swallowing and voice change.   Eyes: Negative.   Respiratory: Negative.   Cardiovascular: Negative for leg swelling.  Gastrointestinal: Negative.   Endocrine: Negative for heat intolerance.  Genitourinary: Negative.   Musculoskeletal: Negative.   Skin: Positive for wound.  Psychiatric/Behavioral: Negative.     Past Medical History:  Diagnosis Date  . Anxiety   . COPD (chronic obstructive pulmonary disease) ([place])   . Coronary artery disease 2006   Acute coronary syndrome in March 2006.   . Diabetes mellitus without complication ([place])   . Dyslipidemia   . Hypertension 10/09/2010   EF 40-45%  . 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 Sleep apnea    uses cpap machine    Past Surgical History:  Procedure Laterality Date  . Arterial evalation      no evidence of ICA stenosis  .  CARDIAC CATHETERIZATION    . CARDIAC SURGERY    . CORONARY ARTERY BYPASS GRAFT  2006   Post CABG surgery with a LIMA to the LAD, a vein to the diagonal ,,a vein to the the obtuse marginal and vein to the PDA.  [name] CORONARY STENT PLACEMENT  2010  . EAR CYST EXCISION Right 10/05/2018   Procedure: Right Partial pinnectomy;  Surgeon: Izora Gala, MD;  Location: [place];  Service: ENT;  Laterality: Right;  . GRAFT(S) ANGIOGRAM  10/21/2011   Procedure: GRAFT(S) Cyril Loosen;  Surgeon: Leonie Man, MD;  Location: [facility] CATH LAB;  Service: Cardiovascular;;  . LEFT HEART CATHETERIZATION WITH CORONARY ANGIOGRAM N/A 10/21/2011   Procedure: LEFT HEART CATHETERIZATION WITH CORONARY ANGIOGRAM;  Surgeon: Leonie Man, MD;  Location: [facility] CATH LAB;  Service: Cardiovascular;  Laterality: N/A;  . LYMPH NODE BIOPSY Right 10/05/2018   Procedure: sentinel NODE BIOPSY with right partial modified neck dissection;  Surgeon: Izora Gala, MD;  Location: [place];  Service: ENT;  Laterality: Right;      Current Outpatient Medications:  .  ADVAIR DISKUS 500-50 MCG/DOSE AEPB, Inhale 1 puff into the lungs 2 (two) times daily as needed (wheezing, sob). , Disp: , Rfl: 2 .  ALPRAZolam (XANAX) 0.25  MG tablet, Take 0.25 mg by mouth 3 (three) times daily. , Disp: , Rfl:  .  amLODipine (NORVASC) 10 MG tablet, Take 10 mg by mouth daily., Disp: , Rfl:  .  aspirin EC 81 MG tablet, Take 81 mg by mouth daily., Disp: , Rfl:  .  atorvastatin (LIPITOR) 40 MG tablet, Take 1 tablet (40 mg total) by mouth at bedtime. SCHEDULE OV FOR FURTHER REFILLS. 2nd ATTEMPT., Disp: 30 tablet, Rfl: 0 .  busPIRone (BUSPAR) 7.5 MG tablet, Take 1 tablet by mouth 2 (two) times daily., Disp: , Rfl:  .  clopidogrel (PLAVIX) 75 MG tablet, Take 1 tablet (75 mg total) by mouth daily., Disp: 90 tablet, Rfl: 2 .  ezetimibe (ZETIA) 10 MG tablet, TAKE 1 TABLET BY MOUTH EVERY DAY (Patient taking differently: Take 10 mg by mouth daily. ), Disp: 90 tablet, Rfl: 3 .  famotidine  (PEPCID) 20 MG tablet, TAKE 1 TABLET BY MOUTH DAILY. (Patient taking differently: Take 20 mg by mouth daily. ), Disp: 90 tablet, Rfl: 2 .  INCRUSE ELLIPTA 62.5 MCG/INH AEPB, Inhale 1 puff into the lungs daily. , Disp: , Rfl: 2 .  Ipratropium-Albuterol (COMBIVENT RESPIMAT) 20-100 MCG/ACT AERS respimat, Inhale 1 puff into the lungs every 6 (six) hours., Disp: , Rfl:  .  isosorbide mononitrate (IMDUR) 60 MG 24 hr tablet, TAKE 1 TABLET BY MOUTH EVERY DAY, Disp: 90 tablet, Rfl: 1 .  losartan-hydrochlorothiazide (HYZAAR) 100-12.5 MG tablet, Take 1 tablet by mouth daily., Disp: 30 tablet, Rfl: 10 .  metFORMIN (GLUCOPHAGE) 500 MG tablet, TAKE 1 TABLET (500 MG TOTAL) BY MOUTH 2 (TWO) TIMES DAILY WITH A MEAL., Disp: 180 tablet, Rfl: 0 .  metoprolol (TOPROL-XL) 200 MG 24 hr tablet, TAKE 0.5 TABLETS (100 MG TOTAL) BY MOUTH 2 (TWO) TIMES DAILY., Disp: 30 tablet, Rfl: 6 .  ranolazine (RANEXA) 1000 MG SR tablet, Take 1 tablet (1,000 mg total) by mouth 2 (two) times daily., Disp: 60 tablet, Rfl: 4 .  TRELEGY ELLIPTA 100-62.5-25 MCG/INH AEPB, Inhale 1 puff into the lungs daily., Disp: , Rfl: 3   Objective:   Vitals:   10/30/18 1351  BP: 132/70  Pulse: 85  Temp: 97.8 F (36.6 C)  SpO2: 97%    Physical Exam Vitals signs and nursing note reviewed.  Constitutional:      Appearance: Normal appearance.  HENT:     Head: Normocephalic.      Left Ear: External ear normal.     Nose: Nose normal.     Mouth/Throat:     Mouth: Mucous membranes are moist.   Eyes:     Extraocular Movements: Extraocular movements intact.     Pupils: Pupils are equal, round, and reactive to light.  Cardiovascular:     Rate and Rhythm: Normal rate.  Pulmonary:     Effort: Pulmonary effort is normal.  Abdominal:     General: Abdomen is flat. There is no distension.     Tenderness: There is no abdominal tenderness.  Skin:    General: Skin is warm.  Neurological:     General: No focal deficit present.     Mental Status: He  is alert.  Psychiatric:        Mood and Affect: Mood normal.        Thought Content: Thought content normal.        Judgment: Judgment normal.     Assessment & Plan:  Melanoma in situ of ear, right (HCC)  Smoking  Skin cancer of lip  I have requested the notes from Dr. Constance Holster and dermatology. I need to see the path results and know what was biopsied.  If the lip margins were not we may need to do that to see the extent of the involvement.  He may need a free flap if the lesion is full thickness invades the muscle. I also called the patient after the exam and reviewed the plan. I spoke with Dr. Constance Holster concerning all of the above and we will make a plan after reviewing the biopsy results. I don't see any recent CT or MRI of the head or neck.  This may be needed.   [place], DO

## 2018-11-01 DIAGNOSIS — C4400 Unspecified malignant neoplasm of skin of lip: Secondary | ICD-10-CM | POA: Insufficient documentation

## 2019-03-10 ENCOUNTER — Other Ambulatory Visit: Payer: Self-pay | Admitting: Cardiovascular Disease

## 2019-03-18 ENCOUNTER — Other Ambulatory Visit: Payer: Self-pay | Admitting: Cardiovascular Disease

## 2019-03-25 DIAGNOSIS — C001 Malignant neoplasm of external lower lip: Secondary | ICD-10-CM | POA: Diagnosis not present

## 2019-03-25 DIAGNOSIS — C4321 Malignant melanoma of right ear and external auricular canal: Secondary | ICD-10-CM | POA: Diagnosis not present

## 2019-03-30 ENCOUNTER — Other Ambulatory Visit: Payer: Self-pay

## 2019-03-30 ENCOUNTER — Encounter: Payer: Self-pay | Admitting: Plastic Surgery

## 2019-03-30 ENCOUNTER — Ambulatory Visit (INDEPENDENT_AMBULATORY_CARE_PROVIDER_SITE_OTHER): Payer: Medicare Other | Admitting: Plastic Surgery

## 2019-03-30 VITALS — BP 135/71 | HR 90 | Temp 98.5°F | Ht 68.0 in | Wt 181.8 lb

## 2019-03-30 DIAGNOSIS — C4400 Unspecified malignant neoplasm of skin of lip: Secondary | ICD-10-CM

## 2019-03-31 ENCOUNTER — Encounter: Payer: Self-pay | Admitting: Plastic Surgery

## 2019-03-31 NOTE — Progress Notes (Signed)
   Subjective:    Patient ID: Troy [name], male    DOB: 1951-10-02, 68 y.o.   MRN: [id number]  The patient is a 68 yrs old wm here with his wife for further discussion on his lower lip skin cancer.  He states that it is very sore and involves his inner mucosa.  It appears to involve the vast majority of his lower lip.  He has decreased his smoking from 3 ppd to 2 ppd.  He has undergone resection of lower lip cancer several times on the lower right side.  This has created a narrow oral commissure and webbing on the right side.  He wears dentures and wants to be able to continue.  There are several ulcerations of the lower lip and it is tender to palpation.  No cervical lymphadenopathy noted.    Review of Systems  Constitutional: Negative for activity change and appetite change.  HENT: Positive for mouth sores.   Eyes: Negative.   Respiratory: Negative for chest tightness and shortness of breath.   Cardiovascular: Negative.   Endocrine: Negative.   Genitourinary: Negative.   Skin: Positive for wound.  Hematological: Negative.   Psychiatric/Behavioral: Negative.        Objective:   Physical Exam Vitals signs and nursing note reviewed.  HENT:     Head: Normocephalic and atraumatic.     Mouth/Throat:   Eyes:     Extraocular Movements: Extraocular movements intact.     Pupils: Pupils are equal, round, and reactive to light.  Cardiovascular:     Rate and Rhythm: Normal rate.  Pulmonary:     Effort: Pulmonary effort is normal. No respiratory distress.  Abdominal:     General: Abdomen is flat. There is no distension.     Tenderness: There is no abdominal tenderness.  Skin:    General: Skin is warm.  Neurological:     General: No focal deficit present.     Mental Status: He is alert and oriented to person, place, and time.  Psychiatric:        Mood and Affect: Mood normal.        Thought Content: Thought content normal.         Assessment & Plan:     ICD-10-CM   1. Skin  cancer of lip  C44.00     Recommend tertiary care setting as a free flap may be necessary to accomplish all his goals.  He does not want to leave the city.  I have discussed all of this information with Dr.Rosen.  I asked patient to think about the options and I will call him to discuss this further.  Pictures were obtained of the patient and placed in the chart with the patient's or guardian's permission.  03/31/19 I spoke with the patient this evening about going to [facility] for the resection and a free flap.  This is a possibility as he may not have enough local tissue for reconstruction.  The other option is to have the resection and possibly a lip switch if we are short on available tissue.  This would limit his oral opening and likely inhibit him from using his dentures.  One of his main goals is getting his dentures in.  He is going to talk to his wife about it and let me know on Friday his decision.

## 2019-04-10 ENCOUNTER — Other Ambulatory Visit: Payer: Self-pay | Admitting: Cardiovascular Disease

## 2019-04-13 DIAGNOSIS — I10 Essential (primary) hypertension: Secondary | ICD-10-CM | POA: Diagnosis not present

## 2019-04-13 DIAGNOSIS — E119 Type 2 diabetes mellitus without complications: Secondary | ICD-10-CM | POA: Diagnosis not present

## 2019-04-15 ENCOUNTER — Other Ambulatory Visit: Payer: Self-pay | Admitting: Cardiovascular Disease

## 2019-04-22 ENCOUNTER — Other Ambulatory Visit: Payer: Self-pay | Admitting: Cardiovascular Disease

## 2019-04-23 NOTE — Telephone Encounter (Signed)
Rx(s) sent to pharmacy electronically.  

## 2019-04-25 ENCOUNTER — Other Ambulatory Visit: Payer: Self-pay | Admitting: Cardiovascular Disease

## 2019-05-04 ENCOUNTER — Ambulatory Visit (INDEPENDENT_AMBULATORY_CARE_PROVIDER_SITE_OTHER): Payer: Medicare Other | Admitting: Nurse Practitioner

## 2019-05-04 ENCOUNTER — Encounter: Payer: Self-pay | Admitting: Plastic Surgery

## 2019-05-04 ENCOUNTER — Other Ambulatory Visit: Payer: Self-pay

## 2019-05-04 VITALS — BP 123/67 | HR 82 | Temp 98.2°F | Ht 68.0 in | Wt 180.8 lb

## 2019-05-04 DIAGNOSIS — C4400 Unspecified malignant neoplasm of skin of lip: Secondary | ICD-10-CM

## 2019-05-04 DIAGNOSIS — Z951 Presence of aortocoronary bypass graft: Secondary | ICD-10-CM | POA: Diagnosis not present

## 2019-05-04 DIAGNOSIS — F17200 Nicotine dependence, unspecified, uncomplicated: Secondary | ICD-10-CM

## 2019-05-04 DIAGNOSIS — J439 Emphysema, unspecified: Secondary | ICD-10-CM | POA: Diagnosis not present

## 2019-05-04 NOTE — Progress Notes (Addendum)
Patient ID: Troy [name], male    DOB: [birth year], 68 y.o.   MRN: [id number]   Chief Complaint  Patient presents with   Follow-up    on skin cancer of lip   Troy [name] is a 68 yo male here with his wife for further discussion on his lower lip skin cancer. He was referred to Dr. Chalmers Cater at [facility] last month. Patient states [name] canceled the appointment because they "didn't do that kind of surgery." Patient continues to have pain on his lip, mostly in the right corner. He now has sores on the inside right corner of the lip. He has pain with eating. He uses several different types of ointments on his lip. He is still smoking 2 ppd. He is very concerned about being able to use his dentures after surgery.   Review of Systems  Constitutional: Negative.   HENT: Positive for mouth sores.   Eyes: Negative.   Respiratory: Positive for cough.        Cough baseline  Cardiovascular: Negative.   Gastrointestinal: Negative.   Genitourinary: Negative.   Musculoskeletal: Negative.   Skin: Positive for wound.  Hematological: Negative.   Psychiatric/Behavioral:       Trouble sleeping    Past Medical History:  Diagnosis Date   Anxiety    COPD (chronic obstructive pulmonary disease) ([place])    Coronary artery disease 2006   Acute coronary syndrome in March 2006.    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   CORONARY STENT PLACEMENT  2010   EAR CYST EXCISION Right 10/05/2018   Procedure: Right Partial pinnectomy;   Surgeon: Izora Gala, MD;  Location: [place];  Service: ENT;  Laterality: Right;   GRAFT(S) ANGIOGRAM  10/21/2011   Procedure: GRAFT(S) Cyril Loosen;  Surgeon: Leonie Man, MD;  Location: [facility] CATH LAB;  Service: Cardiovascular;;   LEFT HEART CATHETERIZATION WITH CORONARY ANGIOGRAM N/A 10/21/2011   Procedure: LEFT HEART CATHETERIZATION WITH CORONARY ANGIOGRAM;  Surgeon: Leonie Man, MD;  Location: [facility] CATH LAB;  Service: Cardiovascular;  Laterality: N/A;   LYMPH NODE BIOPSY Right 10/05/2018   Procedure: sentinel NODE BIOPSY with right partial modified neck dissection;  Surgeon: Izora Gala, MD;  Location: [facility] OR;  Service: ENT;  Laterality: Right;      Current Outpatient Medications:    ADVAIR DISKUS 500-50 MCG/DOSE AEPB, Inhale 1 puff into the lungs 2 (two) times daily as needed (wheezing, sob). , Disp: , Rfl: 2   ALPRAZolam (XANAX) 0.25 MG tablet, Take 0.25 mg by mouth 3 (three) times daily. , Disp: , Rfl:    amLODipine (NORVASC) 10 MG tablet, Take 10 mg by mouth daily., Disp: , Rfl:    aspirin EC 81 MG tablet, Take 81 mg by mouth daily., Disp: , Rfl:    atorvastatin (LIPITOR) 40 MG tablet, Take 1 tablet (40 mg total) by mouth at bedtime.  SCHEDULE OV FOR FURTHER REFILLS. 3rd ATTEMPT., Disp: 15 tablet, Rfl: 0   busPIRone (BUSPAR) 7.5 MG tablet, Take 1 tablet by mouth 2 (two) times daily., Disp: , Rfl:    ezetimibe (ZETIA) 10 MG tablet, TAKE 1 TABLET BY MOUTH EVERY DAY (Patient taking differently: Take 10 mg by mouth daily. ), Disp: 90 tablet, Rfl: 3   famotidine (PEPCID) 20 MG tablet, Take 1 tablet (20 mg total) by mouth daily. NEEDS APPOINTMENT FOR FUTURE REFILLS, Disp: 30 tablet, Rfl: 0   INCRUSE ELLIPTA 62.5 MCG/INH AEPB, Inhale 1 puff into the lungs daily. , Disp: , Rfl: 2   Ipratropium-Albuterol (COMBIVENT RESPIMAT) 20-100 MCG/ACT AERS respimat, Inhale 1 puff into the lungs every 6 (six) hours., Disp: , Rfl:    isosorbide mononitrate (IMDUR) 60 MG 24 hr tablet, TAKE 1 TABLET BY  MOUTH EVERY DAY, Disp: 90 tablet, Rfl: 1   losartan-hydrochlorothiazide (HYZAAR) 100-12.5 MG tablet, Take 1 tablet by mouth daily., Disp: 30 tablet, Rfl: 10   metFORMIN (GLUCOPHAGE) 500 MG tablet, TAKE 1 TABLET (500 MG TOTAL) BY MOUTH 2 (TWO) TIMES DAILY WITH A MEAL., Disp: 180 tablet, Rfl: 0   metoprolol (TOPROL-XL) 200 MG 24 hr tablet, TAKE 0.5 TABLETS (100 MG TOTAL) BY MOUTH 2 (TWO) TIMES DAILY., Disp: 30 tablet, Rfl: 6   ranolazine (RANEXA) 1000 MG SR tablet, Take 1 tablet (1,000 mg total) by mouth 2 (two) times daily., Disp: 60 tablet, Rfl: 4   TRELEGY ELLIPTA 100-62.5-25 MCG/INH AEPB, Inhale 1 puff into the lungs daily., Disp: , Rfl: 3   clopidogrel (PLAVIX) 75 MG tablet, Take 1 tablet (75 mg total) by mouth daily., Disp: 90 tablet, Rfl: 2   Objective:   Vitals:   05/04/19 1305  BP: 123/67  Pulse: 82  Temp: 98.2 F (36.8 C)  SpO2: 98%    Physical Exam  General: alert, calm, no acute distress HEENT: multiple scabbed nodules along entire outer lower lip, few open sores at corner of right lower lip and within inner mucosa Chest: symmetrical rise and fall Lungs: unlabored breathing Cardiac: +2 bilateral radial pulse Abdomen: soft, rounded, non-tender Musculoskeletal: MAEx4 Neuro: A&O x3, calm, cooperative, steady gait   Assessment & Plan:  Troy [name] is a 68 yo male with skin cancer of the lower lip. Due to the extend of the skin cancer, he will likely need a free flap. This would be best performed in a tertiary care setting. Will place a new referral to [facility]. Patient was counseled on the importance of decreasing his smoking habit as much as possible. Discussed the risk of impaired wound healing with hx of smoking and diabetes. Advised patient to attempt eating healthy diet as much as possible.     Emmamarie Kluender Dozier-Lineberger, NP   I placed a call to [facility] and have arranged for a consultation with Dr. Lynnell Jude [phone]

## 2019-05-06 ENCOUNTER — Other Ambulatory Visit: Payer: Self-pay | Admitting: Cardiovascular Disease

## 2019-05-17 ENCOUNTER — Other Ambulatory Visit: Payer: Self-pay | Admitting: Cardiovascular Disease

## 2019-05-21 DIAGNOSIS — Z885 Allergy status to narcotic agent status: Secondary | ICD-10-CM | POA: Diagnosis not present

## 2019-05-21 DIAGNOSIS — E785 Hyperlipidemia, unspecified: Secondary | ICD-10-CM | POA: Diagnosis not present

## 2019-05-21 DIAGNOSIS — D0321 Melanoma in situ of right ear and external auricular canal: Secondary | ICD-10-CM | POA: Diagnosis not present

## 2019-05-21 DIAGNOSIS — Z01818 Encounter for other preprocedural examination: Secondary | ICD-10-CM | POA: Diagnosis not present

## 2019-05-21 DIAGNOSIS — I255 Ischemic cardiomyopathy: Secondary | ICD-10-CM | POA: Diagnosis not present

## 2019-05-21 DIAGNOSIS — I252 Old myocardial infarction: Secondary | ICD-10-CM | POA: Diagnosis not present

## 2019-05-21 DIAGNOSIS — C009 Malignant neoplasm of lip, unspecified: Secondary | ICD-10-CM | POA: Diagnosis not present

## 2019-05-21 DIAGNOSIS — Z951 Presence of aortocoronary bypass graft: Secondary | ICD-10-CM | POA: Diagnosis not present

## 2019-05-21 DIAGNOSIS — Q185 Microstomia: Secondary | ICD-10-CM | POA: Diagnosis not present

## 2019-05-21 DIAGNOSIS — D499 Neoplasm of unspecified behavior of unspecified site: Secondary | ICD-10-CM | POA: Diagnosis not present

## 2019-05-21 DIAGNOSIS — J439 Emphysema, unspecified: Secondary | ICD-10-CM | POA: Diagnosis not present

## 2019-05-21 DIAGNOSIS — Z7982 Long term (current) use of aspirin: Secondary | ICD-10-CM | POA: Diagnosis not present

## 2019-05-21 DIAGNOSIS — I10 Essential (primary) hypertension: Secondary | ICD-10-CM | POA: Diagnosis not present

## 2019-05-23 ENCOUNTER — Other Ambulatory Visit: Payer: Self-pay | Admitting: Cardiovascular Disease

## 2019-05-24 NOTE — Telephone Encounter (Signed)
Please advise if ok to refill Pepcid 20 mg tablet qd.

## 2019-05-25 ENCOUNTER — Telehealth: Payer: Self-pay

## 2019-05-25 NOTE — Telephone Encounter (Signed)
   Primary Cardiologist:Thomas [name], MD  Chart reviewed as part of pre-operative protocol coverage. Because of Harry Shuck Dokes's past medical history and time since last visit, he/she will require a follow-up visit in order to better assess preoperative cardiovascular risk.  Pre-op covering staff: - Please schedule appointment and call patient to inform them. - Please contact requesting surgeon's office via preferred method (i.e, phone, fax) to inform them of need for appointment prior to surgery.  If applicable, this message will also be routed to pharmacy pool and/or primary cardiologist for input on holding anticoagulant/antiplatelet agent as requested below so that this information is available at time of patient's appointment.   Ledora Bottcher, PA  05/25/2019, 2:33 PM

## 2019-05-25 NOTE — Telephone Encounter (Signed)
   [facility] Pre-operative Risk Assessment    Request for surgical clearance:  1. What type of surgery is being performed? Lip Resection, Neck dissection with free flap   2. When is this surgery scheduled? TBD   3. What type of clearance is required (medical clearance vs. Pharmacy clearance to hold med vs. Both)? Both  4. Are there any medications that need to be held prior to surgery and how long?  Aspirin/Plavix  5. Practice name and name of physician performing surgery? [facility] Department of Otolaryngology head and neck surgery - Dr.Patel  6. What is your office phone number? [phone]    7.   What is your office fax number [phone]  8.   Anesthesia type (None, local, MAC, general) ? general   Troy [name] 05/25/2019, 10:34 AM  _________________________________________________________________   (provider comments below)

## 2019-05-25 NOTE — Telephone Encounter (Signed)
Faxed message to requesting office that pt will need appt prior to clearance. Appt scheduled on 8/21 with Almyra Deforest, PA for clearance. Called and informed pt who verbalized understanding and thanks.

## 2019-05-26 NOTE — Telephone Encounter (Signed)
Received another faxed request for clearance. Faxed again indicating that pt has appt for clearance

## 2019-05-28 DIAGNOSIS — E119 Type 2 diabetes mellitus without complications: Secondary | ICD-10-CM | POA: Diagnosis not present

## 2019-05-28 DIAGNOSIS — D499 Neoplasm of unspecified behavior of unspecified site: Secondary | ICD-10-CM | POA: Diagnosis not present

## 2019-05-28 DIAGNOSIS — I429 Cardiomyopathy, unspecified: Secondary | ICD-10-CM | POA: Diagnosis not present

## 2019-05-28 DIAGNOSIS — R911 Solitary pulmonary nodule: Secondary | ICD-10-CM | POA: Diagnosis not present

## 2019-05-28 DIAGNOSIS — I6523 Occlusion and stenosis of bilateral carotid arteries: Secondary | ICD-10-CM | POA: Diagnosis not present

## 2019-05-28 DIAGNOSIS — I2510 Atherosclerotic heart disease of native coronary artery without angina pectoris: Secondary | ICD-10-CM | POA: Diagnosis not present

## 2019-05-28 DIAGNOSIS — Q185 Microstomia: Secondary | ICD-10-CM | POA: Diagnosis not present

## 2019-05-28 DIAGNOSIS — I252 Old myocardial infarction: Secondary | ICD-10-CM | POA: Diagnosis not present

## 2019-05-28 DIAGNOSIS — I255 Ischemic cardiomyopathy: Secondary | ICD-10-CM | POA: Diagnosis not present

## 2019-05-28 DIAGNOSIS — Z01818 Encounter for other preprocedural examination: Secondary | ICD-10-CM | POA: Diagnosis not present

## 2019-05-28 DIAGNOSIS — J439 Emphysema, unspecified: Secondary | ICD-10-CM | POA: Diagnosis not present

## 2019-05-28 DIAGNOSIS — C009 Malignant neoplasm of lip, unspecified: Secondary | ICD-10-CM | POA: Diagnosis not present

## 2019-05-28 DIAGNOSIS — I6521 Occlusion and stenosis of right carotid artery: Secondary | ICD-10-CM | POA: Diagnosis not present

## 2019-05-28 DIAGNOSIS — R918 Other nonspecific abnormal finding of lung field: Secondary | ICD-10-CM | POA: Diagnosis not present

## 2019-05-28 DIAGNOSIS — Z79899 Other long term (current) drug therapy: Secondary | ICD-10-CM | POA: Diagnosis not present

## 2019-06-03 ENCOUNTER — Ambulatory Visit (INDEPENDENT_AMBULATORY_CARE_PROVIDER_SITE_OTHER): Payer: Medicare Other | Admitting: Internal Medicine

## 2019-06-03 ENCOUNTER — Encounter: Payer: Self-pay | Admitting: Internal Medicine

## 2019-06-03 ENCOUNTER — Other Ambulatory Visit: Payer: Self-pay

## 2019-06-03 VITALS — BP 98/58 | HR 85 | Resp 16 | Ht 66.0 in | Wt 182.0 lb

## 2019-06-03 DIAGNOSIS — R0602 Shortness of breath: Secondary | ICD-10-CM

## 2019-06-03 DIAGNOSIS — R911 Solitary pulmonary nodule: Secondary | ICD-10-CM | POA: Diagnosis not present

## 2019-06-03 DIAGNOSIS — I2510 Atherosclerotic heart disease of native coronary artery without angina pectoris: Secondary | ICD-10-CM

## 2019-06-03 DIAGNOSIS — C4400 Unspecified malignant neoplasm of skin of lip: Secondary | ICD-10-CM

## 2019-06-03 DIAGNOSIS — J449 Chronic obstructive pulmonary disease, unspecified: Secondary | ICD-10-CM

## 2019-06-03 DIAGNOSIS — I2583 Coronary atherosclerosis due to lipid rich plaque: Secondary | ICD-10-CM

## 2019-06-03 DIAGNOSIS — F17200 Nicotine dependence, unspecified, uncomplicated: Secondary | ICD-10-CM

## 2019-06-03 NOTE — Patient Instructions (Signed)
Pulmonary Nodule A pulmonary nodule is tissue that has grown on your lung. A nodule may be cancer, but most nodules are not cancer. Follow these instructions at home:   Take over-the-counter and prescription medicines only as told by your doctor.  Do not use any products that have nicotine or tobacco, such as cigarettes and e-cigarettes. If you need help quitting, ask your doctor.  Keep all follow-up visits as told by your doctor. This is important. Contact a doctor if:  You have trouble breathing when doing activities.  You feel sick.  You feel more tired than normal.  You do not feel like eating.  You lose weight without trying.  You have chills.  You have night sweats. Get help right away if:  You cannot catch your breath.  You start making whistling sounds when breathing (wheezing).  You cannot stop coughing.  You cough up blood.  You get dizzy.  You feel like you are going to pass out (faint).  You have sudden chest pain.  You have a fever or symptoms for more than 2-3 days.  You have a fever and your symptoms suddenly get worse. Summary  A pulmonary nodule is tissue that has grown on your lung.  Most nodules are not cancer.  Your doctor will do tests to know what kind of nodule you have, and whether you need treatment for it. This information is not intended to replace advice given to you by your health care provider. Make sure you discuss any questions you have with your health care provider. Document Released: 11/02/2010 Document Revised: 10/24/2017 Document Reviewed: 10/29/2016 Elsevier Patient Education  2020 [employer].

## 2019-06-03 NOTE — Progress Notes (Signed)
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  The patient also had a CT scan done of the chest and this showed a solitary pulmonary nodule which is highly suspicious for malignancy in the right lung.  Patient is still smoking he is now smoking about 2 packs a day.  He has been counseled numerous times on smoking cessation.  He was referred back to [place] for further evaluation of the pulmonary nodule.  He has not had a PET scan so therefore this will be needed.  Patient denies having any hemoptysis.  He denies having any chest pain.  He has no longer.  And has already met he is to smoking excellently 2 packs/day.  The location of the pulmonary nodule is peripheral close to the rib so therefore it may be easily accessible by a CT needle.  ROS  General: (-) fever, (-) chills, (-) night sweats, (-) weakness Skin: (-) rashes, (-) itching,. Eyes: (-) visual changes, (-) redness, (-) itching. Nose and Sinuses: (-) nasal stuffiness or itchiness, (-) postnasal drip, (-) nosebleeds, (-) sinus trouble. Mouth and Throat: (-) sore throat, (-) hoarseness. Neck: (-) swollen glands, (-) enlarged thyroid, (-) neck pain. Respiratory: + cough, (-) bloody sputum, + shortness of breath, + wheezing. Cardiovascular: - ankle swelling, (-) chest pain. Lymphatic: (-) lymph node enlargement. Neurologic: (-) numbness, (-) tingling. Psychiatric: (-) anxiety, (-) depression   Current Medication: Outpatient Encounter Medications as of 06/03/2019  Medication Sig  . ADVAIR DISKUS 500-50 MCG/DOSE AEPB Inhale 1 puff into the lungs 2 (two) times daily as needed (wheezing, sob).   . ALPRAZolam (XANAX) 0.25 MG tablet Take 0.25 mg by mouth 3 (three) times daily.    [name] amLODipine (NORVASC) 10 MG tablet Take 10 mg by mouth daily.  [name] aspirin EC 81 MG tablet Take 81 mg by mouth daily.  [name] atorvastatin (LIPITOR) 40 MG tablet Take 1 tablet (40 mg total) by mouth at bedtime. Keep October Appointment  . busPIRone (BUSPAR) 7.5 MG tablet Take 1 tablet by mouth 2 (two) times daily.  . clopidogrel (PLAVIX) 75 MG tablet Take 1 tablet (75 mg total) by mouth daily.  [name] ezetimibe (ZETIA) 10 MG tablet TAKE 1 TABLET BY MOUTH EVERY DAY (Patient taking differently: Take 10 mg by mouth daily. )  . famotidine (PEPCID) 20 MG tablet TAKE 1 TABLET (20 MG TOTAL) BY MOUTH DAILY. NEEDS APPOINTMENT FOR FUTURE REFILLS  . INCRUSE ELLIPTA 62.5 MCG/INH AEPB Inhale 1 puff into the lungs daily.   . Ipratropium-Albuterol (COMBIVENT RESPIMAT) 20-100 MCG/ACT AERS respimat Inhale 1 puff into the lungs every 6 (six) hours.  . isosorbide mononitrate (IMDUR) 60 MG 24 hr tablet TAKE 1 TABLET BY MOUTH EVERY DAY  . losartan-hydrochlorothiazide (HYZAAR) 100-12.5 MG tablet Take 1 tablet by mouth daily.  . metFORMIN (GLUCOPHAGE) 500 MG tablet TAKE 1 TABLET (500 MG TOTAL) BY MOUTH 2 (TWO) TIMES DAILY WITH A MEAL.  . metoprolol (TOPROL-XL) 200 MG 24 hr tablet TAKE 0.5 TABLETS (100 MG TOTAL) BY MOUTH 2 (TWO) TIMES DAILY.  . ranolazine (RANEXA) 1000 MG SR tablet Take 1 tablet (1,000 mg total) by mouth 2 (two) times daily.  . TRELEGY ELLIPTA 100-62.5-25 MCG/INH AEPB Inhale 1 puff into the lungs daily.  No facility-administered encounter medications on file as of 06/03/2019.     Surgical History: Past Surgical History:  Procedure Laterality Date  . Arterial evalation      no evidence of ICA stenosis  . CARDIAC CATHETERIZATION    . CARDIAC SURGERY    . CORONARY ARTERY BYPASS GRAFT  2006   Post CABG surgery with a LIMA to the LAD, a vein to the diagonal ,,a vein to the the obtuse marginal and vein to the PDA.  [name] CORONARY STENT PLACEMENT  2010  . EAR CYST EXCISION Right 10/05/2018   Procedure: Right  Partial pinnectomy;  Surgeon: Izora Gala, MD;  Location: [facility];  Service: ENT;  Laterality: Right;  . GRAFT(S) ANGIOGRAM  10/21/2011   Procedure: GRAFT(S) Cyril Loosen;  Surgeon: Leonie Man, MD;  Location: [facility] CATH LAB;  Service: Cardiovascular;;  . LEFT HEART CATHETERIZATION WITH CORONARY ANGIOGRAM N/A 10/21/2011   Procedure: LEFT HEART CATHETERIZATION WITH CORONARY ANGIOGRAM;  Surgeon: Leonie Man, MD;  Location: [facility] CATH LAB;  Service: Cardiovascular;  Laterality: N/A;  . LYMPH NODE BIOPSY Right 10/05/2018   Procedure: sentinel NODE BIOPSY with right partial modified neck dissection;  Surgeon: Izora Gala, MD;  Location: [facility];  Service: ENT;  Laterality: Right;    Medical History: Past Medical History:  Diagnosis Date  . Anxiety   . COPD (chronic obstructive pulmonary disease) ([place])   . Coronary artery disease 2006   Acute coronary syndrome in March 2006.   . Diabetes mellitus without complication ([place])   . Dyslipidemia   . Hypertension 10/09/2010   EF 40-45%  . 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 Sleep apnea    uses cpap machine    Family History: Family History  Problem Relation Age of Onset  . Hypertension Mother   . Heart attack Father 44    Social History: Social History   Socioeconomic History  . Marital status: Married    Spouse name: Not on file  . Number of children: Not on file  . Years of education: Not on file  . Highest education level: Not on file  Occupational History  . Not on file  Social Needs  . Financial resource strain: Not on file  . Food insecurity    Worry: Not on file    Inability: Not on file  . Transportation needs    Medical: Not on file    Non-medical: Not on file  Tobacco Use  . Smoking status: Current Every Day Smoker    Packs/day: 2.00    Years: 50.00    Pack years: 100.00    Types: Cigarettes  . Smokeless tobacco: Former Systems developer    Types: [place] date: 01/23/2013  Substance and Sexual Activity  . Alcohol  use: No  . Drug use: Not Currently  . Sexual activity: Not on file  Lifestyle  . Physical activity    Days per week: Not on file    Minutes per session: Not on file  . Stress: Not on file  Relationships  . Social Herbalist on phone: Not on file    Gets together: Not on file    Attends religious service: Not on file    Active member of club or organization: Not on file    Attends meetings of clubs or organizations: Not on file    Relationship status: Not on file  . Intimate partner violence    Fear of current or  ex partner: Not on file    Emotionally abused: Not on file    Physically abused: Not on file    Forced sexual activity: Not on file  Other Topics Concern  . Not on file  Social History Narrative  . Not on file    Vital Signs: Blood pressure (!) 98/58, pulse 85, resp. rate 16, height _0  (1.676 m), weight 182 lb (82.6 kg), SpO2 98 %.  Examination: General Appearance: The patient is well-developed, well-nourished, and in no distress. Skin: Gross inspection of skin unremarkable. Head: normocephalic, no gross deformities. Eyes: no gross deformities noted. ENT: ears appear grossly normal no exudates. Neck: Supple. No thyromegaly. No LAD. Respiratory: scattered rhonchi. Cardiovascular: Normal S1 and S2 without murmur or rub. Extremities: No cyanosis. pulses are equal. Neurologic: Alert and oriented. No involuntary movements.  LABS: No results found for this or any previous visit (from the past 2160 hour(s)).  Radiology: No results found.  No results found.  No results found.    Assessment and Plan: Patient Active Problem List   Diagnosis Date Noted  . 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 Melanoma in situ of ear, right ([place]) 10/05/2018  . Obesity (BMI 30.0-34.9) 09/05/2014  . Smoking 10/21/2011  . Unstable angina ([place]) 10/18/2011  . S/P CABG x 4, 2006.PCI 11/10, cath this adm- Med Rx 10/18/2011  . HTN (hypertension) 10/18/2011  . Dyslipidemia  10/18/2011  . COPD (chronic obstructive pulmonary disease) ([place]) 10/18/2011  . Cardiomyopathy, ischemic, EF 40-45% 2D 11/10 10/18/2011    1. Pulmonary Nodule as part of the work-up we will order a head CT at this time to determine if the nodule lights out.  This will also help with access.  Patient also was strongly counseled on smoking cessation once again.  My concern is this lesion is suspicious for malignancy however we need to determine whether this is a second primary or related to the underlying lip cancer. 2. Skin Cancer lip he is going to be followed up by [facility] after our work-up is complete 3. Smoker smoking cessation discussed at length with him again.  Patient states that he is going to try to quit smoking 4. COPD patient has had a severe disease with FEV1 of 1.4 L roughly 47% of predicted on today's spirometry.  Complete pulmonary function will also be ordered to also assess the DLCO. 5. CAD he is supposed to go see the cardiologist tomorrow.  General Counseling: I have discussed the findings of the evaluation and examination with Jeneen Rinks.  I have also discussed any further diagnostic evaluation thatmay be needed or ordered today. Cecilio verbalizes understanding of the findings of todays visit. We also reviewed his medications today and discussed drug interactions and side effects including but not limited excessive drowsiness and altered mental states. We also discussed that there is always a risk not just to him but also people around him. he has been encouraged to call the office with any questions or concerns that should arise related to todays visit.  Case was discussed at length and in detail procedure explained to patient is family was present in the room.    Time spent: 25 minutes  I have personally obtained a history, examined the patient, evaluated laboratory and imaging results, formulated the assessment and plan and placed orders.    Allyne Gee, MD [facility] Pulmonary and  Critical Care Sleep medicine

## 2019-06-04 ENCOUNTER — Ambulatory Visit (INDEPENDENT_AMBULATORY_CARE_PROVIDER_SITE_OTHER): Payer: Medicare Other | Admitting: Physician Assistant

## 2019-06-04 ENCOUNTER — Encounter: Payer: Self-pay | Admitting: Physician Assistant

## 2019-06-04 VITALS — BP 106/70 | HR 100 | Temp 98.2°F | Ht 68.0 in | Wt 181.0 lb

## 2019-06-04 DIAGNOSIS — I10 Essential (primary) hypertension: Secondary | ICD-10-CM | POA: Diagnosis not present

## 2019-06-04 DIAGNOSIS — E785 Hyperlipidemia, unspecified: Secondary | ICD-10-CM

## 2019-06-04 DIAGNOSIS — G4733 Obstructive sleep apnea (adult) (pediatric): Secondary | ICD-10-CM

## 2019-06-04 DIAGNOSIS — Z01810 Encounter for preprocedural cardiovascular examination: Secondary | ICD-10-CM | POA: Diagnosis not present

## 2019-06-04 DIAGNOSIS — Z720 Tobacco use: Secondary | ICD-10-CM

## 2019-06-04 DIAGNOSIS — I25709 Atherosclerosis of coronary artery bypass graft(s), unspecified, with unspecified angina pectoris: Secondary | ICD-10-CM

## 2019-06-04 DIAGNOSIS — J449 Chronic obstructive pulmonary disease, unspecified: Secondary | ICD-10-CM

## 2019-06-04 DIAGNOSIS — R0789 Other chest pain: Secondary | ICD-10-CM | POA: Diagnosis not present

## 2019-06-04 NOTE — Patient Instructions (Signed)
Medication Instructions:  Your physician recommends that you continue on your current medications as directed. Please refer to the Current Medication list given to you today.  If you need a refill on your cardiac medications before your next appointment, please call your pharmacy.   Lab work: NONE ordered at this time of appointment   If you have labs (blood work) drawn today and your tests are completely normal, you will receive your results only by: [name] MyChart Message (if you have MyChart) OR . A paper copy in the mail If you have any lab test that is abnormal or we need to change your treatment, we will call you to review the results.  Testing/Procedures: Your physician has requested that you have a lexiscan myoview. For further information please visit [email or web address]. Please follow instruction sheet, as given.  Please schedule for 1-2 weeks   Follow-Up: At [facility], you and your health needs are our priority.  As part of our continuing mission to provide you with exceptional heart care, we have created designated Provider Care Teams.  These Care Teams include your primary Cardiologist (physician) and Advanced Practice Providers (APPs -  Physician Assistants and Nurse Practitioners) who all work together to provide you with the care you need, when you need it. You will need a follow up appointment in 6 months.  Please call our office 2 months in advance to schedule this appointment.  You may see [name], MD or one of the following Advanced Practice Providers on your designated Care Team: [place], [place] . Fabian Sharp, PA-C  Any Other Special Instructions Will Be Listed Below (If Applicable).

## 2019-06-04 NOTE — Progress Notes (Signed)
Cardiology Office Note    Date:  06/06/2019   ID:  Troy [name], Troy [name] 1951-01-12, MRN [id number]  PCP:  [name], MD  Cardiologist:  Dr. [name]  Chief Complaint  Patient presents with   Follow-up    seen for Dr. [name].    History of Present Illness:  Troy [name] is a 68 y.o. male with past medical history of CAD s/p CABG with LIMA to LAD, SVG to diagonal, SVG to OM and SVG to PDA, tobacco abuse, hypertension, hyperlipidemia, COPD on supplemental oxygen at night and obstructive sleep apnea on CPAP.  Patient underwent PCI to ostial SVG to RCA and diagonal in November 2010.  Last cardiac catheterization in January 2013 showed patent stents to SVG to RCA, LIMA was small and nearly atretic and did not reach the LAD, SVG to diagonal was occluded, SVG to OM was widely patent.  Myoview in September 2014 was low risk with moderate size distal anteroapical infarction with very mild peri-infarct ischemia.  Patient was last seen by Dr. [name] March 2019 at which time he was doing well.  Patient has a recent CT of the chest that was highly suspicious for malignancy in the right lung.  He has been evaluated by pulmonology service who will plan for PET scan and CT-guided biopsy.  He also has cancer of the lower lip as well.  He has been evaluated by [facility] plastic surgery service and plan to proceed with surgery.  He presents today for preoperative clearance.  He has a pinpoint left substernal chest pain that can occur both at rest and with exertion.  He also has chronic productive cough associated with smoking.  Unfortunately he continues to smoke 1 and 1/2 pack/day.  Otherwise he denies any lower extremity edema, orthopnea or PND.  Blood pressure is well controlled on current therapy.  EKG showed normal sinus rhythm with PVCs.  I recommended a stress test as his previous ischemic work-up was more than 6 years ago.  If stress test is low risk, he may proceed with both legs surgery and also potential  pulmonary work-up in the future as well after holding Plavix for 5 to 7 days.  It is ideal that he continue aspirin during the procedure.   Past Medical History:  Diagnosis Date   Anxiety    COPD (chronic obstructive pulmonary disease) ([place])    Coronary artery disease 2006   Acute coronary syndrome in March 2006.    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   CORONARY STENT PLACEMENT  2010   EAR CYST EXCISION Right 10/05/2018   Procedure: Right Partial pinnectomy;  Surgeon: Izora Gala, MD;  Location: [facility];  Service: ENT;  Laterality: Right;   GRAFT(S) ANGIOGRAM  10/21/2011   Procedure: GRAFT(S) Cyril Loosen;  Surgeon: Leonie Man, MD;  Location: [facility] CATH LAB;  Service: Cardiovascular;;   LEFT HEART  CATHETERIZATION WITH CORONARY ANGIOGRAM N/A 10/21/2011   Procedure: LEFT HEART CATHETERIZATION WITH CORONARY ANGIOGRAM;  Surgeon: Leonie Man, MD;  Location: [facility] CATH LAB;  Service: Cardiovascular;  Laterality: N/A;   LYMPH NODE BIOPSY Right 10/05/2018   Procedure: sentinel NODE BIOPSY with right partial modified neck dissection;  Surgeon: Izora Gala, MD;  Location: [facility] OR;  Service: ENT;  Laterality: Right;    Current Medications: Outpatient Medications Prior to Visit  Medication Sig Dispense Refill   ADVAIR DISKUS 500-50 MCG/DOSE AEPB Inhale 1 puff into the lungs 2 (two) times daily as needed (wheezing, sob).   2   ALPRAZolam (XANAX) 0.25 MG tablet Take 0.25 mg by mouth 3 (three) times daily.      amLODipine  (NORVASC) 10 MG tablet Take 10 mg by mouth daily.     aspirin EC 81 MG tablet Take 81 mg by mouth daily.     atorvastatin (LIPITOR) 40 MG tablet Take 1 tablet (40 mg total) by mouth at bedtime. Keep October Appointment 90 tablet 0   busPIRone (BUSPAR) 7.5 MG tablet Take 1 tablet by mouth 2 (two) times daily.     clopidogrel (PLAVIX) 75 MG tablet Take 1 tablet (75 mg total) by mouth daily. 90 tablet 2   ezetimibe (ZETIA) 10 MG tablet TAKE 1 TABLET BY MOUTH EVERY DAY (Patient taking differently: Take 10 mg by mouth daily. ) 90 tablet 3   famotidine (PEPCID) 20 MG tablet TAKE 1 TABLET (20 MG TOTAL) BY MOUTH DAILY. NEEDS APPOINTMENT FOR FUTURE REFILLS 30 tablet 3   INCRUSE ELLIPTA 62.5 MCG/INH AEPB Inhale 1 puff into the lungs daily.   2   Ipratropium-Albuterol (COMBIVENT RESPIMAT) 20-100 MCG/ACT AERS respimat Inhale 1 puff into the lungs every 6 (six) hours.     isosorbide mononitrate (IMDUR) 60 MG 24 hr tablet TAKE 1 TABLET BY MOUTH EVERY DAY 90 tablet 1   losartan-hydrochlorothiazide (HYZAAR) 100-12.5 MG tablet Take 1 tablet by mouth daily. 30 tablet 10   metFORMIN (GLUCOPHAGE) 500 MG tablet TAKE 1 TABLET (500 MG TOTAL) BY MOUTH 2 (TWO) TIMES DAILY WITH A MEAL. 180 tablet 0   metoprolol (TOPROL-XL) 200 MG 24 hr tablet TAKE 0.5 TABLETS (100 MG TOTAL) BY MOUTH 2 (TWO) TIMES DAILY. 30 tablet 6   ranolazine (RANEXA) 1000 MG SR tablet Take 1 tablet (1,000 mg total) by mouth 2 (two) times daily. 60 tablet 4   TRELEGY ELLIPTA 100-62.5-25 MCG/INH AEPB Inhale 1 puff into the lungs daily.  3   No facility-administered medications prior to visit.      Allergies:   Codeine and Niacin and related   Social History   Socioeconomic History   Marital status: Married    Spouse name: Not on file   Number of children: Not on file   Years of education: Not on file   Highest education level: Not on file  Occupational History   Not on file  Social Needs   Financial resource strain: Not on  file   Food insecurity    Worry: Not on file    Inability: Not on file   Transportation needs    Medical: Not on file    Non-medical: Not on file  Tobacco Use   Smoking status: Current Every Day Smoker    Packs/day: 2.00    Years: 50.00    Pack years: 100.00    Types: Cigarettes   Smokeless tobacco: Former Systems developer    Types: [place] date: 01/23/2013  Substance and Sexual Activity   Alcohol use: No   Drug use: Not Currently   Sexual activity: Not on file  Lifestyle   Physical activity    Days per week: Not on file    Minutes per session: Not on file   Stress: Not on file  Relationships   Social connections    Talks on phone: Not on file    Gets together: Not on file    Attends religious service: Not on file    Active member of club or organization: Not on file    Attends meetings of clubs or organizations: Not on file    Relationship status: Not on file  Other Topics Concern   Not on file  Social History Narrative   Not on file     Family History:  The patient's family history includes Heart attack (age of onset: 42) in his father; Hypertension in his mother.   ROS:   Please see the history of present illness.    ROS All other systems reviewed and are negative.   PHYSICAL EXAM:   VS:  BP 106/70    Pulse 100    Temp 98.2 F (36.8 C)    Ht 5\' 8"  (1.727 m)    Wt 181 lb (82.1 kg)    SpO2 98%    BMI 27.52 kg/m    GEN: Well nourished, well developed, in no acute distress  HEENT: normal  Neck: no JVD, carotid bruits, or masses Cardiac: RRR; no murmurs, rubs, or gallops,no edema  Respiratory:  clear to auscultation bilaterally, normal work of breathing GI: soft, nontender, nondistended, + BS MS: no deformity or atrophy  Skin: warm and dry, no rash Neuro:  Alert and Oriented x 3, Strength and sensation are intact Psych: euthymic mood, full affect  Wt Readings from Last 3 Encounters:  06/04/19 181 lb (82.1 kg)  06/03/19 182 lb (82.6 kg)  05/04/19 180  lb 12.8 oz (82 kg)      Studies/Labs Reviewed:   EKG:  EKG is ordered today.  The ekg ordered today demonstrates normal sinus rhythm with PVCs, no significant ST-T wave changes.  Recent Labs: 09/29/2018: BUN 14; Creatinine, Ser 1.26; Hemoglobin 12.3; Platelets 315; Potassium 4.0; Sodium 137   Lipid Panel    Component Value Date/Time   CHOL 169 01/03/2017 1303   TRIG 181 (H) 01/03/2017 1303   HDL 35 (L) 01/03/2017 1303   CHOLHDL 4.8 01/03/2017 1303   VLDL 36 (H) 01/03/2017 1303   LDLCALC 98 01/03/2017 1303    Additional studies/ records that were reviewed today include:   Myoview 06/15/2013 Impression Exercise Capacity:  Poor exercise capacity. BP Response:  Hypertensive blood pressure response. Clinical Symptoms:  No significant symptoms noted. ECG Impression:  No significant ST segment change suggestive of ischemia. Comparison with Prior Nuclear Study: No significant change from previous study  Overall Impression:  Low risk stress nuclear study with consistent evidence of a moderate sized distal anterior-apical infarction with very mild peri-infarct ischemia..  LV Wall Motion:  Mildly reduced overall LV function with distal anterior-apical dyskinesis & septal wall motion consistent with prior Sternotomy.   ASSESSMENT:    1. Preop cardiovascular exam   2. Atypical chest pain   3. Coronary artery disease involving coronary bypass graft of native heart with angina pectoris ([place])   4. Essential hypertension   5. Hyperlipidemia LDL goal <70   6. Chronic obstructive pulmonary disease, unspecified COPD type ([place])   7. OSA (obstructive sleep  apnea)   8. Tobacco abuse      PLAN:  In order of problems listed above:  1. Preoperative clearance: Patient has upcoming surgery for cancer of the lower lip.  He also has work-up for possible malignancy in the right lung as well.  He has a focal chest pain on the left side of the chest.  I recommended a Lexiscan Myoview prior to  future work-up.  We will try to get the Myoview within the next week so as not to delay the procedure  2. CAD s/p CABG: Continue aspirin and Plavix.  If Myoview is normal, he is cleared to hold the Plavix for 5 to 7 days prior to the surgery.  It is ideal for him to continue aspirin during the surgery, however if absolutely need to stop the aspirin, this will need to be held for 7 days prior to the surgery as well.  3. Hypertension: Blood pressure stable  4. Hyperlipidemia: Continue Lipitor  5. COPD: On home oxygen  6. Obstructive sleep apnea: On CPAP.  7. Tobacco abuse: Tobacco cessation is imperative.    Medication Adjustments/Labs and Tests Ordered: Current medicines are reviewed at length with the patient today.  Concerns regarding medicines are outlined above.  Medication changes, Labs and Tests ordered today are listed in the Patient Instructions below. Patient Instructions  Medication Instructions:  Your physician recommends that you continue on your current medications as directed. Please refer to the Current Medication list given to you today.  If you need a refill on your cardiac medications before your next appointment, please call your pharmacy.   Lab work: NONE ordered at this time of appointment   If you have labs (blood work) drawn today and your tests are completely normal, you will receive your results only by:  [place] (if you have MyChart) OR  A paper copy in the mail If you have any lab test that is abnormal or we need to change your treatment, we will call you to review the results.  Testing/Procedures: Your physician has requested that you have a lexiscan myoview. For further information please visit [email or web address]. Please follow instruction sheet, as given.  Please schedule for 1-2 weeks   Follow-Up: At [facility], you and your health needs are our priority.  As part of our continuing mission to provide you with exceptional heart care,  we have created designated Provider Care Teams.  These Care Teams include your primary Cardiologist (physician) and Advanced Practice Providers (APPs -  Physician Assistants and Nurse Practitioners) who all work together to provide you with the care you need, when you need it. You will need a follow up appointment in 6 months.  Please call our office 2 months in advance to schedule this appointment.  You may see [name], MD or one of the following Advanced Practice Providers on your designated Care Team: Almyra Deforest, PA-C  Fabian Sharp, PA-C  Any Other Special Instructions Will Be Listed Below (If Applicable).       Hilbert Corrigan, [place]  06/06/2019 10:40 AM    [address], [place], [place]  [address] Phone: [phone]; Fax: [phone]

## 2019-06-06 ENCOUNTER — Encounter: Payer: Self-pay | Admitting: Physician Assistant

## 2019-06-09 ENCOUNTER — Other Ambulatory Visit: Payer: Self-pay

## 2019-06-09 ENCOUNTER — Ambulatory Visit (INDEPENDENT_AMBULATORY_CARE_PROVIDER_SITE_OTHER): Payer: Medicare Other | Admitting: Internal Medicine

## 2019-06-09 DIAGNOSIS — R0602 Shortness of breath: Secondary | ICD-10-CM

## 2019-06-09 DIAGNOSIS — F17200 Nicotine dependence, unspecified, uncomplicated: Secondary | ICD-10-CM

## 2019-06-09 LAB — PULMONARY FUNCTION TEST

## 2019-06-10 ENCOUNTER — Ambulatory Visit
Admission: RE | Admit: 2019-06-10 | Discharge: 2019-06-10 | Disposition: A | Discharge status: Home / Self Care | Payer: Medicare Other | Source: Ambulatory Visit | Attending: Internal Medicine | Admitting: Internal Medicine

## 2019-06-10 DIAGNOSIS — R911 Solitary pulmonary nodule: Secondary | ICD-10-CM

## 2019-06-10 DIAGNOSIS — E119 Type 2 diabetes mellitus without complications: Secondary | ICD-10-CM | POA: Insufficient documentation

## 2019-06-10 DIAGNOSIS — I700 Atherosclerosis of aorta: Secondary | ICD-10-CM | POA: Insufficient documentation

## 2019-06-10 DIAGNOSIS — N2889 Other specified disorders of kidney and ureter: Secondary | ICD-10-CM | POA: Diagnosis not present

## 2019-06-10 DIAGNOSIS — F17210 Nicotine dependence, cigarettes, uncomplicated: Secondary | ICD-10-CM | POA: Insufficient documentation

## 2019-06-10 DIAGNOSIS — I10 Essential (primary) hypertension: Secondary | ICD-10-CM | POA: Diagnosis not present

## 2019-06-10 DIAGNOSIS — Z7984 Long term (current) use of oral hypoglycemic drugs: Secondary | ICD-10-CM | POA: Diagnosis not present

## 2019-06-10 DIAGNOSIS — C3411 Malignant neoplasm of upper lobe, right bronchus or lung: Secondary | ICD-10-CM | POA: Insufficient documentation

## 2019-06-10 DIAGNOSIS — I25810 Atherosclerosis of coronary artery bypass graft(s) without angina pectoris: Secondary | ICD-10-CM | POA: Insufficient documentation

## 2019-06-10 DIAGNOSIS — J439 Emphysema, unspecified: Secondary | ICD-10-CM | POA: Diagnosis not present

## 2019-06-10 DIAGNOSIS — D3502 Benign neoplasm of left adrenal gland: Secondary | ICD-10-CM | POA: Insufficient documentation

## 2019-06-10 LAB — GLUCOSE, CAPILLARY: Glucose-Capillary: 122 mg/dL — ABNORMAL HIGH (ref 70–99)

## 2019-06-10 MED ORDER — FLUDEOXYGLUCOSE F - 18 (FDG) INJECTION
10.0400 | Freq: Once | INTRAVENOUS | Status: AC | PRN
  Administered 2019-06-10: 10.04 via INTRAVENOUS

## 2019-06-10 NOTE — Procedures (Signed)
[facility] [place], [address]  DATE OF SERVICE: June 09, 2019  Complete Pulmonary Function Testing Interpretation:  FINDINGS:  The forced vital capacity is moderately decreased.  The FEV1 is 1.06 L which is 35% of predicted and is severely decreased.  Postbronchodilator there is no significant change in the FEV1 however clinical improvement may occur in the absence of spirometric improvement.  Total lung capacity is mildly decreased.  Residual volume is increased residual volume total lung capacity ratio is increased thoracic gas volume is normal.  DLCO was moderately decreased.  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  The DLCO was also moderately decreased.  Clinical correlation recommended  Allyne Gee, MD [facility] Pulmonary Critical Care Medicine Sleep Medicine

## 2019-06-11 ENCOUNTER — Other Ambulatory Visit: Payer: Self-pay | Admitting: Internal Medicine

## 2019-06-11 ENCOUNTER — Other Ambulatory Visit: Payer: Self-pay | Admitting: Cardiovascular Disease

## 2019-06-11 ENCOUNTER — Telehealth (HOSPITAL_COMMUNITY): Payer: Self-pay

## 2019-06-11 NOTE — Telephone Encounter (Signed)
Encounter complete. 

## 2019-06-16 ENCOUNTER — Other Ambulatory Visit: Payer: Self-pay

## 2019-06-16 ENCOUNTER — Ambulatory Visit (HOSPITAL_COMMUNITY)
Admission: RE | Admit: 2019-06-16 | Discharge: 2019-06-16 | Disposition: A | Discharge status: Home / Self Care | Payer: Medicare Other | Source: Ambulatory Visit | Attending: Cardiology | Admitting: Cardiology

## 2019-06-16 ENCOUNTER — Other Ambulatory Visit: Payer: Self-pay | Admitting: Internal Medicine

## 2019-06-16 DIAGNOSIS — R0789 Other chest pain: Secondary | ICD-10-CM | POA: Diagnosis not present

## 2019-06-16 DIAGNOSIS — R918 Other nonspecific abnormal finding of lung field: Secondary | ICD-10-CM

## 2019-06-16 LAB — MYOCARDIAL PERFUSION IMAGING
LV dias vol: 112 mL (ref 62–150)
LV sys vol: 53 mL
Peak HR: 105 {beats}/min
Rest HR: 91 {beats}/min
SDS: 3
SRS: 10
SSS: 13
TID: 0.92

## 2019-06-16 MED ORDER — AMINOPHYLLINE 25 MG/ML IV SOLN
75.0000 mg | Freq: Once | INTRAVENOUS | Status: AC
  Administered 2019-06-16: 75 mg via INTRAVENOUS

## 2019-06-16 MED ORDER — TECHNETIUM TC 99M TETROFOSMIN IV KIT
9.8000 | PACK | Freq: Once | INTRAVENOUS | Status: AC | PRN
  Administered 2019-06-16: 9.8 via INTRAVENOUS
  Filled 2019-06-16: qty 10

## 2019-06-16 MED ORDER — TECHNETIUM TC 99M TETROFOSMIN IV KIT
29.6000 | PACK | Freq: Once | INTRAVENOUS | Status: AC | PRN
  Administered 2019-06-16: 29.6 via INTRAVENOUS
  Filled 2019-06-16: qty 30

## 2019-06-16 MED ORDER — REGADENOSON 0.4 MG/5ML IV SOLN
0.4000 mg | Freq: Once | INTRAVENOUS | Status: AC
  Administered 2019-06-16: 0.4 mg via INTRAVENOUS

## 2019-06-17 ENCOUNTER — Telehealth: Payer: Self-pay | Admitting: Cardiovascular Disease

## 2019-06-17 ENCOUNTER — Telehealth: Payer: Self-pay | Admitting: Internal Medicine

## 2019-06-17 NOTE — Telephone Encounter (Signed)
   Primary Cardiologist: [name], MD  Chart reviewed as part of pre-operative protocol coverage. Given past medical history and time since last visit, based on ACC/AHA guidelines, Troy [name] would be at acceptable risk for the planned procedure without further cardiovascular testing.   I will route this recommendation to the requesting party via Epic fax function and remove from pre-op pool.  Please call with questions. Recent stress test is reassuring, I have called the patient and cleared him for surgery. He is aware to hold plavix for 5 days prior to the procedure.   [place], [place] 06/17/2019, 5:06 PM

## 2019-06-17 NOTE — Progress Notes (Signed)
Reassuring stress test, although it does show a scar in the front of the heart, there is no significant reversible blockage. The scar at the front of the heart was also seen in the previous stress test and there is no change. He is cleared from cardiac perspective for surgery. I will type up cardiac clearance letter. (see under Epic, note, uncheck to show all)

## 2019-06-17 NOTE — Telephone Encounter (Signed)
° °  ° °  [facility] Pre-operative Risk Assessment    Request for surgical clearance:  1. What type of surgery is being performed? Dr Devona Konig has scheduled patient CT Biopsy and hospital has requested his cardiology provider to clear patient to come off of Plavix 5 days prior to biopsy  2. When is this surgery scheduled? 06/22/19  3. What type of clearance is required (medical clearance vs. Pharmacy clearance to hold med vs. Both)?   4. Are there any medications that need to be held prior to surgery and how long?Plavix  5. Practice name and name of physician performing surgery? Dr Lanice Schwab  6. What is your office phone number [phone]   7.   What is your office fax number [phone]  8.   Anesthesia type (None, local, MAC, general) ?   [place] 06/17/2019, 4:25 PM  _________________________________________________________________   (provider comments below)

## 2019-06-18 ENCOUNTER — Other Ambulatory Visit
Admission: RE | Admit: 2019-06-18 | Discharge: 2019-06-18 | Disposition: A | Discharge status: Home / Self Care | Payer: Medicare Other | Source: Ambulatory Visit | Attending: Internal Medicine | Admitting: Internal Medicine

## 2019-06-18 ENCOUNTER — Other Ambulatory Visit: Payer: Self-pay

## 2019-06-18 DIAGNOSIS — Z01812 Encounter for preprocedural laboratory examination: Secondary | ICD-10-CM | POA: Diagnosis not present

## 2019-06-18 DIAGNOSIS — Z20828 Contact with and (suspected) exposure to other viral communicable diseases: Secondary | ICD-10-CM | POA: Insufficient documentation

## 2019-06-18 NOTE — Telephone Encounter (Signed)
I have not seen patient since March 2019.  If he has remained cardiac stable since Ok to hold Plavix for 5 days to have biopsy.

## 2019-06-19 LAB — SARS CORONAVIRUS 2 (TAT 6-24 HRS): SARS Coronavirus 2: NEGATIVE

## 2019-06-22 ENCOUNTER — Other Ambulatory Visit: Payer: Self-pay | Admitting: Physician Assistant

## 2019-06-23 ENCOUNTER — Other Ambulatory Visit: Payer: Self-pay

## 2019-06-23 ENCOUNTER — Ambulatory Visit
Admission: RE | Admit: 2019-06-23 | Discharge: 2019-06-23 | Disposition: A | Discharge status: Home / Self Care | Payer: Medicare Other | Source: Ambulatory Visit | Attending: Interventional Radiology | Admitting: Interventional Radiology

## 2019-06-23 ENCOUNTER — Ambulatory Visit
Admission: RE | Admit: 2019-06-23 | Discharge: 2019-06-23 | Disposition: A | Discharge status: Home / Self Care | Payer: Medicare Other | Source: Ambulatory Visit | Attending: Internal Medicine | Admitting: Internal Medicine

## 2019-06-23 DIAGNOSIS — Z955 Presence of coronary angioplasty implant and graft: Secondary | ICD-10-CM | POA: Diagnosis not present

## 2019-06-23 DIAGNOSIS — E119 Type 2 diabetes mellitus without complications: Secondary | ICD-10-CM | POA: Diagnosis not present

## 2019-06-23 DIAGNOSIS — C3411 Malignant neoplasm of upper lobe, right bronchus or lung: Secondary | ICD-10-CM | POA: Diagnosis not present

## 2019-06-23 DIAGNOSIS — Z888 Allergy status to other drugs, medicaments and biological substances status: Secondary | ICD-10-CM | POA: Insufficient documentation

## 2019-06-23 DIAGNOSIS — I2510 Atherosclerotic heart disease of native coronary artery without angina pectoris: Secondary | ICD-10-CM | POA: Insufficient documentation

## 2019-06-23 DIAGNOSIS — Z885 Allergy status to narcotic agent status: Secondary | ICD-10-CM | POA: Insufficient documentation

## 2019-06-23 DIAGNOSIS — J939 Pneumothorax, unspecified: Secondary | ICD-10-CM | POA: Insufficient documentation

## 2019-06-23 DIAGNOSIS — Z7984 Long term (current) use of oral hypoglycemic drugs: Secondary | ICD-10-CM | POA: Diagnosis not present

## 2019-06-23 DIAGNOSIS — Z7982 Long term (current) use of aspirin: Secondary | ICD-10-CM | POA: Diagnosis not present

## 2019-06-23 DIAGNOSIS — Z8249 Family history of ischemic heart disease and other diseases of the circulatory system: Secondary | ICD-10-CM | POA: Diagnosis not present

## 2019-06-23 DIAGNOSIS — Z7902 Long term (current) use of antithrombotics/antiplatelets: Secondary | ICD-10-CM | POA: Insufficient documentation

## 2019-06-23 DIAGNOSIS — Z951 Presence of aortocoronary bypass graft: Secondary | ICD-10-CM | POA: Insufficient documentation

## 2019-06-23 DIAGNOSIS — E785 Hyperlipidemia, unspecified: Secondary | ICD-10-CM | POA: Diagnosis not present

## 2019-06-23 DIAGNOSIS — Z7951 Long term (current) use of inhaled steroids: Secondary | ICD-10-CM | POA: Diagnosis not present

## 2019-06-23 DIAGNOSIS — R911 Solitary pulmonary nodule: Secondary | ICD-10-CM | POA: Diagnosis not present

## 2019-06-23 DIAGNOSIS — Z79899 Other long term (current) drug therapy: Secondary | ICD-10-CM | POA: Diagnosis not present

## 2019-06-23 DIAGNOSIS — G4730 Sleep apnea, unspecified: Secondary | ICD-10-CM | POA: Insufficient documentation

## 2019-06-23 DIAGNOSIS — J95811 Postprocedural pneumothorax: Secondary | ICD-10-CM

## 2019-06-23 DIAGNOSIS — F419 Anxiety disorder, unspecified: Secondary | ICD-10-CM | POA: Diagnosis not present

## 2019-06-23 DIAGNOSIS — J449 Chronic obstructive pulmonary disease, unspecified: Secondary | ICD-10-CM | POA: Diagnosis not present

## 2019-06-23 DIAGNOSIS — F17210 Nicotine dependence, cigarettes, uncomplicated: Secondary | ICD-10-CM | POA: Diagnosis not present

## 2019-06-23 DIAGNOSIS — Z7689 Persons encountering health services in other specified circumstances: Secondary | ICD-10-CM | POA: Diagnosis not present

## 2019-06-23 DIAGNOSIS — I10 Essential (primary) hypertension: Secondary | ICD-10-CM | POA: Diagnosis not present

## 2019-06-23 LAB — CBC
HCT: 40.3 % (ref 39.0–52.0)
Hemoglobin: 12.8 g/dL — ABNORMAL LOW (ref 13.0–17.0)
MCH: 28.7 pg (ref 26.0–34.0)
MCHC: 31.8 g/dL (ref 30.0–36.0)
MCV: 90.4 fL (ref 80.0–100.0)
Platelets: 346 10*3/uL (ref 150–400)
RBC: 4.46 MIL/uL (ref 4.22–5.81)
RDW: 14.3 % (ref 11.5–15.5)
WBC: 12.4 10*3/uL — ABNORMAL HIGH (ref 4.0–10.5)
nRBC: 0 % (ref 0.0–0.2)

## 2019-06-23 LAB — PROTIME-INR
INR: 1 (ref 0.8–1.2)
Prothrombin Time: 12.9 seconds (ref 11.4–15.2)

## 2019-06-23 LAB — GLUCOSE, CAPILLARY: Glucose-Capillary: 112 mg/dL — ABNORMAL HIGH (ref 70–99)

## 2019-06-23 MED ORDER — MIDAZOLAM HCL 2 MG/2ML IJ SOLN
INTRAMUSCULAR | Status: AC | PRN
  Administered 2019-06-23 (×2): 1 mg via INTRAVENOUS

## 2019-06-23 MED ORDER — ACETAMINOPHEN 325 MG PO TABS
ORAL_TABLET | ORAL | Status: AC
  Filled 2019-06-23: qty 2

## 2019-06-23 MED ORDER — FENTANYL CITRATE (PF) 100 MCG/2ML IJ SOLN
INTRAMUSCULAR | Status: AC | PRN
  Administered 2019-06-23 (×2): 50 ug via INTRAVENOUS

## 2019-06-23 MED ORDER — FENTANYL CITRATE (PF) 100 MCG/2ML IJ SOLN
INTRAMUSCULAR | Status: AC
  Filled 2019-06-23: qty 2

## 2019-06-23 MED ORDER — MIDAZOLAM HCL 2 MG/2ML IJ SOLN
INTRAMUSCULAR | Status: AC
  Filled 2019-06-23: qty 2

## 2019-06-23 MED ORDER — SODIUM CHLORIDE 0.9 % IV SOLN
INTRAVENOUS | Status: DC
  Administered 2019-06-23: 11:00:00 via INTRAVENOUS

## 2019-06-23 NOTE — Procedures (Signed)
Interventional Radiology Procedure Note  Procedure: CT guided biopsy of RUL nodule Complications: None Recommendations: - Bedrest until CXR cleared.  Minimize talking, coughing or otherwise straining.  - Follow up 1 hr CXR pending  - NPO until CXR cleared  Signed,  Corrie Mckusick, DO

## 2019-06-23 NOTE — Progress Notes (Signed)
Patient continues to remain stable post procedure. Hour post CXR noting small pneumo per DR Earleen Newport, waiting another hour to repeat CXR thereafter decision to be made for possible discharge. No shortness of breath. Has remained on RA since post op. Denies complaints. Have spoke again with patient's wife regarding status post procedure with questions answered.

## 2019-06-23 NOTE — Progress Notes (Signed)
Interventional Radiology Progress Note   Patient remains asymptomatic after lung biopsy, with small stable PTX on sequential CXR.   Vitals unchanged, WNL.   Plan for DC home for recovery.   He understands that should he have any symptoms at home or concerns, he should return for ED care.   Signed,  Dulcy Fanny. Earleen Newport, DO

## 2019-06-23 NOTE — Progress Notes (Signed)
Patient remains clinically stable post CT LUng biopsy per DR Earleen Newport. Vitals stable. sats stable on RA. Patient resting at this time. Remains NPO until after 1300 CXR post biopsy. Denies complaints at this time. Spoke with patient's wife with update given with questions answered.

## 2019-06-23 NOTE — Discharge Instructions (Signed)
Lung Biopsy, Care After °This sheet gives you information about how to care for yourself after your procedure. Your health care provider may also give you more specific instructions depending on the type of biopsy you had. If you have problems or questions, contact your health care provider. °What can I expect after the procedure? °After the procedure, it is common to have: °· A cough. °· A sore throat. °· Pain where a needle, bronchoscope, or incision was used to collect a biopsy sample (biopsy site). °Follow these instructions at home: °Medicines °· Take over-the-counter and prescription medicines only as told by your health care provider. °· Do not drink alcohol if your health care provider tells you not to drink. °· Ask your health care provider if the medicine prescribed to you: °? Requires you to avoid driving or using heavy machinery. °? Can cause constipation. You may need to take these actions to prevent or treat constipation: °§ Drink enough fluid to keep your urine pale yellow. °§ Take over-the-counter or prescription medicines. °§ Eat foods that are high in fiber, such as beans, whole grains, and fresh fruits and vegetables. °§ Limit foods that are high in fat and processed sugars, such as fried or sweet foods. °· Do not drive for 24 hours if you were given a sedative. °Biopsy site care ° °· Follow instructions from your health care provider about how to take care of your biopsy site. Make sure you: °? Wash your hands with soap and water before and after you change your bandage (dressing). If soap and water are not available, use hand sanitizer. °? Change your dressing as told by your health care provider. °? Leave stitches (sutures), skin glue, or adhesive strips in place. These skin closures may need to stay in place for 2 weeks or longer. If adhesive strip edges start to loosen and curl up, you may trim the loose edges. Do not remove adhesive strips completely unless your health care provider tells  you to do that. °· Do not take baths, swim, or use a hot tub until your health care provider approves. Ask your health care provider if you may take showers. You may only be allowed to take sponge baths. °· Check your biopsy site every day for signs of infection. Check for: °? Redness, swelling, or more pain. °? Fluid or blood. °? Warmth. °? Pus or a bad smell. °General instructions °· Return to your normal activities as told by your health care provider. Ask your health care provider what activities are safe for you. °· It is up to you to get the results of your procedure. Ask your health care provider, or the department that is doing the procedure, when your results will be ready. °· Keep all follow-up visits as told by your health care provider. This is important. °Contact a health care provider if: °· You have a fever. °· You have redness, swelling, or more pain around your biopsy site. °· You have fluid or blood coming from your biopsy site. °· Your biopsy site feels warm to the touch. °· You have pus or a bad smell coming from your biopsy site. °· You have pain that does not get better with medicine. °Get help right away if: °· You cough up blood. °· You have trouble breathing. °· You have chest pain. °· You lose consciousness. °Summary °· After the procedure, it is common to have a sore throat and a cough. °· Return to your normal activities as told by your   health care provider. Ask your health care provider what activities are safe for you. °· Take over-the-counter and prescription medicines only as told by your health care provider. °· Report any unusual symptoms to your health care provider. °This information is not intended to replace advice given to you by your health care provider. Make sure you discuss any questions you have with your health care provider. °Document Released: 10/29/2016 Document Revised: 11/04/2018 Document Reviewed: 10/29/2016 °Elsevier Patient Education © 2020 [employer] ° °

## 2019-06-23 NOTE — H&P (Signed)
Chief Complaint: RUL nodule  Referring Physician(s): [place]  Supervising Physician: Corrie Mckusick  Patient Status: [facility] - Out-pt  History of Present Illness: Troy [name] is a 68 y.o. male with FDG avid RUL nodule, concerning for carcinoma.    He has been referred for biopsy.   He denies any new symptoms of fever/rigor/chills.   Past Medical History:  Diagnosis Date   Anxiety    COPD (chronic obstructive pulmonary disease) ([place])    Coronary artery disease 2006   Acute coronary syndrome in March 2006.    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   CORONARY STENT PLACEMENT  2010   EAR CYST EXCISION Right 10/05/2018   Procedure: Right Partial pinnectomy;  Surgeon: Izora Gala, MD;  Location: [place];  Service: ENT;  Laterality: Right;   GRAFT(S) ANGIOGRAM  10/21/2011   Procedure: GRAFT(S) Cyril Loosen;  Surgeon: Leonie Man, MD;  Location: [facility] CATH LAB;  Service: Cardiovascular;;   LEFT HEART CATHETERIZATION WITH CORONARY ANGIOGRAM N/A 10/21/2011   Procedure: LEFT HEART CATHETERIZATION WITH CORONARY ANGIOGRAM;  Surgeon: Leonie Man, MD;  Location: [facility] CATH LAB;  Service: Cardiovascular;  Laterality: N/A;   LYMPH NODE BIOPSY Right 10/05/2018   Procedure: sentinel NODE BIOPSY with right partial modified neck dissection;  Surgeon: Izora Gala, MD;  Location: [place];  Service: ENT;  Laterality: Right;    Allergies: Codeine and Niacin and related  Medications: Prior to Admission medications     Medication Sig Start Date End Date Taking? Authorizing Provider  ADVAIR DISKUS 500-50 MCG/DOSE AEPB Inhale 1 puff into the lungs 2 (two) times daily as needed (wheezing, sob).  08/27/14  Yes [provider]  ALPRAZolam (XANAX) 0.25 MG tablet Take 0.25 mg by mouth 3 (three) times daily.    Yes [provider]  amLODipine (NORVASC) 10 MG tablet Take 10 mg by mouth daily.   Yes [provider]  aspirin EC 81 MG tablet Take 81 mg by mouth daily.   Yes [provider]  atorvastatin (LIPITOR) 40 MG tablet Take 1 tablet (40 mg total) by mouth at bedtime. Keep [place] Appointment 05/06/19  Yes Troy Sine, MD  busPIRone (BUSPAR) 7.5 MG tablet Take 1 tablet by mouth 2 (two) times daily. 09/04/14  Yes [provider]  famotidine (PEPCID) 20 MG tablet TAKE 1 TABLET (20 MG TOTAL) BY MOUTH DAILY. NEEDS APPOINTMENT FOR FUTURE REFILLS 05/24/19  Yes Troy Sine, MD  INCRUSE ELLIPTA 62.5 MCG/INH AEPB Inhale 1 puff into the lungs daily.  12/09/16  Yes [provider]  Ipratropium-Albuterol (COMBIVENT RESPIMAT) 20-100 MCG/ACT AERS respimat Inhale 1 puff into the lungs every 6 (six) hours.   Yes [provider]  isosorbide mononitrate (IMDUR) 60 MG 24 hr tablet TAKE 1 TABLET BY MOUTH EVERY DAY 05/08/15  Yes Troy Sine, MD  losartan-hydrochlorothiazide (HYZAAR) 100-12.5 MG tablet Take 1  tablet by mouth daily. 12/31/16  Yes Troy Sine, MD  metFORMIN (GLUCOPHAGE) 500 MG tablet TAKE 1 TABLET (500 MG TOTAL) BY MOUTH 2 (TWO) TIMES DAILY WITH A MEAL. 07/14/18  Yes Troy Sine, MD  metoprolol (TOPROL-XL) 200 MG 24 hr tablet TAKE 0.5 TABLETS (100 MG TOTAL) BY MOUTH 2 (TWO) TIMES DAILY. 08/20/17  Yes Troy Sine, MD  ranolazine (RANEXA) 1000 MG SR tablet TAKE 1 TABLET BY MOUTH TWICE A DAY 06/11/19  Yes Troy Sine, MD  TRELEGY ELLIPTA 100-62.5-25 MCG/INH AEPB Inhale 1 puff into the lungs daily. 07/13/18  Yes [provider]  clopidogrel  (PLAVIX) 75 MG tablet Take 1 tablet (75 mg total) by mouth daily. Patient not taking: Reported on 06/23/2019 02/07/16   Troy Sine, MD  ezetimibe (ZETIA) 10 MG tablet TAKE 1 TABLET BY MOUTH EVERY DAY Patient taking differently: Take 10 mg by mouth daily.  09/21/18   Troy Sine, MD     Family History  Problem Relation Age of Onset   Hypertension Mother    Heart attack Father 42    Social History   Socioeconomic History   Marital status: Married    Spouse name: Not on file   Number of children: Not on file   Years of education: Not on file   Highest education level: Not on file  Occupational History   Not on file  Social Needs   Financial resource strain: Not on file   Food insecurity    Worry: Not on file    Inability: Not on file   Transportation needs    Medical: Not on file    Non-medical: Not on file  Tobacco Use   Smoking status: Current Every Day Smoker    Packs/day: 2.00    Years: 50.00    Pack years: 100.00    Types: Cigarettes   Smokeless tobacco: Former Systems developer    Types: Chew    Quit date: 01/23/2013  Substance and Sexual Activity   Alcohol use: No   Drug use: Not Currently   Sexual activity: Not on file  Lifestyle   Physical activity    Days per week: Not on file    Minutes per session: Not on file   Stress: Not on file  Relationships   Social connections    Talks on phone: Not on file    Gets together: Not on file    Attends religious service: Not on file    Active member of club or organization: Not on file    Attends meetings of clubs or organizations: Not on file    Relationship status: Not on file  Other Topics Concern   Not on file  Social History Narrative   Not on file      Review of Systems: A 12 point ROS discussed and pertinent positives are indicated in the HPI above.  All other systems are negative.  Review of Systems  Vital Signs: BP 102/72 (BP Location: Left Arm)    Pulse 70    Temp 98.2 F (36.8 C)  (Oral)    Resp 10    Ht 5\' 8"  (1.727 m)    Wt 82.1 kg    SpO2 100%    BMI 27.52 kg/m   Physical Exam General: 68 yo male appearing older than stated age.  Well-developed, well-nourished.  No distress. HEENT: Atraumatic, normocephalic.  Conjugate gaze, extra-ocular motor intact. No scleral icterus or scleral injection. No lesions on external ears, nose,  lips, or gums.  Oral mucosa moist, pink.  Neck: Symmetric with no goiter enlargement.  Chest/Lungs:  Symmetric chest with inspiration/expiration.  No labored breathing.  Clear to auscultation with no wheezes, rhonchi, or rales.  Heart:  RRR, with no third heart sounds appreciated. No JVD appreciated.  Abdomen:  Soft, NT/ND, with + bowel sounds.   Genito-urinary: Deferred Neurologic: Alert & Oriented to person, place, and time.   Normal affect and insight.  Appropriate questions.  Moving all 4 extremities with gross sensory intact.    Imaging: Nm Pet Image Initial (pi) Skull Base To Thigh  Result Date: 06/10/2019 CLINICAL DATA:  Initial treatment strategy for right-sided pulmonary nodule. No prior chemotherapy or radiation therapy. History of head neck cancer. EXAM: NUCLEAR MEDICINE PET SKULL BASE TO THIGH TECHNIQUE: 10.0 mCi F-18 FDG was injected intravenously. Full-ring PET imaging was performed from the skull base to thigh after the radiotracer. CT data was obtained and used for attenuation correction and anatomic localization. Fasting blood glucose: 122 mg/dl COMPARISON:  Chest radiograph 12/27/2016 FINDINGS: Mediastinal blood pool activity: SUV max 2.6 Liver activity: SUV max NA NECK: No areas of abnormal hypermetabolism. Incidental CT findings: Bilateral carotid atherosclerosis. Cerebral atrophy. CHEST: Right upper lobe pulmonary nodule is relatively well-circumscribed and measures 2.0 cm. Corresponds to hypermetabolism at a S.U.V. max of 10.7 on image 96/609. No mediastinal hypermetabolism. There is equivocal right infrahilar hypermetabolism,  including at a S.U.V. max of 3.1 on image 107/609. Incidental CT findings: Median sternotomy for CABG. Aortic atherosclerosis. Moderate centrilobular emphysema. Nonspecific tree-in-bud nodularity is basilar predominant and mild bilaterally. A 3 mm right lower lobe pulmonary nodule on 114/609. 6 mm right lower lobe pulmonary nodule on image 111. Left lower lobe scarring ABDOMEN/PELVIS: Left adrenal mass measures 2.8 x 2.9 cm and is low-density, consistent with an adenoma. This has heterogeneous hypermetabolism within, including at a S.U.V. max of 3.1. The inter and lower pole region of the right kidney is replaced by a heterogeneous solid mass, including at 9.7 x 9.0 cm on 189/609. This corresponds to hypermetabolism at a S.U.V. max of 7.6. Incidental CT findings: Abdominal aortic atherosclerosis. Mild right adrenal nodularity, also likely due to an adenoma. Upper pole right renal 3.6 cm fluid density lesion is likely a cyst. 7 mm pre caval node is not pathologic by CT size criteria. Saccular outpouching of the suprarenal aorta of 1.0 cm, at the 4 o'clock position on 10767/609. Mild prostatomegaly. SKELETON: No abnormal marrow activity. Incidental CT findings: none IMPRESSION: 1. Hypermetabolic right upper lobe pulmonary nodule, favored to represent primary bronchogenic carcinoma. Isolated metastatic disease from 1 of the patient's primaries felt less likely. 2. Equivocal right infrahilar nodal hypermetabolism. Consider correlation with contrast-enhanced chest CT to evaluate for adenopathy in this area. 3. Right renal mass, consistent with renal cell carcinoma. 4. Pulmonary nodules, for which metastasis cannot be excluded. 5. Dominant left adrenal adenoma. Probable smaller right adrenal adenoma. 6. Aortic atherosclerosis (ICD10-I70.0) and emphysema (ICD10-J43.9). 7. Tree-in-bud nodularity at the lung bases is suspicious for atypical infection or aspiration. These results will be called to the ordering clinician or  representative by the Radiologist Assistant, and communication documented in the PACS or zVision Dashboard. Electronically Signed   By: Abigail Miyamoto M.D.   On: 06/10/2019 15:57    Labs:  CBC: Recent Labs    09/29/18 1504 06/23/19 1028  WBC 11.9* 12.4*  HGB 12.3* 12.8*  HCT 38.7* 40.3  PLT 315 346    COAGS: Recent Labs  06/23/19 1028  INR 1.0    BMP: Recent Labs    09/29/18 1504  NA 137  K 4.0  CL 100  CO2 25  GLUCOSE 96  BUN 14  CALCIUM 9.6  CREATININE 1.26*  GFRNONAA 58*  GFRAA >60    LIVER FUNCTION TESTS: No results for input(s): BILITOT, AST, ALT, ALKPHOS, PROT, ALBUMIN in the last 8760 hours.  TUMOR MARKERS: No results for input(s): AFPTM, CEA, CA199, CHROMGRNA in the last 8760 hours.  Assessment and Plan:   68 yo male presents for lung nodule biopsy.   Risks and benefits of CT guided lung nodule biopsy was discussed with the patient including, but not limited to bleeding, hemoptysis, respiratory failure requiring intubation, infection, pneumothorax requiring chest tube placement, stroke from air embolism or even death.  All of the patient's questions were answered and the patient is agreeable to proceed.  Consent signed and in chart.  Thank you for this interesting consult.  I greatly enjoyed meeting Troy [name] and look forward to participating in their care.  A copy of this report was sent to the requesting provider on this date.  Electronically Signed: Corrie Mckusick, DO 06/23/2019, 12:06 PM   I spent a total of  40 Minutes   in face to face in clinical consultation, greater than 50% of which was counseling/coordinating care for right lung nodule, possible image guided biopsy

## 2019-06-25 ENCOUNTER — Other Ambulatory Visit: Payer: Self-pay | Admitting: Oncology

## 2019-06-25 LAB — SURGICAL PATHOLOGY

## 2019-06-28 ENCOUNTER — Ambulatory Visit (INDEPENDENT_AMBULATORY_CARE_PROVIDER_SITE_OTHER): Payer: Medicare Other | Admitting: Internal Medicine

## 2019-06-28 ENCOUNTER — Encounter: Payer: Self-pay | Admitting: *Deleted

## 2019-06-28 ENCOUNTER — Encounter: Payer: Self-pay | Admitting: Internal Medicine

## 2019-06-28 ENCOUNTER — Other Ambulatory Visit: Payer: Self-pay

## 2019-06-28 VITALS — BP 116/62 | HR 81 | Resp 16 | Ht 68.0 in | Wt 181.0 lb

## 2019-06-28 DIAGNOSIS — J449 Chronic obstructive pulmonary disease, unspecified: Secondary | ICD-10-CM

## 2019-06-28 DIAGNOSIS — C3411 Malignant neoplasm of upper lobe, right bronchus or lung: Secondary | ICD-10-CM | POA: Diagnosis not present

## 2019-06-28 DIAGNOSIS — F17200 Nicotine dependence, unspecified, uncomplicated: Secondary | ICD-10-CM | POA: Diagnosis not present

## 2019-06-28 NOTE — Progress Notes (Signed)
[facility], [place] [address]  Pulmonary Sleep Medicine   Office Visit Note  Patient Name: Troy [name] DOB: 1951-02-21 MRN [id number]  Date of Service: 06/28/2019  Complaints/HPI: Patient is here for follow-up after CT-guided needle biopsy.  I spoke with the pathologist last week and patient does have positive for cancer cells the results are noted in the chart.  Per patient request they would like to try to keep all their treatment locally so he will be referred to the cancer center.  The patient denies having any hemoptysis denies any chest pain.  He tolerated procedure well.  The patient is unfortunately still smoking.  Counseling was once again provided about smoking cessation.  Questions regarding the modality of treatment management was discussed with the patient and family at the bedside.  And referral was made to oncology.  ROS  General: (-) fever, (-) chills, (-) night sweats, (-) weakness Skin: (-) rashes, (-) itching,. Eyes: (-) visual changes, (-) redness, (-) itching. Nose and Sinuses: (-) nasal stuffiness or itchiness, (-) postnasal drip, (-) nosebleeds, (-) sinus trouble. Mouth and Throat: (-) sore throat, (-) hoarseness. Neck: (-) swollen glands, (-) enlarged thyroid, (-) neck pain. Respiratory: + cough, (-) bloody sputum, + shortness of breath, + wheezing. Cardiovascular: - ankle swelling, (-) chest pain. Lymphatic: (-) lymph node enlargement. Neurologic: (-) numbness, (-) tingling. Psychiatric: (-) anxiety, (-) depression   Current Medication: Outpatient Encounter Medications as of 06/28/2019  Medication Sig  . ADVAIR DISKUS 500-50 MCG/DOSE AEPB Inhale 1 puff into the lungs 2 (two) times daily as needed (wheezing, sob).   . ALPRAZolam (XANAX) 0.25 MG tablet Take 0.25 mg by mouth 3 (three) times daily.   [name] amLODipine (NORVASC) 10 MG tablet Take 10 mg by mouth daily.  [name] aspirin EC 81 MG tablet Take 81 mg by mouth daily.  [name]  atorvastatin (LIPITOR) 40 MG tablet Take 1 tablet (40 mg total) by mouth at bedtime. Keep October Appointment  . busPIRone (BUSPAR) 7.5 MG tablet Take 1 tablet by mouth 2 (two) times daily.  [name] ezetimibe (ZETIA) 10 MG tablet TAKE 1 TABLET BY MOUTH EVERY DAY (Patient taking differently: Take 10 mg by mouth daily. )  . famotidine (PEPCID) 20 MG tablet TAKE 1 TABLET (20 MG TOTAL) BY MOUTH DAILY. NEEDS APPOINTMENT FOR FUTURE REFILLS  . INCRUSE ELLIPTA 62.5 MCG/INH AEPB Inhale 1 puff into the lungs daily.   . Ipratropium-Albuterol (COMBIVENT RESPIMAT) 20-100 MCG/ACT AERS respimat Inhale 1 puff into the lungs every 6 (six) hours.  . isosorbide mononitrate (IMDUR) 60 MG 24 hr tablet TAKE 1 TABLET BY MOUTH EVERY DAY  . losartan-hydrochlorothiazide (HYZAAR) 100-12.5 MG tablet Take 1 tablet by mouth daily.  . metFORMIN (GLUCOPHAGE) 500 MG tablet TAKE 1 TABLET (500 MG TOTAL) BY MOUTH 2 (TWO) TIMES DAILY WITH A MEAL.  . metoprolol (TOPROL-XL) 200 MG 24 hr tablet TAKE 0.5 TABLETS (100 MG TOTAL) BY MOUTH 2 (TWO) TIMES DAILY.  . ranolazine (RANEXA) 1000 MG SR tablet TAKE 1 TABLET BY MOUTH TWICE A DAY  . TRELEGY ELLIPTA 100-62.5-25 MCG/INH AEPB Inhale 1 puff into the lungs daily.  . [DISCONTINUED] clopidogrel (PLAVIX) 75 MG tablet Take 1 tablet (75 mg total) by mouth daily. (Patient not taking: Reported on 06/23/2019)   No facility-administered encounter medications on file as of 06/28/2019.     Surgical History: Past Surgical History:  Procedure Laterality Date  . Arterial evalation      no evidence of ICA  stenosis  . CARDIAC CATHETERIZATION    . CARDIAC SURGERY    . CORONARY ARTERY BYPASS GRAFT  2006   Post CABG surgery with a LIMA to the LAD, a vein to the diagonal ,,a vein to the the obtuse marginal and vein to the PDA.  [name] CORONARY STENT PLACEMENT  2010  . EAR CYST EXCISION Right 10/05/2018   Procedure: Right Partial pinnectomy;  Surgeon: Izora Gala, MD;  Location: [place];  Service: ENT;  Laterality:  Right;  . GRAFT(S) ANGIOGRAM  10/21/2011   Procedure: GRAFT(S) Cyril Loosen;  Surgeon: Leonie Man, MD;  Location: [facility] CATH LAB;  Service: Cardiovascular;;  . LEFT HEART CATHETERIZATION WITH CORONARY ANGIOGRAM N/A 10/21/2011   Procedure: LEFT HEART CATHETERIZATION WITH CORONARY ANGIOGRAM;  Surgeon: Leonie Man, MD;  Location: [facility] CATH LAB;  Service: Cardiovascular;  Laterality: N/A;  . LYMPH NODE BIOPSY Right 10/05/2018   Procedure: sentinel NODE BIOPSY with right partial modified neck dissection;  Surgeon: Izora Gala, MD;  Location: [place];  Service: ENT;  Laterality: Right;    Medical History: Past Medical History:  Diagnosis Date  . Anxiety   . COPD (chronic obstructive pulmonary disease) ([place])   . Coronary artery disease 2006   Acute coronary syndrome in March 2006.   . Diabetes mellitus without complication ([place])   . Dyslipidemia   . Hypertension 10/09/2010   EF 40-45%  . 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 Sleep apnea    uses cpap machine    Family History: Family History  Problem Relation Age of Onset  . Hypertension Mother   . Heart attack Father 2    Social History: Social History   Socioeconomic History  . Marital status: Married    Spouse name: Not on file  . Number of children: Not on file  . Years of education: Not on file  . Highest education level: Not on file  Occupational History  . Not on file  Social Needs  . Financial resource strain: Not on file  . Food insecurity    Worry: Not on file    Inability: Not on file  . Transportation needs    Medical: Not on file    Non-medical: Not on file  Tobacco Use  . Smoking status: Current Every Day Smoker    Packs/day: 2.00    Years: 50.00    Pack years: 100.00    Types: Cigarettes  . Smokeless tobacco: Former Systems developer    Types: [place] date: 01/23/2013  Substance and Sexual Activity  . Alcohol use: No  . Drug use: Not Currently  . Sexual activity: Not on file  Lifestyle  . Physical  activity    Days per week: Not on file    Minutes per session: Not on file  . Stress: Not on file  Relationships  . Social Herbalist on phone: Not on file    Gets together: Not on file    Attends religious service: Not on file    Active member of club or organization: Not on file    Attends meetings of clubs or organizations: Not on file    Relationship status: Not on file  . Intimate partner violence    Fear of current or ex partner: Not on file    Emotionally abused: Not on file    Physically abused: Not on file    Forced sexual activity: Not on file  Other Topics Concern  .  Not on file  Social History Narrative  . Not on file    Vital Signs: Blood pressure 116/62, pulse 81, resp. rate 16, height 5\' 8"  (1.727 m), weight 181 lb (82.1 kg), SpO2 97 %.  Examination: General Appearance: The patient is well-developed, well-nourished, and in no distress. Skin: Gross inspection of skin unremarkable. Head: normocephalic, no gross deformities. Eyes: no gross deformities noted. ENT: ears appear grossly normal no exudates. Neck: Supple. No thyromegaly. No LAD. Respiratory: Scattered rhonchi are noted bilaterally.. Cardiovascular: Normal S1 and S2 without murmur or rub. Extremities: No cyanosis. pulses are equal. Neurologic: Alert and oriented. No involuntary movements.  LABS: Recent Results (from the past 2160 hour(s))  Glucose, capillary     Status: Abnormal   Collection Time: 06/10/19 11:17 AM  Result Value Ref Range   Glucose-Capillary 122 (H) 70 - 99 mg/dL  MYOCARDIAL PERFUSION IMAGING     Status: None   Collection Time: 06/16/19 11:24 AM  Result Value Ref Range   Rest HR 91 bpm   Rest BP 132/69 mmHg   Peak HR 105 bpm   Peak BP 142/62 mmHg   SSS 13    SRS 10    SDS 3    TID 0.92    LV sys vol 53 mL   LV dias vol 112 62 - 150 mL  SARS CORONAVIRUS 2 (TAT 6-24 HRS) Nasopharyngeal Nasopharyngeal Swab     Status: None   Collection Time: 06/18/19  1:04 PM    Specimen: Nasopharyngeal Swab  Result Value Ref Range   SARS Coronavirus 2 NEGATIVE NEGATIVE    Comment: (NOTE) SARS-CoV-2 target nucleic acids are NOT DETECTED. The SARS-CoV-2 RNA is generally detectable in upper and lower respiratory specimens during the acute phase of infection. Negative results do not preclude SARS-CoV-2 infection, do not rule out co-infections with other pathogens, and should not be used as the sole basis for treatment or other patient management decisions. Negative results must be combined with clinical observations, patient history, and epidemiological information. The expected result is Negative. Fact Sheet for Patients: SugarRoll.be Fact Sheet for Healthcare Providers: https://www.woods-mathews.com/ This test is not yet approved or cleared by the [place] FDA and  has been authorized for detection and/or diagnosis of SARS-CoV-2 by FDA under an Emergency Use Authorization (EUA). This EUA will remain  in effect (meaning this test can be used) for the duration of the COVID-19 declaration under Section 56 4(b)(1) of the Act, 21 U.S.C. section 360bbb-3(b)(1), unless the authorization is terminated or revoked sooner. Performed at [facility], [place] [address], [place], [place] [address]   CBC upon arrival     Status: Abnormal   Collection Time: 06/23/19 10:28 AM  Result Value Ref Range   WBC 12.4 (H) 4.0 - 10.5 K/uL   RBC 4.46 4.22 - 5.81 MIL/uL   Hemoglobin 12.8 (L) 13.0 - 17.0 g/dL   HCT 40.3 39.0 - 52.0 %   MCV 90.4 80.0 - 100.0 fL   MCH 28.7 26.0 - 34.0 pg   MCHC 31.8 30.0 - 36.0 g/dL   RDW 14.3 11.5 - 15.5 %   Platelets 346 150 - 400 K/uL   nRBC 0.0 0.0 - 0.2 %    Comment: Performed at [facility], [address] [place], [place] [address]  Protime-INR upon arrival     Status: None   Collection Time: 06/23/19 10:28 AM  Result Value Ref Range   Prothrombin Time 12.9 11.4 - 15.2 seconds  INR 1.0 0.8 - 1.2    Comment: (NOTE) INR goal varies based on device and disease states. Performed at [facility], [address] [place], [place] [address]   Glucose, capillary     Status: Abnormal   Collection Time: 06/23/19 10:28 AM  Result Value Ref Range   Glucose-Capillary 112 (H) 70 - 99 mg/dL  Surgical pathology     Status: None   Collection Time: 06/23/19 12:14 PM  Result Value Ref Range   SURGICAL PATHOLOGY      Surgical Pathology CASE: [id number] PATIENT: Willis Menken Surgical Pathology Report     SPECIMEN SUBMITTED: A. Lung mass, right  CLINICAL HISTORY: None provided  PRE-OPERATIVE DIAGNOSIS: FDG avid RUL nodule, probable cancer  POST-OPERATIVE DIAGNOSIS: None provided.     DIAGNOSIS: A. LUNG MASS, RIGHT, CT-GUIDED NEEDLE CORE BIOPSY: - DIAGNOSTIC OF MALIGNANCY. - LARGE CELL NEUROENDOCRINE CARCINOMA.  Comment: Results were communicated to Dr. Devona Konig via [facility] secure chat on 06/25/19.  Immunohistochemical stains were performed on block A1.  The tumor cells are positive for CD56 and patchy positive for TTF1.  They are negative for p40.  IHC slides were prepared by [facility], [place], [place]. All controls stained appropriately.  This test was developed and its performance characteristics determined by [facility]. It has not been cleared or approved by the [place] Food and Drug Administration. The FDA does not require this t est to go through premarket FDA review. This test is used for clinical purposes. It should not be regarded as investigational or for research. This laboratory is certified under the Clinical Laboratory Improvement Amendments (CLIA) as qualified to perform high complexity clinical laboratory testing.    GROSS DESCRIPTION: A. Labeled: Right lung mass Received: In formalin Tissue fragment(s): 3 Size: Ranging from 0.6-1.1 cm in length and 0.1 cm in diameter Description: White  soft tissue cores Entirely submitted in 1 cassette.   Final Diagnosis performed by Betsy Pries, MD.   Electronically signed 06/25/2019 1:24:51PM The electronic signature indicates that the named Attending Pathologist has evaluated the specimen  Technical component performed at [facility], [address] [place], [place] [address] Lab: [phone] Dir: Rush Farmer, MD, MMM  Professional component performed at [facility], [facility], [place], [place],  [address] [place] ab: [phone] Dir: Dellia Nims. Reuel Derby, MD     Radiology: Ct Biopsy  Result Date: 06/23/2019 INDICATION: 68 year old male with a history of FDG avid right lung nodule EXAM: CT BIOPSY MEDICATIONS: None. ANESTHESIA/SEDATION: Moderate (conscious) sedation was employed during this procedure. A total of Versed 2.0 mg and Fentanyl 100 mcg was administered intravenously. Moderate Sedation Time: 15 minutes. The patient's level of consciousness and vital signs were monitored continuously by radiology nursing throughout the procedure under my direct supervision. FLUOROSCOPY TIME:  CT COMPLICATIONS: None PROCEDURE: The procedure, risks, benefits, and alternatives were explained to the patient and the patient's family. Specific risks that were addressed included bleeding, infection, pneumothorax, need for further procedure including chest tube placement, chance of delayed pneumothorax or hemorrhage, hemoptysis, nondiagnostic sample, cardiopulmonary collapse, death. Questions regarding the procedure were encouraged and answered. The patient understands and consents to the procedure. Patient was positioned in the supine position on the CT gantry table and a scout CT of the chest was performed for planning purposes. Once angle of approach was determined, the skin and subcutaneous tissues this scan was prepped and draped in the usual sterile fashion, and a sterile drape was applied covering the operative  field. A sterile  gown and sterile gloves were used for the procedure. Local anesthesia was provided with 1% Lidocaine. The skin and subcutaneous tissues were infiltrated 1% lidocaine for local anesthesia, and a small stab incision was made with an 11 blade scalpel. Using CT guidance, a 17 gauge trocar needle was advanced into the right upper lobetarget. After confirmation of the tip, separate 18 gauge core biopsies were performed. These were placed into solution for transportation to the lab. Biosentry Device was deployed. A final CT image was performed. Patient tolerated the procedure well and remained hemodynamically stable throughout. No complications were encountered and no significant blood loss was encounter IMPRESSION: Status post CT-guided biopsy of right upper lobe nodule. Tissue specimen sent to pathology for complete histopathologic analysis. Signed, Dulcy Fanny. Dellia Nims, RPVI [facility] Radiology Electronically Signed   By: Corrie Mckusick D.O.   On: 06/23/2019 12:39   Dg Chest Port 1 View  Result Date: 06/23/2019 CLINICAL DATA:  68 year old male with a history of right lung nodule biopsy EXAM: PORTABLE CHEST 1 VIEW COMPARISON:  PET-CT 06/10/2019, chest x-ray 06/23/2019 at 3:08 p.m. FINDINGS: Cardiomediastinal silhouette unchanged in size and contour. Surgical changes of median sternotomy and CABG. Small right pneumothorax, unchanged from the comparison. Gas along the right lateral chest wall again noted as well as the nodule of the right lung. Chronic lung changes. IMPRESSION: Unchanged size of small right-sided pneumothorax. Electronically Signed   By: Corrie Mckusick D.O.   On: 06/23/2019 15:03   Dg Chest Port 1 View  Result Date: 06/23/2019 CLINICAL DATA:  68 year old male with a history of right lung nodule biopsy EXAM: PORTABLE CHEST 1 VIEW COMPARISON:  PET-CT 06/10/2019 FINDINGS: Cardiomediastinal silhouette unchanged in size and contour. Surgical changes of  median sternotomy and CABG. Calcifications of the aortic arch. Small right pneumothorax. Nodule in the right lateral lung corresponding to the right lung nodule. Gas along the right chest wall soft tissues. IMPRESSION: Small right pneumothorax, with gas along the right lateral chest wall corresponding to recent biopsy. Electronically Signed   By: Corrie Mckusick D.O.   On: 06/23/2019 15:00    No results found.  Nm Pet Image Initial (pi) Skull Base To Thigh  Result Date: 06/10/2019 CLINICAL DATA:  Initial treatment strategy for right-sided pulmonary nodule. No prior chemotherapy or radiation therapy. History of head neck cancer. EXAM: NUCLEAR MEDICINE PET SKULL BASE TO THIGH TECHNIQUE: 10.0 mCi F-18 FDG was injected intravenously. Full-ring PET imaging was performed from the skull base to thigh after the radiotracer. CT data was obtained and used for attenuation correction and anatomic localization. Fasting blood glucose: 122 mg/dl COMPARISON:  Chest radiograph 12/27/2016 FINDINGS: Mediastinal blood pool activity: SUV max 2.6 Liver activity: SUV max NA NECK: No areas of abnormal hypermetabolism. Incidental CT findings: Bilateral carotid atherosclerosis. Cerebral atrophy. CHEST: Right upper lobe pulmonary nodule is relatively well-circumscribed and measures 2.0 cm. Corresponds to hypermetabolism at a S.U.V. max of 10.7 on image 96/609. No mediastinal hypermetabolism. There is equivocal right infrahilar hypermetabolism, including at a S.U.V. max of 3.1 on image 107/609. Incidental CT findings: Median sternotomy for CABG. Aortic atherosclerosis. Moderate centrilobular emphysema. Nonspecific tree-in-bud nodularity is basilar predominant and mild bilaterally. A 3 mm right lower lobe pulmonary nodule on 114/609. 6 mm right lower lobe pulmonary nodule on image 111. Left lower lobe scarring ABDOMEN/PELVIS: Left adrenal mass measures 2.8 x 2.9 cm and is low-density, consistent with an adenoma. This has heterogeneous  hypermetabolism within, including  at a S.U.V. max of 3.1. The inter and lower pole region of the right kidney is replaced by a heterogeneous solid mass, including at 9.7 x 9.0 cm on 189/609. This corresponds to hypermetabolism at a S.U.V. max of 7.6. Incidental CT findings: Abdominal aortic atherosclerosis. Mild right adrenal nodularity, also likely due to an adenoma. Upper pole right renal 3.6 cm fluid density lesion is likely a cyst. 7 mm pre caval node is not pathologic by CT size criteria. Saccular outpouching of the suprarenal aorta of 1.0 cm, at the 4 o'clock position on 10767/609. Mild prostatomegaly. SKELETON: No abnormal marrow activity. Incidental CT findings: none IMPRESSION: 1. Hypermetabolic right upper lobe pulmonary nodule, favored to represent primary bronchogenic carcinoma. Isolated metastatic disease from 1 of the patient's primaries felt less likely. 2. Equivocal right infrahilar nodal hypermetabolism. Consider correlation with contrast-enhanced chest CT to evaluate for adenopathy in this area. 3. Right renal mass, consistent with renal cell carcinoma. 4. Pulmonary nodules, for which metastasis cannot be excluded. 5. Dominant left adrenal adenoma. Probable smaller right adrenal adenoma. 6. Aortic atherosclerosis (ICD10-I70.0) and emphysema (ICD10-J43.9). 7. Tree-in-bud nodularity at the lung bases is suspicious for atypical infection or aspiration. These results will be called to the ordering clinician or representative by the Radiologist Assistant, and communication documented in the PACS or zVision Dashboard. Electronically Signed   By: Abigail Miyamoto M.D.   On: 06/10/2019 15:57   Ct Biopsy  Result Date: 06/23/2019 INDICATION: 68 year old male with a history of FDG avid right lung nodule EXAM: CT BIOPSY MEDICATIONS: None. ANESTHESIA/SEDATION: Moderate (conscious) sedation was employed during this procedure. A total of Versed 2.0 mg and Fentanyl 100 mcg was administered intravenously. Moderate  Sedation Time: 15 minutes. The patient's level of consciousness and vital signs were monitored continuously by radiology nursing throughout the procedure under my direct supervision. FLUOROSCOPY TIME:  CT COMPLICATIONS: None PROCEDURE: The procedure, risks, benefits, and alternatives were explained to the patient and the patient's family. Specific risks that were addressed included bleeding, infection, pneumothorax, need for further procedure including chest tube placement, chance of delayed pneumothorax or hemorrhage, hemoptysis, nondiagnostic sample, cardiopulmonary collapse, death. Questions regarding the procedure were encouraged and answered. The patient understands and consents to the procedure. Patient was positioned in the supine position on the CT gantry table and a scout CT of the chest was performed for planning purposes. Once angle of approach was determined, the skin and subcutaneous tissues this scan was prepped and draped in the usual sterile fashion, and a sterile drape was applied covering the operative field. A sterile gown and sterile gloves were used for the procedure. Local anesthesia was provided with 1% Lidocaine. The skin and subcutaneous tissues were infiltrated 1% lidocaine for local anesthesia, and a small stab incision was made with an 11 blade scalpel. Using CT guidance, a 17 gauge trocar needle was advanced into the right upper lobetarget. After confirmation of the tip, separate 18 gauge core biopsies were performed. These were placed into solution for transportation to the lab. Biosentry Device was deployed. A final CT image was performed. Patient tolerated the procedure well and remained hemodynamically stable throughout. No complications were encountered and no significant blood loss was encounter IMPRESSION: Status post CT-guided biopsy of right upper lobe nodule. Tissue specimen sent to pathology for complete histopathologic analysis. Signed, Dulcy Fanny. Dellia Nims, RPVI Vascular and  Interventional Radiology Specialists [facility] Radiology Electronically Signed   By: Corrie Mckusick D.O.   On: 06/23/2019 12:39   Dg Chest [facility]  1 View  Result Date: 06/23/2019 CLINICAL DATA:  68 year old male with a history of right lung nodule biopsy EXAM: PORTABLE CHEST 1 VIEW COMPARISON:  PET-CT 06/10/2019, chest x-ray 06/23/2019 at 3:08 p.m. FINDINGS: Cardiomediastinal silhouette unchanged in size and contour. Surgical changes of median sternotomy and CABG. Small right pneumothorax, unchanged from the comparison. Gas along the right lateral chest wall again noted as well as the nodule of the right lung. Chronic lung changes. IMPRESSION: Unchanged size of small right-sided pneumothorax. Electronically Signed   By: Corrie Mckusick D.O.   On: 06/23/2019 15:03   Dg Chest Port 1 View  Result Date: 06/23/2019 CLINICAL DATA:  68 year old male with a history of right lung nodule biopsy EXAM: PORTABLE CHEST 1 VIEW COMPARISON:  PET-CT 06/10/2019 FINDINGS: Cardiomediastinal silhouette unchanged in size and contour. Surgical changes of median sternotomy and CABG. Calcifications of the aortic arch. Small right pneumothorax. Nodule in the right lateral lung corresponding to the right lung nodule. Gas along the right chest wall soft tissues. IMPRESSION: Small right pneumothorax, with gas along the right lateral chest wall corresponding to recent biopsy. Electronically Signed   By: Corrie Mckusick D.O.   On: 06/23/2019 15:00      Assessment and Plan: Patient Active Problem List   Diagnosis Date Noted  . 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 Melanoma in situ of ear, right ([place]) 10/05/2018  . Obesity (BMI 30.0-34.9) 09/05/2014  . Smoking 10/21/2011  . Unstable angina ([place]) 10/18/2011  . S/P CABG x 4, 2006.PCI 11/10, cath this adm- Med Rx 10/18/2011  . HTN (hypertension) 10/18/2011  . Dyslipidemia 10/18/2011  . COPD (chronic obstructive pulmonary disease) ([place]) 10/18/2011  . Cardiomyopathy, ischemic, EF 40-45% 2D  11/10 10/18/2011    1. Lung Cancer patient has been biopsied and is positive patient will be referred to oncology.  I believe he had a neuroendocrine tumor according to my conversation with the pathologist the treatment options as already noted above were discussed with the patient and the family.  Patient will be referred to the cancer center for further evaluation assessment and treatment. 2. COPD again does have COPD however he continues to smoke.  Patient medications will be optimized.  We will continue with being encouraged to stop smoking.  Patient family members and states that he is smoking well over a pack a day at this point 3. Smoker as above smoking cessation counseling was provided.  Patient definitely needs to stop smoking this will certainly help with any treatment interventions that are planned for his underlying malignancy.  General Counseling: I have discussed the findings of the evaluation and examination with Jeneen Rinks.  I have also discussed any further diagnostic evaluation thatmay be needed or ordered today. Dsean verbalizes understanding of the findings of todays visit. We also reviewed his medications today and discussed drug interactions and side effects including but not limited excessive drowsiness and altered mental states. We also discussed that there is always a risk not just to him but also people around him. he has been encouraged to call the office with any questions or concerns that should arise related to todays visit.    Time spent: 25 minutes  I have personally obtained a history, examined the patient, evaluated laboratory and imaging results, formulated the assessment and plan and placed orders.    Allyne Gee, MD [facility] Sleep medicine

## 2019-06-28 NOTE — Patient Instructions (Signed)
Pulmonary Nodule A pulmonary nodule is tissue that has grown on your lung. A nodule may be cancer, but most nodules are not cancer. Follow these instructions at home:   Take over-the-counter and prescription medicines only as told by your doctor.  Do not use any products that have nicotine or tobacco, such as cigarettes and e-cigarettes. If you need help quitting, ask your doctor.  Keep all follow-up visits as told by your doctor. This is important. Contact a doctor if:  You have trouble breathing when doing activities.  You feel sick.  You feel more tired than normal.  You do not feel like eating.  You lose weight without trying.  You have chills.  You have night sweats. Get help right away if:  You cannot catch your breath.  You start making whistling sounds when breathing (wheezing).  You cannot stop coughing.  You cough up blood.  You get dizzy.  You feel like you are going to pass out (faint).  You have sudden chest pain.  You have a fever or symptoms for more than 2-3 days.  You have a fever and your symptoms suddenly get worse. Summary  A pulmonary nodule is tissue that has grown on your lung.  Most nodules are not cancer.  Your doctor will do tests to know what kind of nodule you have, and whether you need treatment for it. This information is not intended to replace advice given to you by your health care provider. Make sure you discuss any questions you have with your health care provider. Document Released: 11/02/2010 Document Revised: 10/24/2017 Document Reviewed: 10/29/2016 Elsevier Patient Education  2020 [employer].

## 2019-06-28 NOTE — Progress Notes (Signed)
  Oncology Nurse Navigator Documentation  Navigator Location: [facility] (06/28/19 1400) Referral Date to RadOnc/MedOnc: 06/28/19 (06/28/19 1400) )Navigator Encounter Type: Introductory Phone Call (06/28/19 1400)   Abnormal Finding Date: 05/28/19 (06/28/19 1400) Confirmed Diagnosis Date: 06/25/19 (06/28/19 1400)                 Treatment Phase: Pre-Tx/Tx Discussion (06/28/19 1400) Barriers/Navigation Needs: Coordination of Care (06/28/19 1400)   Interventions: Coordination of Care (06/28/19 1400)   Coordination of Care: Appts;Radiology (06/28/19 1400)        Acuity: Level 2 (06/28/19 1400)   Acuity Level 2: Initial guidance, education and coordination as needed;Educational needs;Assistance expediting appointments (06/28/19 1400)  phone call made to patient to introduce to navigator services and review upcoming appts. All questions answered during visit. Pt informed of appt scheduled with Dr. Janese Banks on Friday 9/18 at 9:30am. Informed pt and family regarding visitor restrictions. Contact info given and instructed to call with any further questions or needs. Pt and family verbalized understanding. Nothing further needed at this time.    Time Spent with Patient: 30 (06/28/19 1400)

## 2019-06-30 ENCOUNTER — Ambulatory Visit
Admission: RE | Admit: 2019-06-30 | Discharge: 2019-06-30 | Disposition: A | Discharge status: Home / Self Care | Payer: Self-pay | Source: Ambulatory Visit | Attending: Oncology | Admitting: Oncology

## 2019-06-30 ENCOUNTER — Other Ambulatory Visit: Payer: Self-pay | Admitting: Oncology

## 2019-06-30 DIAGNOSIS — C801 Malignant (primary) neoplasm, unspecified: Secondary | ICD-10-CM

## 2019-07-01 ENCOUNTER — Other Ambulatory Visit: Payer: Self-pay

## 2019-07-01 ENCOUNTER — Other Ambulatory Visit: Payer: Medicare Other

## 2019-07-01 NOTE — Progress Notes (Signed)
Tumor Board Documentation  Troy [name] was presented by Norvel Richards, RN at our Tumor Board on 07/01/2019, which included representatives from medical oncology, radiation oncology, pathology, radiology, surgical, navigation, internal medicine, pharmacy, research, palliative care.  Troy [name] currently presents as a new patient, for [place], for new positive pathology with history of the following treatments: active survellience, surgical intervention(s).  Additionally, we reviewed previous medical and familial history, history of present illness, and recent lab results along with all available histopathologic and imaging studies. The tumor board considered available treatment options and made the following recommendations: Additional screening, Concurrent chemo-radiation therapy Needs a contrasted CT  The following procedures/referrals were also placed: No orders of the defined types were placed in this encounter.   Clinical Trial Status: not discussed   Staging used: To be determined  AJCC Staging:       Group: Large Cell NET of Lung   National site-specific guidelines   were discussed with respect to the case.  Tumor board is a meeting of clinicians from various specialty areas who evaluate and discuss patients for whom a multidisciplinary approach is being considered. Final determinations in the plan of care are those of the provider(s). The responsibility for follow up of recommendations given during tumor board is that of the provider.   Today's extended care, comprehensive team conference, Troy [name] was not present for the discussion and was not examined.   Multidisciplinary Tumor Board is a multidisciplinary case peer review process.  Decisions discussed in the Multidisciplinary Tumor Board reflect the opinions of the specialists present at the conference without having examined the patient.  Ultimately, treatment and diagnostic decisions rest with the primary provider(s) and the patient.

## 2019-07-02 ENCOUNTER — Encounter: Payer: Self-pay | Admitting: Oncology

## 2019-07-02 ENCOUNTER — Other Ambulatory Visit: Payer: Self-pay

## 2019-07-02 ENCOUNTER — Ambulatory Visit
Admission: RE | Admit: 2019-07-02 | Discharge: 2019-07-02 | Disposition: A | Discharge status: Home / Self Care | Payer: Medicare Other | Source: Ambulatory Visit | Attending: Radiation Oncology | Admitting: Radiation Oncology

## 2019-07-02 ENCOUNTER — Encounter (INDEPENDENT_AMBULATORY_CARE_PROVIDER_SITE_OTHER): Payer: Self-pay

## 2019-07-02 ENCOUNTER — Other Ambulatory Visit: Payer: Self-pay | Admitting: *Deleted

## 2019-07-02 ENCOUNTER — Inpatient Hospital Stay: Payer: Medicare Other | Attending: Oncology | Admitting: Oncology

## 2019-07-02 ENCOUNTER — Ambulatory Visit: Payer: Medicare Other

## 2019-07-02 VITALS — BP 136/74 | HR 81 | Temp 97.9°F | Resp 16 | Ht 68.0 in | Wt 181.3 lb

## 2019-07-02 DIAGNOSIS — Z515 Encounter for palliative care: Secondary | ICD-10-CM | POA: Insufficient documentation

## 2019-07-02 DIAGNOSIS — J449 Chronic obstructive pulmonary disease, unspecified: Secondary | ICD-10-CM | POA: Diagnosis not present

## 2019-07-02 DIAGNOSIS — Z79899 Other long term (current) drug therapy: Secondary | ICD-10-CM | POA: Insufficient documentation

## 2019-07-02 DIAGNOSIS — Z85820 Personal history of malignant melanoma of skin: Secondary | ICD-10-CM | POA: Insufficient documentation

## 2019-07-02 DIAGNOSIS — R5383 Other fatigue: Secondary | ICD-10-CM | POA: Insufficient documentation

## 2019-07-02 DIAGNOSIS — C3411 Malignant neoplasm of upper lobe, right bronchus or lung: Secondary | ICD-10-CM | POA: Diagnosis not present

## 2019-07-02 DIAGNOSIS — Z7982 Long term (current) use of aspirin: Secondary | ICD-10-CM | POA: Insufficient documentation

## 2019-07-02 DIAGNOSIS — D3502 Benign neoplasm of left adrenal gland: Secondary | ICD-10-CM | POA: Diagnosis not present

## 2019-07-02 DIAGNOSIS — Z7984 Long term (current) use of oral hypoglycemic drugs: Secondary | ICD-10-CM | POA: Diagnosis not present

## 2019-07-02 DIAGNOSIS — I2510 Atherosclerotic heart disease of native coronary artery without angina pectoris: Secondary | ICD-10-CM | POA: Insufficient documentation

## 2019-07-02 DIAGNOSIS — N2889 Other specified disorders of kidney and ureter: Secondary | ICD-10-CM | POA: Insufficient documentation

## 2019-07-02 DIAGNOSIS — Z951 Presence of aortocoronary bypass graft: Secondary | ICD-10-CM | POA: Insufficient documentation

## 2019-07-02 DIAGNOSIS — Z7189 Other specified counseling: Secondary | ICD-10-CM

## 2019-07-02 DIAGNOSIS — G4730 Sleep apnea, unspecified: Secondary | ICD-10-CM | POA: Diagnosis not present

## 2019-07-02 DIAGNOSIS — E1122 Type 2 diabetes mellitus with diabetic chronic kidney disease: Secondary | ICD-10-CM | POA: Insufficient documentation

## 2019-07-02 DIAGNOSIS — C7A1 Malignant poorly differentiated neuroendocrine tumors: Secondary | ICD-10-CM | POA: Insufficient documentation

## 2019-07-02 DIAGNOSIS — E785 Hyperlipidemia, unspecified: Secondary | ICD-10-CM | POA: Insufficient documentation

## 2019-07-02 DIAGNOSIS — E119 Type 2 diabetes mellitus without complications: Secondary | ICD-10-CM | POA: Insufficient documentation

## 2019-07-02 DIAGNOSIS — I10 Essential (primary) hypertension: Secondary | ICD-10-CM | POA: Insufficient documentation

## 2019-07-02 DIAGNOSIS — F17210 Nicotine dependence, cigarettes, uncomplicated: Secondary | ICD-10-CM | POA: Insufficient documentation

## 2019-07-02 DIAGNOSIS — I130 Hypertensive heart and chronic kidney disease with heart failure and stage 1 through stage 4 chronic kidney disease, or unspecified chronic kidney disease: Secondary | ICD-10-CM | POA: Insufficient documentation

## 2019-07-02 DIAGNOSIS — R5381 Other malaise: Secondary | ICD-10-CM | POA: Insufficient documentation

## 2019-07-02 DIAGNOSIS — C7A8 Other malignant neuroendocrine tumors: Secondary | ICD-10-CM

## 2019-07-02 DIAGNOSIS — F419 Anxiety disorder, unspecified: Secondary | ICD-10-CM | POA: Insufficient documentation

## 2019-07-02 DIAGNOSIS — Z85828 Personal history of other malignant neoplasm of skin: Secondary | ICD-10-CM | POA: Diagnosis not present

## 2019-07-02 LAB — CBC WITH DIFFERENTIAL/PLATELET
Abs Immature Granulocytes: 0.03 10*3/uL (ref 0.00–0.07)
Basophils Absolute: 0.1 10*3/uL (ref 0.0–0.1)
Basophils Relative: 1 %
Eosinophils Absolute: 0.1 10*3/uL (ref 0.0–0.5)
Eosinophils Relative: 1 %
HCT: 39.5 % (ref 39.0–52.0)
Hemoglobin: 12.7 g/dL — ABNORMAL LOW (ref 13.0–17.0)
Immature Granulocytes: 0 %
Lymphocytes Relative: 17 %
Lymphs Abs: 2.1 10*3/uL (ref 0.7–4.0)
MCH: 28.9 pg (ref 26.0–34.0)
MCHC: 32.2 g/dL (ref 30.0–36.0)
MCV: 89.8 fL (ref 80.0–100.0)
Monocytes Absolute: 1.1 10*3/uL — ABNORMAL HIGH (ref 0.1–1.0)
Monocytes Relative: 9 %
Neutro Abs: 9.4 10*3/uL — ABNORMAL HIGH (ref 1.7–7.7)
Neutrophils Relative %: 72 %
Platelets: 331 10*3/uL (ref 150–400)
RBC: 4.4 MIL/uL (ref 4.22–5.81)
RDW: 14.3 % (ref 11.5–15.5)
WBC: 12.9 10*3/uL — ABNORMAL HIGH (ref 4.0–10.5)
nRBC: 0 % (ref 0.0–0.2)

## 2019-07-02 LAB — COMPREHENSIVE METABOLIC PANEL
ALT: 14 U/L (ref 0–44)
AST: 14 U/L — ABNORMAL LOW (ref 15–41)
Albumin: 4 g/dL (ref 3.5–5.0)
Alkaline Phosphatase: 100 U/L (ref 38–126)
Anion gap: 8 (ref 5–15)
BUN: 11 mg/dL (ref 8–23)
CO2: 28 mmol/L (ref 22–32)
Calcium: 9.3 mg/dL (ref 8.9–10.3)
Chloride: 103 mmol/L (ref 98–111)
Creatinine, Ser: 1.27 mg/dL — ABNORMAL HIGH (ref 0.61–1.24)
GFR calc Af Amer: >60 mL/min (ref >60)
GFR calc non Af Amer: 57 mL/min — ABNORMAL LOW (ref >60)
Glucose, Bld: 118 mg/dL — ABNORMAL HIGH (ref 70–99)
Potassium: 4.1 mmol/L (ref 3.5–5.1)
Sodium: 139 mmol/L (ref 135–145)
Total Bilirubin: 0.9 mg/dL (ref 0.3–1.2)
Total Protein: 8 g/dL (ref 6.5–8.1)

## 2019-07-02 NOTE — Consult Note (Signed)
NEW PATIENT EVALUATION  Name: Troy [name]  MRN: [id number]  Date:   07/02/2019     DOB: Sep 04, 1951   This 68 y.o. 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  REFERRING PHYSICIAN: [name], MD  CHIEF COMPLAINT:  Chief Complaint  Patient presents with  . Lung Cancer    Initial consultation    DIAGNOSIS: The encounter diagnosis was Large cell neuroendocrine carcinoma ([place]).   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  [facility] had a CT scan of the chest showing a nodule in the right upper lobe.  PET/CT CAT scan confirmed hypermetabolic to be in the right upper lobe lung nodule a T1 lesion as well as probable right hilar nodal involvement with hypermetabolic activity.  CT-guided biopsy was positive for large neuroendocrine carcinoma.  Patient was presented at our tumor conference based on the pathology which is similar small cell lung cancer treatment favored concurrent chemoradiation.  He is seen today for radiation oncology opinion.  He is doing fairly well specifically denies hemoptysis chest tightness or dysphasia does have a chronic nonproductive cough.  Patient has a significant past smoking history.  He still has pain in his lower lip with an obvious significant squamous cell carcinoma involved.  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    PAST SURGICAL HISTORY:  Past Surgical History:  Procedure Laterality Date  . Arterial evalation      no evidence of ICA stenosis  . CARDIAC CATHETERIZATION    . CARDIAC SURGERY    . CORONARY ARTERY BYPASS GRAFT  2006   Post CABG surgery with a LIMA to the LAD, a vein to the diagonal ,,a vein to the the obtuse marginal and vein to the PDA.  [name] CORONARY STENT PLACEMENT  2010  . EAR CYST EXCISION Right 10/05/2018   Procedure: Right Partial pinnectomy;  Surgeon: Izora Gala, MD;  Location: [facility];  Service: ENT;  Laterality: Right;  . GRAFT(S) ANGIOGRAM  10/21/2011   Procedure: GRAFT(S) Cyril Loosen;  Surgeon: Leonie Man, MD;  Location: [facility] CATH LAB;  Service: Cardiovascular;;  . LEFT HEART CATHETERIZATION WITH CORONARY ANGIOGRAM N/A 10/21/2011   Procedure: LEFT HEART CATHETERIZATION WITH CORONARY ANGIOGRAM;  Surgeon: Leonie Man, MD;  Location: [facility] CATH LAB;  Service: Cardiovascular;  Laterality: N/A;  . LYMPH NODE BIOPSY Right 10/05/2018   Procedure: sentinel NODE BIOPSY with right partial modified neck dissection;  Surgeon: Izora Gala, MD;  Location: [facility];  Service: ENT;  Laterality: Right;    FAMILY HISTORY: family history includes Heart attack in his mother; Heart attack (age of onset: 13) in his father; Hypertension in his mother.  SOCIAL HISTORY:  reports that he has been smoking cigarettes. He has a 100.00 pack-year smoking history. He quit smokeless tobacco use about 6 years ago.  His smokeless tobacco use included chew. He reports previous drug use. He reports that he does not drink alcohol.  ALLERGIES: Codeine  and Niacin and related  MEDICATIONS:  Current Outpatient Medications  Medication Sig Dispense Refill  . ALPRAZolam (XANAX) 0.25 MG tablet Take 0.25 mg by mouth 3 (three) times daily.     [name] amLODipine (NORVASC) 10 MG tablet Take 10 mg by mouth daily.    [name] aspirin EC 81 MG tablet Take 81 mg by mouth daily.    [name] atorvastatin (LIPITOR) 40 MG  tablet Take 1 tablet (40 mg total) by mouth at bedtime. Keep October Appointment 90 tablet 0  . busPIRone (BUSPAR) 7.5 MG tablet Take 1 tablet by mouth 2 (two) times daily.    [name] ezetimibe (ZETIA) 10 MG tablet TAKE 1 TABLET BY MOUTH EVERY DAY (Patient taking differently: Take 10 mg by mouth daily. ) 90 tablet 3  . famotidine (PEPCID) 20 MG tablet TAKE 1 TABLET (20 MG TOTAL) BY MOUTH DAILY. NEEDS APPOINTMENT FOR FUTURE REFILLS 30 tablet 3  . INCRUSE ELLIPTA 62.5 MCG/INH AEPB Inhale 1 puff into the lungs daily.   2  . Ipratropium-Albuterol (COMBIVENT RESPIMAT) 20-100 MCG/ACT AERS respimat Inhale 1 puff into the lungs every 6 (six) hours.    . isosorbide mononitrate (IMDUR) 60 MG 24 hr tablet TAKE 1 TABLET BY MOUTH EVERY DAY 90 tablet 1  . losartan-hydrochlorothiazide (HYZAAR) 100-12.5 MG tablet Take 1 tablet by mouth daily. 30 tablet 10  . metFORMIN (GLUCOPHAGE) 500 MG tablet TAKE 1 TABLET (500 MG TOTAL) BY MOUTH 2 (TWO) TIMES DAILY WITH A MEAL. 180 tablet 0  . nitroGLYCERIN (NITROSTAT) 0.4 MG SL tablet Place 0.4 mg under the tongue every 5 (five) minutes as needed for chest pain.    . ranolazine (RANEXA) 1000 MG SR tablet TAKE 1 TABLET BY MOUTH TWICE A DAY 60 tablet 11   No current facility-administered medications for this encounter.     ECOG PERFORMANCE STATUS:  0 - Asymptomatic  REVIEW OF SYSTEMS: Except for the lip cancer Patient denies any weight loss, fatigue, weakness, fever, chills or night sweats. Patient denies any loss of vision, blurred vision. Patient denies any ringing  of the ears or hearing loss. No irregular heartbeat. Patient denies heart murmur or history of fainting. Patient denies any chest pain or pain radiating to her upper extremities. Patient denies any shortness of breath, difficulty breathing at night, cough or hemoptysis. Patient denies any swelling in the lower legs. Patient denies any nausea vomiting, vomiting of blood, or coffee ground material in the vomitus. Patient  denies any stomach pain. Patient states has had normal bowel movements no significant constipation or diarrhea. Patient denies any dysuria, hematuria or significant nocturia. Patient denies any problems walking, swelling in the joints or loss of balance. Patient denies any skin changes, loss of hair or loss of weight. Patient denies any excessive worrying or anxiety or significant depression. Patient denies any problems with insomnia. Patient denies excessive thirst, polyuria, polydipsia. Patient denies any swollen glands, patient denies easy bruising or easy bleeding. Patient denies any recent infections, allergies or URI. Patient "s visual fields have not changed significantly in recent time.   PHYSICAL EXAM: There were no vitals taken for this visit. Patient has obvious ulcerative lower lip squamous cell carcinoma.  Well-developed well-nourished patient in NAD. HEENT reveals PERLA, EOMI, discs not visualized.  Oral cavity is clear. No oral mucosal lesions are identified. Neck is clear without evidence of cervical or supraclavicular adenopathy. Lungs are clear to A&P. Cardiac examination is essentially unremarkable with regular rate and rhythm without murmur rub or thrill. Abdomen is  benign with no organomegaly or masses noted. Motor sensory and DTR levels are equal and symmetric in the upper and lower extremities. Cranial nerves II through XII are grossly intact. Proprioception is intact. No peripheral adenopathy or edema is identified. No motor or sensory levels are noted. Crude visual fields are within normal range.  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  He is also having a brain  MRI to rule out brain metastasis.  I would favor concurrent chemoradiation.  I will plan on delivering 6600 cGy to the primary tumor.  I would treat his selective right hilar lymph nodes to 6000 cGy using IMRT dose painting technique.  This will be done with concurrent chemotherapy.  There will be extra effort by both professional staff as well as technical staff to coordinate and manage concurrent chemoradiation and ensuing side effects during his treatments. Risks and benefits of treatment including possibility development of worsening cough fatigue alteration of blood counts skin reaction all were discussed in detail.  Patient has been offered radical surgery of his lip although I have told the family I can treat this with electron-beam radiation therapy although I like to get started on his lung treatments as soon as possible.  I have personally set up and ordered CT simulation for early next week.  Patient comprehends my treatment plan well.  Case was presented at our weekly tumor board.  I would like to take this opportunity to thank you for allowing me to participate in the care of your patient.Noreene Filbert, MD

## 2019-07-05 ENCOUNTER — Inpatient Hospital Stay (HOSPITAL_BASED_OUTPATIENT_CLINIC_OR_DEPARTMENT_OTHER): Payer: Medicare Other | Admitting: Hospice and Palliative Medicine

## 2019-07-05 ENCOUNTER — Encounter: Payer: Self-pay | Admitting: Oncology

## 2019-07-05 ENCOUNTER — Telehealth: Payer: Self-pay | Admitting: *Deleted

## 2019-07-05 ENCOUNTER — Inpatient Hospital Stay: Payer: Medicare Other

## 2019-07-05 ENCOUNTER — Other Ambulatory Visit: Payer: Self-pay

## 2019-07-05 DIAGNOSIS — Z7982 Long term (current) use of aspirin: Secondary | ICD-10-CM | POA: Diagnosis not present

## 2019-07-05 DIAGNOSIS — Z515 Encounter for palliative care: Secondary | ICD-10-CM | POA: Diagnosis not present

## 2019-07-05 DIAGNOSIS — R5383 Other fatigue: Secondary | ICD-10-CM | POA: Diagnosis not present

## 2019-07-05 DIAGNOSIS — I2510 Atherosclerotic heart disease of native coronary artery without angina pectoris: Secondary | ICD-10-CM | POA: Diagnosis not present

## 2019-07-05 DIAGNOSIS — E785 Hyperlipidemia, unspecified: Secondary | ICD-10-CM | POA: Diagnosis not present

## 2019-07-05 DIAGNOSIS — Z7984 Long term (current) use of oral hypoglycemic drugs: Secondary | ICD-10-CM | POA: Diagnosis not present

## 2019-07-05 DIAGNOSIS — C7A8 Other malignant neuroendocrine tumors: Secondary | ICD-10-CM | POA: Diagnosis not present

## 2019-07-05 DIAGNOSIS — Z7189 Other specified counseling: Secondary | ICD-10-CM | POA: Insufficient documentation

## 2019-07-05 DIAGNOSIS — E1122 Type 2 diabetes mellitus with diabetic chronic kidney disease: Secondary | ICD-10-CM | POA: Diagnosis not present

## 2019-07-05 DIAGNOSIS — C3411 Malignant neoplasm of upper lobe, right bronchus or lung: Secondary | ICD-10-CM | POA: Insufficient documentation

## 2019-07-05 DIAGNOSIS — G4730 Sleep apnea, unspecified: Secondary | ICD-10-CM | POA: Diagnosis not present

## 2019-07-05 DIAGNOSIS — N2889 Other specified disorders of kidney and ureter: Secondary | ICD-10-CM | POA: Diagnosis not present

## 2019-07-05 DIAGNOSIS — Z85828 Personal history of other malignant neoplasm of skin: Secondary | ICD-10-CM | POA: Diagnosis not present

## 2019-07-05 DIAGNOSIS — Z951 Presence of aortocoronary bypass graft: Secondary | ICD-10-CM | POA: Diagnosis not present

## 2019-07-05 DIAGNOSIS — D3502 Benign neoplasm of left adrenal gland: Secondary | ICD-10-CM | POA: Diagnosis not present

## 2019-07-05 DIAGNOSIS — C7A1 Malignant poorly differentiated neuroendocrine tumors: Secondary | ICD-10-CM | POA: Insufficient documentation

## 2019-07-05 DIAGNOSIS — I130 Hypertensive heart and chronic kidney disease with heart failure and stage 1 through stage 4 chronic kidney disease, or unspecified chronic kidney disease: Secondary | ICD-10-CM | POA: Diagnosis not present

## 2019-07-05 DIAGNOSIS — Z79899 Other long term (current) drug therapy: Secondary | ICD-10-CM | POA: Diagnosis not present

## 2019-07-05 DIAGNOSIS — J449 Chronic obstructive pulmonary disease, unspecified: Secondary | ICD-10-CM | POA: Diagnosis not present

## 2019-07-05 DIAGNOSIS — R5381 Other malaise: Secondary | ICD-10-CM | POA: Diagnosis not present

## 2019-07-05 MED ORDER — PROCHLORPERAZINE MALEATE 10 MG PO TABS: 10.0000 mg | ORAL_TABLET | Freq: Four times a day (QID) | ORAL | 1 refills | Status: DC | PRN

## 2019-07-05 MED ORDER — DEXAMETHASONE 4 MG PO TABS: 8.0000 mg | ORAL_TABLET | Freq: Every day | ORAL | 1 refills | Status: DC

## 2019-07-05 MED ORDER — LIDOCAINE-PRILOCAINE 2.5-2.5 % EX CREA: TOPICAL_CREAM | CUTANEOUS | 3 refills | Status: DC

## 2019-07-05 MED ORDER — ONDANSETRON HCL 8 MG PO TABS: 8.0000 mg | ORAL_TABLET | Freq: Two times a day (BID) | ORAL | 1 refills | Status: DC | PRN

## 2019-07-05 NOTE — Patient Instructions (Signed)
Etoposide, VP-16 injection What is this medicine? ETOPOSIDE, VP-16 (e toe POE side) is a chemotherapy drug. It is used to treat testicular cancer, lung cancer, and other cancers. This medicine may be used for other purposes; ask your health care provider or pharmacist if you have questions. COMMON BRAND NAME(S): Etopophos, Toposar, VePesid What should I tell my health care provider before I take this medicine? They need to know if you have any of these conditions:  infection  kidney disease  liver disease  low blood counts, like low white cell, platelet, or red cell counts  an unusual or allergic reaction to etoposide, other medicines, foods, dyes, or preservatives  pregnant or trying to get pregnant  breast-feeding How should I use this medicine? This medicine is for infusion into a vein. It is administered in a hospital or clinic by a specially trained health care professional. Talk to your pediatrician regarding the use of this medicine in children. Special care may be needed. Overdosage: If you think you have taken too much of this medicine contact a poison control center or emergency room at once. NOTE: This medicine is only for you. Do not share this medicine with others. What if I miss a dose? It is important not to miss your dose. Call your doctor or health care professional if you are unable to keep an appointment. What may interact with this medicine?  aspirin  certain medications for seizures like carbamazepine, phenobarbital, phenytoin, valproic acid  cyclosporine  levamisole  warfarin This list may not describe all possible interactions. Give your health care provider a list of all the medicines, herbs, non-prescription drugs, or dietary supplements you use. Also tell them if you smoke, drink alcohol, or use illegal drugs. Some items may interact with your medicine. What should I watch for while using this medicine? Visit your doctor for checks on your progress.  This drug may make you feel generally unwell. This is not uncommon, as chemotherapy can affect healthy cells as well as cancer cells. Report any side effects. Continue your course of treatment even though you feel ill unless your doctor tells you to stop. In some cases, you may be given additional medicines to help with side effects. Follow all directions for their use. Call your doctor or health care professional for advice if you get a fever, chills or sore throat, or other symptoms of a cold or flu. Do not treat yourself. This drug decreases your body's ability to fight infections. Try to avoid being around people who are sick. This medicine may increase your risk to bruise or bleed. Call your doctor or health care professional if you notice any unusual bleeding. Talk to your doctor about your risk of cancer. You may be more at risk for certain types of cancers if you take this medicine. Do not become pregnant while taking this medicine or for at least 6 months after stopping it. Women should inform their doctor if they wish to become pregnant or think they might be pregnant. Women of child-bearing potential will need to have a negative pregnancy test before starting this medicine. There is a potential for serious side effects to an unborn child. Talk to your health care professional or pharmacist for more information. Do not breast-feed an infant while taking this medicine. Men must use a latex condom during sexual contact with a woman while taking this medicine and for at least 4 months after stopping it. A latex condom is needed even if you have had a  vasectomy. Contact your doctor right away if your partner becomes pregnant. Do not donate sperm while taking this medicine and for at least 4 months after you stop taking this medicine. Men should inform their doctors if they wish to father a child. This medicine may lower sperm counts. What side effects may I notice from receiving this medicine? Side  effects that you should report to your doctor or health care professional as soon as possible:  allergic reactions like skin rash, itching or hives, swelling of the face, lips, or tongue  low blood counts - this medicine may decrease the number of white blood cells, red blood cells and platelets. You may be at increased risk for infections and bleeding.  signs of infection - fever or chills, cough, sore throat, pain or difficulty passing urine  signs of decreased platelets or bleeding - bruising, pinpoint red spots on the skin, black, tarry stools, blood in the urine  signs of decreased red blood cells - unusually weak or tired, fainting spells, lightheadedness  breathing problems  changes in vision  mouth or throat sores or ulcers  pain, redness, swelling or irritation at the injection site  pain, tingling, numbness in the hands or feet  redness, blistering, peeling or loosening of the skin, including inside the mouth  seizures  vomiting Side effects that usually do not require medical attention (report to your doctor or health care professional if they continue or are bothersome):  diarrhea  hair loss  loss of appetite  nausea  stomach pain This list may not describe all possible side effects. Call your doctor for medical advice about side effects. You may report side effects to FDA at 1-800-FDA-1088. Where should I keep my medicine? This drug is given in a hospital or clinic and will not be stored at home. NOTE: This sheet is a summary. It may not cover all possible information. If you have questions about this medicine, talk to your doctor, pharmacist, or health care provider.  2020 [employer] Standard (2015-09-22 11:53:23) Carboplatin injection What is this medicine? CARBOPLATIN (KAR boe pla tin) is a chemotherapy drug. It targets fast dividing cells, like cancer cells, and causes these cells to die. This medicine is used to treat ovarian cancer and many other  cancers. This medicine may be used for other purposes; ask your health care provider or pharmacist if you have questions. COMMON BRAND NAME(S): Paraplatin What should I tell my health care provider before I take this medicine? They need to know if you have any of these conditions:  blood disorders  hearing problems  kidney disease  recent or ongoing radiation therapy  an unusual or allergic reaction to carboplatin, cisplatin, other chemotherapy, other medicines, foods, dyes, or preservatives  pregnant or trying to get pregnant  breast-feeding How should I use this medicine? This drug is usually given as an infusion into a vein. It is administered in a hospital or clinic by a specially trained health care professional. Talk to your pediatrician regarding the use of this medicine in children. Special care may be needed. Overdosage: If you think you have taken too much of this medicine contact a poison control center or emergency room at once. NOTE: This medicine is only for you. Do not share this medicine with others. What if I miss a dose? It is important not to miss a dose. Call your doctor or health care professional if you are unable to keep an appointment. What may interact with this medicine?  medicines for  seizures  medicines to increase blood counts like filgrastim, pegfilgrastim, sargramostim  some antibiotics like amikacin, gentamicin, neomycin, streptomycin, tobramycin  vaccines Talk to your doctor or health care professional before taking any of these medicines:  acetaminophen  aspirin  ibuprofen  ketoprofen  naproxen This list may not describe all possible interactions. Give your health care provider a list of all the medicines, herbs, non-prescription drugs, or dietary supplements you use. Also tell them if you smoke, drink alcohol, or use illegal drugs. Some items may interact with your medicine. What should I watch for while using this medicine? Your  condition will be monitored carefully while you are receiving this medicine. You will need important blood work done while you are taking this medicine. This drug may make you feel generally unwell. This is not uncommon, as chemotherapy can affect healthy cells as well as cancer cells. Report any side effects. Continue your course of treatment even though you feel ill unless your doctor tells you to stop. In some cases, you may be given additional medicines to help with side effects. Follow all directions for their use. Call your doctor or health care professional for advice if you get a fever, chills or sore throat, or other symptoms of a cold or flu. Do not treat yourself. This drug decreases your body's ability to fight infections. Try to avoid being around people who are sick. This medicine may increase your risk to bruise or bleed. Call your doctor or health care professional if you notice any unusual bleeding. Be careful brushing and flossing your teeth or using a toothpick because you may get an infection or bleed more easily. If you have any dental work done, tell your dentist you are receiving this medicine. Avoid taking products that contain aspirin, acetaminophen, ibuprofen, naproxen, or ketoprofen unless instructed by your doctor. These medicines may hide a fever. Do not become pregnant while taking this medicine. Women should inform their doctor if they wish to become pregnant or think they might be pregnant. There is a potential for serious side effects to an unborn child. Talk to your health care professional or pharmacist for more information. Do not breast-feed an infant while taking this medicine. What side effects may I notice from receiving this medicine? Side effects that you should report to your doctor or health care professional as soon as possible:  allergic reactions like skin rash, itching or hives, swelling of the face, lips, or tongue  signs of infection - fever or chills,  cough, sore throat, pain or difficulty passing urine  signs of decreased platelets or bleeding - bruising, pinpoint red spots on the skin, black, tarry stools, nosebleeds  signs of decreased red blood cells - unusually weak or tired, fainting spells, lightheadedness  breathing problems  changes in hearing  changes in vision  chest pain  high blood pressure  low blood counts - This drug may decrease the number of white blood cells, red blood cells and platelets. You may be at increased risk for infections and bleeding.  nausea and vomiting  pain, swelling, redness or irritation at the injection site  pain, tingling, numbness in the hands or feet  problems with balance, talking, walking  trouble passing urine or change in the amount of urine Side effects that usually do not require medical attention (report to your doctor or health care professional if they continue or are bothersome):  hair loss  loss of appetite  metallic taste in the mouth or changes in taste This  list may not describe all possible side effects. Call your doctor for medical advice about side effects. You may report side effects to FDA at 1-800-FDA-1088. Where should I keep my medicine? This drug is given in a hospital or clinic and will not be stored at home. NOTE: This sheet is a summary. It may not cover all possible information. If you have questions about this medicine, talk to your doctor, pharmacist, or health care provider.  2020 [employer]/Gold Standard (2008-01-05 14:38:05)

## 2019-07-05 NOTE — Progress Notes (Signed)
START OFF PATH[facility] - Other   [id number]:Carboplatin AUC=5 D1 + Etoposide 100 mg/m2 D1-3 q21 Days:   A cycle is every 21 days:     Etoposide      Carboplatin   **Always confirm dose/schedule in your pharmacy ordering system**  Patient Characteristics: Intent of Therapy: Curative Intent, Discussed with Patient

## 2019-07-05 NOTE — Telephone Encounter (Signed)
Called and spoke to pt and went over that we are cancelling the ct scan of chest on 9/30. He will still keep the MRI on that day but now does not have ct scan. Patient told me he can't read and to call his wife. I called her on cell phone and told her that we are cancelling ct scan. His kidney function was up some and she does not want to make kidney's worse so we are cancelling it. She states that she understands. She wants to know if she will have to remember to tell anyone and I told her that I have already cancelled it so no one will ask her because it will not be on chart any longer. She is ok with that.

## 2019-07-05 NOTE — Progress Notes (Signed)
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  As a part of his staging work-up he underwent CT chest as well as CT neck.  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  CT neck was unremarkable however CT chest showed a right upper lobe nodule 2.1 x 1.7 cm in with minimal spiculation as well as a 0.5 cm right lower lobe nodule.  No mediastinal or hilar adenopathy was noted.  Patient was referred to [facility] for further work-up given his preference.  He underwent a PET CT scan on 06/10/2019 which showed a hypermetabolic right upper lobe lung nodule measuring 2 cm.  Equivocal right infrahilar hypermetabolism with an SUV of 3.1.  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  Large right kidney mass measuring 9.7 x 9 cm.  Patient underwent CT-guided lung biopsy which was consistent with large cell neuroendocrine carcinoma.  Patient is a chronic smoker and continues to smoke 2 packs/day and has been doing so for the last 50 years.  He is independent of his ADLs but spends most of his time sitting in a chair.  He does not use oxygen routinely but mainly uses it at night.  ECOG PS- 2  Pain scale- 0   Review of systems- Review of Systems  Constitutional: Positive for  malaise/fatigue. Negative for chills, fever and weight loss.  HENT: Negative for congestion, ear discharge and nosebleeds.   Eyes: Negative for blurred vision.  Respiratory: Positive for shortness of breath. Negative for cough, hemoptysis, sputum production and wheezing.   Cardiovascular: Negative for chest pain, palpitations, orthopnea and claudication.  Gastrointestinal: Negative for abdominal pain, blood in stool, constipation, diarrhea, heartburn, melena, nausea and vomiting.  Genitourinary: Negative for dysuria, flank pain, frequency, hematuria and urgency.  Musculoskeletal: Negative for back pain, joint pain and myalgias.  Skin: Negative for rash.  Neurological: Negative for dizziness, tingling, focal weakness, seizures, weakness and headaches.  Endo/Heme/Allergies: Does not bruise/bleed easily.  Psychiatric/Behavioral: Negative for depression and suicidal ideas. The patient does not have insomnia.     Allergies  Allergen Reactions   Codeine Nausea And Vomiting and Other (See Comments)    Pt states when he takes codeine, he throws up blood   Niacin And Related Other (See Comments)    "made his body feel hot & he had a hard time breathing"    Patient Active Problem List   Diagnosis Date Noted   Skin cancer of lip 11/01/2018   Melanoma in situ of ear, right ([place]) 10/05/2018   Obesity (BMI 30.0-34.9) 09/05/2014   Smoking 10/21/2011   Unstable angina ([place]) 10/18/2011   S/P CABG x 4, 2006.PCI 11/10, cath this adm- Med Rx 10/18/2011   HTN (hypertension) 10/18/2011   Dyslipidemia 10/18/2011   COPD (chronic obstructive  pulmonary disease) ([place]) 10/18/2011   Cardiomyopathy, ischemic, EF 40-45% 2D 11/10 10/18/2011     Past Medical History:  Diagnosis Date   Anxiety    COPD (chronic obstructive pulmonary disease) ([place])    Coronary artery disease 2006   Acute coronary syndrome in March 2006.    Diabetes mellitus without complication ([place])    Dyslipidemia     Hypertension 10/09/2010   EF 40-45%   Melanoma in situ of ear, right ([place]) 09/2018   Sleep apnea    uses cpap machine     Past Surgical History:  Procedure Laterality Date   Arterial evalation      no evidence of ICA stenosis   CARDIAC CATHETERIZATION     CARDIAC SURGERY     CORONARY ARTERY BYPASS GRAFT  2006   Post CABG surgery with a LIMA to the LAD, a vein to the diagonal ,,a vein to the the obtuse marginal and vein to the PDA.   CORONARY STENT PLACEMENT  2010   EAR CYST EXCISION Right 10/05/2018   Procedure: Right Partial pinnectomy;  Surgeon: Izora Gala, MD;  Location: [place];  Service: ENT;  Laterality: Right;   GRAFT(S) ANGIOGRAM  10/21/2011   Procedure: GRAFT(S) Cyril Loosen;  Surgeon: Leonie Man, MD;  Location: [facility] C[facility] LAB;  Service: Cardiovascular;;   LEFT HEART CATHETERIZATION WITH CORONARY ANGIOGRAM N/A 10/21/2011   Procedure: LEFT HEART CATHETERIZATION WITH CORONARY ANGIOGRAM;  Surgeon: Leonie Man, MD;  Location: [facility] CATH LAB;  Service: Cardiovascular;  Laterality: N/A;   LYMPH NODE BIOPSY Right 10/05/2018   Procedure: sentinel NODE BIOPSY with right partial modified neck dissection;  Surgeon: Izora Gala, MD;  Location: [facility] OR;  Service: ENT;  Laterality: Right;    Social History   Socioeconomic History   Marital status: Married    Spouse name: Not on file   Number of children: Not on file   Years of education: Not on file   Highest education level: Not on file  Occupational History   Not on file  Social Needs   Financial resource strain: Not on file   Food insecurity    Worry: Not on file    Inability: Not on file   Transportation needs    Medical: Not on file    Non-medical: Not on file  Tobacco Use   Smoking status: Current Every Day Smoker    Packs/day: 2.00    Years: 50.00    Pack years: 100.00    Types: Cigarettes   Smokeless tobacco: Former Systems developer    Types: Chew    Quit date: 01/23/2013  Substance and Sexual Activity    Alcohol use: No   Drug use: Not Currently   Sexual activity: Not Currently  Lifestyle   Physical activity    Days per week: Not on file    Minutes per session: Not on file   Stress: Not on file  Relationships   Social connections    Talks on phone: Not on file    Gets together: Not on file    Attends religious service: Not on file    Active member of club or organization: Not on file    Attends meetings of clubs or organizations: Not on file    Relationship status: Not on file   Intimate partner violence    Fear of current or ex partner: Not on file    Emotionally abused: Not on file    Physically abused: Not on file    Forced sexual  activity: Not on file  Other Topics Concern   Not on file  Social History Narrative   Not on file     Family History  Problem Relation Age of Onset   Hypertension Mother    Heart attack Mother    Heart attack Father 52     Current Outpatient Medications:    amLODipine (NORVASC) 10 MG tablet, Take 10 mg by mouth daily., Disp: , Rfl:    aspirin EC 81 MG tablet, Take 81 mg by mouth daily., Disp: , Rfl:    atorvastatin (LIPITOR) 40 MG tablet, Take 1 tablet (40 mg total) by mouth at bedtime. Keep October Appointment, Disp: 90 tablet, Rfl: 0   busPIRone (BUSPAR) 7.5 MG tablet, Take 1 tablet by mouth 2 (two) times daily., Disp: , Rfl:    ezetimibe (ZETIA) 10 MG tablet, TAKE 1 TABLET BY MOUTH EVERY DAY (Patient taking differently: Take 10 mg by mouth daily. ), Disp: 90 tablet, Rfl: 3   famotidine (PEPCID) 20 MG tablet, TAKE 1 TABLET (20 MG TOTAL) BY MOUTH DAILY. NEEDS APPOINTMENT FOR FUTURE REFILLS, Disp: 30 tablet, Rfl: 3   INCRUSE ELLIPTA 62.5 MCG/INH AEPB, Inhale 1 puff into the lungs daily. , Disp: , Rfl: 2   Ipratropium-Albuterol (COMBIVENT RESPIMAT) 20-100 MCG/ACT AERS respimat, Inhale 1 puff into the lungs every 6 (six) hours., Disp: , Rfl:    nitroGLYCERIN (NITROSTAT) 0.4 MG SL tablet, Place 0.4 mg under the tongue every 5  (five) minutes as needed for chest pain., Disp: , Rfl:    ranolazine (RANEXA) 1000 MG SR tablet, TAKE 1 TABLET BY MOUTH TWICE A DAY, Disp: 60 tablet, Rfl: 11   ALPRAZolam (XANAX) 0.25 MG tablet, Take 0.25 mg by mouth 3 (three) times daily. , Disp: , Rfl:    isosorbide mononitrate (IMDUR) 60 MG 24 hr tablet, TAKE 1 TABLET BY MOUTH EVERY DAY, Disp: 90 tablet, Rfl: 1   losartan-hydrochlorothiazide (HYZAAR) 100-12.5 MG tablet, Take 1 tablet by mouth daily., Disp: 30 tablet, Rfl: 10   metFORMIN (GLUCOPHAGE) 500 MG tablet, TAKE 1 TABLET (500 MG TOTAL) BY MOUTH 2 (TWO) TIMES DAILY WITH A MEAL., Disp: 180 tablet, Rfl: 0   Physical exam:  Vitals:   07/02/19 1003  BP: 136/74  Pulse: 81  Resp: 16  Temp: 97.9 F (36.6 C)  Weight: 181 lb 4.8 oz (82.2 kg)  Height: 5\' 8"  (1.727 m)   Physical Exam Constitutional:      General: He is not in acute distress.    Comments: He is fatigued and is sitting in a wheelchair  HENT:     Head: Normocephalic and atraumatic.     Ears:     Comments: Right ear pinna has been partially resected Eyes:     Pupils: Pupils are equal, round, and reactive to light.  Neck:     Musculoskeletal: Normal range of motion.  Cardiovascular:     Rate and Rhythm: Normal rate and regular rhythm.     Heart sounds: Normal heart sounds.  Pulmonary:     Effort: Pulmonary effort is normal.     Breath sounds: Normal breath sounds.  Abdominal:     General: Bowel sounds are normal.     Palpations: Abdomen is soft.  Skin:    General: Skin is warm and dry.  Neurological:     Mental Status: He is alert and oriented to person, place, and time.        CMP Latest Ref Rng & Units 07/02/2019  Glucose  70 - 99 mg/dL 118(H)  BUN 8 - 23 mg/dL 11  Creatinine 0.61 - 1.24 mg/dL 1.27(H)  Sodium 135 - 145 mmol/L 139  Potassium 3.5 - 5.1 mmol/L 4.1  Chloride 98 - 111 mmol/L 103  CO2 22 - 32 mmol/L 28  Calcium 8.9 - 10.3 mg/dL 9.3  Total Protein 6.5 - 8.1 g/dL 8.0  Total Bilirubin  0.3 - 1.2 mg/dL 0.9  Alkaline Phos 38 - 126 U/L 100  AST 15 - 41 U/L 14(L)  ALT 0 - 44 U/L 14   CBC Latest Ref Rng & Units 07/02/2019  WBC 4.0 - 10.5 K/uL 12.9(H)  Hemoglobin 13.0 - 17.0 g/dL 12.7(L)  Hematocrit 39.0 - 52.0 % 39.5  Platelets 150 - 400 K/uL 331    No images are attached to the encounter.  Nm Pet Image Initial (pi) Skull Base To Thigh  Result Date: 06/10/2019 CLINICAL DATA:  Initial treatment strategy for right-sided pulmonary nodule. No prior chemotherapy or radiation therapy. History of head neck cancer. EXAM: NUCLEAR MEDICINE PET SKULL BASE TO THIGH TECHNIQUE: 10.0 mCi F-18 FDG was injected intravenously. Full-ring PET imaging was performed from the skull base to thigh after the radiotracer. CT data was obtained and used for attenuation correction and anatomic localization. Fasting blood glucose: 122 mg/dl COMPARISON:  Chest radiograph 12/27/2016 FINDINGS: Mediastinal blood pool activity: SUV max 2.6 Liver activity: SUV max NA NECK: No areas of abnormal hypermetabolism. Incidental CT findings: Bilateral carotid atherosclerosis. Cerebral atrophy. CHEST: Right upper lobe pulmonary nodule is relatively well-circumscribed and measures 2.0 cm. Corresponds to hypermetabolism at a S.U.V. max of 10.7 on image 96/609. No mediastinal hypermetabolism. There is equivocal right infrahilar hypermetabolism, including at a S.U.V. max of 3.1 on image 107/609. Incidental CT findings: Median sternotomy for CABG. Aortic atherosclerosis. Moderate centrilobular emphysema. Nonspecific tree-in-bud nodularity is basilar predominant and mild bilaterally. A 3 mm right lower lobe pulmonary nodule on 114/609. 6 mm right lower lobe pulmonary nodule on image 111. Left lower lobe scarring ABDOMEN/PELVIS: Left adrenal mass measures 2.8 x 2.9 cm and is low-density, consistent with an adenoma. This has heterogeneous hypermetabolism within, including at a S.U.V. max of 3.1. The inter and lower pole region of the right  kidney is replaced by a heterogeneous solid mass, including at 9.7 x 9.0 cm on 189/609. This corresponds to hypermetabolism at a S.U.V. max of 7.6. Incidental CT findings: Abdominal aortic atherosclerosis. Mild right adrenal nodularity, also likely due to an adenoma. Upper pole right renal 3.6 cm fluid density lesion is likely a cyst. 7 mm pre caval node is not pathologic by CT size criteria. Saccular outpouching of the suprarenal aorta of 1.0 cm, at the 4 o'clock position on 10767/609. Mild prostatomegaly. SKELETON: No abnormal marrow activity. Incidental CT findings: none IMPRESSION: 1. Hypermetabolic right upper lobe pulmonary nodule, favored to represent primary bronchogenic carcinoma. Isolated metastatic disease from 1 of the patient's primaries felt less likely. 2. Equivocal right infrahilar nodal hypermetabolism. Consider correlation with contrast-enhanced chest CT to evaluate for adenopathy in this area. 3. Right renal mass, consistent with renal cell carcinoma. 4. Pulmonary nodules, for which metastasis cannot be excluded. 5. Dominant left adrenal adenoma. Probable smaller right adrenal adenoma. 6. Aortic atherosclerosis (ICD10-I70.0) and emphysema (ICD10-J43.9). 7. Tree-in-bud nodularity at the lung bases is suspicious for atypical infection or aspiration. These results will be called to the ordering clinician or representative by the Radiologist Assistant, and communication documented in the PACS or zVision Dashboard. Electronically Signed   By: Marylyn Ishihara  Jobe Igo M.D.   On: 06/10/2019 15:57   Ct Biopsy  Result Date: 06/23/2019 INDICATION: 68 year old male with a history of FDG avid right lung nodule EXAM: CT BIOPSY MEDICATIONS: None. ANESTHESIA/SEDATION: Moderate (conscious) sedation was employed during this procedure. A total of Versed 2.0 mg and Fentanyl 100 mcg was administered intravenously. Moderate Sedation Time: 15 minutes. The patient's level of consciousness and vital signs were monitored  continuously by radiology nursing throughout the procedure under my direct supervision. FLUOROSCOPY TIME:  CT COMPLICATIONS: None PROCEDURE: The procedure, risks, benefits, and alternatives were explained to the patient and the patient's family. Specific risks that were addressed included bleeding, infection, pneumothorax, need for further procedure including chest tube placement, chance of delayed pneumothorax or hemorrhage, hemoptysis, nondiagnostic sample, cardiopulmonary collapse, death. Questions regarding the procedure were encouraged and answered. The patient understands and consents to the procedure. Patient was positioned in the supine position on the CT gantry table and a scout CT of the chest was performed for planning purposes. Once angle of approach was determined, the skin and subcutaneous tissues this scan was prepped and draped in the usual sterile fashion, and a sterile drape was applied covering the operative field. A sterile gown and sterile gloves were used for the procedure. Local anesthesia was provided with 1% Lidocaine. The skin and subcutaneous tissues were infiltrated 1% lidocaine for local anesthesia, and a small stab incision was made with an 11 blade scalpel. Using CT guidance, a 17 gauge trocar needle was advanced into the right upper lobetarget. After confirmation of the tip, separate 18 gauge core biopsies were performed. These were placed into solution for transportation to the lab. Biosentry Device was deployed. A final CT image was performed. Patient tolerated the procedure well and remained hemodynamically stable throughout. No complications were encountered and no significant blood loss was encounter IMPRESSION: Status post CT-guided biopsy of right upper lobe nodule. Tissue specimen sent to pathology for complete histopathologic analysis. Signed, Dulcy Fanny. Dellia Nims, RPVI [facility] Radiology Electronically Signed   By: Corrie Mckusick D.O.   On: 06/23/2019 12:39   Dg Chest Port 1 View  Result Date: 06/23/2019 CLINICAL DATA:  68 year old male with a history of right lung nodule biopsy EXAM: PORTABLE CHEST 1 VIEW COMPARISON:  PET-CT 06/10/2019, chest x-ray 06/23/2019 at 3:08 p.m. FINDINGS: Cardiomediastinal silhouette unchanged in size and contour. Surgical changes of median sternotomy and CABG. Small right pneumothorax, unchanged from the comparison. Gas along the right lateral chest wall again noted as well as the nodule of the right lung. Chronic lung changes. IMPRESSION: Unchanged size of small right-sided pneumothorax. Electronically Signed   By: Corrie Mckusick D.O.   On: 06/23/2019 15:03   Dg Chest Port 1 View  Result Date: 06/23/2019 CLINICAL DATA:  69 year old male with a history of right lung nodule biopsy EXAM: PORTABLE CHEST 1 VIEW COMPARISON:  PET-CT 06/10/2019 FINDINGS: Cardiomediastinal silhouette unchanged in size and contour. Surgical changes of median sternotomy and CABG. Calcifications of the aortic arch. Small right pneumothorax. Nodule in the right lateral lung corresponding to the right lung nodule. Gas along the right chest wall soft tissues. IMPRESSION: Small right pneumothorax, with gas along the right lateral chest wall corresponding to recent biopsy. Electronically Signed   By: Corrie Mckusick D.O.   On: 06/23/2019 15:00   Ct Outside Films Chest  Result Date: 06/30/2019 This examination belongs to an outside facility and is stored here for comparison purposes only.  Contact the originating outside  institution for any associated report or interpretation.  Ct Outside Films Head/face  Result Date: 06/30/2019 This examination belongs to an outside facility and is stored here for comparison purposes only.  Contact the originating outside institution for any associated report or interpretation.   Assessment and plan- Patient is a 68 y.o. male with newly diagnosed neuroendocrine carcinoma of the right lung  stage I T1 cN0 M0 here to discuss the following issues:  1.  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  We discussed this case at tumor board yesterday and I discussed the findings with patient and his family.  Given that his tumor is positive for TTF-1 as well as CD56 and negative for P 40 this was consistent with large cell neuroendocrine tumor which are typically more aggressive than traditional non-small cell lung cancers.  I would like therefore like to treated as a limited stage small cell lung cancer with concurrent chemoradiation despite being stage I.  Overall patient is not a good surgical candidate given his neuroendocrine histology he is likely to require adjuvant chemotherapy anyways and may potentially require radiation treatment if there is upstaging at the time of surgery.  Therefore chemoradiation is being preferred over consideration for surgery.   There was questionable low degree of uptake in the right hilum and it is unclear if that truly represents hilar lymph node involvement.  Again this was discussed at tumor board yesterday and Dr. Baruch Gouty plans to radiate right hilum as well.  I will therefore hold off on getting a dedicated CT chest with contrast and EBUS guided biopsy.  He does have some baseline chronic kidney disease.  His creatinine in the recent past has been fluctuating between 1.2-1.3.  I will therefore hold off on giving him cisplatin and proceed with carboplatin AUC 5 on day 1 along with etoposide 100 mg per metered square on day 1 2 and 3 IV every 3 weeks for 4 cycles concurrent with radiation.  Discussed risks and benefits of chemotherapy including all but not limited to nausea, vomiting, low blood counts, risk of infections and hospitalization.  Risk of peripheral neuropathy and kidney injury associated with carboplatin.  Treatment will be given with a curative intent.  Patient understands and agrees to  proceed as planned.  I will also obtain an MRI brain with and without contrast to complete his staging work-up.  Will refer to vascular surgery for port placement and he will also need a chemotherapy teach class  2.  Also discussed the finding of a large right renal mass which needs to be better evaluated with MRI abdomen with and without contrast.  I have discussed this case with Dr. Hollice Espy and I will be referring him to urology as well.  If there is no overt involvement of renal vein, we will likely  proceed with his lung cancer treatment first and wait for a nephrectomy down the line.  3.  Left adrenal mass had a low SUV uptake and was consistent with a benign adenoma.  Continue to monitor   Total face to face encounter time for this patient visit was 40 min. >50% of the time was  spent in counseling and coordination of care.   Cancer Staging Malignant neoplasm of upper lobe of right lung [facility]) Staging form: Lung, AJCC 8th Edition - Clinical stage from 07/02/2019: Stage IA3 (cT1c, cN0, cM0) - Signed by Sindy Guadeloupe, MD on 07/05/2019  Thank you for this kind referral and the opportunity to participate in the care of this patient   Visit Diagnosis 1. Renal mass   2. Large cell neuroendocrine carcinoma ([place])   3. Goals of care, counseling/discussion   4. Malignant neoplasm of upper lobe of right lung [facility])     Dr. Randa Evens, MD, MPH [facility] [phone] 07/05/2019 11:36 AM

## 2019-07-05 NOTE — Progress Notes (Signed)
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 male who presents to chemo care clinic today for initial meeting in preparation for starting chemotherapy. I introduced the chemo care clinic and we discussed that the role of the clinic is to assist those who are at an increased risk of emergency room visits and/or complications during the course of chemotherapy treatment. We discussed that the increased risk takes into account factors such as age, performance status, and co-morbidities. We also discussed that for some, this might include barriers to care such as not having a primary care provider, lack of insurance/transportation, or not being able to afford medications. We discussed that the goal of the program is to help prevent unplanned ER visits and help reduce complications during chemotherapy. We do this by discussing specific risk factors to each individual and identifying ways that we can help improve these risk factors and reduce barriers to care.   Hematology/Oncology History:  Oncology History  Malignant neoplasm of upper lobe of right lung ([place])  07/02/2019 Cancer Staging   Staging form: Lung, AJCC 8th Edition - Clinical stage from 07/02/2019: Stage IA3 (cT1c, cN0, cM0) - Signed by Sindy Guadeloupe, MD on 07/05/2019   07/05/2019 Initial Diagnosis   Malignant neoplasm of upper lobe of right lung ([place])   07/12/2019 -  Chemotherapy   The patient had palonosetron (ALOXI) injection 0.25 mg, 0.25 mg, Intravenous,  Once, 0 of 4  cycles pegfilgrastim-jmdb (FULPHILA) injection 6 mg, 6 mg, Subcutaneous,  Once, 0 of 4 cycles CARBOplatin (PARAPLATIN) 440 mg in sodium chloride 0.9 % 250 mL chemo infusion, 440 mg (original dose ), Intravenous,  Once, 0 of 4 cycles Dose modification:   (Cycle 1) etoposide (VEPESID) 200 mg in sodium chloride 0.9 % 500 mL chemo infusion, 100 mg/m2, Intravenous,  Once, 0 of 4 cycles fosaprepitant (EMEND) 150 mg, dexamethasone (DECADRON) 12 mg in sodium chloride 0.9 % 145 mL IVPB, , Intravenous,  Once, 0 of 4 cycles  for chemotherapy treatment.       Allergies  Allergen Reactions  . Codeine Nausea And Vomiting and Other (See Comments)    Pt states when he takes codeine, he throws up blood  . Niacin And Related Other (See Comments)    "made his body feel hot & he had a hard time breathing"     Past Medical History:  Diagnosis Date  . Anxiety   . COPD (chronic obstructive pulmonary disease) ([place])   . Coronary artery disease 2006   Acute coronary syndrome in March 2006.   . Diabetes mellitus without complication ([place])   . Dyslipidemia   . Hypertension 10/09/2010   EF 40-45%  . 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 Sleep apnea    uses cpap machine     Past Surgical History:  Procedure Laterality Date  . Arterial evalation      no evidence of ICA stenosis  . CARDIAC CATHETERIZATION    . CARDIAC SURGERY    . CORONARY ARTERY BYPASS GRAFT  2006  Post CABG surgery with a LIMA to the LAD, a vein to the diagonal ,,a vein to the the obtuse marginal and vein to the PDA.  [name] CORONARY STENT PLACEMENT  2010  . EAR CYST EXCISION Right 10/05/2018   Procedure: Right Partial pinnectomy;  Surgeon: Izora Gala, MD;  Location: [place];  Service: ENT;  Laterality: Right;  . GRAFT(S) ANGIOGRAM  10/21/2011   Procedure: GRAFT(S) Cyril Loosen;  Surgeon: Leonie Man, MD;  Location: [facility] CATH LAB;  Service: Cardiovascular;;  . LEFT HEART CATHETERIZATION WITH CORONARY ANGIOGRAM N/A 10/21/2011    Procedure: LEFT HEART CATHETERIZATION WITH CORONARY ANGIOGRAM;  Surgeon: Leonie Man, MD;  Location: [facility] CATH LAB;  Service: Cardiovascular;  Laterality: N/A;  . LYMPH NODE BIOPSY Right 10/05/2018   Procedure: sentinel NODE BIOPSY with right partial modified neck dissection;  Surgeon: Izora Gala, MD;  Location: [place];  Service: ENT;  Laterality: Right;    Social History   Socioeconomic History  . Marital status: Married    Spouse name: Not on file  . Number of children: Not on file  . Years of education: Not on file  . Highest education level: Not on file  Occupational History  . Not on file  Social Needs  . Financial resource strain: Not on file  . Food insecurity    Worry: Not on file    Inability: Not on file  . Transportation needs    Medical: Not on file    Non-medical: Not on file  Tobacco Use  . Smoking status: Current Every Day Smoker    Packs/day: 2.00    Years: 50.00    Pack years: 100.00    Types: Cigarettes  . Smokeless tobacco: Former Systems developer    Types: [place] date: 01/23/2013  Substance and Sexual Activity  . Alcohol use: No  . Drug use: Not Currently  . Sexual activity: Not Currently  Lifestyle  . Physical activity    Days per week: Not on file    Minutes per session: Not on file  . Stress: Not on file  Relationships  . Social Herbalist on phone: Not on file    Gets together: Not on file    Attends religious service: Not on file    Active member of club or organization: Not on file    Attends meetings of clubs or organizations: Not on file    Relationship status: Not on file  . Intimate partner violence    Fear of current or ex partner: Not on file    Emotionally abused: Not on file    Physically abused: Not on file    Forced sexual activity: Not on file  Other Topics Concern  . Not on file  Social History Narrative  . Not on file    Family History  Problem Relation Age of Onset  . Hypertension Mother   . Heart attack  Mother   . Heart attack Father 39    Current Outpatient Medications  Medication Sig Dispense Refill  . ALPRAZolam (XANAX) 0.25 MG tablet Take 0.25 mg by mouth 3 (three) times daily.     [name] amLODipine (NORVASC) 10 MG tablet Take 10 mg by mouth daily.    [name] aspirin EC 81 MG tablet Take 81 mg by mouth daily.    [name] atorvastatin (LIPITOR) 40 MG tablet Take 1 tablet (40 mg total) by mouth at bedtime. Keep October Appointment 90 tablet 0  . busPIRone (BUSPAR) 7.5  MG tablet Take 1 tablet by mouth 2 (two) times daily.    [name] dexamethasone (DECADRON) 4 MG tablet Take 2 tablets (8 mg total) by mouth daily. Start the day after chemotherapy for 1 day. 30 tablet 1  . ezetimibe (ZETIA) 10 MG tablet TAKE 1 TABLET BY MOUTH EVERY DAY (Patient taking differently: Take 10 mg by mouth daily. ) 90 tablet 3  . famotidine (PEPCID) 20 MG tablet TAKE 1 TABLET (20 MG TOTAL) BY MOUTH DAILY. NEEDS APPOINTMENT FOR FUTURE REFILLS 30 tablet 3  . INCRUSE ELLIPTA 62.5 MCG/INH AEPB Inhale 1 puff into the lungs daily.   2  . Ipratropium-Albuterol (COMBIVENT RESPIMAT) 20-100 MCG/ACT AERS respimat Inhale 1 puff into the lungs every 6 (six) hours.    . isosorbide mononitrate (IMDUR) 60 MG 24 hr tablet TAKE 1 TABLET BY MOUTH EVERY DAY 90 tablet 1  . lidocaine-prilocaine (EMLA) cream Apply to affected area once 30 g 3  . losartan-hydrochlorothiazide (HYZAAR) 100-12.5 MG tablet Take 1 tablet by mouth daily. 30 tablet 10  . metFORMIN (GLUCOPHAGE) 500 MG tablet TAKE 1 TABLET (500 MG TOTAL) BY MOUTH 2 (TWO) TIMES DAILY WITH A MEAL. 180 tablet 0  . nitroGLYCERIN (NITROSTAT) 0.4 MG SL tablet Place 0.4 mg under the tongue every 5 (five) minutes as needed for chest pain.    [name] ondansetron (ZOFRAN) 8 MG tablet Take 1 tablet (8 mg total) by mouth 2 (two) times daily as needed for refractory nausea / vomiting. Start on day 3 after carboplatin chemo. 30 tablet 1  . prochlorperazine (COMPAZINE) 10 MG tablet Take 1 tablet (10 mg total) by mouth every 6  (six) hours as needed (Nausea or vomiting). 30 tablet 1  . ranolazine (RANEXA) 1000 MG SR tablet TAKE 1 TABLET BY MOUTH TWICE A DAY 60 tablet 11   No current facility-administered medications for this visit.      PERFORMANCE STATUS (ECOG) : 0 - Asymptomatic  Review of Systems As noted above. Otherwise, a complete review of systems is negative.  Physical Exam General: NAD, frail appearing, thin Pulmonary: unlabored Extremities: no edema, no joint deformities Skin: no rashes Neurological: Weakness but otherwise nonfocal   Assessment and Plan:    1. 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 Patient is followed by Dr. Janese Banks with follow up appointment scheduled on 07/20/19.   2. High Risk for ER/Hospitalization during Chemotherapy: We discussed the role of the chemo care clinic and identified patient specific risk factors. We also discussed the role of the Symptom Management and Palliative Care Clinics at [facility] and methods of contacting clinic/provider. He denies needing specific assistance at this time.  Current PCP: [name], MD  Hospital Admissions: 2   ED Visits: 0  Has Medicaid: Yes  Has Medicare: Yes  In relationship: Yes  Has Anemia: No  Has asthma: No  Has atrial fibrillation: No  Has CVD: Yes  Has chronic kidney disease: No  Has Chronic Obstructive Pulmonary Disease: Yes  Has Congestive Heart Failure: Yes  Has Connective Tissue Disorder: No  Has Depression: No  Has Diabetes: Yes  Has liver disease: No  Has Peripheral Vascular Disease: No    3. Social Determinants of Health:   Housing -patient lives at home in a house with his wife.  Food -he denies any concerns  Transportation -patient still drives and is able to transport himself to appointments  Utilities -denies any concerns  Safety -denies any  concerns  Financial Strain -denies any concerns  Employment -patient is  retired.  He worked as a Theme park manager.  Family/Community Support -patient reports that he has good family support  Physical Activity -he reports being functionally independent   4. Co-morbidities Complicating Care: Patient has COPD but it sounds like it is reasonably well controlled.  He is on Advair, Combivent, and Trelegy.  He is followed by pulmonary.  Patient does not have any recent hospitalizations for COPD exacerbation.  He does continue to smoke although having been counseled regarding options and benefit of quitting.  Patient reports that he has all of his medications at home.  He sees Dr. Elijio Miles regularly as a PCP  We explored resources available here in the cancer center.  Patient expressed understanding and was in agreement with this plan. He also understands that He can call clinic at any time with any questions, concerns, or complaints.   A total of (15) minutes of face-to-face time was spent with this patient with greater than 50% of that time in counseling and care-coordination.   Signed by: Altha Harm, PhD, DNP, NP-C, [facility] [phone] (Work Cell)

## 2019-07-06 ENCOUNTER — Other Ambulatory Visit: Payer: Self-pay | Admitting: Oncology

## 2019-07-06 ENCOUNTER — Other Ambulatory Visit: Payer: Self-pay

## 2019-07-06 ENCOUNTER — Telehealth (INDEPENDENT_AMBULATORY_CARE_PROVIDER_SITE_OTHER): Payer: Self-pay

## 2019-07-06 ENCOUNTER — Ambulatory Visit
Admission: RE | Admit: 2019-07-06 | Discharge: 2019-07-06 | Disposition: A | Discharge status: Home / Self Care | Payer: Medicare Other | Source: Ambulatory Visit | Attending: Radiation Oncology | Admitting: Radiation Oncology

## 2019-07-06 DIAGNOSIS — Z510 Encounter for antineoplastic radiation therapy: Secondary | ICD-10-CM | POA: Diagnosis not present

## 2019-07-06 DIAGNOSIS — C7A1 Malignant poorly differentiated neuroendocrine tumors: Secondary | ICD-10-CM | POA: Insufficient documentation

## 2019-07-06 NOTE — Telephone Encounter (Signed)
Luella Cook, RN  Devona Konig, CMA        Mickel Baas , we have a new cancer pt and he is needing port to start chemo. His chemo date is 10/6 and he has a lot of stuff to get done in between. He is not on any blood thinners beside asa only. Can you give me a date for port. I can call family and let them know. The patient is not the best one to call.   Thank you  sherry      Hi Judeen Hammans,  Sorry I was on Pal Friday -Monday. I have the patient scheduled with Dew for Monday 07/12/2019 with a 6:45 am arrival time to the [facility]. He will do his Covid testing on Thursday 07/08/2019 between 12:30-2:30 pm at [facility] ( drive up ) stop Metformin 24 hrs before and 48 hrs after procedure can take all other med's with small sips of water and NPO after Midnight Sunday 07/11/2019.   Thank you.

## 2019-07-07 ENCOUNTER — Telehealth: Payer: Self-pay | Admitting: *Deleted

## 2019-07-07 ENCOUNTER — Encounter: Payer: Self-pay | Admitting: *Deleted

## 2019-07-07 DIAGNOSIS — C7A1 Malignant poorly differentiated neuroendocrine tumors: Secondary | ICD-10-CM | POA: Diagnosis not present

## 2019-07-07 NOTE — Progress Notes (Signed)
  Oncology Nurse Navigator Documentation  Navigator Location: [facility] (07/07/19 1400)   )Navigator Encounter Type: Telephone (07/07/19 1400) Telephone: Outgoing Call;Education (07/07/19 1400)                     Treatment Phase: Pre-Tx/Tx Discussion (07/07/19 1400)     Interventions: Education (07/07/19 1400)     Education Method: Verbal;Written (07/07/19 1400)       phone call made to pt's daughter, Daine Floras, to follow up from initial consult last week. All questions answered during phone call. Reviewed upcoming appts. Reassurance provided. Educational materials regarding diagnosis and supportive services available mailed to pt. Instructed to call back with any further questions or needs. Susie verbalized understanding. Nothing further needed at this time.         Time Spent with Patient: 30 (07/07/19 1400)

## 2019-07-07 NOTE — Telephone Encounter (Signed)
Called daughter which is what pt wanted [place] to do. Told susie about the port placement date 9/28. [facility] procedure.  He will not be able to drink or eat after midnight Sunday.  If he has meds to take that am just take with sip of water. If he is taking metformin then stop it one day before the procdure and hold it for 2 days after the procedure.  I told daughter that when I called the pharmacy- the pt. Had not got metformin RX since 2019 so I assume he is not taking it but just to check with her Dad to see.  Also he will need to do the covid test and [facility] anytime between 12:30 and 2:30. She states she will go over it with them and that they already have been for covid testing before and they know where to go. She will call me if she has any questions or issues

## 2019-07-08 ENCOUNTER — Other Ambulatory Visit
Admission: RE | Admit: 2019-07-08 | Discharge: 2019-07-08 | Disposition: A | Discharge status: Home / Self Care | Payer: Medicare Other | Source: Ambulatory Visit | Attending: Vascular Surgery | Admitting: Vascular Surgery

## 2019-07-08 ENCOUNTER — Encounter: Payer: Self-pay | Admitting: *Deleted

## 2019-07-08 ENCOUNTER — Other Ambulatory Visit: Payer: Self-pay

## 2019-07-08 DIAGNOSIS — Z20828 Contact with and (suspected) exposure to other viral communicable diseases: Secondary | ICD-10-CM | POA: Diagnosis not present

## 2019-07-08 DIAGNOSIS — Z01812 Encounter for preprocedural laboratory examination: Secondary | ICD-10-CM | POA: Insufficient documentation

## 2019-07-09 LAB — SARS CORONAVIRUS 2 (TAT 6-24 HRS): SARS Coronavirus 2: NEGATIVE

## 2019-07-11 ENCOUNTER — Other Ambulatory Visit (INDEPENDENT_AMBULATORY_CARE_PROVIDER_SITE_OTHER): Payer: Self-pay | Admitting: Nurse Practitioner

## 2019-07-12 ENCOUNTER — Other Ambulatory Visit: Payer: Self-pay

## 2019-07-12 ENCOUNTER — Ambulatory Visit
Admission: RE | Admit: 2019-07-12 | Discharge: 2019-07-12 | Disposition: A | Discharge status: Home / Self Care | Payer: Medicare Other | Attending: Vascular Surgery | Admitting: Vascular Surgery

## 2019-07-12 ENCOUNTER — Encounter: Payer: Self-pay | Admitting: Emergency Medicine

## 2019-07-12 ENCOUNTER — Encounter: Payer: Self-pay | Admitting: *Deleted

## 2019-07-12 ENCOUNTER — Encounter
Admission: RE | Disposition: A | Discharge status: Home / Self Care | Payer: Self-pay | Source: Home / Self Care | Attending: Vascular Surgery

## 2019-07-12 DIAGNOSIS — C3490 Malignant neoplasm of unspecified part of unspecified bronchus or lung: Secondary | ICD-10-CM | POA: Insufficient documentation

## 2019-07-12 HISTORY — PX: PORTA CATH INSERTION: CATH118285

## 2019-07-12 LAB — GLUCOSE, CAPILLARY: Glucose-Capillary: 133 mg/dL — ABNORMAL HIGH (ref 70–99)

## 2019-07-12 SURGERY — PORTA CATH INSERTION
Anesthesia: Moderate Sedation

## 2019-07-12 MED ORDER — HYDROMORPHONE HCL 1 MG/ML IJ SOLN: 1.0000 mg | Freq: Once | INTRAMUSCULAR | Status: DC | PRN

## 2019-07-12 MED ORDER — MIDAZOLAM HCL 2 MG/2ML IJ SOLN
INTRAMUSCULAR | Status: DC | PRN
  Administered 2019-07-12: 2 mg via INTRAVENOUS
  Administered 2019-07-12: 1 mg via INTRAVENOUS

## 2019-07-12 MED ORDER — MIDAZOLAM HCL 2 MG/ML PO SYRP: 8.0000 mg | ORAL_SOLUTION | Freq: Once | ORAL | Status: DC | PRN

## 2019-07-12 MED ORDER — FAMOTIDINE 20 MG PO TABS: 40.0000 mg | ORAL_TABLET | Freq: Once | ORAL | Status: DC | PRN

## 2019-07-12 MED ORDER — DIPHENHYDRAMINE HCL 50 MG/ML IJ SOLN: 50.0000 mg | Freq: Once | INTRAMUSCULAR | Status: DC | PRN

## 2019-07-12 MED ORDER — SODIUM CHLORIDE 0.9 % IV SOLN
INTRAVENOUS | Status: DC
  Administered 2019-07-12: 07:00:00 via INTRAVENOUS

## 2019-07-12 MED ORDER — CEFAZOLIN SODIUM-DEXTROSE 2-4 GM/100ML-% IV SOLN
2.0000 g | Freq: Once | INTRAVENOUS | Status: AC
  Administered 2019-07-12: 2 g via INTRAVENOUS

## 2019-07-12 MED ORDER — METHYLPREDNISOLONE SODIUM SUCC 125 MG IJ SOLR: 125.0000 mg | Freq: Once | INTRAMUSCULAR | Status: DC | PRN

## 2019-07-12 MED ORDER — MIDAZOLAM HCL 5 MG/5ML IJ SOLN
INTRAMUSCULAR | Status: AC
  Filled 2019-07-12: qty 5

## 2019-07-12 MED ORDER — FENTANYL CITRATE (PF) 100 MCG/2ML IJ SOLN
INTRAMUSCULAR | Status: AC
  Filled 2019-07-12: qty 2

## 2019-07-12 MED ORDER — SODIUM CHLORIDE 0.9 % IV SOLN
Freq: Once | INTRAVENOUS | Status: AC
  Administered 2019-07-12: 09:00:00
  Filled 2019-07-12: qty 80

## 2019-07-12 MED ORDER — FENTANYL CITRATE (PF) 100 MCG/2ML IJ SOLN
INTRAMUSCULAR | Status: DC | PRN
  Administered 2019-07-12 (×2): 50 ug via INTRAVENOUS

## 2019-07-12 MED ORDER — ONDANSETRON HCL 4 MG/2ML IJ SOLN: 4.0000 mg | Freq: Four times a day (QID) | INTRAMUSCULAR | Status: DC | PRN

## 2019-07-12 SURGICAL SUPPLY — 8 items
KIT PORT POWER 8FR ISP CVUE (Port) ×3 IMPLANT
PACK ANGIOGRAPHY (CUSTOM PROCEDURE TRAY) ×3 IMPLANT
PAD GROUND ADULT SPLIT (MISCELLANEOUS) ×3 IMPLANT
PENCIL ELECTRO HAND CTR (MISCELLANEOUS) ×3 IMPLANT
SUT MNCRL AB 4-0 PS2 18 (SUTURE) ×3 IMPLANT
SUT PROLENE 0 CT 1 30 (SUTURE) ×3 IMPLANT
SUT VIC AB 3-0 SH 27 (SUTURE) ×2
SUT VIC AB 3-0 SH 27X BRD (SUTURE) ×1 IMPLANT

## 2019-07-12 NOTE — Op Note (Signed)
      [facility]       Operative Note  Date: 07/12/2019  Preoperative diagnosis:  1. Lung cancer  Postoperative diagnosis:  Same as above  Procedures: #1. Ultrasound guidance for vascular access to the left internal jugular vein. #2. Fluoroscopic guidance for placement of catheter. #3. Placement of CT compatible Port-A-Cath, left internal jugular vein.  Surgeon: Leotis Pain, MD.   Anesthesia: Local with moderate conscious sedation for approximately 20  minutes using 3 mg of Versed and 10 mcg of Fentanyl  Fluoroscopy time: less than 1 minute  Contrast used: 0  Estimated blood loss: 3 cc  Indication for the procedure:  The patient is a 68 y.o.male with lung cancer.  The patient needs a Port-A-Cath for durable venous access, chemotherapy, lab draws, and CT scans. We are asked to place this. Risks and benefits were discussed and informed consent was obtained.  Description of procedure: The patient was brought to the vascular and interventional radiology suite.  Moderate conscious sedation was administered throughout the procedure during a face to face encounter with the patient with my supervision of the RN administering medicines and monitoring the patient's vital signs, pulse oximetry, telemetry and mental status throughout from the start of the procedure until the patient was taken to the recovery room. The left neck chest and shoulder were sterilely prepped and draped, and a sterile surgical field was created. Ultrasound was used to help visualize a patent left internal jugular vein. This was then accessed under direct ultrasound guidance without difficulty with the Seldinger needle and a permanent image was recorded. A J-wire was placed. After skin nick and dilatation, the peel-away sheath was then placed over the wire. I then anesthetized an area under the clavicle approximately 1-2 fingerbreadths. A transverse incision was created and an inferior pocket was created  with electrocautery and blunt dissection. The port was then brought onto the field, placed into the pocket and secured to the chest wall with 2 Prolene sutures. The catheter was connected to the port and tunneled from the subclavicular incision to the access site. Fluoroscopic guidance was then used to cut the catheter to an appropriate length. The catheter was then placed through the peel-away sheath and the peel-away sheath was removed. The catheter tip was parked in excellent location under fluorocoscopic guidance in the SVC. The pocket was then irrigated with antibiotic impregnated saline and the wound was closed with a running 3-0 Vicryl and a 4-0 Monocryl. The access incision was closed with a single 4-0 Monocryl. The Huber needle was used to withdraw blood and flush the port with heparinized saline. Dermabond was then placed as a dressing. The patient tolerated the procedure well and was taken to the recovery room in stable condition.   Leotis Pain 07/12/2019 10:33 AM   This note was created with Dragon Medical transcription system. Any errors in dictation are purely unintentional.

## 2019-07-12 NOTE — Discharge Instructions (Signed)
Implanted Port Removal, Care After Refer to this sheet in the next few weeks. These instructions provide you with information about caring for yourself after your procedure. Your health care provider may also give you more specific instructions. Your treatment has been planned according to current medical practices, but problems sometimes occur. Call your health care provider if you have any problems or questions after your procedure. What can I expect after the procedure? After the procedure, it is common to have: Soreness or pain near your incision. Some swelling or bruising near your incision.  Follow these instructions at home: Medicines Take over-the-counter and prescription medicines only as told by your health care provider. If you were prescribed an antibiotic medicine, take it as told by your health care provider. Do not stop taking the antibiotic even if you start to feel better. Bathing You may shower tomorrow.  Incision care You have an incision with sutures that are dissolvable and skin glue over top incision.  This skin glue will wear off over time.  Do not peel or try to remove this yourself. Keep your dressing dry. Check your incision area every day for signs of infection and if present call your doctor and let them know. Check for: More redness, swelling, or pain. More fluid or blood. Warmth. Pus or a bad smell. Driving If you received a sedative, do not drive for 24 hours after the procedure. If you did not receive a sedative, ask your health care provider when it is safe to drive. Activity Return to your normal activities as told by your health care provider. Ask your health care provider what activities are safe for you. Until your health care provider says it is safe: Do not lift anything that is heavier than 10 lb (4.5 kg). Do not do activities that involve lifting your arms over your head. General instructions Do not use any tobacco products, such as cigarettes,  chewing tobacco, and e-cigarettes. Tobacco can delay healing. If you need help quitting, ask your health care provider. Keep all follow-up visits as told by your health care provider. This is important. Contact a health care provider if: You have more redness, swelling, or pain around your incision. You have more fluid or blood coming from your incision. Your incision feels warm to the touch. You have pus or a bad smell coming from your incision. You have a fever. You have pain that is not relieved by your pain medicine. Get help right away if: You have chest pain. You have difficulty breathing. This information is not intended to replace advice given to you by your health care provider. Make sure you discuss any questions you have with your health care provider. Document Released: 09/11/2015 Document Revised: 03/07/2016 Document Reviewed: 07/05/2015 Elsevier Interactive Patient Education  2018 Elsevier Inc.  

## 2019-07-14 ENCOUNTER — Ambulatory Visit
Admission: RE | Admit: 2019-07-14 | Discharge: 2019-07-14 | Disposition: A | Discharge status: Home / Self Care | Payer: Medicare Other | Source: Ambulatory Visit | Attending: Oncology | Admitting: Oncology

## 2019-07-14 ENCOUNTER — Other Ambulatory Visit: Payer: Self-pay

## 2019-07-14 ENCOUNTER — Telehealth: Payer: Self-pay | Admitting: *Deleted

## 2019-07-14 ENCOUNTER — Other Ambulatory Visit: Payer: Medicare Other

## 2019-07-14 DIAGNOSIS — N2889 Other specified disorders of kidney and ureter: Secondary | ICD-10-CM

## 2019-07-14 DIAGNOSIS — C7A8 Other malignant neuroendocrine tumors: Secondary | ICD-10-CM

## 2019-07-14 NOTE — Telephone Encounter (Signed)
Call from family stating that patient was unable to do scan and that he has others scheduled and that he needs medicine to help him be able to get these scans done, please advise

## 2019-07-14 NOTE — Telephone Encounter (Signed)
Yes he did not get his MR of Abdominal today, it will need to be rescheduled and he has MRI of brain scheduled for Monday

## 2019-07-14 NOTE — Telephone Encounter (Signed)
Does the patient need anti anxiety meds prior to scan? If so we can give him 0.5 mg Ativan to take 30 min prior.

## 2019-07-15 ENCOUNTER — Ambulatory Visit
Admission: RE | Admit: 2019-07-15 | Discharge: 2019-07-15 | Disposition: A | Discharge status: Home / Self Care | Payer: Medicare Other | Source: Ambulatory Visit | Attending: Radiation Oncology | Admitting: Radiation Oncology

## 2019-07-15 ENCOUNTER — Other Ambulatory Visit: Payer: Self-pay

## 2019-07-15 DIAGNOSIS — C7A1 Malignant poorly differentiated neuroendocrine tumors: Secondary | ICD-10-CM | POA: Insufficient documentation

## 2019-07-15 DIAGNOSIS — Z510 Encounter for antineoplastic radiation therapy: Secondary | ICD-10-CM | POA: Insufficient documentation

## 2019-07-15 MED ORDER — LORAZEPAM 0.5 MG PO TABS: ORAL_TABLET | ORAL | 0 refills | Status: DC

## 2019-07-16 ENCOUNTER — Other Ambulatory Visit: Payer: Medicare Other

## 2019-07-16 ENCOUNTER — Ambulatory Visit: Payer: Medicare Other

## 2019-07-16 ENCOUNTER — Ambulatory Visit: Payer: Medicare Other | Admitting: Oncology

## 2019-07-19 ENCOUNTER — Ambulatory Visit: Payer: Medicare Other

## 2019-07-19 ENCOUNTER — Ambulatory Visit
Admission: RE | Admit: 2019-07-19 | Discharge: 2019-07-19 | Disposition: A | Discharge status: Home / Self Care | Payer: Medicare Other | Source: Ambulatory Visit | Attending: Oncology | Admitting: Oncology

## 2019-07-19 ENCOUNTER — Other Ambulatory Visit: Payer: Self-pay

## 2019-07-19 ENCOUNTER — Other Ambulatory Visit: Payer: Self-pay | Admitting: *Deleted

## 2019-07-19 ENCOUNTER — Encounter: Payer: Self-pay | Admitting: *Deleted

## 2019-07-19 DIAGNOSIS — C7A8 Other malignant neuroendocrine tumors: Secondary | ICD-10-CM | POA: Diagnosis not present

## 2019-07-19 DIAGNOSIS — C3490 Malignant neoplasm of unspecified part of unspecified bronchus or lung: Secondary | ICD-10-CM | POA: Diagnosis not present

## 2019-07-19 DIAGNOSIS — C7A1 Malignant poorly differentiated neuroendocrine tumors: Secondary | ICD-10-CM | POA: Diagnosis not present

## 2019-07-19 DIAGNOSIS — Z510 Encounter for antineoplastic radiation therapy: Secondary | ICD-10-CM | POA: Diagnosis not present

## 2019-07-19 MED ORDER — GADOBUTROL 1 MMOL/ML IV SOLN
7.0000 mL | Freq: Once | INTRAVENOUS | Status: AC | PRN
  Administered 2019-07-19: 7 mL via INTRAVENOUS

## 2019-07-20 ENCOUNTER — Ambulatory Visit: Payer: Medicare Other

## 2019-07-20 ENCOUNTER — Inpatient Hospital Stay: Payer: Medicare Other

## 2019-07-20 ENCOUNTER — Inpatient Hospital Stay (HOSPITAL_BASED_OUTPATIENT_CLINIC_OR_DEPARTMENT_OTHER): Payer: Medicare Other | Admitting: Oncology

## 2019-07-20 ENCOUNTER — Ambulatory Visit
Admission: RE | Admit: 2019-07-20 | Discharge: 2019-07-20 | Disposition: A | Discharge status: Home / Self Care | Payer: Medicare Other | Source: Ambulatory Visit | Attending: Radiation Oncology | Admitting: Radiation Oncology

## 2019-07-20 ENCOUNTER — Encounter: Payer: Self-pay | Admitting: *Deleted

## 2019-07-20 ENCOUNTER — Inpatient Hospital Stay: Payer: Medicare Other | Attending: Oncology

## 2019-07-20 ENCOUNTER — Other Ambulatory Visit: Payer: Self-pay

## 2019-07-20 ENCOUNTER — Encounter: Payer: Self-pay | Admitting: Oncology

## 2019-07-20 VITALS — BP 115/69 | HR 74 | Temp 96.9°F | Resp 16 | Ht 68.0 in | Wt 178.7 lb

## 2019-07-20 DIAGNOSIS — Z85820 Personal history of malignant melanoma of skin: Secondary | ICD-10-CM | POA: Diagnosis not present

## 2019-07-20 DIAGNOSIS — Z9989 Dependence on other enabling machines and devices: Secondary | ICD-10-CM | POA: Insufficient documentation

## 2019-07-20 DIAGNOSIS — Z510 Encounter for antineoplastic radiation therapy: Secondary | ICD-10-CM | POA: Diagnosis not present

## 2019-07-20 DIAGNOSIS — Z79899 Other long term (current) drug therapy: Secondary | ICD-10-CM | POA: Insufficient documentation

## 2019-07-20 DIAGNOSIS — C3411 Malignant neoplasm of upper lobe, right bronchus or lung: Secondary | ICD-10-CM | POA: Diagnosis not present

## 2019-07-20 DIAGNOSIS — I2510 Atherosclerotic heart disease of native coronary artery without angina pectoris: Secondary | ICD-10-CM | POA: Insufficient documentation

## 2019-07-20 DIAGNOSIS — R0609 Other forms of dyspnea: Secondary | ICD-10-CM | POA: Insufficient documentation

## 2019-07-20 DIAGNOSIS — J449 Chronic obstructive pulmonary disease, unspecified: Secondary | ICD-10-CM | POA: Insufficient documentation

## 2019-07-20 DIAGNOSIS — T451X5A Adverse effect of antineoplastic and immunosuppressive drugs, initial encounter: Secondary | ICD-10-CM | POA: Diagnosis not present

## 2019-07-20 DIAGNOSIS — Z5111 Encounter for antineoplastic chemotherapy: Secondary | ICD-10-CM | POA: Diagnosis not present

## 2019-07-20 DIAGNOSIS — R21 Rash and other nonspecific skin eruption: Secondary | ICD-10-CM | POA: Diagnosis not present

## 2019-07-20 DIAGNOSIS — D6481 Anemia due to antineoplastic chemotherapy: Secondary | ICD-10-CM | POA: Insufficient documentation

## 2019-07-20 DIAGNOSIS — F17210 Nicotine dependence, cigarettes, uncomplicated: Secondary | ICD-10-CM | POA: Insufficient documentation

## 2019-07-20 DIAGNOSIS — C7A8 Other malignant neuroendocrine tumors: Secondary | ICD-10-CM | POA: Insufficient documentation

## 2019-07-20 DIAGNOSIS — E119 Type 2 diabetes mellitus without complications: Secondary | ICD-10-CM | POA: Diagnosis not present

## 2019-07-20 DIAGNOSIS — B379 Candidiasis, unspecified: Secondary | ICD-10-CM | POA: Diagnosis not present

## 2019-07-20 DIAGNOSIS — R5381 Other malaise: Secondary | ICD-10-CM | POA: Diagnosis not present

## 2019-07-20 DIAGNOSIS — R0781 Pleurodynia: Secondary | ICD-10-CM | POA: Diagnosis not present

## 2019-07-20 DIAGNOSIS — E785 Hyperlipidemia, unspecified: Secondary | ICD-10-CM | POA: Insufficient documentation

## 2019-07-20 DIAGNOSIS — C641 Malignant neoplasm of right kidney, except renal pelvis: Secondary | ICD-10-CM | POA: Diagnosis not present

## 2019-07-20 DIAGNOSIS — Z7982 Long term (current) use of aspirin: Secondary | ICD-10-CM | POA: Diagnosis not present

## 2019-07-20 DIAGNOSIS — I10 Essential (primary) hypertension: Secondary | ICD-10-CM | POA: Diagnosis not present

## 2019-07-20 DIAGNOSIS — Z7984 Long term (current) use of oral hypoglycemic drugs: Secondary | ICD-10-CM | POA: Insufficient documentation

## 2019-07-20 DIAGNOSIS — G4730 Sleep apnea, unspecified: Secondary | ICD-10-CM | POA: Insufficient documentation

## 2019-07-20 DIAGNOSIS — R5383 Other fatigue: Secondary | ICD-10-CM | POA: Diagnosis not present

## 2019-07-20 DIAGNOSIS — C7A1 Malignant poorly differentiated neuroendocrine tumors: Secondary | ICD-10-CM | POA: Diagnosis not present

## 2019-07-20 DIAGNOSIS — Z85828 Personal history of other malignant neoplasm of skin: Secondary | ICD-10-CM | POA: Insufficient documentation

## 2019-07-20 LAB — CBC WITH DIFFERENTIAL/PLATELET
Abs Immature Granulocytes: 0.05 10*3/uL (ref 0.00–0.07)
Basophils Absolute: 0.1 10*3/uL (ref 0.0–0.1)
Basophils Relative: 1 %
Eosinophils Absolute: 0.2 10*3/uL (ref 0.0–0.5)
Eosinophils Relative: 1 %
HCT: 37.1 % — ABNORMAL LOW (ref 39.0–52.0)
Hemoglobin: 12.1 g/dL — ABNORMAL LOW (ref 13.0–17.0)
Immature Granulocytes: 0 %
Lymphocytes Relative: 16 %
Lymphs Abs: 2.1 10*3/uL (ref 0.7–4.0)
MCH: 28.7 pg (ref 26.0–34.0)
MCHC: 32.6 g/dL (ref 30.0–36.0)
MCV: 88.1 fL (ref 80.0–100.0)
Monocytes Absolute: 1.1 10*3/uL — ABNORMAL HIGH (ref 0.1–1.0)
Monocytes Relative: 8 %
Neutro Abs: 9.4 10*3/uL — ABNORMAL HIGH (ref 1.7–7.7)
Neutrophils Relative %: 74 %
Platelets: 319 10*3/uL (ref 150–400)
RBC: 4.21 MIL/uL — ABNORMAL LOW (ref 4.22–5.81)
RDW: 13.7 % (ref 11.5–15.5)
WBC: 12.8 10*3/uL — ABNORMAL HIGH (ref 4.0–10.5)
nRBC: 0 % (ref 0.0–0.2)

## 2019-07-20 LAB — COMPREHENSIVE METABOLIC PANEL
ALT: 12 U/L (ref 0–44)
AST: 15 U/L (ref 15–41)
Albumin: 3.7 g/dL (ref 3.5–5.0)
Alkaline Phosphatase: 105 U/L (ref 38–126)
Anion gap: 7 (ref 5–15)
BUN: 11 mg/dL (ref 8–23)
CO2: 25 mmol/L (ref 22–32)
Calcium: 9 mg/dL (ref 8.9–10.3)
Chloride: 101 mmol/L (ref 98–111)
Creatinine, Ser: 1.27 mg/dL — ABNORMAL HIGH (ref 0.61–1.24)
GFR calc Af Amer: >60 mL/min (ref >60)
GFR calc non Af Amer: 57 mL/min — ABNORMAL LOW (ref >60)
Glucose, Bld: 121 mg/dL — ABNORMAL HIGH (ref 70–99)
Potassium: 4.1 mmol/L (ref 3.5–5.1)
Sodium: 133 mmol/L — ABNORMAL LOW (ref 135–145)
Total Bilirubin: 0.7 mg/dL (ref 0.3–1.2)
Total Protein: 7.8 g/dL (ref 6.5–8.1)

## 2019-07-20 MED ORDER — SODIUM CHLORIDE 0.9 % IV SOLN
Freq: Once | INTRAVENOUS | Status: AC
  Administered 2019-07-20: 10:00:00 via INTRAVENOUS
  Filled 2019-07-20: qty 250

## 2019-07-20 MED ORDER — PALONOSETRON HCL INJECTION 0.25 MG/5ML
0.2500 mg | Freq: Once | INTRAVENOUS | Status: AC
  Administered 2019-07-20: 10:00:00 0.25 mg via INTRAVENOUS
  Filled 2019-07-20: qty 5

## 2019-07-20 MED ORDER — HEPARIN SOD (PORK) LOCK FLUSH 100 UNIT/ML IV SOLN
500.0000 [IU] | Freq: Once | INTRAVENOUS | Status: AC
  Administered 2019-07-20: 13:00:00 500 [IU] via INTRAVENOUS
  Filled 2019-07-20: qty 5

## 2019-07-20 MED ORDER — SODIUM CHLORIDE 0.9 % IV SOLN
Freq: Once | INTRAVENOUS | Status: AC
  Administered 2019-07-20: 10:00:00 via INTRAVENOUS
  Filled 2019-07-20: qty 5

## 2019-07-20 MED ORDER — SODIUM CHLORIDE 0.9 % IV SOLN
439.5000 mg | Freq: Once | INTRAVENOUS | Status: AC
  Administered 2019-07-20: 11:00:00 440 mg via INTRAVENOUS
  Filled 2019-07-20: qty 44

## 2019-07-20 MED ORDER — SODIUM CHLORIDE 0.9% FLUSH
10.0000 mL | Freq: Once | INTRAVENOUS | Status: AC
  Administered 2019-07-20: 09:00:00 10 mL via INTRAVENOUS
  Filled 2019-07-20: qty 10

## 2019-07-20 MED ORDER — SODIUM CHLORIDE 0.9 % IV SOLN
100.0000 mg/m2 | Freq: Once | INTRAVENOUS | Status: AC
  Administered 2019-07-20: 200 mg via INTRAVENOUS
  Filled 2019-07-20: qty 10

## 2019-07-20 NOTE — Progress Notes (Signed)
  Oncology Nurse Navigator Documentation  Navigator Location: [facility] (07/20/19 1100)   )Navigator Encounter Type: Treatment (07/20/19 1100)                   Treatment Initiated Date: 07/20/19 (07/20/19 1100)   Treatment Phase: First Chemo Tx (07/20/19 1100) Barriers/Navigation Needs: No Barriers At This Time (07/20/19 1100)   Interventions: None Required (07/20/19 1100)                      Time Spent with Patient: 15 (07/20/19 1100)

## 2019-07-20 NOTE — Progress Notes (Signed)
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  As a part of his staging work-up he underwent CT chest as well as CT neck.  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  CT neck was unremarkable however CT chest showed a right upper lobe nodule 2.1 x 1.7 cm in with minimal spiculation as well as a 0.5 cm right lower lobe nodule.  No mediastinal or hilar adenopathy was noted.  Patient was referred to [facility] for further work-up given his preference.  He underwent a PET CT scan on 06/10/2019 which showed a hypermetabolic right upper lobe lung nodule measuring 2 cm.  Equivocal right infrahilar hypermetabolism with an SUV of 3.1.  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  Large right kidney mass measuring 9.7 x 9 cm.  Patient underwent CT-guided lung biopsy which was consistent with large cell neuroendocrine carcinoma.   Interval history-feels well overall today and denies any specific complaints.  He does have baseline shortness of breath on exertion and fatigue which are unchanged.  ECOG PS- 1 Pain scale- 0   Review of systems- Review of Systems  Constitutional: Positive for malaise/fatigue. Negative for chills, fever and  weight loss.  HENT: Negative for congestion, ear discharge and nosebleeds.   Eyes: Negative for blurred vision.  Respiratory: Positive for shortness of breath. Negative for cough, hemoptysis, sputum production and wheezing.   Cardiovascular: Negative for chest pain, palpitations, orthopnea and claudication.  Gastrointestinal: Negative for abdominal pain, blood in stool, constipation, diarrhea, heartburn, melena, nausea and vomiting.  Genitourinary: Negative for dysuria, flank pain, frequency, hematuria and urgency.  Musculoskeletal: Negative for back pain, joint pain and myalgias.  Skin: Negative for rash.  Neurological: Negative for dizziness, tingling, focal weakness, seizures, weakness and headaches.  Endo/Heme/Allergies: Does not bruise/bleed easily.  Psychiatric/Behavioral: Negative for depression and suicidal ideas. The patient does not have insomnia.       Allergies  Allergen Reactions  . Codeine Nausea And Vomiting and Other (See Comments)    Pt states when he takes codeine, he throws up blood  . Niacin And Related Other (See Comments)    "made his body feel hot & he had a hard time breathing"     Past Medical History:  Diagnosis Date  . Anxiety   . COPD (chronic obstructive pulmonary disease) ([place])   . Coronary artery disease 2006   Acute coronary syndrome in March 2006.   . Diabetes mellitus without complication ([place])   . Dyslipidemia   . Hypertension 10/09/2010   EF 40-45%  . Lung cancer ([place])   . 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 Sleep apnea    uses cpap machine     Past Surgical History:  Procedure Laterality Date  . Arterial evalation      no evidence of ICA stenosis  . CARDIAC CATHETERIZATION    . CARDIAC SURGERY    . CORONARY ARTERY BYPASS GRAFT  2006   Post CABG surgery with a LIMA to the LAD, a vein to the diagonal ,,a vein to the the obtuse marginal and vein to the PDA.  [name] CORONARY STENT PLACEMENT  2010  . EAR CYST EXCISION Right  10/05/2018   Procedure: Right Partial pinnectomy;  Surgeon: Izora Gala, MD;  Location: [place];  Service: ENT;  Laterality: Right;  . GRAFT(S) ANGIOGRAM  10/21/2011   Procedure: GRAFT(S) Cyril Loosen;  Surgeon: Leonie Man, MD;  Location: [facility] CATH LAB;  Service: Cardiovascular;;  . LEFT HEART CATHETERIZATION WITH CORONARY ANGIOGRAM N/A 10/21/2011   Procedure: LEFT HEART CATHETERIZATION WITH CORONARY ANGIOGRAM;  Surgeon: Leonie Man, MD;  Location: [facility] CATH LAB;  Service: Cardiovascular;  Laterality: N/A;  . LYMPH NODE BIOPSY Right 10/05/2018   Procedure: sentinel NODE BIOPSY with right partial modified neck dissection;  Surgeon: Izora Gala, MD;  Location: [place];  Service: ENT;  Laterality: Right;  . PORTA CATH INSERTION N/A 07/12/2019   Procedure: PORTA CATH INSERTION;  Surgeon: Algernon Huxley, MD;  Location: [place] CV LAB;  Service: Cardiovascular;  Laterality: N/A;    Social History   Socioeconomic History  . Marital status: Married    Spouse name: Not on file  . Number of children: Not on file  . Years of education: Not on file  . Highest education level: Not on file  Occupational History  . Not on file  Social Needs  . Financial resource strain: Not on file  . Food insecurity    Worry: Not on file    Inability: Not on file  . Transportation needs    Medical: Not on file    Non-medical: Not on file  Tobacco Use  . Smoking status: Current Every Day Smoker    Packs/day: 2.00    Years: 50.00    Pack years: 100.00    Types: Cigarettes  . Smokeless tobacco: Former Systems developer    Types: [place] date: 01/23/2013  Substance and Sexual Activity  . Alcohol use: No  . Drug use: Not Currently  . Sexual activity: Not Currently  Lifestyle  . Physical activity    Days per week: Not on file    Minutes per session: Not on file  . Stress: Not on file  Relationships  . Social Herbalist on phone: Not on file    Gets together: Not on file    Attends religious service:  Not on file    Active member of club or organization: Not on file    Attends meetings of clubs or organizations: Not on file    Relationship status: Not on file  . Intimate partner violence    Fear of current or ex partner: Not on file    Emotionally abused: Not on file    Physically abused: Not on file    Forced sexual activity: Not on file  Other Topics Concern  . Not on file  Social History Narrative  . Not on file    Family History  Problem Relation Age of Onset  . Hypertension Mother   . Heart attack Mother   . Heart attack Father 94     Current Outpatient Medications:  .  amLODipine (NORVASC) 10 MG tablet, Take 10 mg  by mouth daily., Disp: , Rfl:  .  aspirin EC 81 MG tablet, Take 81 mg by mouth daily., Disp: , Rfl:  .  atorvastatin (LIPITOR) 40 MG tablet, Take 1 tablet (40 mg total) by mouth at bedtime. Keep October Appointment, Disp: 90 tablet, Rfl: 0 .  busPIRone (BUSPAR) 7.5 MG tablet, Take 1 tablet by mouth 2 (two) times daily., Disp: , Rfl:  .  famotidine (PEPCID) 20 MG tablet, TAKE 1 TABLET (20 MG TOTAL) BY MOUTH DAILY. NEEDS APPOINTMENT FOR FUTURE REFILLS, Disp: 30 tablet, Rfl: 3 .  INCRUSE ELLIPTA 62.5 MCG/INH AEPB, Inhale 1 puff into the lungs daily. , Disp: , Rfl: 2 .  Ipratropium-Albuterol (COMBIVENT RESPIMAT) 20-100 MCG/ACT AERS respimat, Inhale 1 puff into the lungs every 6 (six) hours., Disp: , Rfl:  .  lidocaine-prilocaine (EMLA) cream, Apply to affected area once, Disp: 30 g, Rfl: 3 .  losartan-hydrochlorothiazide (HYZAAR) 100-12.5 MG tablet, Take 1 tablet by mouth daily., Disp: 30 tablet, Rfl: 10 .  nitroGLYCERIN (NITROSTAT) 0.4 MG SL tablet, Place 0.4 mg under the tongue every 5 (five) minutes as needed for chest pain., Disp: , Rfl:  .  ranolazine (RANEXA) 1000 MG SR tablet, TAKE 1 TABLET BY MOUTH TWICE A DAY, Disp: 60 tablet, Rfl: 11 .  ALPRAZolam (XANAX) 0.25 MG tablet, Take 0.25 mg by mouth 3 (three) times daily. , Disp: , Rfl:  .  dexamethasone  (DECADRON) 4 MG tablet, Take 2 tablets (8 mg total) by mouth daily. Start the day after chemotherapy for 1 day. (Patient not taking: Reported on 07/12/2019), Disp: 30 tablet, Rfl: 1 .  ezetimibe (ZETIA) 10 MG tablet, TAKE 1 TABLET BY MOUTH EVERY DAY (Patient not taking: No sig reported), Disp: 90 tablet, Rfl: 3 .  isosorbide mononitrate (IMDUR) 60 MG 24 hr tablet, TAKE 1 TABLET BY MOUTH EVERY DAY, Disp: 90 tablet, Rfl: 1 .  LORazepam (ATIVAN) 0.5 MG tablet, Take 1 tablet 30 minutes prior to each scan, Disp: 2 tablet, Rfl: 0 .  metFORMIN (GLUCOPHAGE) 500 MG tablet, TAKE 1 TABLET (500 MG TOTAL) BY MOUTH 2 (TWO) TIMES DAILY WITH A MEAL. (Patient not taking: Reported on 07/12/2019), Disp: 180 tablet, Rfl: 0 .  ondansetron (ZOFRAN) 8 MG tablet, Take 1 tablet (8 mg total) by mouth 2 (two) times daily as needed for refractory nausea / vomiting. Start on day 3 after carboplatin chemo. (Patient not taking: Reported on 07/12/2019), Disp: 30 tablet, Rfl: 1 .  prochlorperazine (COMPAZINE) 10 MG tablet, Take 1 tablet (10 mg total) by mouth every 6 (six) hours as needed (Nausea or vomiting). (Patient not taking: Reported on 07/12/2019), Disp: 30 tablet, Rfl: 1 No current facility-administered medications for this visit.   Facility-Administered Medications Ordered in Other Visits:  .  CARBOplatin (PARAPLATIN) 440 mg in sodium chloride 0.9 % 250 mL chemo infusion, 440 mg, Intravenous, Once, Sindy Guadeloupe, MD .  etoposide (VEPESID) 200 mg in sodium chloride 0.9 % 500 mL chemo infusion, 100 mg/m2 (Treatment Plan Recorded), Intravenous, Once, Sindy Guadeloupe, MD .  fosaprepitant (EMEND) 150 mg, dexamethasone (DECADRON) 12 mg in sodium chloride 0.9 % 145 mL IVPB, , Intravenous, Once, Randa Evens C, MD .  heparin lock flush 100 unit/mL, 500 Units, Intravenous, Once, Sindy Guadeloupe, MD .  palonosetron (ALOXI) injection 0.25 mg, 0.25 mg, Intravenous, Once, Sindy Guadeloupe, MD  Physical exam:  Vitals:   07/20/19 0854  BP:  115/69  Pulse: 74  Resp: 16  Temp: (!) 96.9 F (36.1  C)  TempSrc: Tympanic  Weight: 178 lb 11.2 oz (81.1 kg)  Height: 5\' 8"  (1.727 m)   Physical Exam Constitutional:      Comments: Partially absent right pinna  HENT:     Head: Normocephalic and atraumatic.  Eyes:     Pupils: Pupils are equal, round, and reactive to light.  Neck:     Musculoskeletal: Normal range of motion.  Cardiovascular:     Rate and Rhythm: Normal rate and regular rhythm.     Heart sounds: Normal heart sounds.  Pulmonary:     Effort: Pulmonary effort is normal.     Comments: Breath sounds decreased bilaterally Abdominal:     General: Bowel sounds are normal.     Palpations: Abdomen is soft.  Skin:    General: Skin is warm and dry.  Neurological:     Mental Status: He is alert and oriented to person, place, and time.      CMP Latest Ref Rng & Units 07/20/2019  Glucose 70 - 99 mg/dL 121(H)  BUN 8 - 23 mg/dL 11  Creatinine 0.61 - 1.24 mg/dL 1.27(H)  Sodium 135 - 145 mmol/L 133(L)  Potassium 3.5 - 5.1 mmol/L 4.1  Chloride 98 - 111 mmol/L 101  CO2 22 - 32 mmol/L 25  Calcium 8.9 - 10.3 mg/dL 9.0  Total Protein 6.5 - 8.1 g/dL 7.8  Total Bilirubin 0.3 - 1.2 mg/dL 0.7  Alkaline Phos 38 - 126 U/L 105  AST 15 - 41 U/L 15  ALT 0 - 44 U/L 12   CBC Latest Ref Rng & Units 07/20/2019  WBC 4.0 - 10.5 K/uL 12.8(H)  Hemoglobin 13.0 - 17.0 g/dL 12.1(L)  Hematocrit 39.0 - 52.0 % 37.1(L)  Platelets 150 - 400 K/uL 319    No images are attached to the encounter.  Ct Biopsy  Result Date: 06/23/2019 INDICATION: 68 year old male with a history of FDG avid right lung nodule EXAM: CT BIOPSY MEDICATIONS: None. ANESTHESIA/SEDATION: Moderate (conscious) sedation was employed during this procedure. A total of Versed 2.0 mg and Fentanyl 100 mcg was administered intravenously. Moderate Sedation Time: 15 minutes. The patient's level of consciousness and vital signs were monitored continuously by radiology nursing throughout  the procedure under my direct supervision. FLUOROSCOPY TIME:  CT COMPLICATIONS: None PROCEDURE: The procedure, risks, benefits, and alternatives were explained to the patient and the patient's family. Specific risks that were addressed included bleeding, infection, pneumothorax, need for further procedure including chest tube placement, chance of delayed pneumothorax or hemorrhage, hemoptysis, nondiagnostic sample, cardiopulmonary collapse, death. Questions regarding the procedure were encouraged and answered. The patient understands and consents to the procedure. Patient was positioned in the supine position on the CT gantry table and a scout CT of the chest was performed for planning purposes. Once angle of approach was determined, the skin and subcutaneous tissues this scan was prepped and draped in the usual sterile fashion, and a sterile drape was applied covering the operative field. A sterile gown and sterile gloves were used for the procedure. Local anesthesia was provided with 1% Lidocaine. The skin and subcutaneous tissues were infiltrated 1% lidocaine for local anesthesia, and a small stab incision was made with an 11 blade scalpel. Using CT guidance, a 17 gauge trocar needle was advanced into the right upper lobetarget. After confirmation of the tip, separate 18 gauge core biopsies were performed. These were placed into solution for transportation to the lab. Biosentry Device was deployed. A final CT image was performed. Patient tolerated the  procedure well and remained hemodynamically stable throughout. No complications were encountered and no significant blood loss was encounter IMPRESSION: Status post CT-guided biopsy of right upper lobe nodule. Tissue specimen sent to pathology for complete histopathologic analysis. Signed, Dulcy Fanny. Dellia Nims, RPVI [facility] Electronically Signed   By: Corrie Mckusick D.O.   On: 06/23/2019 12:39   Dg Chest  Port 1 View  Result Date: 06/23/2019 CLINICAL DATA:  68 year old male with a history of right lung nodule biopsy EXAM: PORTABLE CHEST 1 VIEW COMPARISON:  PET-CT 06/10/2019, chest x-ray 06/23/2019 at 3:08 p.m. FINDINGS: Cardiomediastinal silhouette unchanged in size and contour. Surgical changes of median sternotomy and CABG. Small right pneumothorax, unchanged from the comparison. Gas along the right lateral chest wall again noted as well as the nodule of the right lung. Chronic lung changes. IMPRESSION: Unchanged size of small right-sided pneumothorax. Electronically Signed   By: Corrie Mckusick D.O.   On: 06/23/2019 15:03   Dg Chest Port 1 View  Result Date: 06/23/2019 CLINICAL DATA:  68 year old male with a history of right lung nodule biopsy EXAM: PORTABLE CHEST 1 VIEW COMPARISON:  PET-CT 06/10/2019 FINDINGS: Cardiomediastinal silhouette unchanged in size and contour. Surgical changes of median sternotomy and CABG. Calcifications of the aortic arch. Small right pneumothorax. Nodule in the right lateral lung corresponding to the right lung nodule. Gas along the right chest wall soft tissues. IMPRESSION: Small right pneumothorax, with gas along the right lateral chest wall corresponding to recent biopsy. Electronically Signed   By: Corrie Mckusick D.O.   On: 06/23/2019 15:00   Ct Outside Films Chest  Result Date: 06/30/2019 This examination belongs to an outside facility and is stored here for comparison purposes only.  Contact the originating outside institution for any associated report or interpretation.  Ct Outside Films Head/face  Result Date: 06/30/2019 This examination belongs to an outside facility and is stored here for comparison purposes only.  Contact the originating outside institution for any associated report or interpretation.    Assessment and plan- Patient is a 68 y.o. male  with newly diagnosed neuroendocrine carcinoma of the right lung stage I T1 cN0 M0.  He is here for on treatment  assessment prior to cycle 1 of carboplatin and etoposide chemotherapy  Patient began his radiation treatment today.  Plan is to offer him 4 cycles of carbo etoposide chemotherapy with a curative intent.  Counts are okay to proceed with cycle 1 of carboplatin AUC 5 and etoposide 100 mg per metered square on day 1 and etoposide on day 2 and day 3.  He will not be receiving growth factor support due to concomitant radiation.  He does have baseline renal dysfunction with a creatinine of 1.2 which we will monitor closely.  I have encouraged patient to drink plenty of oral fluids during chemotherapy.  I will see him back in 1 week's time with a repeat CBC with differential, CMP for possible IV fluids and/or potassium.  MRI brain has been done and is currently pending  Renal mass: Patient did not go for MRI abdomen previously due to claustrophobia.  He is now rescheduled for 07/28/2019 and we will give him Ativan prior to the test.  He has an appointment with Dr. [name] on 08/03/2019.   Visit Diagnosis 1. Malignant neoplasm of upper lobe of right lung ([place])   2. Encounter for antineoplastic chemotherapy      Dr. Randa Evens, MD, MPH [place] at [facility]  [id number] 07/20/2019 9:49 AM

## 2019-07-20 NOTE — Progress Notes (Signed)
Pt here today for first tx. Not eating as much as he was.

## 2019-07-21 ENCOUNTER — Other Ambulatory Visit: Payer: Self-pay

## 2019-07-21 ENCOUNTER — Ambulatory Visit
Admission: RE | Admit: 2019-07-21 | Discharge: 2019-07-21 | Disposition: A | Discharge status: Home / Self Care | Payer: Medicare Other | Source: Ambulatory Visit | Attending: Radiation Oncology | Admitting: Radiation Oncology

## 2019-07-21 ENCOUNTER — Inpatient Hospital Stay: Payer: Medicare Other

## 2019-07-21 VITALS — BP 120/70 | HR 76 | Resp 18

## 2019-07-21 DIAGNOSIS — R0609 Other forms of dyspnea: Secondary | ICD-10-CM | POA: Diagnosis not present

## 2019-07-21 DIAGNOSIS — R5381 Other malaise: Secondary | ICD-10-CM | POA: Diagnosis not present

## 2019-07-21 DIAGNOSIS — C3411 Malignant neoplasm of upper lobe, right bronchus or lung: Secondary | ICD-10-CM

## 2019-07-21 DIAGNOSIS — B379 Candidiasis, unspecified: Secondary | ICD-10-CM | POA: Diagnosis not present

## 2019-07-21 DIAGNOSIS — C7A1 Malignant poorly differentiated neuroendocrine tumors: Secondary | ICD-10-CM | POA: Diagnosis not present

## 2019-07-21 DIAGNOSIS — Z7984 Long term (current) use of oral hypoglycemic drugs: Secondary | ICD-10-CM | POA: Diagnosis not present

## 2019-07-21 DIAGNOSIS — Z85820 Personal history of malignant melanoma of skin: Secondary | ICD-10-CM | POA: Diagnosis not present

## 2019-07-21 DIAGNOSIS — Z79899 Other long term (current) drug therapy: Secondary | ICD-10-CM | POA: Diagnosis not present

## 2019-07-21 DIAGNOSIS — Z7982 Long term (current) use of aspirin: Secondary | ICD-10-CM | POA: Diagnosis not present

## 2019-07-21 DIAGNOSIS — Z510 Encounter for antineoplastic radiation therapy: Secondary | ICD-10-CM | POA: Diagnosis not present

## 2019-07-21 DIAGNOSIS — E119 Type 2 diabetes mellitus without complications: Secondary | ICD-10-CM | POA: Diagnosis not present

## 2019-07-21 DIAGNOSIS — Z9989 Dependence on other enabling machines and devices: Secondary | ICD-10-CM | POA: Diagnosis not present

## 2019-07-21 DIAGNOSIS — T451X5A Adverse effect of antineoplastic and immunosuppressive drugs, initial encounter: Secondary | ICD-10-CM | POA: Diagnosis not present

## 2019-07-21 DIAGNOSIS — E785 Hyperlipidemia, unspecified: Secondary | ICD-10-CM | POA: Diagnosis not present

## 2019-07-21 DIAGNOSIS — R0781 Pleurodynia: Secondary | ICD-10-CM | POA: Diagnosis not present

## 2019-07-21 DIAGNOSIS — J449 Chronic obstructive pulmonary disease, unspecified: Secondary | ICD-10-CM | POA: Diagnosis not present

## 2019-07-21 DIAGNOSIS — R21 Rash and other nonspecific skin eruption: Secondary | ICD-10-CM | POA: Diagnosis not present

## 2019-07-21 DIAGNOSIS — I10 Essential (primary) hypertension: Secondary | ICD-10-CM | POA: Diagnosis not present

## 2019-07-21 DIAGNOSIS — I2510 Atherosclerotic heart disease of native coronary artery without angina pectoris: Secondary | ICD-10-CM | POA: Diagnosis not present

## 2019-07-21 DIAGNOSIS — C7A8 Other malignant neuroendocrine tumors: Secondary | ICD-10-CM | POA: Diagnosis not present

## 2019-07-21 DIAGNOSIS — Z5111 Encounter for antineoplastic chemotherapy: Secondary | ICD-10-CM | POA: Diagnosis not present

## 2019-07-21 DIAGNOSIS — R5383 Other fatigue: Secondary | ICD-10-CM | POA: Diagnosis not present

## 2019-07-21 DIAGNOSIS — D6481 Anemia due to antineoplastic chemotherapy: Secondary | ICD-10-CM | POA: Diagnosis not present

## 2019-07-21 DIAGNOSIS — G4730 Sleep apnea, unspecified: Secondary | ICD-10-CM | POA: Diagnosis not present

## 2019-07-21 DIAGNOSIS — C641 Malignant neoplasm of right kidney, except renal pelvis: Secondary | ICD-10-CM | POA: Diagnosis not present

## 2019-07-21 MED ORDER — SODIUM CHLORIDE 0.9 % IV SOLN: 10.0000 mg | Freq: Once | INTRAVENOUS | Status: DC

## 2019-07-21 MED ORDER — SODIUM CHLORIDE 0.9 % IV SOLN
100.0000 mg/m2 | Freq: Once | INTRAVENOUS | Status: AC
  Administered 2019-07-21: 14:00:00 200 mg via INTRAVENOUS
  Filled 2019-07-21: qty 10

## 2019-07-21 MED ORDER — DEXAMETHASONE SODIUM PHOSPHATE 10 MG/ML IJ SOLN
10.0000 mg | Freq: Once | INTRAMUSCULAR | Status: AC
  Administered 2019-07-21: 10 mg via INTRAVENOUS
  Filled 2019-07-21: qty 1

## 2019-07-21 MED ORDER — SODIUM CHLORIDE 0.9 % IV SOLN
Freq: Once | INTRAVENOUS | Status: AC
  Administered 2019-07-21: 14:00:00 via INTRAVENOUS
  Filled 2019-07-21: qty 250

## 2019-07-21 MED ORDER — HEPARIN SOD (PORK) LOCK FLUSH 100 UNIT/ML IV SOLN
500.0000 [IU] | Freq: Once | INTRAVENOUS | Status: AC
  Administered 2019-07-21: 16:00:00 500 [IU] via INTRAVENOUS
  Filled 2019-07-21: qty 5

## 2019-07-21 MED ORDER — SODIUM CHLORIDE 0.9% FLUSH
10.0000 mL | INTRAVENOUS | Status: DC | PRN
  Administered 2019-07-21: 10 mL via INTRAVENOUS
  Filled 2019-07-21: qty 10

## 2019-07-21 NOTE — Progress Notes (Signed)
Day two of etoposide, port accessed on arrival from previous day.  When flushing no blood return, port flushes fine, no pain, tenderness, or swelling.  Proceed with treatment today per Dr. Janese Banks.

## 2019-07-22 ENCOUNTER — Inpatient Hospital Stay: Payer: Medicare Other

## 2019-07-22 ENCOUNTER — Ambulatory Visit
Admission: RE | Admit: 2019-07-22 | Discharge: 2019-07-22 | Disposition: A | Discharge status: Home / Self Care | Payer: Medicare Other | Source: Ambulatory Visit | Attending: Radiation Oncology | Admitting: Radiation Oncology

## 2019-07-22 ENCOUNTER — Telehealth: Payer: Self-pay

## 2019-07-22 ENCOUNTER — Other Ambulatory Visit: Payer: Self-pay

## 2019-07-22 VITALS — BP 123/64 | HR 80 | Temp 97.7°F | Resp 17

## 2019-07-22 DIAGNOSIS — C7A1 Malignant poorly differentiated neuroendocrine tumors: Secondary | ICD-10-CM | POA: Diagnosis not present

## 2019-07-22 DIAGNOSIS — R0609 Other forms of dyspnea: Secondary | ICD-10-CM | POA: Diagnosis not present

## 2019-07-22 DIAGNOSIS — R0781 Pleurodynia: Secondary | ICD-10-CM | POA: Diagnosis not present

## 2019-07-22 DIAGNOSIS — R5381 Other malaise: Secondary | ICD-10-CM | POA: Diagnosis not present

## 2019-07-22 DIAGNOSIS — C3411 Malignant neoplasm of upper lobe, right bronchus or lung: Secondary | ICD-10-CM

## 2019-07-22 DIAGNOSIS — J449 Chronic obstructive pulmonary disease, unspecified: Secondary | ICD-10-CM | POA: Diagnosis not present

## 2019-07-22 DIAGNOSIS — Z85820 Personal history of malignant melanoma of skin: Secondary | ICD-10-CM | POA: Diagnosis not present

## 2019-07-22 DIAGNOSIS — Z79899 Other long term (current) drug therapy: Secondary | ICD-10-CM | POA: Diagnosis not present

## 2019-07-22 DIAGNOSIS — D6481 Anemia due to antineoplastic chemotherapy: Secondary | ICD-10-CM | POA: Diagnosis not present

## 2019-07-22 DIAGNOSIS — E119 Type 2 diabetes mellitus without complications: Secondary | ICD-10-CM | POA: Diagnosis not present

## 2019-07-22 DIAGNOSIS — Z7982 Long term (current) use of aspirin: Secondary | ICD-10-CM | POA: Diagnosis not present

## 2019-07-22 DIAGNOSIS — R5383 Other fatigue: Secondary | ICD-10-CM | POA: Diagnosis not present

## 2019-07-22 DIAGNOSIS — T451X5A Adverse effect of antineoplastic and immunosuppressive drugs, initial encounter: Secondary | ICD-10-CM | POA: Diagnosis not present

## 2019-07-22 DIAGNOSIS — Z7984 Long term (current) use of oral hypoglycemic drugs: Secondary | ICD-10-CM | POA: Diagnosis not present

## 2019-07-22 DIAGNOSIS — B379 Candidiasis, unspecified: Secondary | ICD-10-CM | POA: Diagnosis not present

## 2019-07-22 DIAGNOSIS — R21 Rash and other nonspecific skin eruption: Secondary | ICD-10-CM | POA: Diagnosis not present

## 2019-07-22 DIAGNOSIS — Z9989 Dependence on other enabling machines and devices: Secondary | ICD-10-CM | POA: Diagnosis not present

## 2019-07-22 DIAGNOSIS — I2510 Atherosclerotic heart disease of native coronary artery without angina pectoris: Secondary | ICD-10-CM | POA: Diagnosis not present

## 2019-07-22 DIAGNOSIS — C641 Malignant neoplasm of right kidney, except renal pelvis: Secondary | ICD-10-CM | POA: Diagnosis not present

## 2019-07-22 DIAGNOSIS — Z5111 Encounter for antineoplastic chemotherapy: Secondary | ICD-10-CM | POA: Diagnosis not present

## 2019-07-22 DIAGNOSIS — Z510 Encounter for antineoplastic radiation therapy: Secondary | ICD-10-CM | POA: Diagnosis not present

## 2019-07-22 DIAGNOSIS — C7A8 Other malignant neuroendocrine tumors: Secondary | ICD-10-CM | POA: Diagnosis not present

## 2019-07-22 DIAGNOSIS — I10 Essential (primary) hypertension: Secondary | ICD-10-CM | POA: Diagnosis not present

## 2019-07-22 DIAGNOSIS — G4730 Sleep apnea, unspecified: Secondary | ICD-10-CM | POA: Diagnosis not present

## 2019-07-22 DIAGNOSIS — E785 Hyperlipidemia, unspecified: Secondary | ICD-10-CM | POA: Diagnosis not present

## 2019-07-22 MED ORDER — SODIUM CHLORIDE 0.9 % IV SOLN
Freq: Once | INTRAVENOUS | Status: AC
  Administered 2019-07-22: 13:00:00 via INTRAVENOUS
  Filled 2019-07-22: qty 250

## 2019-07-22 MED ORDER — HEPARIN SOD (PORK) LOCK FLUSH 100 UNIT/ML IV SOLN
500.0000 [IU] | Freq: Once | INTRAVENOUS | Status: AC | PRN
  Administered 2019-07-22: 500 [IU]
  Filled 2019-07-22: qty 5

## 2019-07-22 MED ORDER — SODIUM CHLORIDE 0.9 % IV SOLN: 10.0000 mg | Freq: Once | INTRAVENOUS | Status: DC

## 2019-07-22 MED ORDER — SODIUM CHLORIDE 0.9% FLUSH
10.0000 mL | INTRAVENOUS | Status: DC | PRN
  Administered 2019-07-22: 10 mL
  Filled 2019-07-22: qty 10

## 2019-07-22 MED ORDER — SODIUM CHLORIDE 0.9 % IV SOLN
100.0000 mg/m2 | Freq: Once | INTRAVENOUS | Status: AC
  Administered 2019-07-22: 200 mg via INTRAVENOUS
  Filled 2019-07-22: qty 10

## 2019-07-22 MED ORDER — DEXAMETHASONE SODIUM PHOSPHATE 10 MG/ML IJ SOLN
10.0000 mg | Freq: Once | INTRAMUSCULAR | Status: AC
  Administered 2019-07-22: 10 mg via INTRAVENOUS
  Filled 2019-07-22: qty 1

## 2019-07-22 NOTE — Telephone Encounter (Signed)
Telephone call to patients home and daughter answered phone.   Informed daughter I am calling for follow up after first infusion and she states patient is doing much better than she anticipated.  She said she is amazed at how well he is doing.  Encouraged daughter to call for any questions or concerns.  Daughter thanked me for calling.

## 2019-07-23 ENCOUNTER — Ambulatory Visit
Admission: RE | Admit: 2019-07-23 | Discharge: 2019-07-23 | Disposition: A | Discharge status: Home / Self Care | Payer: Medicare Other | Source: Ambulatory Visit | Attending: Radiation Oncology | Admitting: Radiation Oncology

## 2019-07-23 ENCOUNTER — Other Ambulatory Visit: Payer: Self-pay

## 2019-07-23 DIAGNOSIS — C7A1 Malignant poorly differentiated neuroendocrine tumors: Secondary | ICD-10-CM | POA: Diagnosis not present

## 2019-07-23 DIAGNOSIS — Z510 Encounter for antineoplastic radiation therapy: Secondary | ICD-10-CM | POA: Diagnosis not present

## 2019-07-26 ENCOUNTER — Other Ambulatory Visit: Payer: Self-pay

## 2019-07-26 ENCOUNTER — Ambulatory Visit
Admission: RE | Admit: 2019-07-26 | Discharge: 2019-07-26 | Disposition: A | Discharge status: Home / Self Care | Payer: Medicare Other | Source: Ambulatory Visit | Attending: Radiation Oncology | Admitting: Radiation Oncology

## 2019-07-26 DIAGNOSIS — Z510 Encounter for antineoplastic radiation therapy: Secondary | ICD-10-CM | POA: Diagnosis not present

## 2019-07-26 DIAGNOSIS — C7A1 Malignant poorly differentiated neuroendocrine tumors: Secondary | ICD-10-CM | POA: Diagnosis not present

## 2019-07-27 ENCOUNTER — Ambulatory Visit
Admission: RE | Admit: 2019-07-27 | Discharge: 2019-07-27 | Disposition: A | Discharge status: Home / Self Care | Payer: Medicare Other | Source: Ambulatory Visit | Attending: Radiation Oncology | Admitting: Radiation Oncology

## 2019-07-27 ENCOUNTER — Other Ambulatory Visit: Payer: Self-pay | Admitting: *Deleted

## 2019-07-27 ENCOUNTER — Inpatient Hospital Stay: Payer: Medicare Other

## 2019-07-27 ENCOUNTER — Telehealth: Payer: Self-pay | Admitting: *Deleted

## 2019-07-27 ENCOUNTER — Other Ambulatory Visit: Payer: Self-pay

## 2019-07-27 ENCOUNTER — Inpatient Hospital Stay (HOSPITAL_BASED_OUTPATIENT_CLINIC_OR_DEPARTMENT_OTHER): Payer: Medicare Other | Admitting: Oncology

## 2019-07-27 ENCOUNTER — Encounter: Payer: Self-pay | Admitting: *Deleted

## 2019-07-27 ENCOUNTER — Encounter: Payer: Self-pay | Admitting: Oncology

## 2019-07-27 VITALS — BP 131/62 | HR 102 | Temp 98.6°F | Resp 18

## 2019-07-27 DIAGNOSIS — I2510 Atherosclerotic heart disease of native coronary artery without angina pectoris: Secondary | ICD-10-CM | POA: Diagnosis not present

## 2019-07-27 DIAGNOSIS — C3411 Malignant neoplasm of upper lobe, right bronchus or lung: Secondary | ICD-10-CM

## 2019-07-27 DIAGNOSIS — Z79899 Other long term (current) drug therapy: Secondary | ICD-10-CM | POA: Diagnosis not present

## 2019-07-27 DIAGNOSIS — Z5111 Encounter for antineoplastic chemotherapy: Secondary | ICD-10-CM | POA: Diagnosis not present

## 2019-07-27 DIAGNOSIS — R0781 Pleurodynia: Secondary | ICD-10-CM

## 2019-07-27 DIAGNOSIS — Z9989 Dependence on other enabling machines and devices: Secondary | ICD-10-CM | POA: Diagnosis not present

## 2019-07-27 DIAGNOSIS — C641 Malignant neoplasm of right kidney, except renal pelvis: Secondary | ICD-10-CM | POA: Diagnosis not present

## 2019-07-27 DIAGNOSIS — R5381 Other malaise: Secondary | ICD-10-CM | POA: Diagnosis not present

## 2019-07-27 DIAGNOSIS — Z85820 Personal history of malignant melanoma of skin: Secondary | ICD-10-CM | POA: Diagnosis not present

## 2019-07-27 DIAGNOSIS — C7A8 Other malignant neuroendocrine tumors: Secondary | ICD-10-CM | POA: Diagnosis not present

## 2019-07-27 DIAGNOSIS — R5383 Other fatigue: Secondary | ICD-10-CM | POA: Diagnosis not present

## 2019-07-27 DIAGNOSIS — E119 Type 2 diabetes mellitus without complications: Secondary | ICD-10-CM | POA: Diagnosis not present

## 2019-07-27 DIAGNOSIS — C7A1 Malignant poorly differentiated neuroendocrine tumors: Secondary | ICD-10-CM | POA: Diagnosis not present

## 2019-07-27 DIAGNOSIS — E785 Hyperlipidemia, unspecified: Secondary | ICD-10-CM | POA: Diagnosis not present

## 2019-07-27 DIAGNOSIS — Z510 Encounter for antineoplastic radiation therapy: Secondary | ICD-10-CM | POA: Diagnosis not present

## 2019-07-27 DIAGNOSIS — G4730 Sleep apnea, unspecified: Secondary | ICD-10-CM | POA: Diagnosis not present

## 2019-07-27 DIAGNOSIS — J449 Chronic obstructive pulmonary disease, unspecified: Secondary | ICD-10-CM | POA: Diagnosis not present

## 2019-07-27 DIAGNOSIS — Z7984 Long term (current) use of oral hypoglycemic drugs: Secondary | ICD-10-CM | POA: Diagnosis not present

## 2019-07-27 DIAGNOSIS — R0609 Other forms of dyspnea: Secondary | ICD-10-CM | POA: Diagnosis not present

## 2019-07-27 DIAGNOSIS — D6481 Anemia due to antineoplastic chemotherapy: Secondary | ICD-10-CM | POA: Diagnosis not present

## 2019-07-27 DIAGNOSIS — B379 Candidiasis, unspecified: Secondary | ICD-10-CM | POA: Diagnosis not present

## 2019-07-27 DIAGNOSIS — R21 Rash and other nonspecific skin eruption: Secondary | ICD-10-CM | POA: Diagnosis not present

## 2019-07-27 DIAGNOSIS — B370 Candidal stomatitis: Secondary | ICD-10-CM | POA: Diagnosis not present

## 2019-07-27 DIAGNOSIS — Z7982 Long term (current) use of aspirin: Secondary | ICD-10-CM | POA: Diagnosis not present

## 2019-07-27 DIAGNOSIS — T451X5A Adverse effect of antineoplastic and immunosuppressive drugs, initial encounter: Secondary | ICD-10-CM | POA: Diagnosis not present

## 2019-07-27 DIAGNOSIS — I10 Essential (primary) hypertension: Secondary | ICD-10-CM | POA: Diagnosis not present

## 2019-07-27 LAB — CBC WITH DIFFERENTIAL/PLATELET
Abs Immature Granulocytes: 0.31 10*3/uL — ABNORMAL HIGH (ref 0.00–0.07)
Basophils Absolute: 0 10*3/uL (ref 0.0–0.1)
Basophils Relative: 1 %
Eosinophils Absolute: 0.1 10*3/uL (ref 0.0–0.5)
Eosinophils Relative: 3 %
HCT: 35.4 % — ABNORMAL LOW (ref 39.0–52.0)
Hemoglobin: 11.7 g/dL — ABNORMAL LOW (ref 13.0–17.0)
Immature Granulocytes: 8 %
Lymphocytes Relative: 24 %
Lymphs Abs: 0.9 10*3/uL (ref 0.7–4.0)
MCH: 29.1 pg (ref 26.0–34.0)
MCHC: 33.1 g/dL (ref 30.0–36.0)
MCV: 88.1 fL (ref 80.0–100.0)
Monocytes Absolute: 0 10*3/uL — ABNORMAL LOW (ref 0.1–1.0)
Monocytes Relative: 1 %
Neutro Abs: 2.4 10*3/uL (ref 1.7–7.7)
Neutrophils Relative %: 63 %
Platelets: 138 10*3/uL — ABNORMAL LOW (ref 150–400)
RBC: 4.02 MIL/uL — ABNORMAL LOW (ref 4.22–5.81)
RDW: 14 % (ref 11.5–15.5)
Smear Review: NORMAL
WBC: 3.8 10*3/uL — ABNORMAL LOW (ref 4.0–10.5)
nRBC: 0 % (ref 0.0–0.2)

## 2019-07-27 LAB — COMPREHENSIVE METABOLIC PANEL
ALT: 13 U/L (ref 0–44)
AST: 12 U/L — ABNORMAL LOW (ref 15–41)
Albumin: 3.3 g/dL — ABNORMAL LOW (ref 3.5–5.0)
Alkaline Phosphatase: 61 U/L (ref 38–126)
Anion gap: 9 (ref 5–15)
BUN: 22 mg/dL (ref 8–23)
CO2: 26 mmol/L (ref 22–32)
Calcium: 8.9 mg/dL (ref 8.9–10.3)
Chloride: 99 mmol/L (ref 98–111)
Creatinine, Ser: 0.96 mg/dL (ref 0.61–1.24)
GFR calc Af Amer: >60 mL/min (ref >60)
GFR calc non Af Amer: >60 mL/min (ref >60)
Glucose, Bld: 178 mg/dL — ABNORMAL HIGH (ref 70–99)
Potassium: 4.6 mmol/L (ref 3.5–5.1)
Sodium: 134 mmol/L — ABNORMAL LOW (ref 135–145)
Total Bilirubin: 1.7 mg/dL — ABNORMAL HIGH (ref 0.3–1.2)
Total Protein: 6.9 g/dL (ref 6.5–8.1)

## 2019-07-27 MED ORDER — BACLOFEN 5 MG PO TABS: 5.0000 mg | ORAL_TABLET | Freq: Three times a day (TID) | ORAL | 0 refills | Status: DC

## 2019-07-27 MED ORDER — SODIUM CHLORIDE 0.9% FLUSH
10.0000 mL | Freq: Once | INTRAVENOUS | Status: AC
  Administered 2019-07-27: 10 mL via INTRAVENOUS
  Filled 2019-07-27: qty 10

## 2019-07-27 MED ORDER — HEPARIN SOD (PORK) LOCK FLUSH 100 UNIT/ML IV SOLN: 500.0000 [IU] | Freq: Once | INTRAVENOUS | Status: AC

## 2019-07-27 MED ORDER — NYSTATIN 100000 UNIT/ML MT SUSP: 5.0000 mL | Freq: Four times a day (QID) | OROMUCOSAL | 0 refills | Status: DC

## 2019-07-27 MED ORDER — SODIUM CHLORIDE 0.9% FLUSH
10.0000 mL | INTRAVENOUS | Status: AC | PRN
  Filled 2019-07-27: qty 10

## 2019-07-27 MED ORDER — OXYCODONE HCL 5 MG PO TABS: 5.0000 mg | ORAL_TABLET | ORAL | 0 refills | Status: DC | PRN

## 2019-07-27 MED ORDER — FLUCONAZOLE 100 MG PO TABS: 100.0000 mg | ORAL_TABLET | Freq: Every day | ORAL | 0 refills | Status: DC

## 2019-07-27 NOTE — Telephone Encounter (Signed)
Called and spoke to wife about pt's port. Pt having mri tom. And will leave it in for the MRI . Pt when he comes Thursday to get radiation- I will take port out then. Wife understands and I will send my chart message to daughter

## 2019-07-27 NOTE — Progress Notes (Signed)
Pt here for follow up. Complains of worsening shortness of breath and sharp right sided rib pain which started Saturday night. Almost called EMS last night due to shortness of breath. Pain is worsened with movement and deep breaths.

## 2019-07-28 ENCOUNTER — Other Ambulatory Visit: Payer: Self-pay | Admitting: *Deleted

## 2019-07-28 ENCOUNTER — Other Ambulatory Visit: Payer: Self-pay

## 2019-07-28 ENCOUNTER — Ambulatory Visit
Admission: RE | Admit: 2019-07-28 | Discharge: 2019-07-28 | Disposition: A | Discharge status: Home / Self Care | Payer: Medicare Other | Source: Ambulatory Visit | Attending: Oncology | Admitting: Oncology

## 2019-07-28 ENCOUNTER — Other Ambulatory Visit: Payer: Self-pay | Admitting: Cardiovascular Disease

## 2019-07-28 ENCOUNTER — Ambulatory Visit
Admission: RE | Admit: 2019-07-28 | Discharge: 2019-07-28 | Disposition: A | Discharge status: Home / Self Care | Payer: Medicare Other | Source: Ambulatory Visit | Attending: Radiation Oncology | Admitting: Radiation Oncology

## 2019-07-28 DIAGNOSIS — N2889 Other specified disorders of kidney and ureter: Secondary | ICD-10-CM | POA: Diagnosis not present

## 2019-07-28 DIAGNOSIS — I700 Atherosclerosis of aorta: Secondary | ICD-10-CM | POA: Diagnosis not present

## 2019-07-28 DIAGNOSIS — Z510 Encounter for antineoplastic radiation therapy: Secondary | ICD-10-CM | POA: Diagnosis not present

## 2019-07-28 DIAGNOSIS — C7A1 Malignant poorly differentiated neuroendocrine tumors: Secondary | ICD-10-CM | POA: Diagnosis not present

## 2019-07-28 DIAGNOSIS — C7A8 Other malignant neuroendocrine tumors: Secondary | ICD-10-CM | POA: Diagnosis not present

## 2019-07-28 DIAGNOSIS — D3502 Benign neoplasm of left adrenal gland: Secondary | ICD-10-CM | POA: Diagnosis not present

## 2019-07-28 DIAGNOSIS — M47816 Spondylosis without myelopathy or radiculopathy, lumbar region: Secondary | ICD-10-CM | POA: Diagnosis not present

## 2019-07-28 DIAGNOSIS — I7409 Other arterial embolism and thrombosis of abdominal aorta: Secondary | ICD-10-CM | POA: Diagnosis not present

## 2019-07-28 MED ORDER — TRAMADOL HCL 50 MG PO TABS: 50.0000 mg | ORAL_TABLET | Freq: Four times a day (QID) | ORAL | 0 refills | Status: DC | PRN

## 2019-07-28 MED ORDER — GADOBUTROL 1 MMOL/ML IV SOLN
8.0000 mL | Freq: Once | INTRAVENOUS | Status: AC | PRN
  Administered 2019-07-28: 8 mL via INTRAVENOUS

## 2019-07-29 ENCOUNTER — Other Ambulatory Visit: Payer: Self-pay

## 2019-07-29 ENCOUNTER — Inpatient Hospital Stay: Payer: Medicare Other

## 2019-07-29 ENCOUNTER — Ambulatory Visit
Admission: RE | Admit: 2019-07-29 | Discharge: 2019-07-29 | Disposition: A | Discharge status: Home / Self Care | Payer: Medicare Other | Source: Ambulatory Visit | Attending: Radiation Oncology | Admitting: Radiation Oncology

## 2019-07-29 DIAGNOSIS — Z79899 Other long term (current) drug therapy: Secondary | ICD-10-CM | POA: Diagnosis not present

## 2019-07-29 DIAGNOSIS — Z5111 Encounter for antineoplastic chemotherapy: Secondary | ICD-10-CM | POA: Diagnosis not present

## 2019-07-29 DIAGNOSIS — R0609 Other forms of dyspnea: Secondary | ICD-10-CM | POA: Diagnosis not present

## 2019-07-29 DIAGNOSIS — G4730 Sleep apnea, unspecified: Secondary | ICD-10-CM | POA: Diagnosis not present

## 2019-07-29 DIAGNOSIS — I2510 Atherosclerotic heart disease of native coronary artery without angina pectoris: Secondary | ICD-10-CM | POA: Diagnosis not present

## 2019-07-29 DIAGNOSIS — C7A1 Malignant poorly differentiated neuroendocrine tumors: Secondary | ICD-10-CM | POA: Diagnosis not present

## 2019-07-29 DIAGNOSIS — R21 Rash and other nonspecific skin eruption: Secondary | ICD-10-CM | POA: Diagnosis not present

## 2019-07-29 DIAGNOSIS — Z9989 Dependence on other enabling machines and devices: Secondary | ICD-10-CM | POA: Diagnosis not present

## 2019-07-29 DIAGNOSIS — C641 Malignant neoplasm of right kidney, except renal pelvis: Secondary | ICD-10-CM | POA: Diagnosis not present

## 2019-07-29 DIAGNOSIS — B379 Candidiasis, unspecified: Secondary | ICD-10-CM | POA: Diagnosis not present

## 2019-07-29 DIAGNOSIS — I10 Essential (primary) hypertension: Secondary | ICD-10-CM | POA: Diagnosis not present

## 2019-07-29 DIAGNOSIS — Z7982 Long term (current) use of aspirin: Secondary | ICD-10-CM | POA: Diagnosis not present

## 2019-07-29 DIAGNOSIS — C3411 Malignant neoplasm of upper lobe, right bronchus or lung: Secondary | ICD-10-CM

## 2019-07-29 DIAGNOSIS — R5381 Other malaise: Secondary | ICD-10-CM | POA: Diagnosis not present

## 2019-07-29 DIAGNOSIS — Z7984 Long term (current) use of oral hypoglycemic drugs: Secondary | ICD-10-CM | POA: Diagnosis not present

## 2019-07-29 DIAGNOSIS — E785 Hyperlipidemia, unspecified: Secondary | ICD-10-CM | POA: Diagnosis not present

## 2019-07-29 DIAGNOSIS — R0781 Pleurodynia: Secondary | ICD-10-CM

## 2019-07-29 DIAGNOSIS — D6481 Anemia due to antineoplastic chemotherapy: Secondary | ICD-10-CM | POA: Diagnosis not present

## 2019-07-29 DIAGNOSIS — Z85820 Personal history of malignant melanoma of skin: Secondary | ICD-10-CM | POA: Diagnosis not present

## 2019-07-29 DIAGNOSIS — C7A8 Other malignant neuroendocrine tumors: Secondary | ICD-10-CM | POA: Diagnosis not present

## 2019-07-29 DIAGNOSIS — R5383 Other fatigue: Secondary | ICD-10-CM | POA: Diagnosis not present

## 2019-07-29 DIAGNOSIS — E119 Type 2 diabetes mellitus without complications: Secondary | ICD-10-CM | POA: Diagnosis not present

## 2019-07-29 DIAGNOSIS — J449 Chronic obstructive pulmonary disease, unspecified: Secondary | ICD-10-CM | POA: Diagnosis not present

## 2019-07-29 DIAGNOSIS — Z510 Encounter for antineoplastic radiation therapy: Secondary | ICD-10-CM | POA: Diagnosis not present

## 2019-07-29 DIAGNOSIS — T451X5A Adverse effect of antineoplastic and immunosuppressive drugs, initial encounter: Secondary | ICD-10-CM | POA: Diagnosis not present

## 2019-07-29 MED ORDER — KETOROLAC TROMETHAMINE 30 MG/ML IJ SOLN
30.0000 mg | Freq: Once | INTRAMUSCULAR | Status: AC
  Administered 2019-07-29: 30 mg via INTRAVENOUS
  Filled 2019-07-29: qty 1

## 2019-07-29 NOTE — Progress Notes (Unsigned)
Pt rating pain at 6and everytime he moves his right arm it pulls at ribs and  . His b/p 119/65 p-96 Gave toradol over 1 1/2 min. Flushed with saline and heparin and d/c port. Rechecked vitals 13 ;45 and he was 112/70 p-62 and he had no pain. I helped him to get to car with wife and spoke to her about his pain and pain med. I wanted him to have xray today . I will put in xray for him in case he feels worse tom. I also told wife and they are agreeable

## 2019-07-30 ENCOUNTER — Other Ambulatory Visit: Payer: Self-pay

## 2019-07-30 ENCOUNTER — Ambulatory Visit
Admission: RE | Admit: 2019-07-30 | Discharge: 2019-07-30 | Disposition: A | Discharge status: Home / Self Care | Payer: Medicare Other | Source: Ambulatory Visit | Attending: Oncology | Admitting: Oncology

## 2019-07-30 ENCOUNTER — Ambulatory Visit
Admission: RE | Admit: 2019-07-30 | Discharge: 2019-07-30 | Disposition: A | Discharge status: Home / Self Care | Payer: Medicare Other | Source: Ambulatory Visit | Attending: Radiation Oncology | Admitting: Radiation Oncology

## 2019-07-30 ENCOUNTER — Telehealth: Payer: Self-pay | Admitting: *Deleted

## 2019-07-30 DIAGNOSIS — R0781 Pleurodynia: Secondary | ICD-10-CM | POA: Insufficient documentation

## 2019-07-30 DIAGNOSIS — Z510 Encounter for antineoplastic radiation therapy: Secondary | ICD-10-CM | POA: Diagnosis not present

## 2019-07-30 DIAGNOSIS — C3411 Malignant neoplasm of upper lobe, right bronchus or lung: Secondary | ICD-10-CM | POA: Insufficient documentation

## 2019-07-30 DIAGNOSIS — C7A1 Malignant poorly differentiated neuroendocrine tumors: Secondary | ICD-10-CM | POA: Diagnosis not present

## 2019-07-30 NOTE — Progress Notes (Signed)
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 As a part of his staging work-up he underwent CT chest as well as CT neck. 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 CT neck was unremarkable however CT chest showed a right upper lobe nodule 2.1 x 1.7 cm in with minimal spiculation as well as a 0.5 cm right lower lobe nodule. No mediastinal or hilar adenopathy was noted. Patient was referred to [facility] for further work-up given his preference. He underwent a PET CT scan on 06/10/2019 which showed a hypermetabolic right upper lobe lung nodule measuring 2 cm. Equivocal right infrahilar hypermetabolism with an SUV of 3.1. 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 Large right kidney mass measuring 9.7 x 9 cm. Patient underwent CT-guided lung biopsy which was consistent with large cell neuroendocrine carcinoma.  Concurrent chemoradiation started on 07/20/2019  Interval history-patient reports having right sided chest wall/rib cage pain which started after he underwent MRI of his abdomen.  Reports that pain is intense and sharp.  He was having difficulty breathing on movement but the shortness of breath is now resolved.  Pain persisted and he  was almost going to the ER yesterday.  Also reports having white spots in his mouth which makes him difficult to swallow  ECOG PS- 1 Pain scale- 4 Opioid associated constipation- no  Review of systems- Review of Systems  Constitutional: Positive for malaise/fatigue.  HENT:       Thrush  Respiratory:       Right sided chest wall pain     Allergies  Allergen Reactions  . Codeine Nausea And Vomiting and Other (See Comments)    Pt states when he takes codeine, he throws up blood  . Niacin And Related Other (See Comments)    "made his body feel hot & he had a hard time breathing"     Past Medical History:  Diagnosis Date  . Anxiety   . COPD (chronic obstructive pulmonary disease) ([place])   . Coronary artery disease 2006   Acute coronary syndrome in March 2006.   . Diabetes mellitus without complication ([place])   . Dyslipidemia   . Hypertension 10/09/2010   EF 40-45%  . Lung cancer ([place])   . 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 Sleep apnea    uses cpap machine     Past Surgical History:  Procedure Laterality Date  . Arterial evalation      no evidence of ICA stenosis  . CARDIAC CATHETERIZATION    . CARDIAC SURGERY    . CORONARY ARTERY BYPASS GRAFT  2006   Post CABG surgery with a LIMA to the LAD, a vein to the diagonal ,,a vein to the the obtuse marginal and  vein to the PDA.  [name] CORONARY STENT PLACEMENT  2010  . EAR CYST EXCISION Right 10/05/2018   Procedure: Right Partial pinnectomy;  Surgeon: Izora Gala, MD;  Location: [place];  Service: ENT;  Laterality: Right;  . GRAFT(S) ANGIOGRAM  10/21/2011   Procedure: GRAFT(S) Cyril Loosen;  Surgeon: Leonie Man, MD;  Location: [facility] CATH LAB;  Service: Cardiovascular;;  . LEFT HEART CATHETERIZATION WITH CORONARY ANGIOGRAM N/A 10/21/2011   Procedure: LEFT HEART CATHETERIZATION WITH CORONARY ANGIOGRAM;  Surgeon: Leonie Man, MD;  Location: [facility] CATH LAB;  Service: Cardiovascular;  Laterality: N/A;  . LYMPH NODE BIOPSY Right  10/05/2018   Procedure: sentinel NODE BIOPSY with right partial modified neck dissection;  Surgeon: Izora Gala, MD;  Location: [place];  Service: ENT;  Laterality: Right;  . PORTA CATH INSERTION N/A 07/12/2019   Procedure: PORTA CATH INSERTION;  Surgeon: Algernon Huxley, MD;  Location: [place] CV LAB;  Service: Cardiovascular;  Laterality: N/A;    Social History   Socioeconomic History  . Marital status: Married    Spouse name: Not on file  . Number of children: Not on file  . Years of education: Not on file  . Highest education level: Not on file  Occupational History  . Not on file  Social Needs  . Financial resource strain: Not on file  . Food insecurity    Worry: Not on file    Inability: Not on file  . Transportation needs    Medical: Not on file    Non-medical: Not on file  Tobacco Use  . Smoking status: Current Every Day Smoker    Packs/day: 2.00    Years: 50.00    Pack years: 100.00    Types: Cigarettes  . Smokeless tobacco: Former Systems developer    Types: [place] date: 01/23/2013  Substance and Sexual Activity  . Alcohol use: No  . Drug use: Not Currently  . Sexual activity: Not Currently  Lifestyle  . Physical activity    Days per week: Not on file    Minutes per session: Not on file  . Stress: Not on file  Relationships  . Social Herbalist on phone: Not on file    Gets together: Not on file    Attends religious service: Not on file    Active member of club or organization: Not on file    Attends meetings of clubs or organizations: Not on file    Relationship status: Not on file  . Intimate partner violence    Fear of current or ex partner: Not on file    Emotionally abused: Not on file    Physically abused: Not on file    Forced sexual activity: Not on file  Other Topics Concern  . Not on file  Social History Narrative  . Not on file    Family History  Problem Relation Age of Onset  . Hypertension Mother   . Heart attack Mother   .  Heart attack Father 55     Current Outpatient Medications:  .  ALPRAZolam (XANAX) 0.25 MG tablet, Take 0.25 mg by mouth 3 (three) times daily. , Disp: , Rfl:  .  amLODipine (NORVASC) 10 MG tablet, Take 10 mg by mouth daily., Disp: , Rfl:  .  aspirin EC 81 MG tablet, Take 81 mg by mouth daily., Disp: , Rfl:  .  busPIRone (BUSPAR) 7.5 MG tablet, Take 1 tablet by mouth 2 (two) times daily.,  Disp: , Rfl:  .  dexamethasone (DECADRON) 4 MG tablet, Take 2 tablets (8 mg total) by mouth daily. Start the day after chemotherapy for 1 day., Disp: 30 tablet, Rfl: 1 .  ezetimibe (ZETIA) 10 MG tablet, TAKE 1 TABLET BY MOUTH EVERY DAY, Disp: 90 tablet, Rfl: 3 .  famotidine (PEPCID) 20 MG tablet, TAKE 1 TABLET (20 MG TOTAL) BY MOUTH DAILY. NEEDS APPOINTMENT FOR FUTURE REFILLS, Disp: 30 tablet, Rfl: 3 .  INCRUSE ELLIPTA 62.5 MCG/INH AEPB, Inhale 1 puff into the lungs daily. , Disp: , Rfl: 2 .  Ipratropium-Albuterol (COMBIVENT RESPIMAT) 20-100 MCG/ACT AERS respimat, Inhale 1 puff into the lungs every 6 (six) hours., Disp: , Rfl:  .  isosorbide mononitrate (IMDUR) 60 MG 24 hr tablet, TAKE 1 TABLET BY MOUTH EVERY DAY, Disp: 90 tablet, Rfl: 1 .  lidocaine-prilocaine (EMLA) cream, Apply to affected area once, Disp: 30 g, Rfl: 3 .  LORazepam (ATIVAN) 0.5 MG tablet, Take 1 tablet 30 minutes prior to each scan, Disp: 2 tablet, Rfl: 0 .  losartan-hydrochlorothiazide (HYZAAR) 100-12.5 MG tablet, Take 1 tablet by mouth daily., Disp: 30 tablet, Rfl: 10 .  metFORMIN (GLUCOPHAGE) 500 MG tablet, TAKE 1 TABLET (500 MG TOTAL) BY MOUTH 2 (TWO) TIMES DAILY WITH A MEAL., Disp: 180 tablet, Rfl: 0 .  nitroGLYCERIN (NITROSTAT) 0.4 MG SL tablet, Place 0.4 mg under the tongue every 5 (five) minutes as needed for chest pain., Disp: , Rfl:  .  ondansetron (ZOFRAN) 8 MG tablet, Take 1 tablet (8 mg total) by mouth 2 (two) times daily as needed for refractory nausea / vomiting. Start on day 3 after carboplatin chemo., Disp: 30 tablet, Rfl: 1  .  prochlorperazine (COMPAZINE) 10 MG tablet, Take 1 tablet (10 mg total) by mouth every 6 (six) hours as needed (Nausea or vomiting)., Disp: 30 tablet, Rfl: 1 .  ranolazine (RANEXA) 1000 MG SR tablet, TAKE 1 TABLET BY MOUTH TWICE A DAY, Disp: 60 tablet, Rfl: 11 .  atorvastatin (LIPITOR) 40 MG tablet, TAKE 1 TABLET (40 MG TOTAL) BY MOUTH AT BEDTIME. KEEP OCTOBER APPOINTMENT, Disp: 90 tablet, Rfl: 0 .  Baclofen 5 MG TABS, Take 5 mg by mouth 3 (three) times daily. If 5 mg tid is not working then can increase to 10 mg tid, Disp: 90 tablet, Rfl: 0 .  fluconazole (DIFLUCAN) 100 MG tablet, Take 1 tablet (100 mg total) by mouth daily. Take 2 tablets on day 1 and then 1 tablet a day until rx ends, Disp: 6 tablet, Rfl: 0 .  nystatin (MYCOSTATIN) 100000 UNIT/ML suspension, Take 5 mLs (500,000 Units total) by mouth 4 (four) times daily., Disp: 473 mL, Rfl: 0 .  traMADol (ULTRAM) 50 MG tablet, Take 1 tablet (50 mg total) by mouth every 6 (six) hours as needed., Disp: 30 tablet, Rfl: 0 No current facility-administered medications for this visit.   Facility-Administered Medications Ordered in Other Visits:  .  heparin lock flush 100 unit/mL, 500 Units, Intravenous, Once, Randa Evens C, MD .  sodium chloride flush (NS) 0.9 % injection 10 mL, 10 mL, Intravenous, PRN, Sindy Guadeloupe, MD  Physical exam:  Vitals:   07/27/19 1107  BP: 131/62  Pulse: (!) 102  Resp: 18  Temp: 98.6 F (37 C)  TempSrc: Tympanic   Physical Exam HENT:     Head: Normocephalic and atraumatic.     Mouth/Throat:     Comments: Diffuse thrush noted over tongue extending up to his lips Eyes:  Pupils: Pupils are equal, round, and reactive to light.  Neck:     Musculoskeletal: Normal range of motion.  Cardiovascular:     Rate and Rhythm: Normal rate and regular rhythm.     Heart sounds: Normal heart sounds.  Pulmonary:     Effort: Pulmonary effort is normal.     Breath sounds: Normal breath sounds.  Abdominal:     General:  Bowel sounds are normal.     Palpations: Abdomen is soft.  Skin:    General: Skin is warm and dry.  Neurological:     Mental Status: He is alert and oriented to person, place, and time.      CMP Latest Ref Rng & Units 07/27/2019  Glucose 70 - 99 mg/dL 178(H)  BUN 8 - 23 mg/dL 22  Creatinine 0.61 - 1.24 mg/dL 0.96  Sodium 135 - 145 mmol/L 134(L)  Potassium 3.5 - 5.1 mmol/L 4.6  Chloride 98 - 111 mmol/L 99  CO2 22 - 32 mmol/L 26  Calcium 8.9 - 10.3 mg/dL 8.9  Total Protein 6.5 - 8.1 g/dL 6.9  Total Bilirubin 0.3 - 1.2 mg/dL 1.7(H)  Alkaline Phos 38 - 126 U/L 61  AST 15 - 41 U/L 12(L)  ALT 0 - 44 U/L 13   CBC Latest Ref Rng & Units 07/27/2019  WBC 4.0 - 10.5 K/uL 3.8(L)  Hemoglobin 13.0 - 17.0 g/dL 11.7(L)  Hematocrit 39.0 - 52.0 % 35.4(L)  Platelets 150 - 400 K/uL 138(L)    No images are attached to the encounter.  Mr Jeri Cos Wo Contrast  Result Date: 07/20/2019 CLINICAL DATA:  68 year old male recently diagnosed with lung cancer. EXAM: MRI HEAD WITHOUT AND WITH CONTRAST TECHNIQUE: Multiplanar, multiecho pulse sequences of the brain and surrounding structures were obtained without and with intravenous contrast. CONTRAST:  32mL GADAVIST GADOBUTROL 1 MMOL/ML IV SOLN COMPARISON:  [facility] CT without contrast 02/22/2008. FINDINGS: Brain: No abnormal enhancement identified. No midline shift, mass effect, or evidence of intracranial mass lesion. No dural thickening. Cerebral volume is within normal limits for age. No restricted diffusion to suggest acute infarction. No ventriculomegaly, extra-axial collection or acute intracranial hemorrhage. Cervicomedullary junction and pituitary are within normal limits. Widely scattered small foci of nonspecific cerebral white matter T2 and FLAIR hyperintensity, moderate for age. No cortical encephalomalacia or definite chronic cerebral blood products. The deep gray nuclei, brainstem and cerebellum are within normal limits for age.  Vascular: Major intracranial vascular flow voids are preserved. The major dural venous sinuses are enhancing and appear to be patent. Skull and upper cervical spine: Negative visible cervical spine and spinal cord. Visualized bone marrow signal is within normal limits. Sinuses/Orbits: Postoperative changes to both globes. Right maxillary sinus mucosal thickening or small fluid level. Other: Mastoids are clear. Visible internal auditory structures appear normal. Scalp and face soft tissues appear negative. IMPRESSION: 1.  No metastatic disease or acute intracranial abnormality. 2. Moderate for age nonspecific cerebral white matter signal changes, most commonly due to chronic small vessel disease. Electronically Signed   By: Genevie Ann M.D.   On: 07/20/2019 11:01   Mr Abdomen W Wo Contrast  Result Date: 07/29/2019 CLINICAL DATA:  Right renal mass. Large cell neuroendocrine carcinoma of the lungs. EXAM: MRI ABDOMEN WITHOUT AND WITH CONTRAST TECHNIQUE: Multiplanar multisequence MR imaging of the abdomen was performed both before and after the administration of intravenous contrast. CONTRAST:  84mL GADAVIST GADOBUTROL 1 MMOL/ML IV SOLN COMPARISON:  Multiple exams, including PET-CT from 06/10/2019  FINDINGS: Lower chest: Unremarkable Hepatobiliary: Unremarkable where included. Pancreas:  Unremarkable Spleen:  Unremarkable where included. Adrenals/Urinary Tract: There is dropout of signal in the 2.7 by 2.8 cm left adrenal mass on out of phase images compatible with added, which correlates with the low-density lesion on noncontrast CT. A heterogeneous mass exophytic from the right kidney lower pole measures 10.9 by 9.4 by 9.2 cm (volume = 490 cm^3) on image 18/2. No internal fatty elements identified. Associated moderate restriction of diffusion. Extensive heterogeneous enhancement throughout the mass after contrast administration. I do not appreciate definite renal vein thrombosis. No well-defined retroperitoneal  adenopathy. The mass extends to the margins of the perirenal space and abuts the right psoas muscle. No definite psoas muscle invasion. Left mid kidney and right kidney upper pole Bosniak category 1 cysts. Stomach/Bowel: Unremarkable Vascular/Lymphatic: Aortoiliac atherosclerotic vascular disease. Small thrombosed saccular outpouching of the abdominal aorta at the 4 o'clock position on image 299/12, measuring about 1.0 cm in diameter. No pathologic adenopathy identified. Other:  No supplemental non-categorized findings. Musculoskeletal: Mild lumbar spondylosis and degenerative disc disease. No compelling findings of osseous metastatic disease. IMPRESSION: 1. A 10.9 cm right kidney lower pole heterogeneously enhancing mass is most compatible with renal cell carcinoma. No appreciable pathologic adenopathy or thrombosis of the right renal vein. No compelling findings of distant metastatic disease. 2. Left adrenal adenoma. 3. Aortic Atherosclerosis (ICD10-I70.0). 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 Electronically Signed   By: Van Clines M.D.   On: 07/29/2019 07:57   Ct Outside Films Chest  Result Date: 06/30/2019 This examination belongs to an outside facility and is stored here for comparison purposes only.  Contact the originating outside institution for any associated report or interpretation.  Ct Outside Films Head/face  Result Date: 06/30/2019 This examination belongs to an outside facility and is stored here for comparison purposes only.  Contact the originating outside institution for any associated report or interpretation.    Assessment and plan- Patient is a 68 y.o. male with newly diagnosed neuroendocrine carcinoma of the right lung stage I T1 cN0 M0.  Patient is s/p 1 cycle of carboplatin and etoposide chemotherapy and here for toxicity check after 1 cycle  1.  Right-sided chest wall/rib pain: This  started a few days after patient was trying to get out of his MRI brain.  Pain appears to be worse on movement and I suspect it is musculoskeletal.  I initially gave him prescription for hydrocodone but patient reports that he is allergic to codeine and I will therefore switch the prescription to tramadol..  If pain persists we will get rib x-rays to rule out fracture  2.  Rash: We will give him a prescription for fluconazole.  I will reassess his symptoms in 1 week's time.  He will also return to clinic on 08/10/2019 with CBC with differential, CMP for cycle 2-day 1 of carboplatin and etoposide   Visit Diagnosis 1. Rib pain   2. Thrush      Dr. Randa Evens, MD, MPH [facility] [phone] 07/30/2019 7:17 AM

## 2019-07-30 NOTE — Telephone Encounter (Signed)
Pt states he continues to have pain in his right side despite taking pain medications. Per Dr. Janese Banks, pt needs to go for xray today. Informed pt to go to the [facility] after his radiation today to get xray done. Pt verbalized understanding.

## 2019-08-02 ENCOUNTER — Encounter: Payer: Self-pay | Admitting: Oncology

## 2019-08-02 ENCOUNTER — Ambulatory Visit
Admission: RE | Admit: 2019-08-02 | Discharge: 2019-08-02 | Disposition: A | Discharge status: Home / Self Care | Payer: Medicare Other | Source: Ambulatory Visit | Attending: Radiation Oncology | Admitting: Radiation Oncology

## 2019-08-02 ENCOUNTER — Other Ambulatory Visit: Payer: Self-pay

## 2019-08-02 DIAGNOSIS — C7A1 Malignant poorly differentiated neuroendocrine tumors: Secondary | ICD-10-CM | POA: Diagnosis not present

## 2019-08-02 DIAGNOSIS — Z510 Encounter for antineoplastic radiation therapy: Secondary | ICD-10-CM | POA: Diagnosis not present

## 2019-08-02 NOTE — Progress Notes (Signed)
Called number in chart, no one said anything tried calling back

## 2019-08-03 ENCOUNTER — Other Ambulatory Visit: Payer: Self-pay | Admitting: Radiology

## 2019-08-03 ENCOUNTER — Other Ambulatory Visit: Payer: Self-pay

## 2019-08-03 ENCOUNTER — Encounter: Payer: Self-pay | Admitting: Urology

## 2019-08-03 ENCOUNTER — Ambulatory Visit (INDEPENDENT_AMBULATORY_CARE_PROVIDER_SITE_OTHER): Payer: Medicare Other | Admitting: Urology

## 2019-08-03 ENCOUNTER — Inpatient Hospital Stay (HOSPITAL_BASED_OUTPATIENT_CLINIC_OR_DEPARTMENT_OTHER): Payer: Medicare Other | Admitting: Oncology

## 2019-08-03 ENCOUNTER — Ambulatory Visit
Admission: RE | Admit: 2019-08-03 | Discharge: 2019-08-03 | Disposition: A | Discharge status: Home / Self Care | Payer: Medicare Other | Source: Ambulatory Visit | Attending: Radiation Oncology | Admitting: Radiation Oncology

## 2019-08-03 VITALS — BP 138/72 | HR 98 | Ht 68.0 in | Wt 173.0 lb

## 2019-08-03 VITALS — BP 112/63 | HR 99 | Temp 97.1°F | Resp 20 | Ht 68.0 in | Wt 173.8 lb

## 2019-08-03 DIAGNOSIS — K1230 Oral mucositis (ulcerative), unspecified: Secondary | ICD-10-CM

## 2019-08-03 DIAGNOSIS — Z79899 Other long term (current) drug therapy: Secondary | ICD-10-CM | POA: Diagnosis not present

## 2019-08-03 DIAGNOSIS — N2889 Other specified disorders of kidney and ureter: Secondary | ICD-10-CM

## 2019-08-03 DIAGNOSIS — J449 Chronic obstructive pulmonary disease, unspecified: Secondary | ICD-10-CM | POA: Diagnosis not present

## 2019-08-03 DIAGNOSIS — Z5111 Encounter for antineoplastic chemotherapy: Secondary | ICD-10-CM | POA: Diagnosis not present

## 2019-08-03 DIAGNOSIS — C3411 Malignant neoplasm of upper lobe, right bronchus or lung: Secondary | ICD-10-CM | POA: Diagnosis not present

## 2019-08-03 DIAGNOSIS — G4730 Sleep apnea, unspecified: Secondary | ICD-10-CM | POA: Diagnosis not present

## 2019-08-03 DIAGNOSIS — C641 Malignant neoplasm of right kidney, except renal pelvis: Secondary | ICD-10-CM | POA: Diagnosis not present

## 2019-08-03 DIAGNOSIS — I2510 Atherosclerotic heart disease of native coronary artery without angina pectoris: Secondary | ICD-10-CM | POA: Diagnosis not present

## 2019-08-03 DIAGNOSIS — E119 Type 2 diabetes mellitus without complications: Secondary | ICD-10-CM | POA: Diagnosis not present

## 2019-08-03 DIAGNOSIS — R5383 Other fatigue: Secondary | ICD-10-CM | POA: Diagnosis not present

## 2019-08-03 DIAGNOSIS — R0781 Pleurodynia: Secondary | ICD-10-CM | POA: Diagnosis not present

## 2019-08-03 DIAGNOSIS — Z9989 Dependence on other enabling machines and devices: Secondary | ICD-10-CM | POA: Diagnosis not present

## 2019-08-03 DIAGNOSIS — C7A1 Malignant poorly differentiated neuroendocrine tumors: Secondary | ICD-10-CM | POA: Diagnosis not present

## 2019-08-03 DIAGNOSIS — Z7982 Long term (current) use of aspirin: Secondary | ICD-10-CM | POA: Diagnosis not present

## 2019-08-03 DIAGNOSIS — Z7984 Long term (current) use of oral hypoglycemic drugs: Secondary | ICD-10-CM | POA: Diagnosis not present

## 2019-08-03 DIAGNOSIS — F17200 Nicotine dependence, unspecified, uncomplicated: Secondary | ICD-10-CM | POA: Diagnosis not present

## 2019-08-03 DIAGNOSIS — Z510 Encounter for antineoplastic radiation therapy: Secondary | ICD-10-CM | POA: Diagnosis not present

## 2019-08-03 DIAGNOSIS — R5381 Other malaise: Secondary | ICD-10-CM | POA: Diagnosis not present

## 2019-08-03 DIAGNOSIS — D6481 Anemia due to antineoplastic chemotherapy: Secondary | ICD-10-CM | POA: Diagnosis not present

## 2019-08-03 DIAGNOSIS — Z85820 Personal history of malignant melanoma of skin: Secondary | ICD-10-CM | POA: Diagnosis not present

## 2019-08-03 DIAGNOSIS — T451X5A Adverse effect of antineoplastic and immunosuppressive drugs, initial encounter: Secondary | ICD-10-CM | POA: Diagnosis not present

## 2019-08-03 DIAGNOSIS — I10 Essential (primary) hypertension: Secondary | ICD-10-CM | POA: Diagnosis not present

## 2019-08-03 DIAGNOSIS — B379 Candidiasis, unspecified: Secondary | ICD-10-CM | POA: Diagnosis not present

## 2019-08-03 DIAGNOSIS — R21 Rash and other nonspecific skin eruption: Secondary | ICD-10-CM | POA: Diagnosis not present

## 2019-08-03 DIAGNOSIS — R0609 Other forms of dyspnea: Secondary | ICD-10-CM | POA: Diagnosis not present

## 2019-08-03 DIAGNOSIS — C7A8 Other malignant neuroendocrine tumors: Secondary | ICD-10-CM | POA: Diagnosis not present

## 2019-08-03 DIAGNOSIS — E785 Hyperlipidemia, unspecified: Secondary | ICD-10-CM | POA: Diagnosis not present

## 2019-08-03 MED ORDER — MAGIC MOUTHWASH W/LIDOCAINE: 5.0000 mL | Freq: Four times a day (QID) | ORAL | 0 refills | Status: DC | PRN

## 2019-08-03 NOTE — Progress Notes (Signed)
Pt states that lips hurts bad especially when pt trying to eat. He had brunswick stew and it tasted good and burned his lip a lot. He is hurting in his right lower abd. But not bad. He has clear sputum production cough

## 2019-08-03 NOTE — Progress Notes (Signed)
08/03/2019 12:47 PM   [place] June 14, 1951 [id number]  Referring provider: [name], MD [place],  [place] [address]  Chief Complaint  Patient presents with  . Renal Mass    HPI: 68 year old male with multiple medical comorbidities including a large right renal mass who presents today for further evaluation of this.  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 Donella Stade Dr. Janese Banks.    As part of his staging work-up for this, he underwent a PET scan which showed an incidental large right renal mass.  Most recently for further staging, he underwent an MRI of the abdomen with and without contrast revealing a 10.9 cm right lower pole renal mass which is a heterogeneously enhancing without pathologic adenopathy or renal vein involvement.  He denies any flank pain or gross hematuria.  No family history of renal cell carcinoma.  He has been tolerating radiation and chemo fairly well.  He believes that he will continue treatment to the end of November.  In terms of functional status, he lives alone but is able to make small meals and do some light housework and ambulate.  Most of the cooking and cleaning is performed by his family who stops by often.  He continues to smoke.  He does notably have an episode of right anterior rib pain this week which he reports is positional.  It improves with pain pills.  KUB was negative for any fracture.  PMH: Past Medical History:  Diagnosis Date  . Anxiety   . COPD (chronic obstructive pulmonary disease) ([place])   . Coronary artery disease 2006   Acute coronary syndrome in March 2006.   .  Diabetes mellitus without complication ([place])   . Dyslipidemia   . Hypertension 10/09/2010   EF 40-45%  . Lung cancer ([place])   . Melanoma in situ of ear, right ([place]) 09/2018  . Sleep apnea    uses cpap machine    Surgical History: Past Surgical History:  Procedure Laterality Date  . Arterial evalation      no evidence of ICA stenosis  . CARDIAC CATHETERIZATION    . CARDIAC SURGERY    . CORONARY ARTERY BYPASS GRAFT  2006   Post CABG surgery with a LIMA to the LAD, a vein to the diagonal ,,a vein to the the obtuse marginal and vein to the PDA.  [name] CORONARY STENT PLACEMENT  2010  . EAR CYST EXCISION Right 10/05/2018   Procedure: Right Partial pinnectomy;  Surgeon: Izora Gala, MD;  Location: [facility];  Service: ENT;  Laterality: Right;  . GRAFT(S) ANGIOGRAM  10/21/2011   Procedure: GRAFT(S) Cyril Loosen;  Surgeon: Leonie Man, MD;  Location: [facility] CATH LAB;  Service: Cardiovascular;;  . LEFT HEART CATHETERIZATION WITH CORONARY ANGIOGRAM N/A 10/21/2011   Procedure: LEFT HEART CATHETERIZATION WITH CORONARY ANGIOGRAM;  Surgeon: Leonie Man, MD;  Location: [facility] CATH LAB;  Service: Cardiovascular;  Laterality: N/A;  . LYMPH NODE BIOPSY Right 10/05/2018   Procedure: sentinel NODE BIOPSY with right partial modified neck dissection;  Surgeon: Izora Gala, MD;  Location: [facility];  Service: ENT;  Laterality: Right;  . PORTA CATH INSERTION N/A 07/12/2019   Procedure:  PORTA CATH INSERTION;  Surgeon: Algernon Huxley, MD;  Location: [facility] CV LAB;  Service: Cardiovascular;  Laterality: N/A;    Home Medications:  Allergies as of 08/03/2019      Reactions   Codeine Nausea And Vomiting, Other (See Comments)   Pt states when he takes codeine, he throws up blood   Niacin And Related Other (See Comments)   "made his body feel hot & he had a hard time breathing"      Medication List       Accurate as of August 03, 2019 12:47 PM. If you have any questions, ask your nurse or doctor.        ALPRAZolam  0.25 MG tablet Commonly known as: XANAX Take 0.25 mg by mouth 3 (three) times daily.   amLODipine 10 MG tablet Commonly known as: NORVASC Take 10 mg by mouth daily.   aspirin EC 81 MG tablet Take 81 mg by mouth daily.   atorvastatin 40 MG tablet Commonly known as: LIPITOR TAKE 1 TABLET (40 MG TOTAL) BY MOUTH AT BEDTIME. KEEP OCTOBER APPOINTMENT   Baclofen 5 MG Tabs Take 5 mg by mouth 3 (three) times daily. If 5 mg tid is not working then can increase to 10 mg tid   busPIRone 7.5 MG tablet Commonly known as: BUSPAR Take 1 tablet by mouth 2 (two) times daily.   Combivent Respimat 20-100 MCG/ACT Aers respimat Generic drug: Ipratropium-Albuterol Inhale 1 puff into the lungs every 6 (six) hours.   dexamethasone 4 MG tablet Commonly known as: DECADRON Take 2 tablets (8 mg total) by mouth daily. Start the day after chemotherapy for 1 day.   ezetimibe 10 MG tablet Commonly known as: ZETIA TAKE 1 TABLET BY MOUTH EVERY DAY   famotidine 20 MG tablet Commonly known as: PEPCID TAKE 1 TABLET (20 MG TOTAL) BY MOUTH DAILY. NEEDS APPOINTMENT FOR FUTURE REFILLS   fluconazole 100 MG tablet Commonly known as: DIFLUCAN Take 1 tablet (100 mg total) by mouth daily. Take 2 tablets on day 1 and then 1 tablet a day until rx ends   Incruse Ellipta 62.5 MCG/INH Aepb Generic drug: umeclidinium bromide Inhale 1 puff into the lungs daily.   isosorbide mononitrate 60 MG 24 hr tablet Commonly known as: IMDUR TAKE 1 TABLET BY MOUTH EVERY DAY   lidocaine-prilocaine cream Commonly known as: EMLA Apply to affected area once   LORazepam 0.5 MG tablet Commonly known as: ATIVAN Take 1 tablet 30 minutes prior to each scan   losartan-hydrochlorothiazide 100-12.5 MG tablet Commonly known as: HYZAAR Take 1 tablet by mouth daily.   metFORMIN 500 MG tablet Commonly known as: GLUCOPHAGE TAKE 1 TABLET (500 MG TOTAL) BY MOUTH 2 (TWO) TIMES DAILY WITH A MEAL.   nitroGLYCERIN 0.4 MG SL tablet  Commonly known as: NITROSTAT Place 0.4 mg under the tongue every 5 (five) minutes as needed for chest pain.   nystatin 100000 UNIT/ML suspension Commonly known as: MYCOSTATIN Take 5 mLs (500,000 Units total) by mouth 4 (four) times daily.   ondansetron 8 MG tablet Commonly known as: Zofran Take 1 tablet (8 mg total) by mouth 2 (two) times daily as needed for refractory nausea / vomiting. Start on day 3 after carboplatin chemo.   prochlorperazine 10 MG tablet Commonly known as: COMPAZINE Take 1 tablet (10 mg total) by mouth every 6 (six) hours as needed (Nausea or vomiting).   ranolazine 1000 MG SR tablet Commonly known as: RANEXA TAKE 1 TABLET BY MOUTH TWICE A  DAY   traMADol 50 MG tablet Commonly known as: ULTRAM Take 1 tablet (50 mg total) by mouth every 6 (six) hours as needed.       Allergies:  Allergies  Allergen Reactions  . Codeine Nausea And Vomiting and Other (See Comments)    Pt states when he takes codeine, he throws up blood  . Niacin And Related Other (See Comments)    "made his body feel hot & he had a hard time breathing"    Family History: Family History  Problem Relation Age of Onset  . Hypertension Mother   . Heart attack Mother   . Heart attack Father 31    Social History:  reports that he has been smoking cigarettes. He has a 100.00 pack-year smoking history. He quit smokeless tobacco use about 6 years ago.  His smokeless tobacco use included chew. He reports previous drug use. He reports that he does not drink alcohol.  ROS: UROLOGY Frequent Urination?: No Hard to postpone urination?: No Burning/pain with urination?: No Get up at night to urinate?: No Leakage of urine?: No Urine stream starts and stops?: No Trouble starting stream?: No Do you have to strain to urinate?: No Blood in urine?: No Urinary tract infection?: No Sexually transmitted disease?: No Injury to kidneys or bladder?: No Painful intercourse?: No Weak stream?: No  Erection problems?: No Penile pain?: No  Gastrointestinal Nausea?: No Vomiting?: No Indigestion/heartburn?: No Diarrhea?: No Constipation?: No  Constitutional Fever: No Night sweats?: No Weight loss?: No Fatigue?: No  Skin Skin rash/lesions?: No Itching?: No  Eyes Blurred vision?: No Double vision?: No  Ears/Nose/Throat Sore throat?: No Sinus problems?: No  Hematologic/Lymphatic Swollen glands?: No Easy bruising?: No  Cardiovascular Leg swelling?: No Chest pain?: No  Respiratory Cough?: No Shortness of breath?: No  Endocrine Excessive thirst?: No  Musculoskeletal Back pain?: No Joint pain?: No  Neurological Headaches?: No Dizziness?: No  Psychologic Depression?: No Anxiety?: No  Physical Exam: BP 138/72   Pulse 98   Ht 5\' 8"  (1.727 m)   Wt 173 lb (78.5 kg)   BMI 26.30 kg/m   Constitutional:  Alert and oriented, No acute distress.  Accompanied by family member today.  Smells like cigarettes. HEENT: [place] AT, moist mucus membranes.  Trachea midline, no masses. Cardiovascular: No clubbing, cyanosis, or edema. Respiratory: Slightly increased work of breathing with chronic cough. GI: Abdomen is soft, nontender, nondistended, no abdominal masses Skin: No rashes, bruises or suspicious lesions. Neurologic: Grossly intact, no focal deficits, moving all 4 extremities. Psychiatric: Normal mood and affect.  Laboratory Data: Lab Results  Component Value Date   WBC 3.8 (L) 07/27/2019   HGB 11.7 (L) 07/27/2019   HCT 35.4 (L) 07/27/2019   MCV 88.1 07/27/2019   PLT 138 (L) 07/27/2019    Lab Results  Component Value Date   CREATININE 0.96 07/27/2019    Lab Results  Component Value Date   HGBA1C 6.1 (H) 09/29/2018    Pertinent Imaging: CLINICAL DATA:  Right renal mass. Large cell neuroendocrine carcinoma of the lungs.  EXAM: MRI ABDOMEN WITHOUT AND WITH CONTRAST  TECHNIQUE: Multiplanar multisequence MR imaging of the abdomen was  performed both before and after the administration of intravenous contrast.  CONTRAST:  79mL GADAVIST GADOBUTROL 1 MMOL/ML IV SOLN  COMPARISON:  Multiple exams, including PET-CT from 06/10/2019  FINDINGS: Lower chest: Unremarkable  Hepatobiliary: Unremarkable where included.  Pancreas:  Unremarkable  Spleen:  Unremarkable where included.  Adrenals/Urinary Tract: There is dropout of signal in  the 2.7 by 2.8 cm left adrenal mass on out of phase images compatible with added, which correlates with the low-density lesion on noncontrast CT.  A heterogeneous mass exophytic from the right kidney lower pole measures 10.9 by 9.4 by 9.2 cm (volume = 490 cm^3) on image 18/2. No internal fatty elements identified. Associated moderate restriction of diffusion. Extensive heterogeneous enhancement throughout the mass after contrast administration. I do not appreciate definite renal vein thrombosis. No well-defined retroperitoneal adenopathy. The mass extends to the margins of the perirenal space and abuts the right psoas muscle. No definite psoas muscle invasion.  Left mid kidney and right kidney upper pole Bosniak category 1 cysts.  Stomach/Bowel: Unremarkable  Vascular/Lymphatic: Aortoiliac atherosclerotic vascular disease. Small thrombosed saccular outpouching of the abdominal aorta at the 4 o'clock position on image 299/12, measuring about 1.0 cm in diameter. No pathologic adenopathy identified.  Other:  No supplemental non-categorized findings.  Musculoskeletal: Mild lumbar spondylosis and degenerative disc disease. No compelling findings of osseous metastatic disease.  IMPRESSION: 1. A 10.9 cm right kidney lower pole heterogeneously enhancing mass is most compatible with renal cell carcinoma. No appreciable pathologic adenopathy or thrombosis of the right renal vein. No compelling findings of distant metastatic disease. 2. Left adrenal adenoma. 3. Aortic  Atherosclerosis (ICD10-I70.0). 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   Electronically Signed   By: Van Clines M.D.   On: 07/29/2019 07:57  MRI was personally reviewed today.  Agree with radiologic interpretation, no evidence of renal vein involvement or obvious metastatic disease.  This is also personally compared to his PET scan from 06/10/2019.  No previous cross-sectional imaging prior to the above available for review.  Assessment & Plan:    1. Renal mass Large 10 cm right renal mass, appears to be organ confined and presumably renal cell carcinoma.  Given his history of multiple malignancies, I would strongly recommend biopsy of the renal mass to ensure that we are dealing with renal cell carcinoma rather than some sort of neuroendocrine or other tumor.  This in fact is renal cell carcinoma, would recommend nephrectomy after chemoradiation is completed, likely sometime in early 2021.  I do feel that this tumor is likely amenable to either robotic or hand-assisted minimally invasive approach.  Normal baseline renal function with no previous surgeries.  He does have multiple medical comorbidities and will need both pulmonary and cardiac clearance prior to surgery.  He is at high risk for postoperative complications.  Alternatives including continued surveillance was discussed but not strongly recommended.  -renal mass biopsy  2. Smoking Strongly recommend cessation of cigarette smoking especially prior to surgery   Return in about 6 weeks (around 09/14/2019) for discuss nephrectomy (will schedule rneal mass biopsy).  Hollice Espy, MD  [facility] [address], [place], [place] [address] [phone]  I spent 45 min with this patient of which greater than 50% was spent in counseling and coordination of care with the patient.  Case was  previously extensively discussed with Dr. Janese Banks in terms of timing for treatment of each of his malignancies.

## 2019-08-04 ENCOUNTER — Other Ambulatory Visit: Payer: Self-pay

## 2019-08-04 ENCOUNTER — Ambulatory Visit
Admission: RE | Admit: 2019-08-04 | Discharge: 2019-08-04 | Disposition: A | Discharge status: Home / Self Care | Payer: Medicare Other | Source: Ambulatory Visit | Attending: Radiation Oncology | Admitting: Radiation Oncology

## 2019-08-04 DIAGNOSIS — C7A1 Malignant poorly differentiated neuroendocrine tumors: Secondary | ICD-10-CM | POA: Diagnosis not present

## 2019-08-04 DIAGNOSIS — Z510 Encounter for antineoplastic radiation therapy: Secondary | ICD-10-CM | POA: Diagnosis not present

## 2019-08-04 NOTE — Progress Notes (Signed)
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 As a part of his staging work-up he underwent CT chest as well as CT neck. 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 CT neck was unremarkable however CT chest showed a right upper lobe nodule 2.1 x 1.7 cm in with minimal spiculation as well as a 0.5 cm right lower lobe nodule. No mediastinal or hilar adenopathy was noted. Patient was referred to [facility] for further work-up given his preference. He underwent a PET CT scan on 06/10/2019 which showed a hypermetabolic right upper lobe lung nodule measuring 2 cm. Equivocal right infrahilar hypermetabolism with an SUV of 3.1. 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 Large right kidney mass measuring 9.7 x 9 cm. Patient underwent CT-guided lung biopsy which was consistent with large cell neuroendocrine carcinoma.  Concurrent chemoradiation started on 07/20/2019  Interval history-oral thrush has resolved.  He still has ulceration over his lower lip which makes it difficult for him to swallow.  Reports occasional burning sensation in his mouth.  Right chest wall pain has resolved.  ECOG PS- 1 Pain scale- 0 Opioid associated constipation-  no  Review of systems- Review of Systems  Constitutional: Positive for malaise/fatigue. Negative for chills, fever and weight loss.  HENT: Negative for congestion, ear discharge and nosebleeds.   Eyes: Negative for blurred vision.  Respiratory: Negative for cough, hemoptysis, sputum production, shortness of breath and wheezing.   Cardiovascular: Negative for chest pain, palpitations, orthopnea and claudication.  Gastrointestinal: Negative for abdominal pain, blood in stool, constipation, diarrhea, heartburn, melena, nausea and vomiting.  Genitourinary: Negative for dysuria, flank pain, frequency, hematuria and urgency.  Musculoskeletal: Negative for back pain, joint pain and myalgias.  Skin: Negative for rash.  Neurological: Negative for dizziness, tingling, focal weakness, seizures, weakness and headaches.  Endo/Heme/Allergies: Does not bruise/bleed easily.  Psychiatric/Behavioral: Negative for depression and suicidal ideas. The patient does not have insomnia.      Allergies  Allergen Reactions   Codeine Nausea And Vomiting and Other (See Comments)    Pt states when he takes codeine, he throws up blood   Niacin And Related Other (See Comments)    "made his body feel hot & he had a hard time breathing"     Past Medical History:  Diagnosis Date   Anxiety    COPD (chronic obstructive pulmonary disease) ([place])    Coronary artery disease 2006   Acute coronary syndrome in March 2006.    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   CORONARY STENT PLACEMENT  2010   EAR CYST EXCISION Right 10/05/2018   Procedure: Right Partial pinnectomy;  Surgeon: Izora Gala, MD;  Location: [place];  Service: ENT;  Laterality: Right;   GRAFT(S) ANGIOGRAM  10/21/2011   Procedure: GRAFT(S) Cyril Loosen;  Surgeon: Leonie Man, MD;  Location: [facility] CATH LAB;  Service: Cardiovascular;;   LEFT HEART CATHETERIZATION WITH CORONARY ANGIOGRAM N/A 10/21/2011   Procedure: LEFT HEART CATHETERIZATION WITH CORONARY ANGIOGRAM;  Surgeon: Leonie Man, MD;  Location: [facility] CATH LAB;  Service: Cardiovascular;  Laterality: N/A;   LYMPH NODE BIOPSY Right 10/05/2018   Procedure: sentinel NODE BIOPSY with right partial modified neck dissection;  Surgeon: Izora Gala, MD;  Location: [place];  Service: ENT;  Laterality: Right;   PORTA CATH INSERTION N/A 07/12/2019   Procedure: PORTA CATH INSERTION;  Surgeon: Algernon Huxley, MD;  Location: [place] CV LAB;  Service: Cardiovascular;  Laterality: N/A;    Social History   Socioeconomic History   Marital status: Married    Spouse name: Not on file   Number of children: Not on file   Years of education: Not on file   Highest education level: Not on file  Occupational History   Not on file  Social Needs   Financial resource strain: Not on file   Food insecurity    Worry: Not on file    Inability: Not on file   Transportation needs    Medical: Not on file    Non-medical: Not on file  Tobacco Use   Smoking status: Current Every Day Smoker    Packs/day: 2.00    Years: 50.00    Pack years: 100.00    Types: Cigarettes   Smokeless tobacco: Former Systems developer    Types: Chew    Quit date: 01/23/2013  Substance and Sexual Activity   Alcohol use: No   Drug use: Not Currently   Sexual activity: Not Currently  Lifestyle   Physical activity    Days per week: Not on file    Minutes per session: Not on file   Stress: Not on file  Relationships   Social  connections    Talks on phone: Not on file    Gets together: Not on file    Attends religious service: Not on file    Active member of club or organization: Not on file    Attends meetings of clubs or organizations: Not on file    Relationship status: Not on file   Intimate partner violence    Fear of current or ex partner: Not on file    Emotionally abused: Not on file    Physically abused: Not on file    Forced sexual activity: Not on file  Other Topics Concern   Not on file  Social History Narrative   Not on file    Family History  Problem Relation Age of Onset   Hypertension Mother    Heart attack Mother    Heart attack Father 43     Current Outpatient Medications:    ALPRAZolam (XANAX) 0.25 MG  tablet, Take 0.25 mg by mouth 3 (three) times daily. , Disp: , Rfl:    amLODipine (NORVASC) 10 MG tablet, Take 10 mg by mouth daily., Disp: , Rfl:    aspirin EC 81 MG tablet, Take 81 mg by mouth daily., Disp: , Rfl:    atorvastatin (LIPITOR) 40 MG tablet, TAKE 1 TABLET (40 MG TOTAL) BY MOUTH AT BEDTIME. KEEP OCTOBER APPOINTMENT, Disp: 90 tablet, Rfl: 0   busPIRone (BUSPAR) 7.5 MG tablet, Take 1 tablet by mouth 2 (two) times daily., Disp: , Rfl:    dexamethasone (DECADRON) 4 MG tablet, Take 2 tablets (8 mg total) by mouth daily. Start the day after chemotherapy for 1 day., Disp: 30 tablet, Rfl: 1   ezetimibe (ZETIA) 10 MG tablet, TAKE 1 TABLET BY MOUTH EVERY DAY, Disp: 90 tablet, Rfl: 3   famotidine (PEPCID) 20 MG tablet, TAKE 1 TABLET (20 MG TOTAL) BY MOUTH DAILY. NEEDS APPOINTMENT FOR FUTURE REFILLS, Disp: 30 tablet, Rfl: 3   fluconazole (DIFLUCAN) 100 MG tablet, Take 1 tablet (100 mg total) by mouth daily. Take 2 tablets on day 1 and then 1 tablet a day until rx ends, Disp: 6 tablet, Rfl: 0   INCRUSE ELLIPTA 62.5 MCG/INH AEPB, Inhale 1 puff into the lungs daily. , Disp: , Rfl: 2   Ipratropium-Albuterol (COMBIVENT RESPIMAT) 20-100 MCG/ACT AERS respimat, Inhale 1 puff  into the lungs every 6 (six) hours., Disp: , Rfl:    isosorbide mononitrate (IMDUR) 60 MG 24 hr tablet, TAKE 1 TABLET BY MOUTH EVERY DAY, Disp: 90 tablet, Rfl: 1   lidocaine-prilocaine (EMLA) cream, Apply to affected area once, Disp: 30 g, Rfl: 3   LORazepam (ATIVAN) 0.5 MG tablet, Take 1 tablet 30 minutes prior to each scan, Disp: 2 tablet, Rfl: 0   losartan-hydrochlorothiazide (HYZAAR) 100-12.5 MG tablet, Take 1 tablet by mouth daily., Disp: 30 tablet, Rfl: 10   nitroGLYCERIN (NITROSTAT) 0.4 MG SL tablet, Place 0.4 mg under the tongue every 5 (five) minutes as needed for chest pain., Disp: , Rfl:    nystatin (MYCOSTATIN) 100000 UNIT/ML suspension, Take 5 mLs (500,000 Units total) by mouth 4 (four) times daily., Disp: 473 mL, Rfl: 0   ondansetron (ZOFRAN) 8 MG tablet, Take 1 tablet (8 mg total) by mouth 2 (two) times daily as needed for refractory nausea / vomiting. Start on day 3 after carboplatin chemo., Disp: 30 tablet, Rfl: 1   prochlorperazine (COMPAZINE) 10 MG tablet, Take 1 tablet (10 mg total) by mouth every 6 (six) hours as needed (Nausea or vomiting)., Disp: 30 tablet, Rfl: 1   traMADol (ULTRAM) 50 MG tablet, Take 1 tablet (50 mg total) by mouth every 6 (six) hours as needed., Disp: 30 tablet, Rfl: 0   Baclofen 5 MG TABS, Take 5 mg by mouth 3 (three) times daily. If 5 mg tid is not working then can increase to 10 mg tid, Disp: 90 tablet, Rfl: 0   magic mouthwash w/lidocaine SOLN, Take 5 mLs by mouth 4 (four) times daily as needed for mouth pain., Disp: 460 mL, Rfl: 0   metFORMIN (GLUCOPHAGE) 500 MG tablet, TAKE 1 TABLET (500 MG TOTAL) BY MOUTH 2 (TWO) TIMES DAILY WITH A MEAL., Disp: 180 tablet, Rfl: 0   ranolazine (RANEXA) 1000 MG SR tablet, TAKE 1 TABLET BY MOUTH TWICE A DAY, Disp: 60 tablet, Rfl: 11 No current facility-administered medications for this visit.   Facility-Administered Medications Ordered in Other Visits:    heparin lock flush 100 unit/mL, 500 Units,  Intravenous, Once, Sindy Guadeloupe, MD   sodium chloride flush (NS) 0.9 % injection 10 mL, 10 mL, Intravenous, PRN, Sindy Guadeloupe, MD  Physical exam:  Vitals:   08/03/19 1319  BP: 112/63  Pulse: 99  Resp: 20  Temp: (!) 97.1 F (36.2 C)  TempSrc: Tympanic  Weight: 173 lb 12.8 oz (78.8 kg)  Height: 5\' 8"  (1.727 m)   Physical Exam HENT:     Head: Normocephalic and atraumatic.     Mouth/Throat:     Comments: Lower lip shows scattered areas of small ulcerations.  Ritta Slot has resolved Eyes:     Pupils: Pupils are equal, round, and reactive to light.  Neck:     Musculoskeletal: Normal range of motion.  Cardiovascular:     Rate and Rhythm: Normal rate and regular rhythm.     Heart sounds: Normal heart sounds.  Pulmonary:     Effort: Pulmonary effort is normal.     Breath sounds: Normal breath sounds.  Abdominal:     General: Bowel sounds are normal.     Palpations: Abdomen is soft.  Skin:    General: Skin is warm and dry.  Neurological:     Mental Status: He is alert and oriented to person, place, and time.      CMP Latest Ref Rng & Units 07/27/2019  Glucose 70 - 99 mg/dL 178(H)  BUN 8 - 23 mg/dL 22  Creatinine 0.61 - 1.24 mg/dL 0.96  Sodium 135 - 145 mmol/L 134(L)  Potassium 3.5 - 5.1 mmol/L 4.6  Chloride 98 - 111 mmol/L 99  CO2 22 - 32 mmol/L 26  Calcium 8.9 - 10.3 mg/dL 8.9  Total Protein 6.5 - 8.1 g/dL 6.9  Total Bilirubin 0.3 - 1.2 mg/dL 1.7(H)  Alkaline Phos 38 - 126 U/L 61  AST 15 - 41 U/L 12(L)  ALT 0 - 44 U/L 13   CBC Latest Ref Rng & Units 07/27/2019  WBC 4.0 - 10.5 K/uL 3.8(L)  Hemoglobin 13.0 - 17.0 g/dL 11.7(L)  Hematocrit 39.0 - 52.0 % 35.4(L)  Platelets 150 - 400 K/uL 138(L)    No images are attached to the encounter.  Dg Ribs Unilateral Right  Result Date: 07/30/2019 CLINICAL DATA:  Rib pain, history of right lung cancer EXAM: RIGHT RIBS - 2 VIEW COMPARISON:  Chest radiograph dated 06/23/2019 FINDINGS: No fracture or other bone lesions are  seen involving the ribs. Median sternotomy wires are noted. A left internal jugular central venous port catheter has tip overlying the left brachiocephalic vein. IMPRESSION: Negative for fracture or other acute bone lesion. No pneumothorax or effusion. Electronically Signed   By: Zerita Boers M.D.   On: 07/30/2019 17:12   Mr Jeri Cos FX Contrast  Result Date: 07/20/2019 CLINICAL DATA:  68 year old male recently diagnosed with lung cancer. EXAM: MRI HEAD WITHOUT AND WITH CONTRAST TECHNIQUE: Multiplanar, multiecho pulse sequences of the brain and surrounding structures were obtained without and with intravenous contrast. CONTRAST:  59mL GADAVIST GADOBUTROL 1 MMOL/ML IV SOLN COMPARISON:  [place] [facility] head CT without contrast 02/22/2008. FINDINGS: Brain: No abnormal enhancement identified. No midline shift, mass effect, or evidence of intracranial mass lesion. No dural thickening. Cerebral volume is within normal limits for age. No restricted diffusion to suggest acute infarction. No ventriculomegaly, extra-axial collection or acute intracranial hemorrhage. Cervicomedullary junction and pituitary are within normal limits. Widely scattered small foci of nonspecific cerebral white matter T2 and FLAIR hyperintensity, moderate for age. No cortical encephalomalacia or definite chronic  cerebral blood products. The deep gray nuclei, brainstem and cerebellum are within normal limits for age. Vascular: Major intracranial vascular flow voids are preserved. The major dural venous sinuses are enhancing and appear to be patent. Skull and upper cervical spine: Negative visible cervical spine and spinal cord. Visualized bone marrow signal is within normal limits. Sinuses/Orbits: Postoperative changes to both globes. Right maxillary sinus mucosal thickening or small fluid level. Other: Mastoids are clear. Visible internal auditory structures appear normal. Scalp and face soft tissues appear negative. IMPRESSION: 1.  No  metastatic disease or acute intracranial abnormality. 2. Moderate for age nonspecific cerebral white matter signal changes, most commonly due to chronic small vessel disease. Electronically Signed   By: Genevie Ann M.D.   On: 07/20/2019 11:01   Mr Abdomen W Wo Contrast  Result Date: 07/29/2019 CLINICAL DATA:  Right renal mass. Large cell neuroendocrine carcinoma of the lungs. EXAM: MRI ABDOMEN WITHOUT AND WITH CONTRAST TECHNIQUE: Multiplanar multisequence MR imaging of the abdomen was performed both before and after the administration of intravenous contrast. CONTRAST:  25mL GADAVIST GADOBUTROL 1 MMOL/ML IV SOLN COMPARISON:  Multiple exams, including PET-CT from 06/10/2019 FINDINGS: Lower chest: Unremarkable Hepatobiliary: Unremarkable where included. Pancreas:  Unremarkable Spleen:  Unremarkable where included. Adrenals/Urinary Tract: There is dropout of signal in the 2.7 by 2.8 cm left adrenal mass on out of phase images compatible with added, which correlates with the low-density lesion on noncontrast CT. A heterogeneous mass exophytic from the right kidney lower pole measures 10.9 by 9.4 by 9.2 cm (volume = 490 cm^3) on image 18/2. No internal fatty elements identified. Associated moderate restriction of diffusion. Extensive heterogeneous enhancement throughout the mass after contrast administration. I do not appreciate definite renal vein thrombosis. No well-defined retroperitoneal adenopathy. The mass extends to the margins of the perirenal space and abuts the right psoas muscle. No definite psoas muscle invasion. Left mid kidney and right kidney upper pole Bosniak category 1 cysts. Stomach/Bowel: Unremarkable Vascular/Lymphatic: Aortoiliac atherosclerotic vascular disease. Small thrombosed saccular outpouching of the abdominal aorta at the 4 o'clock position on image 299/12, measuring about 1.0 cm in diameter. No pathologic adenopathy identified. Other:  No supplemental non-categorized findings.  Musculoskeletal: Mild lumbar spondylosis and degenerative disc disease. No compelling findings of osseous metastatic disease. IMPRESSION: 1. A 10.9 cm right kidney lower pole heterogeneously enhancing mass is most compatible with renal cell carcinoma. No appreciable pathologic adenopathy or thrombosis of the right renal vein. No compelling findings of distant metastatic disease. 2. Left adrenal adenoma. 3. Aortic Atherosclerosis (ICD10-I70.0). 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 Electronically Signed   By: Van Clines M.D.   On: 07/29/2019 07:57     Assessment and plan- Patient is a 68 y.o. male with newly diagnosed neuroendocrine carcinoma large cell of the right lung stage I [id number] s/p 1 cycle of carboplatin and etoposide chemotherapy  Patient has tolerated cycle 1 of chemotherapy with some side effects such as mucositis and thrush.  Ritta Slot has resolved with fluconazole.  Mucositis over his lower lip appears to be improving.  I did ask him to use Orajel as needed and I will send him a prescription for Magic mouthwash with lidocaine to help him with his burning sensation in his mouth.  Right-sided chest wall pain: Likely musculoskeletal.  It has resolved with conservative measures.  Rib x-rays did not show any fracture  I will see him back in 1  week's time with CBC with differential, CMP for cycle 2 of carboplatin and etoposide   Visit Diagnosis 1. Malignant neoplasm of upper lobe of right lung ([place])   2. Mucositis      Dr. Randa Evens, MD, MPH [facility] [phone] 08/04/2019 12:00 PM

## 2019-08-05 ENCOUNTER — Other Ambulatory Visit: Payer: Self-pay | Admitting: Radiology

## 2019-08-05 ENCOUNTER — Ambulatory Visit
Admission: RE | Admit: 2019-08-05 | Discharge: 2019-08-05 | Disposition: A | Discharge status: Home / Self Care | Payer: Medicare Other | Source: Ambulatory Visit | Attending: Radiation Oncology | Admitting: Radiation Oncology

## 2019-08-05 ENCOUNTER — Other Ambulatory Visit: Payer: Self-pay

## 2019-08-05 DIAGNOSIS — C7A1 Malignant poorly differentiated neuroendocrine tumors: Secondary | ICD-10-CM | POA: Diagnosis not present

## 2019-08-05 DIAGNOSIS — Z510 Encounter for antineoplastic radiation therapy: Secondary | ICD-10-CM | POA: Diagnosis not present

## 2019-08-05 NOTE — Progress Notes (Signed)
Patient scheduled for Renal biopsy 08/06/2019, spoke with patient's daughter Suzi with pre procedure instructions given. Questions answered. Aware to be NPO,after MN, as well as being here at 0730 in am. Stated understanding.

## 2019-08-06 ENCOUNTER — Other Ambulatory Visit: Payer: Self-pay

## 2019-08-06 ENCOUNTER — Ambulatory Visit
Admission: RE | Admit: 2019-08-06 | Discharge: 2019-08-06 | Disposition: A | Discharge status: Home / Self Care | Payer: Medicare Other | Source: Ambulatory Visit | Attending: Radiation Oncology | Admitting: Radiation Oncology

## 2019-08-06 ENCOUNTER — Ambulatory Visit: Payer: Medicare Other | Admitting: Cardiovascular Disease

## 2019-08-06 ENCOUNTER — Ambulatory Visit
Admission: RE | Admit: 2019-08-06 | Discharge: 2019-08-06 | Disposition: A | Discharge status: Home / Self Care | Payer: Medicare Other | Source: Ambulatory Visit | Attending: Urology | Admitting: Urology

## 2019-08-06 DIAGNOSIS — Z951 Presence of aortocoronary bypass graft: Secondary | ICD-10-CM | POA: Insufficient documentation

## 2019-08-06 DIAGNOSIS — F419 Anxiety disorder, unspecified: Secondary | ICD-10-CM | POA: Insufficient documentation

## 2019-08-06 DIAGNOSIS — E785 Hyperlipidemia, unspecified: Secondary | ICD-10-CM | POA: Diagnosis not present

## 2019-08-06 DIAGNOSIS — Z883 Allergy status to other anti-infective agents status: Secondary | ICD-10-CM | POA: Insufficient documentation

## 2019-08-06 DIAGNOSIS — E119 Type 2 diabetes mellitus without complications: Secondary | ICD-10-CM | POA: Insufficient documentation

## 2019-08-06 DIAGNOSIS — Z7982 Long term (current) use of aspirin: Secondary | ICD-10-CM | POA: Diagnosis not present

## 2019-08-06 DIAGNOSIS — G4730 Sleep apnea, unspecified: Secondary | ICD-10-CM | POA: Diagnosis not present

## 2019-08-06 DIAGNOSIS — J449 Chronic obstructive pulmonary disease, unspecified: Secondary | ICD-10-CM | POA: Diagnosis not present

## 2019-08-06 DIAGNOSIS — I10 Essential (primary) hypertension: Secondary | ICD-10-CM | POA: Diagnosis not present

## 2019-08-06 DIAGNOSIS — Z79899 Other long term (current) drug therapy: Secondary | ICD-10-CM | POA: Diagnosis not present

## 2019-08-06 DIAGNOSIS — I25810 Atherosclerosis of coronary artery bypass graft(s) without angina pectoris: Secondary | ICD-10-CM | POA: Insufficient documentation

## 2019-08-06 DIAGNOSIS — C7A1 Malignant poorly differentiated neuroendocrine tumors: Secondary | ICD-10-CM | POA: Diagnosis not present

## 2019-08-06 DIAGNOSIS — Z85118 Personal history of other malignant neoplasm of bronchus and lung: Secondary | ICD-10-CM | POA: Insufficient documentation

## 2019-08-06 DIAGNOSIS — Z510 Encounter for antineoplastic radiation therapy: Secondary | ICD-10-CM | POA: Diagnosis not present

## 2019-08-06 DIAGNOSIS — Z7984 Long term (current) use of oral hypoglycemic drugs: Secondary | ICD-10-CM | POA: Diagnosis not present

## 2019-08-06 DIAGNOSIS — N2889 Other specified disorders of kidney and ureter: Secondary | ICD-10-CM | POA: Insufficient documentation

## 2019-08-06 DIAGNOSIS — Z885 Allergy status to narcotic agent status: Secondary | ICD-10-CM | POA: Diagnosis not present

## 2019-08-06 DIAGNOSIS — Z8249 Family history of ischemic heart disease and other diseases of the circulatory system: Secondary | ICD-10-CM | POA: Insufficient documentation

## 2019-08-06 DIAGNOSIS — F17210 Nicotine dependence, cigarettes, uncomplicated: Secondary | ICD-10-CM | POA: Diagnosis not present

## 2019-08-06 DIAGNOSIS — C641 Malignant neoplasm of right kidney, except renal pelvis: Secondary | ICD-10-CM | POA: Diagnosis not present

## 2019-08-06 LAB — CBC
HCT: 32 % — ABNORMAL LOW (ref 39.0–52.0)
Hemoglobin: 10.2 g/dL — ABNORMAL LOW (ref 13.0–17.0)
MCH: 28.4 pg (ref 26.0–34.0)
MCHC: 31.9 g/dL (ref 30.0–36.0)
MCV: 89.1 fL (ref 80.0–100.0)
Platelets: 418 10*3/uL — ABNORMAL HIGH (ref 150–400)
RBC: 3.59 MIL/uL — ABNORMAL LOW (ref 4.22–5.81)
RDW: 14 % (ref 11.5–15.5)
WBC: 13.8 10*3/uL — ABNORMAL HIGH (ref 4.0–10.5)
nRBC: 0 % (ref 0.0–0.2)

## 2019-08-06 LAB — GLUCOSE, CAPILLARY
Glucose-Capillary: 152 mg/dL — ABNORMAL HIGH (ref 70–99)
Glucose-Capillary: 122 mg/dL — ABNORMAL HIGH (ref 70–99)

## 2019-08-06 LAB — PROTIME-INR
INR: 1.1 (ref 0.8–1.2)
Prothrombin Time: 14.4 seconds (ref 11.4–15.2)

## 2019-08-06 MED ORDER — SODIUM CHLORIDE 0.9 % IV SOLN: INTRAVENOUS | Status: DC

## 2019-08-06 MED ORDER — FENTANYL CITRATE (PF) 100 MCG/2ML IJ SOLN
INTRAMUSCULAR | Status: AC
  Filled 2019-08-06: qty 2

## 2019-08-06 MED ORDER — MIDAZOLAM HCL 2 MG/2ML IJ SOLN
INTRAMUSCULAR | Status: AC | PRN
  Administered 2019-08-06 (×2): 1 mg via INTRAVENOUS

## 2019-08-06 MED ORDER — FENTANYL CITRATE (PF) 100 MCG/2ML IJ SOLN
INTRAMUSCULAR | Status: AC | PRN
  Administered 2019-08-06 (×2): 50 ug via INTRAVENOUS

## 2019-08-06 MED ORDER — MIDAZOLAM HCL 5 MG/5ML IJ SOLN
INTRAMUSCULAR | Status: AC
  Filled 2019-08-06: qty 5

## 2019-08-06 NOTE — Discharge Instructions (Signed)
Moderate Conscious Sedation, Adult, Care After °These instructions provide you with information about caring for yourself after your procedure. Your health care provider may also give you more specific instructions. Your treatment has been planned according to current medical practices, but problems sometimes occur. Call your health care provider if you have any problems or questions after your procedure. °What can I expect after the procedure? °After your procedure, it is common: °· To feel sleepy for several hours. °· To feel clumsy and have poor balance for several hours. °· To have poor judgment for several hours. °· To vomit if you eat too soon. °Follow these instructions at home: °For at least 24 hours after the procedure: ° °· Do not: °? Participate in activities where you could fall or become injured. °? Drive. °? Use heavy machinery. °? Drink alcohol. °? Take sleeping pills or medicines that cause drowsiness. °? Make important decisions or sign legal documents. °? Take care of children on your own. °· Rest. °Eating and drinking °· Follow the diet recommended by your health care provider. °· If you vomit: °? Drink water, juice, or soup when you can drink without vomiting. °? Make sure you have Paye or no nausea before eating solid foods. °General instructions °· Have a responsible adult stay with you until you are awake and alert. °· Take over-the-counter and prescription medicines only as told by your health care provider. °· If you smoke, do not smoke without supervision. °· Keep all follow-up visits as told by your health care provider. This is important. °Contact a health care provider if: °· You keep feeling nauseous or you keep vomiting. °· You feel light-headed. °· You develop a rash. °· You have a fever. °Get help right away if: °· You have trouble breathing. °This information is not intended to replace advice given to you by your health care provider. Make sure you discuss any questions you have  with your health care provider. °Document Released: 07/21/2013 Document Revised: 09/12/2017 Document Reviewed: 01/20/2016 °Elsevier Patient Education © 2020 [employer] °Needle Biopsy, Care After °These instructions tell you how to care for yourself after your procedure. Your doctor may also give you more specific instructions. Call your doctor if you have any problems or questions. °What can I expect after the procedure? °After the procedure, it is common to have: °· Soreness. °· Bruising. °· Mild pain. °Follow these instructions at home: ° °· Return to your normal activities as told by your doctor. Ask your doctor what activities are safe for you. °· Take over-the-counter and prescription medicines only as told by your doctor. °· Wash your hands with soap and water before you change your bandage (dressing). If you cannot use soap and water, use hand sanitizer. °· Follow instructions from your doctor about: °? How to take care of your puncture site. °? When and how to change your bandage. °? When to remove your bandage. °· Check your puncture site every day for signs of infection. Watch for: °? Redness, swelling, or pain. °? Fluid or blood.  °? Pus or a bad smell. °? Warmth. °· Do not take baths, swim, or use a hot tub until your doctor approves. Ask your doctor if you may take showers. You may only be allowed to take sponge baths. °· Keep all follow-up visits as told by your doctor. This is important. °Contact a doctor if you have: °· A fever. °· Redness, swelling, or pain at the puncture site, and it lasts longer than a few   days. °· Fluid, blood, or pus coming from the puncture site. °· Warmth coming from the puncture site. °Get help right away if: °· You have a lot of bleeding from the puncture site. °Summary °· After the procedure, it is common to have soreness, bruising, or mild pain at the puncture site. °· Check your puncture site every day for signs of infection, such as redness, swelling, or pain. °· Get  help right away if you have severe bleeding from your puncture site. °This information is not intended to replace advice given to you by your health care provider. Make sure you discuss any questions you have with your health care provider. °Document Released: 09/12/2008 Document Revised: 10/13/2017 Document Reviewed: 10/13/2017 °Elsevier Patient Education © 2020 [employer] Inc. ° °

## 2019-08-06 NOTE — H&P (Signed)
Chief Complaint: Patient was seen in consultation today for right renal mass biopsy at the request of [place]  Referring Physician(s): Brandon,Ashley  Patient Status: [facility] - Out-pt  History of Present Illness: Troy [name] is a 68 y.o. male known to our service after prior CT guided biopsy of a 2 cm RUL lung nodule by Dr. Earleen Newport on 06/23/19 that revealed large cell neuroendocrine carcinoma. Now receiving chemotherapy and radiation therapy. Workup also revealed an 11 cm right lower pole renal mass with extensive enhancement suspicious for a primary renal carcinoma. Dr. [name] has requested image guided biopsy of the mass in order to guide surgical management.  Troy [name] has no urinary complaints, flank pain, or abdominal pain.  Past Medical History:  Diagnosis Date  . Anxiety   . COPD (chronic obstructive pulmonary disease) ([place])   . Coronary artery disease 2006   Acute coronary syndrome in March 2006.   . Diabetes mellitus without complication ([place])   . Dyslipidemia   . Hypertension 10/09/2010   EF 40-45%  . Lung cancer ([place])   . 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 Sleep apnea    uses cpap machine    Past Surgical History:  Procedure Laterality Date  . Arterial evalation      no evidence of ICA stenosis  . CARDIAC CATHETERIZATION    . CARDIAC SURGERY    . CORONARY ARTERY BYPASS GRAFT  2006   Post CABG surgery with a LIMA to the LAD, a vein to the diagonal ,,a vein to the the obtuse marginal and vein to the PDA.  [name] CORONARY STENT PLACEMENT  2010  . EAR CYST EXCISION Right 10/05/2018   Procedure: Right Partial pinnectomy;  Surgeon: Izora Gala, MD;  Location: [place];  Service: ENT;  Laterality: Right;  . GRAFT(S) ANGIOGRAM  10/21/2011   Procedure: GRAFT(S) Cyril Loosen;  Surgeon: Leonie Man, MD;  Location: [facility] CATH LAB;  Service: Cardiovascular;;  . LEFT HEART CATHETERIZATION WITH CORONARY ANGIOGRAM N/A 10/21/2011   Procedure: LEFT HEART CATHETERIZATION  WITH CORONARY ANGIOGRAM;  Surgeon: Leonie Man, MD;  Location: [facility] CATH LAB;  Service: Cardiovascular;  Laterality: N/A;  . LYMPH NODE BIOPSY Right 10/05/2018   Procedure: sentinel NODE BIOPSY with right partial modified neck dissection;  Surgeon: Izora Gala, MD;  Location: [place];  Service: ENT;  Laterality: Right;  . PORTA CATH INSERTION N/A 07/12/2019   Procedure: PORTA CATH INSERTION;  Surgeon: Algernon Huxley, MD;  Location: [place] CV LAB;  Service: Cardiovascular;  Laterality: N/A;    Allergies: Codeine and Niacin and related  Medications: Prior to Admission medications   Medication Sig Start Date End Date Taking? Authorizing Provider  amLODipine (NORVASC) 10 MG tablet Take 10 mg by mouth daily.   Yes [provider]  aspirin EC 81 MG tablet Take 81 mg by mouth daily.   Yes [provider]  atorvastatin (LIPITOR) 40 MG tablet TAKE 1 TABLET (40 MG TOTAL) BY MOUTH AT BEDTIME. KEEP OCTOBER APPOINTMENT 07/28/19  Yes Troy Sine, MD  Baclofen 5 MG TABS Take 5 mg by mouth 3 (three) times daily. If 5 mg tid is not working then can increase to 10 mg tid 07/27/19  Yes Sindy Guadeloupe, MD  ezetimibe (ZETIA) 10 MG tablet TAKE 1 TABLET BY MOUTH EVERY DAY 09/21/18  Yes Troy Sine, MD  famotidine (PEPCID) 20 MG tablet TAKE 1 TABLET (20 MG TOTAL) BY MOUTH DAILY. NEEDS APPOINTMENT FOR FUTURE  REFILLS 05/24/19  Yes Troy Sine, MD  fluconazole (DIFLUCAN) 100 MG tablet Take 1 tablet (100 mg total) by mouth daily. Take 2 tablets on day 1 and then 1 tablet a day until rx ends 07/27/19  Yes Sindy Guadeloupe, MD  INCRUSE ELLIPTA 62.5 MCG/INH AEPB Inhale 1 puff into the lungs daily.  12/09/16  Yes [provider]  Ipratropium-Albuterol (COMBIVENT RESPIMAT) 20-100 MCG/ACT AERS respimat Inhale 1 puff into the lungs every 6 (six) hours.   Yes [provider]  isosorbide mononitrate (IMDUR) 60 MG 24 hr tablet TAKE 1 TABLET BY MOUTH EVERY DAY 05/08/15  Yes Troy Sine, MD  losartan-hydrochlorothiazide (HYZAAR) 100-12.5 MG tablet Take 1 tablet by mouth daily. 12/31/16  Yes Troy Sine, MD  magic mouthwash w/lidocaine SOLN Take 5 mLs by mouth 4 (four) times daily as needed for mouth pain. 08/03/19  Yes Sindy Guadeloupe, MD  metFORMIN (GLUCOPHAGE) 500 MG tablet TAKE 1 TABLET (500 MG TOTAL) BY MOUTH 2 (TWO) TIMES DAILY WITH A MEAL. 07/14/18  Yes Troy Sine, MD  nitroGLYCERIN (NITROSTAT) 0.4 MG SL tablet Place 0.4 mg under the tongue every 5 (five) minutes as needed for chest pain.   Yes [provider]  nystatin (MYCOSTATIN) 100000 UNIT/ML suspension Take 5 mLs (500,000 Units total) by mouth 4 (four) times daily. 07/27/19  Yes Sindy Guadeloupe, MD  ranolazine (RANEXA) 1000 MG SR tablet TAKE 1 TABLET BY MOUTH TWICE A DAY 06/11/19  Yes Troy Sine, MD  ALPRAZolam Duanne Moron) 0.25 MG tablet Take 0.25 mg by mouth 3 (three) times daily.     [provider]  busPIRone (BUSPAR) 7.5 MG tablet Take 1 tablet by mouth 2 (two) times daily. 09/04/14   [provider]  dexamethasone (DECADRON) 4 MG tablet Take 2 tablets (8 mg total) by mouth daily. Start the day after chemotherapy for 1 day. 07/05/19   Sindy Guadeloupe, MD  lidocaine-prilocaine (EMLA) cream Apply to affected area once 07/05/19   Sindy Guadeloupe, MD  LORazepam (ATIVAN) 0.5 MG tablet Take 1 tablet 30 minutes prior to each scan 07/15/19   Sindy Guadeloupe, MD  ondansetron (ZOFRAN) 8 MG tablet Take 1 tablet (8 mg total) by mouth 2 (two) times daily as needed for refractory nausea / vomiting. Start on day 3 after carboplatin chemo. 07/05/19   Sindy Guadeloupe, MD  prochlorperazine (COMPAZINE) 10 MG tablet Take 1 tablet (10 mg total) by mouth every 6 (six) hours as needed (Nausea or vomiting). 07/05/19   Sindy Guadeloupe, MD  traMADol (ULTRAM) 50 MG tablet Take 1 tablet (50 mg total) by mouth every 6 (six) hours as needed. 07/28/19   Sindy Guadeloupe, MD     Family History  Problem Relation Age of  Onset  . Hypertension Mother   . Heart attack Mother   . Heart attack Father 71    Social History   Socioeconomic History  . Marital status: Married    Spouse name: JoAnne Perz  . Number of children: Not on file  . Years of education: Not on file  . Highest education level: Not on file  Occupational History  . Not on file  Social Needs  . Financial resource strain: Not on file  . Food insecurity    Worry: Not on file    Inability: Not on file  . Transportation needs    Medical: Not on file    Non-medical: Not on file  Tobacco  Use  . Smoking status: Current Every Day Smoker    Packs/day: 2.00    Years: 50.00    Pack years: 100.00    Types: Cigarettes  . Smokeless tobacco: Former Systems developer    Types: [place] date: 01/23/2013  Substance and Sexual Activity  . Alcohol use: No  . Drug use: Not Currently  . Sexual activity: Not Currently  Lifestyle  . Physical activity    Days per week: Not on file    Minutes per session: Not on file  . Stress: Not on file  Relationships  . Social Herbalist on phone: Not on file    Gets together: Not on file    Attends religious service: Not on file    Active member of club or organization: Not on file    Attends meetings of clubs or organizations: Not on file    Relationship status: Not on file  Other Topics Concern  . Not on file  Social History Narrative   Lives with son and grandson    ECOG Status: 1 - Symptomatic but completely ambulatory  Review of Systems: A 12 point ROS discussed and pertinent positives are indicated in the HPI above.  All other systems are negative.  Review of Systems  Constitutional: Positive for fatigue.  HENT: Positive for mouth sores.   Respiratory: Negative.   Cardiovascular: Negative.   Gastrointestinal: Negative.   Genitourinary: Negative.   Musculoskeletal: Negative.   Neurological: Negative.     Vital Signs: BP 105/64   Pulse 86   Temp 98.1 F (36.7 C) (Oral)   Resp  16   Ht 5\' 8"  (1.727 m)   Wt 78.5 kg   SpO2 93%   BMI 26.30 kg/m   Physical Exam Constitutional:      General: He is not in acute distress.    Appearance: He is not toxic-appearing or diaphoretic.  HENT:     Head: Normocephalic and atraumatic.  Neck:     Musculoskeletal: Neck supple.  Cardiovascular:     Rate and Rhythm: Normal rate and regular rhythm.     Heart sounds: Normal heart sounds. No murmur. No gallop.   Pulmonary:     Effort: Pulmonary effort is normal. No respiratory distress.     Breath sounds: No stridor. Wheezing present. No rhonchi or rales.  Abdominal:     General: Abdomen is flat. Bowel sounds are normal. There is no distension.     Palpations: Abdomen is soft. There is no mass.     Tenderness: There is no abdominal tenderness. There is no right CVA tenderness, left CVA tenderness, guarding or rebound.     Hernia: No hernia is present.  Musculoskeletal:        General: No swelling.  Skin:    General: Skin is warm and dry.  Neurological:     General: No focal deficit present.     Mental Status: He is alert and oriented to person, place, and time.     Imaging: Dg Ribs Unilateral Right  Result Date: 07/30/2019 CLINICAL DATA:  Rib pain, history of right lung cancer EXAM: RIGHT RIBS - 2 VIEW COMPARISON:  Chest radiograph dated 06/23/2019 FINDINGS: No fracture or other bone lesions are seen involving the ribs. Median sternotomy wires are noted. A left internal jugular central venous port catheter has tip overlying the left brachiocephalic vein. IMPRESSION: Negative for fracture or other acute bone lesion. No pneumothorax or effusion. Electronically Signed  By: Zerita Boers M.D.   On: 07/30/2019 17:12   Mr Jeri Cos WU Contrast  Result Date: 07/20/2019 CLINICAL DATA:  68 year old male recently diagnosed with lung cancer. EXAM: MRI HEAD WITHOUT AND WITH CONTRAST TECHNIQUE: Multiplanar, multiecho pulse sequences of the brain and surrounding structures were  obtained without and with intravenous contrast. CONTRAST:  7mL GADAVIST GADOBUTROL 1 MMOL/ML IV SOLN COMPARISON:  [place] hospital head CT without contrast 02/22/2008. FINDINGS: Brain: No abnormal enhancement identified. No midline shift, mass effect, or evidence of intracranial mass lesion. No dural thickening. Cerebral volume is within normal limits for age. No restricted diffusion to suggest acute infarction. No ventriculomegaly, extra-axial collection or acute intracranial hemorrhage. Cervicomedullary junction and pituitary are within normal limits. Widely scattered small foci of nonspecific cerebral white matter T2 and FLAIR hyperintensity, moderate for age. No cortical encephalomalacia or definite chronic cerebral blood products. The deep gray nuclei, brainstem and cerebellum are within normal limits for age. Vascular: Major intracranial vascular flow voids are preserved. The major dural venous sinuses are enhancing and appear to be patent. Skull and upper cervical spine: Negative visible cervical spine and spinal cord. Visualized bone marrow signal is within normal limits. Sinuses/Orbits: Postoperative changes to both globes. Right maxillary sinus mucosal thickening or small fluid level. Other: Mastoids are clear. Visible internal auditory structures appear normal. Scalp and face soft tissues appear negative. IMPRESSION: 1.  No metastatic disease or acute intracranial abnormality. 2. Moderate for age nonspecific cerebral white matter signal changes, most commonly due to chronic small vessel disease. Electronically Signed   By: Genevie Ann M.D.   On: 07/20/2019 11:01   Mr Abdomen W Wo Contrast  Result Date: 07/29/2019 CLINICAL DATA:  Right renal mass. Large cell neuroendocrine carcinoma of the lungs. EXAM: MRI ABDOMEN WITHOUT AND WITH CONTRAST TECHNIQUE: Multiplanar multisequence MR imaging of the abdomen was performed both before and after the administration of intravenous contrast. CONTRAST:  35mL  GADAVIST GADOBUTROL 1 MMOL/ML IV SOLN COMPARISON:  Multiple exams, including PET-CT from 06/10/2019 FINDINGS: Lower chest: Unremarkable Hepatobiliary: Unremarkable where included. Pancreas:  Unremarkable Spleen:  Unremarkable where included. Adrenals/Urinary Tract: There is dropout of signal in the 2.7 by 2.8 cm left adrenal mass on out of phase images compatible with added, which correlates with the low-density lesion on noncontrast CT. A heterogeneous mass exophytic from the right kidney lower pole measures 10.9 by 9.4 by 9.2 cm (volume = 490 cm^3) on image 18/2. No internal fatty elements identified. Associated moderate restriction of diffusion. Extensive heterogeneous enhancement throughout the mass after contrast administration. I do not appreciate definite renal vein thrombosis. No well-defined retroperitoneal adenopathy. The mass extends to the margins of the perirenal space and abuts the right psoas muscle. No definite psoas muscle invasion. Left mid kidney and right kidney upper pole Bosniak category 1 cysts. Stomach/Bowel: Unremarkable Vascular/Lymphatic: Aortoiliac atherosclerotic vascular disease. Small thrombosed saccular outpouching of the abdominal aorta at the 4 o'clock position on image 299/12, measuring about 1.0 cm in diameter. No pathologic adenopathy identified. Other:  No supplemental non-categorized findings. Musculoskeletal: Mild lumbar spondylosis and degenerative disc disease. No compelling findings of osseous metastatic disease. IMPRESSION: 1. A 10.9 cm right kidney lower pole heterogeneously enhancing mass is most compatible with renal cell carcinoma. No appreciable pathologic adenopathy or thrombosis of the right renal vein. No compelling findings of distant metastatic disease. 2. Left adrenal adenoma. 3. Aortic Atherosclerosis (ICD10-I70.0). 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 Electronically Signed    By: Van Clines M.D.   On: 07/29/2019 07:57    Labs:  CBC: Recent Labs    07/02/19 1143 07/20/19 0807 07/27/19 1048 08/06/19 0726  WBC 12.9* 12.8* 3.8* 13.8*  HGB 12.7* 12.1* 11.7* 10.2*  HCT 39.5 37.1* 35.4* 32.0*  PLT 331 319 138* 418*    COAGS: Recent Labs    06/23/19 1028 08/06/19 0726  INR 1.0 1.1    BMP: Recent Labs    09/29/18 1504 07/02/19 1143 07/20/19 0807 07/27/19 1048  NA 137 139 133* 134*  K 4.0 4.1 4.1 4.6  CL 100 103 101 99  CO2 25 28 25 26   GLUCOSE 96 118* 121* 178*  BUN 14 11 11 22   CALCIUM 9.6 9.3 9.0 8.9  CREATININE 1.26* 1.27* 1.27* 0.96  GFRNONAA 58* 57* 57* >60  GFRAA >60 >60 >60 >60    LIVER FUNCTION TESTS: Recent Labs    07/02/19 1143 07/20/19 0807 07/27/19 1048  BILITOT 0.9 0.7 1.7*  AST 14* 15 12*  ALT 14 12 13   ALKPHOS 100 105 61  PROT 8.0 7.8 6.9  ALBUMIN 4.0 3.7 3.3*    Assessment and Plan:  For US guided core biopsy of large right LP renal mass today. Risks and benefits of renal mass biopsy was discussed with the patient and/or patient's family including, but not limited to bleeding, infection, damage to adjacent structures or low yield requiring additional tests. All of the questions were answered and there is agreement to proceed. Consent signed and in chart.  Electronically Signed: Azzie Roup, MD 08/06/2019, 8:27 AM   I spent a total of 10 Minutes in face to face in clinical consultation, greater than 50% of which was counseling/coordinating care for right renal mass biopsy.

## 2019-08-06 NOTE — Procedures (Signed)
Interventional Radiology Procedure Note  Procedure: US Guided Biopsy of right renal mass  Complications: None  Estimated Blood Loss: < 10 mL  Findings: 18 G core biopsy of right lower pole renal mass performed under US guidance.  Three core samples obtained and sent to Pathology.  Venetia Night. Kathlene Cote, M.D Pager:  [phone]

## 2019-08-08 ENCOUNTER — Ambulatory Visit: Payer: Medicare Other

## 2019-08-09 ENCOUNTER — Ambulatory Visit
Admission: RE | Admit: 2019-08-09 | Discharge: 2019-08-09 | Disposition: A | Discharge status: Home / Self Care | Payer: Medicare Other | Source: Ambulatory Visit | Attending: Radiation Oncology | Admitting: Radiation Oncology

## 2019-08-09 ENCOUNTER — Other Ambulatory Visit: Payer: Self-pay

## 2019-08-09 ENCOUNTER — Encounter: Payer: Self-pay | Admitting: Oncology

## 2019-08-09 DIAGNOSIS — Z510 Encounter for antineoplastic radiation therapy: Secondary | ICD-10-CM | POA: Diagnosis not present

## 2019-08-09 DIAGNOSIS — C7A1 Malignant poorly differentiated neuroendocrine tumors: Secondary | ICD-10-CM | POA: Diagnosis not present

## 2019-08-09 LAB — SURGICAL PATHOLOGY

## 2019-08-09 NOTE — Progress Notes (Signed)
Patient stated that he had been doing well except that he feels tired and fatigued all the time.

## 2019-08-10 ENCOUNTER — Inpatient Hospital Stay: Payer: Medicare Other

## 2019-08-10 ENCOUNTER — Ambulatory Visit
Admission: RE | Admit: 2019-08-10 | Discharge: 2019-08-10 | Disposition: A | Discharge status: Home / Self Care | Payer: Medicare Other | Source: Ambulatory Visit | Attending: Radiation Oncology | Admitting: Radiation Oncology

## 2019-08-10 ENCOUNTER — Inpatient Hospital Stay (HOSPITAL_BASED_OUTPATIENT_CLINIC_OR_DEPARTMENT_OTHER): Payer: Medicare Other | Admitting: Oncology

## 2019-08-10 ENCOUNTER — Other Ambulatory Visit: Payer: Self-pay

## 2019-08-10 VITALS — BP 121/67 | HR 81 | Temp 97.3°F | Ht 68.0 in | Wt 175.0 lb

## 2019-08-10 DIAGNOSIS — Z5111 Encounter for antineoplastic chemotherapy: Secondary | ICD-10-CM

## 2019-08-10 DIAGNOSIS — J449 Chronic obstructive pulmonary disease, unspecified: Secondary | ICD-10-CM | POA: Diagnosis not present

## 2019-08-10 DIAGNOSIS — R0609 Other forms of dyspnea: Secondary | ICD-10-CM | POA: Diagnosis not present

## 2019-08-10 DIAGNOSIS — E785 Hyperlipidemia, unspecified: Secondary | ICD-10-CM | POA: Diagnosis not present

## 2019-08-10 DIAGNOSIS — T451X5A Adverse effect of antineoplastic and immunosuppressive drugs, initial encounter: Secondary | ICD-10-CM | POA: Diagnosis not present

## 2019-08-10 DIAGNOSIS — I2510 Atherosclerotic heart disease of native coronary artery without angina pectoris: Secondary | ICD-10-CM | POA: Diagnosis not present

## 2019-08-10 DIAGNOSIS — B379 Candidiasis, unspecified: Secondary | ICD-10-CM | POA: Diagnosis not present

## 2019-08-10 DIAGNOSIS — C3411 Malignant neoplasm of upper lobe, right bronchus or lung: Secondary | ICD-10-CM | POA: Diagnosis not present

## 2019-08-10 DIAGNOSIS — Z79899 Other long term (current) drug therapy: Secondary | ICD-10-CM | POA: Diagnosis not present

## 2019-08-10 DIAGNOSIS — Z85820 Personal history of malignant melanoma of skin: Secondary | ICD-10-CM | POA: Diagnosis not present

## 2019-08-10 DIAGNOSIS — G4730 Sleep apnea, unspecified: Secondary | ICD-10-CM | POA: Diagnosis not present

## 2019-08-10 DIAGNOSIS — Z7982 Long term (current) use of aspirin: Secondary | ICD-10-CM | POA: Diagnosis not present

## 2019-08-10 DIAGNOSIS — R5381 Other malaise: Secondary | ICD-10-CM | POA: Diagnosis not present

## 2019-08-10 DIAGNOSIS — C7A8 Other malignant neuroendocrine tumors: Secondary | ICD-10-CM | POA: Diagnosis not present

## 2019-08-10 DIAGNOSIS — Z510 Encounter for antineoplastic radiation therapy: Secondary | ICD-10-CM | POA: Diagnosis not present

## 2019-08-10 DIAGNOSIS — D6481 Anemia due to antineoplastic chemotherapy: Secondary | ICD-10-CM

## 2019-08-10 DIAGNOSIS — Z7984 Long term (current) use of oral hypoglycemic drugs: Secondary | ICD-10-CM | POA: Diagnosis not present

## 2019-08-10 DIAGNOSIS — C7A1 Malignant poorly differentiated neuroendocrine tumors: Secondary | ICD-10-CM | POA: Diagnosis not present

## 2019-08-10 DIAGNOSIS — R5383 Other fatigue: Secondary | ICD-10-CM | POA: Diagnosis not present

## 2019-08-10 DIAGNOSIS — I10 Essential (primary) hypertension: Secondary | ICD-10-CM | POA: Diagnosis not present

## 2019-08-10 DIAGNOSIS — C641 Malignant neoplasm of right kidney, except renal pelvis: Secondary | ICD-10-CM | POA: Diagnosis not present

## 2019-08-10 DIAGNOSIS — Z9989 Dependence on other enabling machines and devices: Secondary | ICD-10-CM | POA: Diagnosis not present

## 2019-08-10 DIAGNOSIS — R21 Rash and other nonspecific skin eruption: Secondary | ICD-10-CM | POA: Diagnosis not present

## 2019-08-10 DIAGNOSIS — E119 Type 2 diabetes mellitus without complications: Secondary | ICD-10-CM | POA: Diagnosis not present

## 2019-08-10 DIAGNOSIS — R0781 Pleurodynia: Secondary | ICD-10-CM | POA: Diagnosis not present

## 2019-08-10 DIAGNOSIS — Z95828 Presence of other vascular implants and grafts: Secondary | ICD-10-CM

## 2019-08-10 LAB — CBC WITH DIFFERENTIAL/PLATELET
Abs Immature Granulocytes: 0.86 10*3/uL — ABNORMAL HIGH (ref 0.00–0.07)
Basophils Absolute: 0.1 10*3/uL (ref 0.0–0.1)
Basophils Relative: 0 %
Eosinophils Absolute: 0 10*3/uL (ref 0.0–0.5)
Eosinophils Relative: 0 %
HCT: 30.4 % — ABNORMAL LOW (ref 39.0–52.0)
Hemoglobin: 9.7 g/dL — ABNORMAL LOW (ref 13.0–17.0)
Immature Granulocytes: 5 %
Lymphocytes Relative: 10 %
Lymphs Abs: 1.6 10*3/uL (ref 0.7–4.0)
MCH: 28.9 pg (ref 26.0–34.0)
MCHC: 31.9 g/dL (ref 30.0–36.0)
MCV: 90.5 fL (ref 80.0–100.0)
Monocytes Absolute: 1.8 10*3/uL — ABNORMAL HIGH (ref 0.1–1.0)
Monocytes Relative: 11 %
Neutro Abs: 11.6 10*3/uL — ABNORMAL HIGH (ref 1.7–7.7)
Neutrophils Relative %: 74 %
Platelets: 502 10*3/uL — ABNORMAL HIGH (ref 150–400)
RBC: 3.36 MIL/uL — ABNORMAL LOW (ref 4.22–5.81)
RDW: 14.1 % (ref 11.5–15.5)
WBC: 15.9 10*3/uL — ABNORMAL HIGH (ref 4.0–10.5)
nRBC: 0 % (ref 0.0–0.2)

## 2019-08-10 LAB — COMPREHENSIVE METABOLIC PANEL
ALT: 18 U/L (ref 0–44)
AST: 13 U/L — ABNORMAL LOW (ref 15–41)
Albumin: 2.6 g/dL — ABNORMAL LOW (ref 3.5–5.0)
Alkaline Phosphatase: 84 U/L (ref 38–126)
Anion gap: 8 (ref 5–15)
BUN: 12 mg/dL (ref 8–23)
CO2: 25 mmol/L (ref 22–32)
Calcium: 8.7 mg/dL — ABNORMAL LOW (ref 8.9–10.3)
Chloride: 102 mmol/L (ref 98–111)
Creatinine, Ser: 1.15 mg/dL (ref 0.61–1.24)
GFR calc Af Amer: >60 mL/min (ref >60)
GFR calc non Af Amer: >60 mL/min (ref >60)
Glucose, Bld: 137 mg/dL — ABNORMAL HIGH (ref 70–99)
Potassium: 4.7 mmol/L (ref 3.5–5.1)
Sodium: 135 mmol/L (ref 135–145)
Total Bilirubin: 0.5 mg/dL (ref 0.3–1.2)
Total Protein: 6.7 g/dL (ref 6.5–8.1)

## 2019-08-10 MED ORDER — HEPARIN SOD (PORK) LOCK FLUSH 100 UNIT/ML IV SOLN
500.0000 [IU] | Freq: Once | INTRAVENOUS | Status: AC
  Administered 2019-08-10: 500 [IU] via INTRAVENOUS
  Filled 2019-08-10: qty 5

## 2019-08-10 MED ORDER — PALONOSETRON HCL INJECTION 0.25 MG/5ML
0.2500 mg | Freq: Once | INTRAVENOUS | Status: AC
  Administered 2019-08-10: 11:00:00 0.25 mg via INTRAVENOUS
  Filled 2019-08-10: qty 5

## 2019-08-10 MED ORDER — SODIUM CHLORIDE 0.9 % IV SOLN
440.0000 mg | Freq: Once | INTRAVENOUS | Status: AC
  Administered 2019-08-10: 440 mg via INTRAVENOUS
  Filled 2019-08-10: qty 44

## 2019-08-10 MED ORDER — SODIUM CHLORIDE 0.9% FLUSH
10.0000 mL | Freq: Once | INTRAVENOUS | Status: AC
  Administered 2019-08-10: 10 mL via INTRAVENOUS
  Filled 2019-08-10: qty 10

## 2019-08-10 MED ORDER — SODIUM CHLORIDE 0.9 % IV SOLN
Freq: Once | INTRAVENOUS | Status: AC
  Administered 2019-08-10: 11:00:00 via INTRAVENOUS
  Filled 2019-08-10: qty 5

## 2019-08-10 MED ORDER — SODIUM CHLORIDE 0.9 % IV SOLN
Freq: Once | INTRAVENOUS | Status: AC
  Administered 2019-08-10: 11:00:00 via INTRAVENOUS
  Filled 2019-08-10: qty 250

## 2019-08-10 MED ORDER — SODIUM CHLORIDE 0.9 % IV SOLN
100.0000 mg/m2 | Freq: Once | INTRAVENOUS | Status: AC
  Administered 2019-08-10: 200 mg via INTRAVENOUS
  Filled 2019-08-10: qty 10

## 2019-08-10 NOTE — Progress Notes (Signed)
Proceed with treatment today per Dr. Rao.  

## 2019-08-11 ENCOUNTER — Other Ambulatory Visit: Payer: Self-pay

## 2019-08-11 ENCOUNTER — Ambulatory Visit
Admission: RE | Admit: 2019-08-11 | Discharge: 2019-08-11 | Disposition: A | Discharge status: Home / Self Care | Payer: Medicare Other | Source: Ambulatory Visit | Attending: Radiation Oncology | Admitting: Radiation Oncology

## 2019-08-11 ENCOUNTER — Telehealth: Payer: Self-pay | Admitting: *Deleted

## 2019-08-11 ENCOUNTER — Inpatient Hospital Stay: Payer: Medicare Other

## 2019-08-11 VITALS — BP 115/62 | HR 87 | Temp 97.3°F

## 2019-08-11 DIAGNOSIS — C7A8 Other malignant neuroendocrine tumors: Secondary | ICD-10-CM | POA: Diagnosis not present

## 2019-08-11 DIAGNOSIS — J449 Chronic obstructive pulmonary disease, unspecified: Secondary | ICD-10-CM | POA: Diagnosis not present

## 2019-08-11 DIAGNOSIS — Z85820 Personal history of malignant melanoma of skin: Secondary | ICD-10-CM | POA: Diagnosis not present

## 2019-08-11 DIAGNOSIS — I10 Essential (primary) hypertension: Secondary | ICD-10-CM | POA: Diagnosis not present

## 2019-08-11 DIAGNOSIS — E119 Type 2 diabetes mellitus without complications: Secondary | ICD-10-CM | POA: Diagnosis not present

## 2019-08-11 DIAGNOSIS — E785 Hyperlipidemia, unspecified: Secondary | ICD-10-CM | POA: Diagnosis not present

## 2019-08-11 DIAGNOSIS — Z7982 Long term (current) use of aspirin: Secondary | ICD-10-CM | POA: Diagnosis not present

## 2019-08-11 DIAGNOSIS — I2510 Atherosclerotic heart disease of native coronary artery without angina pectoris: Secondary | ICD-10-CM | POA: Diagnosis not present

## 2019-08-11 DIAGNOSIS — Z9989 Dependence on other enabling machines and devices: Secondary | ICD-10-CM | POA: Diagnosis not present

## 2019-08-11 DIAGNOSIS — Z79899 Other long term (current) drug therapy: Secondary | ICD-10-CM | POA: Diagnosis not present

## 2019-08-11 DIAGNOSIS — C641 Malignant neoplasm of right kidney, except renal pelvis: Secondary | ICD-10-CM | POA: Diagnosis not present

## 2019-08-11 DIAGNOSIS — C3411 Malignant neoplasm of upper lobe, right bronchus or lung: Secondary | ICD-10-CM

## 2019-08-11 DIAGNOSIS — D6481 Anemia due to antineoplastic chemotherapy: Secondary | ICD-10-CM | POA: Diagnosis not present

## 2019-08-11 DIAGNOSIS — Z5111 Encounter for antineoplastic chemotherapy: Secondary | ICD-10-CM | POA: Diagnosis not present

## 2019-08-11 DIAGNOSIS — R21 Rash and other nonspecific skin eruption: Secondary | ICD-10-CM | POA: Diagnosis not present

## 2019-08-11 DIAGNOSIS — R0781 Pleurodynia: Secondary | ICD-10-CM | POA: Diagnosis not present

## 2019-08-11 DIAGNOSIS — G4730 Sleep apnea, unspecified: Secondary | ICD-10-CM | POA: Diagnosis not present

## 2019-08-11 DIAGNOSIS — Z7984 Long term (current) use of oral hypoglycemic drugs: Secondary | ICD-10-CM | POA: Diagnosis not present

## 2019-08-11 DIAGNOSIS — C7A1 Malignant poorly differentiated neuroendocrine tumors: Secondary | ICD-10-CM | POA: Diagnosis not present

## 2019-08-11 DIAGNOSIS — B379 Candidiasis, unspecified: Secondary | ICD-10-CM | POA: Diagnosis not present

## 2019-08-11 DIAGNOSIS — R5383 Other fatigue: Secondary | ICD-10-CM | POA: Diagnosis not present

## 2019-08-11 DIAGNOSIS — Z510 Encounter for antineoplastic radiation therapy: Secondary | ICD-10-CM | POA: Diagnosis not present

## 2019-08-11 DIAGNOSIS — T451X5A Adverse effect of antineoplastic and immunosuppressive drugs, initial encounter: Secondary | ICD-10-CM | POA: Diagnosis not present

## 2019-08-11 DIAGNOSIS — R0609 Other forms of dyspnea: Secondary | ICD-10-CM | POA: Diagnosis not present

## 2019-08-11 DIAGNOSIS — R5381 Other malaise: Secondary | ICD-10-CM | POA: Diagnosis not present

## 2019-08-11 MED ORDER — HEPARIN SOD (PORK) LOCK FLUSH 100 UNIT/ML IV SOLN
500.0000 [IU] | Freq: Once | INTRAVENOUS | Status: AC | PRN
  Administered 2019-08-11: 500 [IU]
  Filled 2019-08-11: qty 5

## 2019-08-11 MED ORDER — SODIUM CHLORIDE 0.9 % IV SOLN
Freq: Once | INTRAVENOUS | Status: AC
  Administered 2019-08-11: 14:00:00 via INTRAVENOUS
  Filled 2019-08-11: qty 250

## 2019-08-11 MED ORDER — SODIUM CHLORIDE 0.9 % IV SOLN
100.0000 mg/m2 | Freq: Once | INTRAVENOUS | Status: AC
  Administered 2019-08-11: 200 mg via INTRAVENOUS
  Filled 2019-08-11: qty 10

## 2019-08-11 MED ORDER — DEXAMETHASONE SODIUM PHOSPHATE 10 MG/ML IJ SOLN
10.0000 mg | Freq: Once | INTRAMUSCULAR | Status: AC
  Administered 2019-08-11: 14:00:00 10 mg via INTRAVENOUS
  Filled 2019-08-11: qty 1

## 2019-08-11 NOTE — Progress Notes (Signed)
Port flushed without difficulty, no swelling or redness noted, and pt denies pain, no blood return noted. Dr. Janese Banks aware and per Dr. Janese Banks okay to proceed with treatment as scheduled at this time.

## 2019-08-11 NOTE — Telephone Encounter (Addendum)
Informed daughter-Susie-verbalized understanding.   ----- Message from Hollice Espy, MD sent at 08/10/2019  8:09 AM EDT ----- Please let this patient know that I received his renal mass biopsy results and it in fact is kidney cancer different from his other cancers.  We will plan to follow-up in December as scheduled to discuss timing of nephrectomy.  Hollice Espy, MD

## 2019-08-12 ENCOUNTER — Ambulatory Visit
Admission: RE | Admit: 2019-08-12 | Discharge: 2019-08-12 | Disposition: A | Discharge status: Home / Self Care | Payer: Medicare Other | Source: Ambulatory Visit | Attending: Radiation Oncology | Admitting: Radiation Oncology

## 2019-08-12 ENCOUNTER — Inpatient Hospital Stay: Payer: Medicare Other

## 2019-08-12 ENCOUNTER — Other Ambulatory Visit: Payer: Self-pay

## 2019-08-12 VITALS — BP 127/77 | HR 92 | Temp 96.1°F

## 2019-08-12 DIAGNOSIS — Z510 Encounter for antineoplastic radiation therapy: Secondary | ICD-10-CM | POA: Diagnosis not present

## 2019-08-12 DIAGNOSIS — E119 Type 2 diabetes mellitus without complications: Secondary | ICD-10-CM | POA: Diagnosis not present

## 2019-08-12 DIAGNOSIS — R5381 Other malaise: Secondary | ICD-10-CM | POA: Diagnosis not present

## 2019-08-12 DIAGNOSIS — J449 Chronic obstructive pulmonary disease, unspecified: Secondary | ICD-10-CM | POA: Diagnosis not present

## 2019-08-12 DIAGNOSIS — Z7984 Long term (current) use of oral hypoglycemic drugs: Secondary | ICD-10-CM | POA: Diagnosis not present

## 2019-08-12 DIAGNOSIS — Z79899 Other long term (current) drug therapy: Secondary | ICD-10-CM | POA: Diagnosis not present

## 2019-08-12 DIAGNOSIS — G4730 Sleep apnea, unspecified: Secondary | ICD-10-CM | POA: Diagnosis not present

## 2019-08-12 DIAGNOSIS — C3411 Malignant neoplasm of upper lobe, right bronchus or lung: Secondary | ICD-10-CM

## 2019-08-12 DIAGNOSIS — I10 Essential (primary) hypertension: Secondary | ICD-10-CM | POA: Diagnosis not present

## 2019-08-12 DIAGNOSIS — Z7982 Long term (current) use of aspirin: Secondary | ICD-10-CM | POA: Diagnosis not present

## 2019-08-12 DIAGNOSIS — Z85820 Personal history of malignant melanoma of skin: Secondary | ICD-10-CM | POA: Diagnosis not present

## 2019-08-12 DIAGNOSIS — R5383 Other fatigue: Secondary | ICD-10-CM | POA: Diagnosis not present

## 2019-08-12 DIAGNOSIS — T451X5A Adverse effect of antineoplastic and immunosuppressive drugs, initial encounter: Secondary | ICD-10-CM | POA: Diagnosis not present

## 2019-08-12 DIAGNOSIS — Z5111 Encounter for antineoplastic chemotherapy: Secondary | ICD-10-CM | POA: Diagnosis not present

## 2019-08-12 DIAGNOSIS — I2510 Atherosclerotic heart disease of native coronary artery without angina pectoris: Secondary | ICD-10-CM | POA: Diagnosis not present

## 2019-08-12 DIAGNOSIS — R0609 Other forms of dyspnea: Secondary | ICD-10-CM | POA: Diagnosis not present

## 2019-08-12 DIAGNOSIS — R0781 Pleurodynia: Secondary | ICD-10-CM | POA: Diagnosis not present

## 2019-08-12 DIAGNOSIS — C641 Malignant neoplasm of right kidney, except renal pelvis: Secondary | ICD-10-CM | POA: Diagnosis not present

## 2019-08-12 DIAGNOSIS — C7A1 Malignant poorly differentiated neuroendocrine tumors: Secondary | ICD-10-CM | POA: Diagnosis not present

## 2019-08-12 DIAGNOSIS — C7A8 Other malignant neuroendocrine tumors: Secondary | ICD-10-CM | POA: Diagnosis not present

## 2019-08-12 DIAGNOSIS — B379 Candidiasis, unspecified: Secondary | ICD-10-CM | POA: Diagnosis not present

## 2019-08-12 DIAGNOSIS — E785 Hyperlipidemia, unspecified: Secondary | ICD-10-CM | POA: Diagnosis not present

## 2019-08-12 DIAGNOSIS — R21 Rash and other nonspecific skin eruption: Secondary | ICD-10-CM | POA: Diagnosis not present

## 2019-08-12 DIAGNOSIS — Z9989 Dependence on other enabling machines and devices: Secondary | ICD-10-CM | POA: Diagnosis not present

## 2019-08-12 DIAGNOSIS — D6481 Anemia due to antineoplastic chemotherapy: Secondary | ICD-10-CM | POA: Diagnosis not present

## 2019-08-12 MED ORDER — HEPARIN SOD (PORK) LOCK FLUSH 100 UNIT/ML IV SOLN
500.0000 [IU] | Freq: Once | INTRAVENOUS | Status: AC | PRN
  Administered 2019-08-12: 500 [IU]
  Filled 2019-08-12: qty 5

## 2019-08-12 MED ORDER — SODIUM CHLORIDE 0.9 % IV SOLN
100.0000 mg/m2 | Freq: Once | INTRAVENOUS | Status: AC
  Administered 2019-08-12: 200 mg via INTRAVENOUS
  Filled 2019-08-12: qty 10

## 2019-08-12 MED ORDER — DEXAMETHASONE SODIUM PHOSPHATE 10 MG/ML IJ SOLN
10.0000 mg | Freq: Once | INTRAMUSCULAR | Status: AC
  Administered 2019-08-12: 14:00:00 10 mg via INTRAVENOUS
  Filled 2019-08-12: qty 1

## 2019-08-12 MED ORDER — SODIUM CHLORIDE 0.9 % IV SOLN
Freq: Once | INTRAVENOUS | Status: AC
  Administered 2019-08-12: 14:00:00 via INTRAVENOUS
  Filled 2019-08-12: qty 250

## 2019-08-12 NOTE — Progress Notes (Signed)
No blood return noted from port after multiple NS flushes. Port flushed without difficulty, no swelling, no redness, and pt denies any pain or discomfort. MD made aware, verbal order to continue with treatment at this time. Pt updated and all questions answered at this time.   Troy [name] [employer]

## 2019-08-12 NOTE — Progress Notes (Signed)
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 As a part of his staging work-up he underwent CT chest as well as CT neck. 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 CT neck was unremarkable however CT chest showed a right upper lobe nodule 2.1 x 1.7 cm in with minimal spiculation as well as a 0.5 cm right lower lobe nodule. No mediastinal or hilar adenopathy was noted. Patient was referred to [facility] for further work-up given his preference. He underwent a PET CT scan on 06/10/2019 which showed a hypermetabolic right upper lobe lung nodule measuring 2 cm. Equivocal right infrahilar hypermetabolism with an SUV of 3.1. 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 Large right kidney mass measuring 9.7 x 9 cm. Patient underwent CT-guided lung biopsy which was consistent with large cell neuroendocrine carcinoma.  Concurrent chemoradiation started on 07/20/2019 with carboplatin and etoposide  Interval history-oral thrush and hiccups have resolved.  Right-sided chest wall pain has also resolved.  He did have crusted lesions on his lip which appeared to be healing.  He reports chronic fatigue and exertional shortness of breath.   Denies any other complaints at this time  ECOG PS- 2 Pain scale- 0   Review of systems- Review of Systems  Constitutional: Positive for malaise/fatigue. Negative for chills, fever and weight loss.  HENT: Negative for congestion, ear discharge and nosebleeds.   Eyes: Negative for blurred vision.  Respiratory: Positive for shortness of breath. Negative for cough, hemoptysis, sputum production and wheezing.   Cardiovascular: Negative for chest pain, palpitations, orthopnea and claudication.  Gastrointestinal: Negative for abdominal pain, blood in stool, constipation, diarrhea, heartburn, melena, nausea and vomiting.  Genitourinary: Negative for dysuria, flank pain, frequency, hematuria and urgency.  Musculoskeletal: Negative for back pain, joint pain and myalgias.  Skin: Negative for rash.  Neurological: Negative for dizziness, tingling, focal weakness, seizures, weakness and headaches.  Endo/Heme/Allergies: Does not bruise/bleed easily.  Psychiatric/Behavioral: Negative for depression and suicidal ideas. The patient does not have insomnia.       Allergies  Allergen Reactions   Codeine Nausea And Vomiting and Other (See Comments)    Pt states when he takes codeine, he throws up blood   Niacin And Related Other (See Comments)    "made his body feel hot & he had a hard time breathing"     Past Medical History:  Diagnosis Date   Anxiety    COPD (chronic obstructive pulmonary disease) ([place])    Coronary artery disease 2006   Acute coronary syndrome in March 2006.    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   CORONARY STENT PLACEMENT  2010   EAR CYST EXCISION Right 10/05/2018   Procedure: Right Partial pinnectomy;  Surgeon: Izora Gala, MD;  Location: [place];  Service: ENT;  Laterality: Right;   GRAFT(S) ANGIOGRAM  10/21/2011   Procedure: GRAFT(S) Cyril Loosen;  Surgeon: Leonie Man, MD;  Location: [facility] CATH LAB;  Service: Cardiovascular;;   LEFT HEART CATHETERIZATION WITH CORONARY ANGIOGRAM N/A 10/21/2011   Procedure: LEFT HEART CATHETERIZATION WITH CORONARY ANGIOGRAM;  Surgeon: Leonie Man, MD;  Location: [facility] CATH LAB;  Service: Cardiovascular;  Laterality: N/A;   LYMPH NODE BIOPSY Right 10/05/2018   Procedure: sentinel NODE BIOPSY with right partial modified neck dissection;  Surgeon: Izora Gala, MD;  Location: [place];  Service: ENT;  Laterality: Right;   PORTA CATH INSERTION N/A 07/12/2019   Procedure: PORTA CATH INSERTION;  Surgeon: Algernon Huxley, MD;  Location: [place] CV LAB;  Service: Cardiovascular;  Laterality: N/A;    Social History   Socioeconomic History   Marital status: Married    Spouse name: JoAnne Crear   Number of children: Not on file   Years of education: Not on file   Highest education level: Not on file  Occupational History   Not on file  Social Needs   Financial resource strain: Not on file   Food insecurity    Worry: Not on file    Inability: Not on file   Transportation needs    Medical: Not on file    Non-medical: Not on file  Tobacco Use   Smoking status: Current Every Day Smoker    Packs/day: 2.00    Years: 50.00    Pack years: 100.00    Types: Cigarettes   Smokeless tobacco: Former Systems developer    Types: Chew    Quit date: 01/23/2013  Substance and Sexual Activity   Alcohol use: No   Drug use: Not Currently   Sexual activity: Not Currently  Lifestyle   Physical activity    Days per week: Not on file     Minutes per session: Not on file   Stress: Not on file  Relationships   Social connections    Talks on phone: Not on file    Gets together: Not on file    Attends religious service: Not on file    Active member of club or organization: Not on file    Attends meetings of clubs or organizations: Not on file    Relationship status: Not on file   Intimate partner violence    Fear of current or ex partner: Not on file    Emotionally abused: Not on file    Physically abused: Not on file    Forced sexual activity: Not on file  Other Topics Concern   Not on file  Social History Narrative   Lives with son and grandson    Family History  Problem Relation Age of Onset   Hypertension Mother    Heart attack Mother  Heart attack Father 74     Current Outpatient Medications:    ALPRAZolam (XANAX) 0.25 MG tablet, Take 0.25 mg by mouth 3 (three) times daily. , Disp: , Rfl:    amLODipine (NORVASC) 10 MG tablet, Take 10 mg by mouth daily., Disp: , Rfl:    Baclofen 5 MG TABS, Take 5 mg by mouth 3 (three) times daily. If 5 mg tid is not working then can increase to 10 mg tid, Disp: 90 tablet, Rfl: 0   busPIRone (BUSPAR) 7.5 MG tablet, Take 1 tablet by mouth 2 (two) times daily., Disp: , Rfl:    dexamethasone (DECADRON) 4 MG tablet, Take 2 tablets (8 mg total) by mouth daily. Start the day after chemotherapy for 1 day., Disp: 30 tablet, Rfl: 1   ezetimibe (ZETIA) 10 MG tablet, TAKE 1 TABLET BY MOUTH EVERY DAY, Disp: 90 tablet, Rfl: 3   famotidine (PEPCID) 20 MG tablet, TAKE 1 TABLET (20 MG TOTAL) BY MOUTH DAILY. NEEDS APPOINTMENT FOR FUTURE REFILLS, Disp: 30 tablet, Rfl: 3   fluconazole (DIFLUCAN) 100 MG tablet, Take 1 tablet (100 mg total) by mouth daily. Take 2 tablets on day 1 and then 1 tablet a day until rx ends, Disp: 6 tablet, Rfl: 0   INCRUSE ELLIPTA 62.5 MCG/INH AEPB, Inhale 1 puff into the lungs daily. , Disp: , Rfl: 2   Ipratropium-Albuterol (COMBIVENT RESPIMAT) 20-100  MCG/ACT AERS respimat, Inhale 1 puff into the lungs every 6 (six) hours., Disp: , Rfl:    isosorbide mononitrate (IMDUR) 60 MG 24 hr tablet, TAKE 1 TABLET BY MOUTH EVERY DAY, Disp: 90 tablet, Rfl: 1   lidocaine-prilocaine (EMLA) cream, Apply to affected area once, Disp: 30 g, Rfl: 3   LORazepam (ATIVAN) 0.5 MG tablet, Take 1 tablet 30 minutes prior to each scan, Disp: 2 tablet, Rfl: 0   losartan-hydrochlorothiazide (HYZAAR) 100-12.5 MG tablet, Take 1 tablet by mouth daily., Disp: 30 tablet, Rfl: 10   magic mouthwash w/lidocaine SOLN, Take 5 mLs by mouth 4 (four) times daily as needed for mouth pain., Disp: 460 mL, Rfl: 0   metFORMIN (GLUCOPHAGE) 500 MG tablet, TAKE 1 TABLET (500 MG TOTAL) BY MOUTH 2 (TWO) TIMES DAILY WITH A MEAL., Disp: 180 tablet, Rfl: 0   nitroGLYCERIN (NITROSTAT) 0.4 MG SL tablet, Place 0.4 mg under the tongue every 5 (five) minutes as needed for chest pain., Disp: , Rfl:    nystatin (MYCOSTATIN) 100000 UNIT/ML suspension, Take 5 mLs (500,000 Units total) by mouth 4 (four) times daily., Disp: 473 mL, Rfl: 0   ondansetron (ZOFRAN) 8 MG tablet, Take 1 tablet (8 mg total) by mouth 2 (two) times daily as needed for refractory nausea / vomiting. Start on day 3 after carboplatin chemo., Disp: 30 tablet, Rfl: 1   prochlorperazine (COMPAZINE) 10 MG tablet, Take 1 tablet (10 mg total) by mouth every 6 (six) hours as needed (Nausea or vomiting)., Disp: 30 tablet, Rfl: 1   ranolazine (RANEXA) 1000 MG SR tablet, TAKE 1 TABLET BY MOUTH TWICE A DAY, Disp: 60 tablet, Rfl: 11   traMADol (ULTRAM) 50 MG tablet, Take 1 tablet (50 mg total) by mouth every 6 (six) hours as needed., Disp: 30 tablet, Rfl: 0   aspirin EC 81 MG tablet, Take 81 mg by mouth daily., Disp: , Rfl:    atorvastatin (LIPITOR) 40 MG tablet, TAKE 1 TABLET (40 MG TOTAL) BY MOUTH AT BEDTIME. KEEP OCTOBER APPOINTMENT (Patient not taking: Reported on 08/09/2019), Disp: 90 tablet, Rfl: 0 No current  facility-administered  medications for this visit.   Facility-Administered Medications Ordered in Other Visits:    dexamethasone (DECADRON) injection 10 mg, 10 mg, Intravenous, Once, Sindy Guadeloupe, MD   etoposide (VEPESID) 200 mg in sodium chloride 0.9 % 500 mL chemo infusion, 100 mg/m2 (Treatment Plan Recorded), Intravenous, Once, Sindy Guadeloupe, MD   heparin lock flush 100 unit/mL, 500 Units, Intravenous, Once, Sindy Guadeloupe, MD   heparin lock flush 100 unit/mL, 500 Units, Intracatheter, Once PRN, Sindy Guadeloupe, MD   sodium chloride flush (NS) 0.9 % injection 10 mL, 10 mL, Intravenous, PRN, Sindy Guadeloupe, MD  Physical exam:  Vitals:   08/10/19 1008  BP: 121/67  Pulse: 81  Temp: (!) 97.3 F (36.3 C)  TempSrc: Tympanic  Weight: 175 lb (79.4 kg)  Height: 5\' 8"  (1.727 m)   Physical Exam Constitutional:      Comments: Appears fatigued  HENT:     Head: Normocephalic and atraumatic.  Eyes:     Pupils: Pupils are equal, round, and reactive to light.  Neck:     Musculoskeletal: Normal range of motion.  Cardiovascular:     Rate and Rhythm: Normal rate and regular rhythm.     Heart sounds: Normal heart sounds.  Pulmonary:     Effort: Pulmonary effort is normal.     Comments: Decreased bilaterally over lung bases Abdominal:     General: Bowel sounds are normal.     Palpations: Abdomen is soft.  Skin:    General: Skin is warm and dry.  Neurological:     Mental Status: He is alert and oriented to person, place, and time.      CMP Latest Ref Rng & Units 08/10/2019  Glucose 70 - 99 mg/dL 137(H)  BUN 8 - 23 mg/dL 12  Creatinine 0.61 - 1.24 mg/dL 1.15  Sodium 135 - 145 mmol/L 135  Potassium 3.5 - 5.1 mmol/L 4.7  Chloride 98 - 111 mmol/L 102  CO2 22 - 32 mmol/L 25  Calcium 8.9 - 10.3 mg/dL 8.7(L)  Total Protein 6.5 - 8.1 g/dL 6.7  Total Bilirubin 0.3 - 1.2 mg/dL 0.5  Alkaline Phos 38 - 126 U/L 84  AST 15 - 41 U/L 13(L)  ALT 0 - 44 U/L 18   CBC Latest Ref Rng & Units 08/10/2019  WBC 4.0  - 10.5 K/uL 15.9(H)  Hemoglobin 13.0 - 17.0 g/dL 9.7(L)  Hematocrit 39.0 - 52.0 % 30.4(L)  Platelets 150 - 400 K/uL 502(H)    No images are attached to the encounter.  Dg Ribs Unilateral Right  Result Date: 07/30/2019 CLINICAL DATA:  Rib pain, history of right lung cancer EXAM: RIGHT RIBS - 2 VIEW COMPARISON:  Chest radiograph dated 06/23/2019 FINDINGS: No fracture or other bone lesions are seen involving the ribs. Median sternotomy wires are noted. A left internal jugular central venous port catheter has tip overlying the left brachiocephalic vein. IMPRESSION: Negative for fracture or other acute bone lesion. No pneumothorax or effusion. Electronically Signed   By: Zerita Boers M.D.   On: 07/30/2019 17:12   Mr Jeri Cos OI Contrast  Result Date: 07/20/2019 CLINICAL DATA:  68 year old male recently diagnosed with lung cancer. EXAM: MRI HEAD WITHOUT AND WITH CONTRAST TECHNIQUE: Multiplanar, multiecho pulse sequences of the brain and surrounding structures were obtained without and with intravenous contrast. CONTRAST:  35mL GADAVIST GADOBUTROL 1 MMOL/ML IV SOLN COMPARISON:  [facility] [facility] head CT without contrast 02/22/2008. FINDINGS: Brain: No abnormal enhancement identified. No midline  shift, mass effect, or evidence of intracranial mass lesion. No dural thickening. Cerebral volume is within normal limits for age. No restricted diffusion to suggest acute infarction. No ventriculomegaly, extra-axial collection or acute intracranial hemorrhage. Cervicomedullary junction and pituitary are within normal limits. Widely scattered small foci of nonspecific cerebral white matter T2 and FLAIR hyperintensity, moderate for age. No cortical encephalomalacia or definite chronic cerebral blood products. The deep gray nuclei, brainstem and cerebellum are within normal limits for age. Vascular: Major intracranial vascular flow voids are preserved. The major dural venous sinuses are enhancing and appear to be  patent. Skull and upper cervical spine: Negative visible cervical spine and spinal cord. Visualized bone marrow signal is within normal limits. Sinuses/Orbits: Postoperative changes to both globes. Right maxillary sinus mucosal thickening or small fluid level. Other: Mastoids are clear. Visible internal auditory structures appear normal. Scalp and face soft tissues appear negative. IMPRESSION: 1.  No metastatic disease or acute intracranial abnormality. 2. Moderate for age nonspecific cerebral white matter signal changes, most commonly due to chronic small vessel disease. Electronically Signed   By: Genevie Ann M.D.   On: 07/20/2019 11:01   Mr Abdomen W Wo Contrast  Result Date: 07/29/2019 CLINICAL DATA:  Right renal mass. Large cell neuroendocrine carcinoma of the lungs. EXAM: MRI ABDOMEN WITHOUT AND WITH CONTRAST TECHNIQUE: Multiplanar multisequence MR imaging of the abdomen was performed both before and after the administration of intravenous contrast. CONTRAST:  68mL GADAVIST GADOBUTROL 1 MMOL/ML IV SOLN COMPARISON:  Multiple exams, including PET-CT from 06/10/2019 FINDINGS: Lower chest: Unremarkable Hepatobiliary: Unremarkable where included. Pancreas:  Unremarkable Spleen:  Unremarkable where included. Adrenals/Urinary Tract: There is dropout of signal in the 2.7 by 2.8 cm left adrenal mass on out of phase images compatible with added, which correlates with the low-density lesion on noncontrast CT. A heterogeneous mass exophytic from the right kidney lower pole measures 10.9 by 9.4 by 9.2 cm (volume = 490 cm^3) on image 18/2. No internal fatty elements identified. Associated moderate restriction of diffusion. Extensive heterogeneous enhancement throughout the mass after contrast administration. I do not appreciate definite renal vein thrombosis. No well-defined retroperitoneal adenopathy. The mass extends to the margins of the perirenal space and abuts the right psoas muscle. No definite psoas muscle invasion.  Left mid kidney and right kidney upper pole Bosniak category 1 cysts. Stomach/Bowel: Unremarkable Vascular/Lymphatic: Aortoiliac atherosclerotic vascular disease. Small thrombosed saccular outpouching of the abdominal aorta at the 4 o'clock position on image 299/12, measuring about 1.0 cm in diameter. No pathologic adenopathy identified. Other:  No supplemental non-categorized findings. Musculoskeletal: Mild lumbar spondylosis and degenerative disc disease. No compelling findings of osseous metastatic disease. IMPRESSION: 1. A 10.9 cm right kidney lower pole heterogeneously enhancing mass is most compatible with renal cell carcinoma. No appreciable pathologic adenopathy or thrombosis of the right renal vein. No compelling findings of distant metastatic disease. 2. Left adrenal adenoma. 3. Aortic Atherosclerosis (ICD10-I70.0). 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 Electronically Signed   By: Van Clines M.D.   On: 07/29/2019 07:57   US Biopsy (kidney)  Result Date: 08/06/2019 INDICATION: 11 cm mass emanating from the lower pole of the right kidney and recent diagnosis of right lung cancer. The patient presents for biopsy of the right renal mass in order to guide therapy. EXAM: ULTRASOUND GUIDED CORE BIOPSY OF RIGHT RENAL MASS MEDICATIONS: None. ANESTHESIA/SEDATION: Fentanyl 100 mcg IV; Versed 2.0 mg IV Moderate  Sedation Time:  19 minutes. The patient was continuously monitored during the procedure by the interventional radiology nurse under my direct supervision. PROCEDURE: The procedure, risks, benefits, and alternatives were explained to the patient. Questions regarding the procedure were encouraged and answered. The patient understands and consents to the procedure. A time-out was performed prior to initiating the procedure. The right kidney was imaged by ultrasound in a prone position. The right flank region was  prepped with chlorhexidine in a sterile fashion, and a sterile drape was applied covering the operative field. A sterile gown and sterile gloves were used for the procedure. Local anesthesia was provided with 1% Lidocaine. Under ultrasound guidance, a 17 gauge trocar needle was advanced to the level of a right lower pole renal mass. Coaxial 18 gauge core biopsy samples were obtained with an automated device. Three separate core biopsy samples were obtained and submitted in formalin. On completion of the procedure Gel-Foam pledgets were advanced through the 17 gauge needle as it was removed. Additional ultrasound was performed. COMPLICATIONS: None immediate. FINDINGS: Ultrasound demonstrates a large lobulated mass emanating from the lower pole of the right kidney measuring approximately 10.8 cm in maximum diameter by ultrasound. Solid tissue was obtained. There were no immediate complications. IMPRESSION: Ultrasound-guided core biopsy performed of a large 11 cm mass emanating from the lower pole of the right kidney. Electronically Signed   By: Aletta Edouard M.D.   On: 08/06/2019 09:48     Assessment and plan- Patient is a 68 y.o. 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  He will return on day 2 and day 3 for etoposide alone.  I will tentatively see him back in 10 days time with CBC with differential and hold tube for possible transfusion.  Patient is frail and at risk for chemotherapy-induced complication  Return to clinic in 3 weeks time with CBC with differential, CMP for cycle 3 of carboplatin and etoposide.  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  This will need nephrectomy upon completion of  chemoradiation   Visit Diagnosis 1. Malignant neoplasm of upper lobe of right lung ([place])   2. Antineoplastic chemotherapy induced anemia   3. Encounter for antineoplastic chemotherapy      Dr. Randa Evens, MD, MPH [facility] at [facility] [phone] 08/12/2019 1:43 PM

## 2019-08-13 ENCOUNTER — Ambulatory Visit
Admission: RE | Admit: 2019-08-13 | Discharge: 2019-08-13 | Disposition: A | Discharge status: Home / Self Care | Payer: Medicare Other | Source: Ambulatory Visit | Attending: Radiation Oncology | Admitting: Radiation Oncology

## 2019-08-13 ENCOUNTER — Other Ambulatory Visit: Payer: Self-pay

## 2019-08-13 DIAGNOSIS — C7A1 Malignant poorly differentiated neuroendocrine tumors: Secondary | ICD-10-CM | POA: Diagnosis not present

## 2019-08-13 DIAGNOSIS — Z510 Encounter for antineoplastic radiation therapy: Secondary | ICD-10-CM | POA: Diagnosis not present

## 2019-08-14 ENCOUNTER — Ambulatory Visit: Payer: Medicare Other

## 2019-08-16 ENCOUNTER — Other Ambulatory Visit: Payer: Self-pay

## 2019-08-16 ENCOUNTER — Ambulatory Visit
Admission: RE | Admit: 2019-08-16 | Discharge: 2019-08-16 | Disposition: A | Discharge status: Home / Self Care | Payer: Medicare Other | Source: Ambulatory Visit | Attending: Radiation Oncology | Admitting: Radiation Oncology

## 2019-08-16 DIAGNOSIS — Z510 Encounter for antineoplastic radiation therapy: Secondary | ICD-10-CM | POA: Insufficient documentation

## 2019-08-16 DIAGNOSIS — C7A1 Malignant poorly differentiated neuroendocrine tumors: Secondary | ICD-10-CM | POA: Insufficient documentation

## 2019-08-17 ENCOUNTER — Other Ambulatory Visit: Payer: Self-pay | Admitting: Oncology

## 2019-08-17 ENCOUNTER — Ambulatory Visit
Admission: RE | Admit: 2019-08-17 | Discharge: 2019-08-17 | Disposition: A | Discharge status: Home / Self Care | Payer: Medicare Other | Source: Ambulatory Visit | Attending: Radiation Oncology | Admitting: Radiation Oncology

## 2019-08-17 ENCOUNTER — Other Ambulatory Visit: Payer: Self-pay | Admitting: Cardiovascular Disease

## 2019-08-17 ENCOUNTER — Other Ambulatory Visit: Payer: Self-pay

## 2019-08-17 DIAGNOSIS — Z510 Encounter for antineoplastic radiation therapy: Secondary | ICD-10-CM | POA: Diagnosis not present

## 2019-08-17 DIAGNOSIS — C7A1 Malignant poorly differentiated neuroendocrine tumors: Secondary | ICD-10-CM | POA: Diagnosis not present

## 2019-08-18 ENCOUNTER — Other Ambulatory Visit: Payer: Self-pay

## 2019-08-18 ENCOUNTER — Ambulatory Visit
Admission: RE | Admit: 2019-08-18 | Discharge: 2019-08-18 | Disposition: A | Discharge status: Home / Self Care | Payer: Medicare Other | Source: Ambulatory Visit | Attending: Radiation Oncology | Admitting: Radiation Oncology

## 2019-08-18 DIAGNOSIS — Z510 Encounter for antineoplastic radiation therapy: Secondary | ICD-10-CM | POA: Diagnosis not present

## 2019-08-18 DIAGNOSIS — C7A1 Malignant poorly differentiated neuroendocrine tumors: Secondary | ICD-10-CM | POA: Diagnosis not present

## 2019-08-19 ENCOUNTER — Ambulatory Visit
Admission: RE | Admit: 2019-08-19 | Discharge: 2019-08-19 | Disposition: A | Discharge status: Home / Self Care | Payer: Medicare Other | Source: Ambulatory Visit | Attending: Radiation Oncology | Admitting: Radiation Oncology

## 2019-08-19 ENCOUNTER — Other Ambulatory Visit: Payer: Self-pay

## 2019-08-19 DIAGNOSIS — C7A1 Malignant poorly differentiated neuroendocrine tumors: Secondary | ICD-10-CM | POA: Diagnosis not present

## 2019-08-19 DIAGNOSIS — Z510 Encounter for antineoplastic radiation therapy: Secondary | ICD-10-CM | POA: Diagnosis not present

## 2019-08-19 NOTE — Progress Notes (Signed)
Patient unable to answer the phone daughter gave information for screening.

## 2019-08-20 ENCOUNTER — Inpatient Hospital Stay: Payer: Medicare Other

## 2019-08-20 ENCOUNTER — Inpatient Hospital Stay (HOSPITAL_BASED_OUTPATIENT_CLINIC_OR_DEPARTMENT_OTHER): Payer: Medicare Other | Admitting: Oncology

## 2019-08-20 ENCOUNTER — Inpatient Hospital Stay: Payer: Medicare Other | Attending: Oncology

## 2019-08-20 ENCOUNTER — Other Ambulatory Visit: Payer: Self-pay

## 2019-08-20 ENCOUNTER — Other Ambulatory Visit: Payer: Self-pay | Admitting: Oncology

## 2019-08-20 ENCOUNTER — Ambulatory Visit
Admission: RE | Admit: 2019-08-20 | Discharge: 2019-08-20 | Disposition: A | Discharge status: Home / Self Care | Payer: Medicare Other | Source: Ambulatory Visit | Attending: Radiation Oncology | Admitting: Radiation Oncology

## 2019-08-20 ENCOUNTER — Encounter: Payer: Self-pay | Admitting: Oncology

## 2019-08-20 ENCOUNTER — Ambulatory Visit
Admission: RE | Admit: 2019-08-20 | Discharge: 2019-08-20 | Disposition: A | Discharge status: Home / Self Care | Payer: Medicare Other | Source: Ambulatory Visit | Attending: Oncology | Admitting: Oncology

## 2019-08-20 VITALS — BP 111/59 | HR 109 | Temp 98.7°F | Resp 22 | Wt 172.5 lb

## 2019-08-20 DIAGNOSIS — G4730 Sleep apnea, unspecified: Secondary | ICD-10-CM | POA: Diagnosis not present

## 2019-08-20 DIAGNOSIS — R5381 Other malaise: Secondary | ICD-10-CM | POA: Diagnosis not present

## 2019-08-20 DIAGNOSIS — C3411 Malignant neoplasm of upper lobe, right bronchus or lung: Secondary | ICD-10-CM

## 2019-08-20 DIAGNOSIS — F17210 Nicotine dependence, cigarettes, uncomplicated: Secondary | ICD-10-CM | POA: Insufficient documentation

## 2019-08-20 DIAGNOSIS — R5383 Other fatigue: Secondary | ICD-10-CM | POA: Insufficient documentation

## 2019-08-20 DIAGNOSIS — E785 Hyperlipidemia, unspecified: Secondary | ICD-10-CM | POA: Insufficient documentation

## 2019-08-20 DIAGNOSIS — D701 Agranulocytosis secondary to cancer chemotherapy: Secondary | ICD-10-CM | POA: Insufficient documentation

## 2019-08-20 DIAGNOSIS — T451X5A Adverse effect of antineoplastic and immunosuppressive drugs, initial encounter: Secondary | ICD-10-CM | POA: Insufficient documentation

## 2019-08-20 DIAGNOSIS — Z95828 Presence of other vascular implants and grafts: Secondary | ICD-10-CM

## 2019-08-20 DIAGNOSIS — E860 Dehydration: Secondary | ICD-10-CM | POA: Insufficient documentation

## 2019-08-20 DIAGNOSIS — C7A090 Malignant carcinoid tumor of the bronchus and lung: Secondary | ICD-10-CM | POA: Diagnosis not present

## 2019-08-20 DIAGNOSIS — Z9989 Dependence on other enabling machines and devices: Secondary | ICD-10-CM | POA: Diagnosis not present

## 2019-08-20 DIAGNOSIS — J449 Chronic obstructive pulmonary disease, unspecified: Secondary | ICD-10-CM | POA: Insufficient documentation

## 2019-08-20 DIAGNOSIS — E119 Type 2 diabetes mellitus without complications: Secondary | ICD-10-CM | POA: Insufficient documentation

## 2019-08-20 DIAGNOSIS — Z85820 Personal history of malignant melanoma of skin: Secondary | ICD-10-CM | POA: Diagnosis not present

## 2019-08-20 DIAGNOSIS — Z5111 Encounter for antineoplastic chemotherapy: Secondary | ICD-10-CM | POA: Insufficient documentation

## 2019-08-20 DIAGNOSIS — Z7982 Long term (current) use of aspirin: Secondary | ICD-10-CM | POA: Insufficient documentation

## 2019-08-20 DIAGNOSIS — Z510 Encounter for antineoplastic radiation therapy: Secondary | ICD-10-CM | POA: Diagnosis not present

## 2019-08-20 DIAGNOSIS — Z452 Encounter for adjustment and management of vascular access device: Secondary | ICD-10-CM

## 2019-08-20 DIAGNOSIS — I10 Essential (primary) hypertension: Secondary | ICD-10-CM | POA: Diagnosis not present

## 2019-08-20 DIAGNOSIS — Z79899 Other long term (current) drug therapy: Secondary | ICD-10-CM | POA: Diagnosis not present

## 2019-08-20 DIAGNOSIS — C7A1 Malignant poorly differentiated neuroendocrine tumors: Secondary | ICD-10-CM | POA: Diagnosis not present

## 2019-08-20 LAB — CBC WITH DIFFERENTIAL/PLATELET
Abs Immature Granulocytes: 0 K/uL (ref 0.00–0.07)
Basophils Absolute: 0 K/uL (ref 0.0–0.1)
Basophils Relative: 5 %
Eosinophils Absolute: 0 K/uL (ref 0.0–0.5)
Eosinophils Relative: 2 %
HCT: 27.7 % — ABNORMAL LOW (ref 39.0–52.0)
Hemoglobin: 9 g/dL — ABNORMAL LOW (ref 13.0–17.0)
Immature Granulocytes: 0 %
Lymphocytes Relative: 60 %
Lymphs Abs: 0.5 K/uL — ABNORMAL LOW (ref 0.7–4.0)
MCH: 28.6 pg (ref 26.0–34.0)
MCHC: 32.5 g/dL (ref 30.0–36.0)
MCV: 87.9 fL (ref 80.0–100.0)
Monocytes Absolute: 0.1 K/uL (ref 0.1–1.0)
Monocytes Relative: 6 %
Neutro Abs: 0.2 K/uL — ABNORMAL LOW (ref 1.7–7.7)
Neutrophils Relative %: 27 %
Platelets: 102 K/uL — ABNORMAL LOW (ref 150–400)
RBC: 3.15 MIL/uL — ABNORMAL LOW (ref 4.22–5.81)
RDW: 13.3 % (ref 11.5–15.5)
Smear Review: DECREASED
WBC Morphology: ABNORMAL
WBC: 0.9 K/uL — CL (ref 4.0–10.5)
nRBC: 0 % (ref 0.0–0.2)

## 2019-08-20 LAB — COMPREHENSIVE METABOLIC PANEL WITH GFR
ALT: 19 U/L (ref 0–44)
AST: 16 U/L (ref 15–41)
Albumin: 3.1 g/dL — ABNORMAL LOW (ref 3.5–5.0)
Alkaline Phosphatase: 78 U/L (ref 38–126)
Anion gap: 8 (ref 5–15)
BUN: 17 mg/dL (ref 8–23)
CO2: 25 mmol/L (ref 22–32)
Calcium: 8.5 mg/dL — ABNORMAL LOW (ref 8.9–10.3)
Chloride: 98 mmol/L (ref 98–111)
Creatinine, Ser: 1.11 mg/dL (ref 0.61–1.24)
GFR calc Af Amer: >60 mL/min
GFR calc non Af Amer: >60 mL/min
Glucose, Bld: 136 mg/dL — ABNORMAL HIGH (ref 70–99)
Potassium: 4.4 mmol/L (ref 3.5–5.1)
Sodium: 131 mmol/L — ABNORMAL LOW (ref 135–145)
Total Bilirubin: 1.1 mg/dL (ref 0.3–1.2)
Total Protein: 6.7 g/dL (ref 6.5–8.1)

## 2019-08-20 LAB — SAMPLE TO BLOOD BANK

## 2019-08-20 MED ORDER — SODIUM CHLORIDE 0.9% FLUSH
10.0000 mL | Freq: Once | INTRAVENOUS | Status: AC
  Administered 2019-08-20: 10 mL via INTRAVENOUS
  Filled 2019-08-20: qty 10

## 2019-08-20 MED ORDER — IOHEXOL 300 MG/ML  SOLN
10.0000 mL | Freq: Once | INTRAMUSCULAR | Status: AC | PRN
  Administered 2019-08-20: 11:00:00 10 mL

## 2019-08-20 MED ORDER — SODIUM CHLORIDE 0.9 % IV SOLN
Freq: Once | INTRAVENOUS | Status: AC
  Administered 2019-08-20: 10:00:00 via INTRAVENOUS
  Filled 2019-08-20: qty 250

## 2019-08-20 MED ORDER — HEPARIN SOD (PORK) LOCK FLUSH 100 UNIT/ML IV SOLN
500.0000 [IU] | Freq: Once | INTRAVENOUS | Status: AC
  Administered 2019-08-20: 500 [IU] via INTRAVENOUS

## 2019-08-20 MED ORDER — HEPARIN SOD (PORK) LOCK FLUSH 100 UNIT/ML IV SOLN
INTRAVENOUS | Status: AC
  Filled 2019-08-20: qty 5

## 2019-08-20 NOTE — Progress Notes (Signed)
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 As a part of his staging work-up he underwent CT chest as well as CT neck. 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 CT neck was unremarkable however CT chest showed a right upper lobe nodule 2.1 x 1.7 cm in with minimal spiculation as well as a 0.5 cm right lower lobe nodule. No mediastinal or hilar adenopathy was noted. Patient was referred to [facility] for further work-up given his preference. He underwent a PET CT scan on 06/10/2019 which showed a hypermetabolic right upper lobe lung nodule measuring 2 cm. Equivocal right infrahilar hypermetabolism with an SUV of 3.1. 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 Large right kidney mass measuring 9.7 x 9 cm. Patient underwent CT-guided lung biopsy which was consistent with large cell neuroendocrine carcinoma.  Concurrent chemoradiation started on 07/20/2019 with carboplatin and etoposide  Interval history-reports feeling fatigued.  Denies any hiccups or chest wall pain.  Appetite is fair and weight has remained stable.  Denies any difficulty swallowing  ECOG PS- 1 Pain scale- 0  Review of systems- Review of Systems    Constitutional: Positive for malaise/fatigue. Negative for chills, fever and weight loss.  HENT: Negative for congestion, ear discharge and nosebleeds.   Eyes: Negative for blurred vision.  Respiratory: Negative for cough, hemoptysis, sputum production, shortness of breath and wheezing.   Cardiovascular: Negative for chest pain, palpitations, orthopnea and claudication.  Gastrointestinal: Negative for abdominal pain, blood in stool, constipation, diarrhea, heartburn, melena, nausea and vomiting.  Genitourinary: Negative for dysuria, flank pain, frequency, hematuria and urgency.  Musculoskeletal: Negative for back pain, joint pain and myalgias.  Skin: Negative for rash.  Neurological: Negative for dizziness, tingling, focal weakness, seizures, weakness and headaches.  Endo/Heme/Allergies: Does not bruise/bleed easily.  Psychiatric/Behavioral: Negative for depression and suicidal ideas. The patient does not have insomnia.       Allergies  Allergen Reactions   Codeine Nausea And Vomiting and Other (See Comments)    Pt states when he takes codeine, he throws up blood   Niacin And Related Other (See Comments)    "made his body feel hot & he had a hard time breathing"     Past Medical History:  Diagnosis Date   Anxiety    COPD (chronic obstructive pulmonary disease) ([place])    Coronary artery disease 2006   Acute coronary syndrome in March 2006.    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    CORONARY STENT PLACEMENT  2010   EAR CYST EXCISION Right 10/05/2018   Procedure: Right Partial pinnectomy;  Surgeon: Izora Gala, MD;  Location: [place];  Service: ENT;  Laterality: Right;   GRAFT(S) ANGIOGRAM  10/21/2011   Procedure: GRAFT(S) Cyril Loosen;  Surgeon: Leonie Man, MD;  Location: [facility] CATH LAB;  Service: Cardiovascular;;   LEFT HEART CATHETERIZATION WITH CORONARY ANGIOGRAM N/A 10/21/2011   Procedure: LEFT HEART CATHETERIZATION WITH CORONARY ANGIOGRAM;  Surgeon: Leonie Man, MD;  Location: [facility] CATH LAB;  Service: Cardiovascular;  Laterality: N/A;   LYMPH NODE BIOPSY Right 10/05/2018   Procedure: sentinel NODE BIOPSY with right partial modified neck dissection;  Surgeon: Izora Gala, MD;  Location: [place];  Service: ENT;  Laterality: Right;   PORTA CATH INSERTION N/A 07/12/2019   Procedure: PORTA CATH INSERTION;  Surgeon: Algernon Huxley, MD;  Location: [place] [facility];  Service: Cardiovascular;  Laterality: N/A;    Social History   Socioeconomic History   Marital status: Married    Spouse name: JoAnne Mcneece   Number of children: Not on file   Years of education: Not on file   Highest education level: Not on file  Occupational History   Not on file  Social Needs   Financial resource strain: Not on file   Food insecurity    Worry: Not on file    Inability: Not on file   Transportation needs    Medical: Not on file    Non-medical: Not on file  Tobacco Use   Smoking status: Current Every Day Smoker    Packs/day: 2.00    Years: 50.00    Pack years: 100.00    Types: Cigarettes   Smokeless tobacco: Former Systems developer    Types: Chew    Quit date: 01/23/2013  Substance and Sexual Activity   Alcohol use: No   Drug use: Not Currently   Sexual activity: Not Currently  Lifestyle   Physical activity    Days per week: Not on file    Minutes per session: Not on file   Stress: Not on file  Relationships   Social connections    Talks on phone: Not on  file    Gets together: Not on file    Attends religious service: Not on file    Active member of club or organization: Not on file    Attends meetings of clubs or organizations: Not on file    Relationship status: Not on file   Intimate partner violence    Fear of current or ex partner: Not on file    Emotionally abused: Not on file    Physically abused: Not on file    Forced sexual activity: Not on file  Other Topics Concern   Not on file  Social History Narrative   Lives with son and grandson    Family History  Problem Relation Age of Onset   Hypertension Mother    Heart attack Mother    Heart attack Father 86     Current Outpatient Medications:    ALPRAZolam (XANAX) 0.25 MG tablet, Take 0.25 mg  by mouth 3 (three) times daily. , Disp: , Rfl:    amLODipine (NORVASC) 10 MG tablet, Take 10 mg by mouth daily., Disp: , Rfl:    aspirin EC 81 MG tablet, Take 81 mg by mouth daily., Disp: , Rfl:    atorvastatin (LIPITOR) 40 MG tablet, TAKE 1 TABLET (40 MG TOTAL) BY MOUTH AT BEDTIME. KEEP OCTOBER APPOINTMENT, Disp: 90 tablet, Rfl: 0   Baclofen 5 MG TABS, Take 5 mg by mouth 3 (three) times daily. If 5 mg tid is not working then can increase to 10 mg tid, Disp: 90 tablet, Rfl: 0   busPIRone (BUSPAR) 7.5 MG tablet, Take 1 tablet by mouth 2 (two) times daily., Disp: , Rfl:    dexamethasone (DECADRON) 4 MG tablet, Take 2 tablets (8 mg total) by mouth daily. Start the day after chemotherapy for 1 day., Disp: 30 tablet, Rfl: 1   ezetimibe (ZETIA) 10 MG tablet, TAKE 1 TABLET BY MOUTH EVERY DAY, Disp: 90 tablet, Rfl: 3   famotidine (PEPCID) 20 MG tablet, TAKE 1 TABLET (20 MG TOTAL) BY MOUTH DAILY. NEEDS APPOINTMENT FOR FUTURE REFILLS, Disp: 30 tablet, Rfl: 3   fluconazole (DIFLUCAN) 100 MG tablet, Take 1 tablet (100 mg total) by mouth daily. Take 2 tablets on day 1 and then 1 tablet a day until rx ends, Disp: 6 tablet, Rfl: 0   INCRUSE ELLIPTA 62.5 MCG/INH AEPB, Inhale 1 puff into  the lungs daily. , Disp: , Rfl: 2   Ipratropium-Albuterol (COMBIVENT RESPIMAT) 20-100 MCG/ACT AERS respimat, Inhale 1 puff into the lungs every 6 (six) hours., Disp: , Rfl:    isosorbide mononitrate (IMDUR) 60 MG 24 hr tablet, TAKE 1 TABLET BY MOUTH EVERY DAY, Disp: 90 tablet, Rfl: 1   lidocaine-prilocaine (EMLA) cream, Apply to affected area once, Disp: 30 g, Rfl: 3   LORazepam (ATIVAN) 0.5 MG tablet, Take 1 tablet 30 minutes prior to each scan, Disp: 2 tablet, Rfl: 0   losartan-hydrochlorothiazide (HYZAAR) 100-12.5 MG tablet, Take 1 tablet by mouth daily., Disp: 30 tablet, Rfl: 10   magic mouthwash w/lidocaine SOLN, Take 5 mLs by mouth 4 (four) times daily as needed for mouth pain., Disp: 460 mL, Rfl: 0   metFORMIN (GLUCOPHAGE) 500 MG tablet, TAKE 1 TABLET (500 MG TOTAL) BY MOUTH 2 (TWO) TIMES DAILY WITH A MEAL., Disp: 180 tablet, Rfl: 0   nitroGLYCERIN (NITROSTAT) 0.4 MG SL tablet, Place 0.4 mg under the tongue every 5 (five) minutes as needed for chest pain., Disp: , Rfl:    nystatin (MYCOSTATIN) 100000 UNIT/ML suspension, Take 5 mLs (500,000 Units total) by mouth 4 (four) times daily., Disp: 473 mL, Rfl: 0   ondansetron (ZOFRAN) 8 MG tablet, Take 1 tablet (8 mg total) by mouth 2 (two) times daily as needed for refractory nausea / vomiting. Start on day 3 after carboplatin chemo., Disp: 30 tablet, Rfl: 1   prochlorperazine (COMPAZINE) 10 MG tablet, Take 1 tablet (10 mg total) by mouth every 6 (six) hours as needed (Nausea or vomiting)., Disp: 30 tablet, Rfl: 1   ranolazine (RANEXA) 1000 MG SR tablet, TAKE 1 TABLET BY MOUTH TWICE A DAY, Disp: 60 tablet, Rfl: 11   traMADol (ULTRAM) 50 MG tablet, Take 1 tablet (50 mg total) by mouth every 6 (six) hours as needed., Disp: 30 tablet, Rfl: 0 No current facility-administered medications for this visit.   Facility-Administered Medications Ordered in Other Visits:    heparin lock flush 100 unit/mL, 500 Units, Intravenous, Once, Randa Evens  C, MD   sodium chloride flush (NS) 0.9 % injection 10 mL, 10 mL, Intravenous, PRN, Sindy Guadeloupe, MD  Physical exam:  Vitals:   08/20/19 0846  BP: (!) 111/59  Pulse: (!) 109  Resp: (!) 22  Temp: 98.7 F (37.1 C)  TempSrc: Temporal  SpO2: 97%  Weight: 172 lb 8 oz (78.2 kg)   Physical Exam HENT:     Head: Normocephalic and atraumatic.     Mouth/Throat:     Mouth: Mucous membranes are moist.  Eyes:     Pupils: Pupils are equal, round, and reactive to light.  Neck:     Musculoskeletal: Normal range of motion.  Cardiovascular:     Rate and Rhythm: Regular rhythm. Tachycardia present.     Heart sounds: Normal heart sounds.  Pulmonary:     Effort: Pulmonary effort is normal.     Breath sounds: Normal breath sounds.  Abdominal:     General: Bowel sounds are normal.     Palpations: Abdomen is soft.  Skin:    General: Skin is warm and dry.  Neurological:     Mental Status: He is alert and oriented to person, place, and time.      CMP Latest Ref Rng & Units 08/20/2019  Glucose 70 - 99 mg/dL 136(H)  BUN 8 - 23 mg/dL 17  Creatinine 0.61 - 1.24 mg/dL 1.11  Sodium 135 - 145 mmol/L 131(L)  Potassium 3.5 - 5.1 mmol/L 4.4  Chloride 98 - 111 mmol/L 98  CO2 22 - 32 mmol/L 25  Calcium 8.9 - 10.3 mg/dL 8.5(L)  Total Protein 6.5 - 8.1 g/dL 6.7  Total Bilirubin 0.3 - 1.2 mg/dL 1.1  Alkaline Phos 38 - 126 U/L 78  AST 15 - 41 U/L 16  ALT 0 - 44 U/L 19   CBC Latest Ref Rng & Units 08/20/2019  WBC 4.0 - 10.5 K/uL 0.9(LL)  Hemoglobin 13.0 - 17.0 g/dL 9.0(L)  Hematocrit 39.0 - 52.0 % 27.7(L)  Platelets 150 - 400 K/uL 102(L)    No images are attached to the encounter.  Dg Ribs Unilateral Right  Result Date: 07/30/2019 CLINICAL DATA:  Rib pain, history of right lung cancer EXAM: RIGHT RIBS - 2 VIEW COMPARISON:  Chest radiograph dated 06/23/2019 FINDINGS: No fracture or other bone lesions are seen involving the ribs. Median sternotomy wires are noted. A left internal jugular central  venous port catheter has tip overlying the left brachiocephalic vein. IMPRESSION: Negative for fracture or other acute bone lesion. No pneumothorax or effusion. Electronically Signed   By: Zerita Boers M.D.   On: 07/30/2019 17:12   Mr Abdomen W Wo Contrast  Result Date: 07/29/2019 CLINICAL DATA:  Right renal mass. Large cell neuroendocrine carcinoma of the lungs. EXAM: MRI ABDOMEN WITHOUT AND WITH CONTRAST TECHNIQUE: Multiplanar multisequence MR imaging of the abdomen was performed both before and after the administration of intravenous contrast. CONTRAST:  42mL GADAVIST GADOBUTROL 1 MMOL/ML IV SOLN COMPARISON:  Multiple exams, including PET-CT from 06/10/2019 FINDINGS: Lower chest: Unremarkable Hepatobiliary: Unremarkable where included. Pancreas:  Unremarkable Spleen:  Unremarkable where included. Adrenals/Urinary Tract: There is dropout of signal in the 2.7 by 2.8 cm left adrenal mass on out of phase images compatible with added, which correlates with the low-density lesion on noncontrast CT. A heterogeneous mass exophytic from the right kidney lower pole measures 10.9 by 9.4 by 9.2 cm (volume = 490 cm^3) on image 18/2. No internal fatty elements identified. Associated moderate restriction of diffusion. Extensive  heterogeneous enhancement throughout the mass after contrast administration. I do not appreciate definite renal vein thrombosis. No well-defined retroperitoneal adenopathy. The mass extends to the margins of the perirenal space and abuts the right psoas muscle. No definite psoas muscle invasion. Left mid kidney and right kidney upper pole Bosniak category 1 cysts. Stomach/Bowel: Unremarkable Vascular/Lymphatic: Aortoiliac atherosclerotic vascular disease. Small thrombosed saccular outpouching of the abdominal aorta at the 4 o'clock position on image 299/12, measuring about 1.0 cm in diameter. No pathologic adenopathy identified. Other:  No supplemental non-categorized findings. Musculoskeletal: Mild  lumbar spondylosis and degenerative disc disease. No compelling findings of osseous metastatic disease. IMPRESSION: 1. A 10.9 cm right kidney lower pole heterogeneously enhancing mass is most compatible with renal cell carcinoma. No appreciable pathologic adenopathy or thrombosis of the right renal vein. No compelling findings of distant metastatic disease. 2. Left adrenal adenoma. 3. Aortic Atherosclerosis (ICD10-I70.0). 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 Electronically Signed   By: Van Clines M.D.   On: 07/29/2019 07:57   Dg Fluoro Guide Cv Line Left  Result Date: 08/20/2019 INDICATION: Inability to obtain blood return.  Evaluate for patency. EXAM: Port-A-Cath check. MEDICATIONS: None. ANESTHESIA/SEDATION: None. FLUOROSCOPY TIME:  Fluoroscopy Time: 0 minutes minutes 30 seconds (20.20 mGy). COMPLICATIONS: None immediate. PROCEDURE: Through the existing port 10 cc of Omnipaque 300 was administered. No evidence of catheter fracture. Catheter tip is positioned in the superior vena cava. The port is widely patent. Small fibrin sheath cannot be completely excluded. IMPRESSION: Port-A-Cath widely patent with tip in superior vena cava. A small fibrin sheath cannot be excluded. Electronically Signed   By: Marcello Moores  Register   On: 08/20/2019 10:41   US Biopsy (kidney)  Result Date: 08/06/2019 INDICATION: 11 cm mass emanating from the lower pole of the right kidney and recent diagnosis of right lung cancer. The patient presents for biopsy of the right renal mass in order to guide therapy. EXAM: ULTRASOUND GUIDED CORE BIOPSY OF RIGHT RENAL MASS MEDICATIONS: None. ANESTHESIA/SEDATION: Fentanyl 100 mcg IV; Versed 2.0 mg IV Moderate Sedation Time:  19 minutes. The patient was continuously monitored during the procedure by the interventional radiology nurse under my direct supervision. PROCEDURE: The procedure, risks,  benefits, and alternatives were explained to the patient. Questions regarding the procedure were encouraged and answered. The patient understands and consents to the procedure. A time-out was performed prior to initiating the procedure. The right kidney was imaged by ultrasound in a prone position. The right flank region was prepped with chlorhexidine in a sterile fashion, and a sterile drape was applied covering the operative field. A sterile gown and sterile gloves were used for the procedure. Local anesthesia was provided with 1% Lidocaine. Under ultrasound guidance, a 17 gauge trocar needle was advanced to the level of a right lower pole renal mass. Coaxial 18 gauge core biopsy samples were obtained with an automated device. Three separate core biopsy samples were obtained and submitted in formalin. On completion of the procedure Gel-Foam pledgets were advanced through the 17 gauge needle as it was removed. Additional ultrasound was performed. COMPLICATIONS: None immediate. FINDINGS: Ultrasound demonstrates a large lobulated mass emanating from the lower pole of the right kidney measuring approximately 10.8 cm in maximum diameter by ultrasound. Solid tissue was obtained. There were no immediate complications. IMPRESSION: Ultrasound-guided core biopsy performed of a large 11 cm mass emanating from the lower pole of the right kidney. Electronically Signed  By: Aletta Edouard M.D.   On: 08/06/2019 09:48     Assessment and plan- Patient is a 68 y.o. male with stage I large cell neuroendocrine tumor of the right lung s/p 2 cycles of carboplatin and etoposide chemotherapy here for routine follow-up  Patient is significantly neutropenic today with a white count of 0.9 and ANC of 0.3.  This would be the expected course a week to 10 days after his chemotherapy since he could not receive Neulasta due to concurrent radiation.  Continue to monitor no clinical signs and symptoms of infection  He does appear  dehydrated today and I will give him 1 L of IV fluids today  He will return to clinic in 10 days time with CBC with differential, CMP for cycle 3 of carboplatin and etoposide chemotherapy. Plan is to complete 4 cycles  We will get dye study due to concerns of port not flushing   Visit Diagnosis 1. Encounter for central line care   2. Chemotherapy induced neutropenia (HCC)   3. Dehydration      Dr. Randa Evens, MD, MPH [facility] at [facility] [phone] 08/20/2019 1:32 PM

## 2019-08-23 ENCOUNTER — Ambulatory Visit
Admission: RE | Admit: 2019-08-23 | Discharge: 2019-08-23 | Disposition: A | Discharge status: Home / Self Care | Payer: Medicare Other | Source: Ambulatory Visit | Attending: Radiation Oncology | Admitting: Radiation Oncology

## 2019-08-23 ENCOUNTER — Other Ambulatory Visit: Payer: Self-pay

## 2019-08-23 DIAGNOSIS — Z510 Encounter for antineoplastic radiation therapy: Secondary | ICD-10-CM | POA: Diagnosis not present

## 2019-08-23 DIAGNOSIS — C7A1 Malignant poorly differentiated neuroendocrine tumors: Secondary | ICD-10-CM | POA: Diagnosis not present

## 2019-08-24 ENCOUNTER — Other Ambulatory Visit: Payer: Self-pay

## 2019-08-24 ENCOUNTER — Ambulatory Visit
Admission: RE | Admit: 2019-08-24 | Discharge: 2019-08-24 | Disposition: A | Discharge status: Home / Self Care | Payer: Medicare Other | Source: Ambulatory Visit | Attending: Radiation Oncology | Admitting: Radiation Oncology

## 2019-08-24 DIAGNOSIS — Z510 Encounter for antineoplastic radiation therapy: Secondary | ICD-10-CM | POA: Diagnosis not present

## 2019-08-24 DIAGNOSIS — C7A1 Malignant poorly differentiated neuroendocrine tumors: Secondary | ICD-10-CM | POA: Diagnosis not present

## 2019-08-25 ENCOUNTER — Ambulatory Visit
Admission: RE | Admit: 2019-08-25 | Discharge: 2019-08-25 | Disposition: A | Discharge status: Home / Self Care | Payer: Medicare Other | Source: Ambulatory Visit | Attending: Radiation Oncology | Admitting: Radiation Oncology

## 2019-08-25 ENCOUNTER — Other Ambulatory Visit: Payer: Self-pay

## 2019-08-25 DIAGNOSIS — Z510 Encounter for antineoplastic radiation therapy: Secondary | ICD-10-CM | POA: Diagnosis not present

## 2019-08-25 DIAGNOSIS — C7A1 Malignant poorly differentiated neuroendocrine tumors: Secondary | ICD-10-CM | POA: Diagnosis not present

## 2019-08-26 ENCOUNTER — Other Ambulatory Visit: Payer: Self-pay

## 2019-08-26 ENCOUNTER — Ambulatory Visit
Admission: RE | Admit: 2019-08-26 | Discharge: 2019-08-26 | Disposition: A | Discharge status: Home / Self Care | Payer: Medicare Other | Source: Ambulatory Visit | Attending: Radiation Oncology | Admitting: Radiation Oncology

## 2019-08-26 DIAGNOSIS — Z510 Encounter for antineoplastic radiation therapy: Secondary | ICD-10-CM | POA: Diagnosis not present

## 2019-08-26 DIAGNOSIS — C7A1 Malignant poorly differentiated neuroendocrine tumors: Secondary | ICD-10-CM | POA: Diagnosis not present

## 2019-08-27 ENCOUNTER — Other Ambulatory Visit: Payer: Self-pay

## 2019-08-27 ENCOUNTER — Ambulatory Visit
Admission: RE | Admit: 2019-08-27 | Discharge: 2019-08-27 | Disposition: A | Discharge status: Home / Self Care | Payer: Medicare Other | Source: Ambulatory Visit | Attending: Radiation Oncology | Admitting: Radiation Oncology

## 2019-08-27 DIAGNOSIS — C7A1 Malignant poorly differentiated neuroendocrine tumors: Secondary | ICD-10-CM | POA: Diagnosis not present

## 2019-08-27 DIAGNOSIS — Z510 Encounter for antineoplastic radiation therapy: Secondary | ICD-10-CM | POA: Diagnosis not present

## 2019-08-30 ENCOUNTER — Ambulatory Visit
Admission: RE | Admit: 2019-08-30 | Discharge: 2019-08-30 | Disposition: A | Discharge status: Home / Self Care | Payer: Medicare Other | Source: Ambulatory Visit | Attending: Radiation Oncology | Admitting: Radiation Oncology

## 2019-08-30 ENCOUNTER — Other Ambulatory Visit: Payer: Self-pay

## 2019-08-30 ENCOUNTER — Ambulatory Visit: Payer: Medicare Other

## 2019-08-30 DIAGNOSIS — Z510 Encounter for antineoplastic radiation therapy: Secondary | ICD-10-CM | POA: Diagnosis not present

## 2019-08-30 DIAGNOSIS — C7A1 Malignant poorly differentiated neuroendocrine tumors: Secondary | ICD-10-CM | POA: Diagnosis not present

## 2019-08-31 ENCOUNTER — Ambulatory Visit: Payer: Medicare Other

## 2019-08-31 ENCOUNTER — Ambulatory Visit
Admission: RE | Admit: 2019-08-31 | Discharge: 2019-08-31 | Disposition: A | Discharge status: Home / Self Care | Payer: Medicare Other | Source: Ambulatory Visit | Attending: Radiation Oncology | Admitting: Radiation Oncology

## 2019-08-31 ENCOUNTER — Ambulatory Visit: Payer: Medicare Other | Admitting: Oncology

## 2019-08-31 ENCOUNTER — Inpatient Hospital Stay: Payer: Medicare Other

## 2019-08-31 ENCOUNTER — Other Ambulatory Visit: Payer: Self-pay

## 2019-08-31 ENCOUNTER — Encounter: Payer: Self-pay | Admitting: Oncology

## 2019-08-31 ENCOUNTER — Inpatient Hospital Stay (HOSPITAL_BASED_OUTPATIENT_CLINIC_OR_DEPARTMENT_OTHER): Payer: Medicare Other | Admitting: Oncology

## 2019-08-31 ENCOUNTER — Other Ambulatory Visit: Payer: Medicare Other

## 2019-08-31 VITALS — BP 127/69 | HR 93 | Temp 97.2°F | Wt 171.0 lb

## 2019-08-31 DIAGNOSIS — D701 Agranulocytosis secondary to cancer chemotherapy: Secondary | ICD-10-CM | POA: Diagnosis not present

## 2019-08-31 DIAGNOSIS — Z7982 Long term (current) use of aspirin: Secondary | ICD-10-CM | POA: Diagnosis not present

## 2019-08-31 DIAGNOSIS — T451X5A Adverse effect of antineoplastic and immunosuppressive drugs, initial encounter: Secondary | ICD-10-CM | POA: Diagnosis not present

## 2019-08-31 DIAGNOSIS — D649 Anemia, unspecified: Secondary | ICD-10-CM

## 2019-08-31 DIAGNOSIS — Z5111 Encounter for antineoplastic chemotherapy: Secondary | ICD-10-CM

## 2019-08-31 DIAGNOSIS — Z79899 Other long term (current) drug therapy: Secondary | ICD-10-CM | POA: Diagnosis not present

## 2019-08-31 DIAGNOSIS — C7A090 Malignant carcinoid tumor of the bronchus and lung: Secondary | ICD-10-CM | POA: Diagnosis not present

## 2019-08-31 DIAGNOSIS — D6481 Anemia due to antineoplastic chemotherapy: Secondary | ICD-10-CM

## 2019-08-31 DIAGNOSIS — R5383 Other fatigue: Secondary | ICD-10-CM | POA: Diagnosis not present

## 2019-08-31 DIAGNOSIS — C7A1 Malignant poorly differentiated neuroendocrine tumors: Secondary | ICD-10-CM | POA: Diagnosis not present

## 2019-08-31 DIAGNOSIS — J449 Chronic obstructive pulmonary disease, unspecified: Secondary | ICD-10-CM | POA: Diagnosis not present

## 2019-08-31 DIAGNOSIS — G4730 Sleep apnea, unspecified: Secondary | ICD-10-CM | POA: Diagnosis not present

## 2019-08-31 DIAGNOSIS — R5381 Other malaise: Secondary | ICD-10-CM | POA: Diagnosis not present

## 2019-08-31 DIAGNOSIS — E860 Dehydration: Secondary | ICD-10-CM | POA: Diagnosis not present

## 2019-08-31 DIAGNOSIS — Z9989 Dependence on other enabling machines and devices: Secondary | ICD-10-CM | POA: Diagnosis not present

## 2019-08-31 DIAGNOSIS — E785 Hyperlipidemia, unspecified: Secondary | ICD-10-CM | POA: Diagnosis not present

## 2019-08-31 DIAGNOSIS — Z85820 Personal history of malignant melanoma of skin: Secondary | ICD-10-CM | POA: Diagnosis not present

## 2019-08-31 DIAGNOSIS — C3411 Malignant neoplasm of upper lobe, right bronchus or lung: Secondary | ICD-10-CM | POA: Diagnosis not present

## 2019-08-31 DIAGNOSIS — Z510 Encounter for antineoplastic radiation therapy: Secondary | ICD-10-CM | POA: Diagnosis not present

## 2019-08-31 DIAGNOSIS — I10 Essential (primary) hypertension: Secondary | ICD-10-CM | POA: Diagnosis not present

## 2019-08-31 DIAGNOSIS — E119 Type 2 diabetes mellitus without complications: Secondary | ICD-10-CM | POA: Diagnosis not present

## 2019-08-31 LAB — CBC WITH DIFFERENTIAL/PLATELET
Abs Immature Granulocytes: 0.18 10*3/uL — ABNORMAL HIGH (ref 0.00–0.07)
Basophils Absolute: 0 10*3/uL (ref 0.0–0.1)
Basophils Relative: 0 %
Eosinophils Absolute: 0 10*3/uL (ref 0.0–0.5)
Eosinophils Relative: 0 %
HCT: 26.5 % — ABNORMAL LOW (ref 39.0–52.0)
Hemoglobin: 8.6 g/dL — ABNORMAL LOW (ref 13.0–17.0)
Immature Granulocytes: 2 %
Lymphocytes Relative: 7 %
Lymphs Abs: 0.8 10*3/uL (ref 0.7–4.0)
MCH: 28.7 pg (ref 26.0–34.0)
MCHC: 32.5 g/dL (ref 30.0–36.0)
MCV: 88.3 fL (ref 80.0–100.0)
Monocytes Absolute: 0.7 10*3/uL (ref 0.1–1.0)
Monocytes Relative: 6 %
Neutro Abs: 10 10*3/uL — ABNORMAL HIGH (ref 1.7–7.7)
Neutrophils Relative %: 85 %
Platelets: 215 10*3/uL (ref 150–400)
RBC: 3 MIL/uL — ABNORMAL LOW (ref 4.22–5.81)
RDW: 15.2 % (ref 11.5–15.5)
WBC: 11.7 10*3/uL — ABNORMAL HIGH (ref 4.0–10.5)
nRBC: 0 % (ref 0.0–0.2)

## 2019-08-31 LAB — COMPREHENSIVE METABOLIC PANEL
ALT: 14 U/L (ref 0–44)
AST: 15 U/L (ref 15–41)
Albumin: 2.9 g/dL — ABNORMAL LOW (ref 3.5–5.0)
Alkaline Phosphatase: 83 U/L (ref 38–126)
Anion gap: 7 (ref 5–15)
BUN: 13 mg/dL (ref 8–23)
CO2: 23 mmol/L (ref 22–32)
Calcium: 8.4 mg/dL — ABNORMAL LOW (ref 8.9–10.3)
Chloride: 100 mmol/L (ref 98–111)
Creatinine, Ser: 1.26 mg/dL — ABNORMAL HIGH (ref 0.61–1.24)
GFR calc Af Amer: >60 mL/min (ref >60)
GFR calc non Af Amer: 57 mL/min — ABNORMAL LOW (ref >60)
Glucose, Bld: 306 mg/dL — ABNORMAL HIGH (ref 70–99)
Potassium: 4.2 mmol/L (ref 3.5–5.1)
Sodium: 130 mmol/L — ABNORMAL LOW (ref 135–145)
Total Bilirubin: 0.5 mg/dL (ref 0.3–1.2)
Total Protein: 6.5 g/dL (ref 6.5–8.1)

## 2019-08-31 LAB — IRON AND TIBC
Iron: 49 ug/dL (ref 45–182)
Saturation Ratios: 24 % (ref 17.9–39.5)
TIBC: 204 ug/dL — ABNORMAL LOW (ref 250–450)
UIBC: 155 ug/dL

## 2019-08-31 LAB — FERRITIN: Ferritin: 418 ng/mL — ABNORMAL HIGH (ref 24–336)

## 2019-08-31 LAB — FOLATE: Folate: 8 ng/mL (ref >5.9)

## 2019-08-31 MED ORDER — HEPARIN SOD (PORK) LOCK FLUSH 100 UNIT/ML IV SOLN
500.0000 [IU] | Freq: Once | INTRAVENOUS | Status: AC | PRN
  Administered 2019-08-31: 500 [IU]
  Filled 2019-08-31: qty 5

## 2019-08-31 MED ORDER — SODIUM CHLORIDE 0.9 % IV SOLN
Freq: Once | INTRAVENOUS | Status: AC
  Administered 2019-08-31: 12:00:00 via INTRAVENOUS
  Filled 2019-08-31: qty 250

## 2019-08-31 MED ORDER — SODIUM CHLORIDE 0.9 % IV SOLN
353.6000 mg | Freq: Once | INTRAVENOUS | Status: AC
  Administered 2019-08-31: 13:00:00 350 mg via INTRAVENOUS
  Filled 2019-08-31: qty 35

## 2019-08-31 MED ORDER — SODIUM CHLORIDE 0.9 % IV SOLN
100.0000 mg/m2 | Freq: Once | INTRAVENOUS | Status: AC
  Administered 2019-08-31: 200 mg via INTRAVENOUS
  Filled 2019-08-31: qty 10

## 2019-08-31 MED ORDER — SODIUM CHLORIDE 0.9 % IV SOLN
Freq: Once | INTRAVENOUS | Status: AC
  Administered 2019-08-31: 12:00:00 via INTRAVENOUS
  Filled 2019-08-31: qty 5

## 2019-08-31 MED ORDER — PALONOSETRON HCL INJECTION 0.25 MG/5ML
0.2500 mg | Freq: Once | INTRAVENOUS | Status: AC
  Administered 2019-08-31: 12:00:00 0.25 mg via INTRAVENOUS
  Filled 2019-08-31: qty 5

## 2019-08-31 MED ORDER — SODIUM CHLORIDE 0.9% FLUSH
10.0000 mL | INTRAVENOUS | Status: AC | PRN
  Administered 2019-08-31: 10 mL via INTRAVENOUS
  Filled 2019-08-31: qty 10

## 2019-08-31 NOTE — Progress Notes (Signed)
Patient stated that he continues to have soreness on his gums. Patient stated that he is using his mouth wash. Patient also stated that his lips are healing as well.

## 2019-09-01 ENCOUNTER — Ambulatory Visit
Admission: RE | Admit: 2019-09-01 | Discharge: 2019-09-01 | Disposition: A | Discharge status: Home / Self Care | Payer: Medicare Other | Source: Ambulatory Visit | Attending: Radiation Oncology | Admitting: Radiation Oncology

## 2019-09-01 ENCOUNTER — Ambulatory Visit: Payer: Medicare Other

## 2019-09-01 ENCOUNTER — Other Ambulatory Visit: Payer: Self-pay | Admitting: *Deleted

## 2019-09-01 ENCOUNTER — Other Ambulatory Visit: Payer: Self-pay

## 2019-09-01 ENCOUNTER — Encounter: Payer: Self-pay | Admitting: *Deleted

## 2019-09-01 ENCOUNTER — Inpatient Hospital Stay: Payer: Medicare Other

## 2019-09-01 VITALS — BP 130/73 | HR 99 | Temp 97.4°F | Resp 20

## 2019-09-01 DIAGNOSIS — Z85820 Personal history of malignant melanoma of skin: Secondary | ICD-10-CM | POA: Diagnosis not present

## 2019-09-01 DIAGNOSIS — E119 Type 2 diabetes mellitus without complications: Secondary | ICD-10-CM | POA: Diagnosis not present

## 2019-09-01 DIAGNOSIS — D701 Agranulocytosis secondary to cancer chemotherapy: Secondary | ICD-10-CM | POA: Diagnosis not present

## 2019-09-01 DIAGNOSIS — I10 Essential (primary) hypertension: Secondary | ICD-10-CM | POA: Diagnosis not present

## 2019-09-01 DIAGNOSIS — G4730 Sleep apnea, unspecified: Secondary | ICD-10-CM | POA: Diagnosis not present

## 2019-09-01 DIAGNOSIS — Z5111 Encounter for antineoplastic chemotherapy: Secondary | ICD-10-CM | POA: Diagnosis not present

## 2019-09-01 DIAGNOSIS — C7A090 Malignant carcinoid tumor of the bronchus and lung: Secondary | ICD-10-CM | POA: Diagnosis not present

## 2019-09-01 DIAGNOSIS — E785 Hyperlipidemia, unspecified: Secondary | ICD-10-CM | POA: Diagnosis not present

## 2019-09-01 DIAGNOSIS — C3411 Malignant neoplasm of upper lobe, right bronchus or lung: Secondary | ICD-10-CM

## 2019-09-01 DIAGNOSIS — E860 Dehydration: Secondary | ICD-10-CM | POA: Diagnosis not present

## 2019-09-01 DIAGNOSIS — Z510 Encounter for antineoplastic radiation therapy: Secondary | ICD-10-CM | POA: Diagnosis not present

## 2019-09-01 DIAGNOSIS — R5383 Other fatigue: Secondary | ICD-10-CM | POA: Diagnosis not present

## 2019-09-01 DIAGNOSIS — Z9989 Dependence on other enabling machines and devices: Secondary | ICD-10-CM | POA: Diagnosis not present

## 2019-09-01 DIAGNOSIS — C7A1 Malignant poorly differentiated neuroendocrine tumors: Secondary | ICD-10-CM | POA: Diagnosis not present

## 2019-09-01 DIAGNOSIS — Z79899 Other long term (current) drug therapy: Secondary | ICD-10-CM | POA: Diagnosis not present

## 2019-09-01 DIAGNOSIS — T451X5A Adverse effect of antineoplastic and immunosuppressive drugs, initial encounter: Secondary | ICD-10-CM | POA: Diagnosis not present

## 2019-09-01 DIAGNOSIS — Z7982 Long term (current) use of aspirin: Secondary | ICD-10-CM | POA: Diagnosis not present

## 2019-09-01 DIAGNOSIS — D649 Anemia, unspecified: Secondary | ICD-10-CM

## 2019-09-01 DIAGNOSIS — R5381 Other malaise: Secondary | ICD-10-CM | POA: Diagnosis not present

## 2019-09-01 DIAGNOSIS — J449 Chronic obstructive pulmonary disease, unspecified: Secondary | ICD-10-CM | POA: Diagnosis not present

## 2019-09-01 MED ORDER — HEPARIN SOD (PORK) LOCK FLUSH 100 UNIT/ML IV SOLN
500.0000 [IU] | Freq: Once | INTRAVENOUS | Status: AC | PRN
  Administered 2019-09-01: 500 [IU]
  Filled 2019-09-01: qty 5

## 2019-09-01 MED ORDER — SODIUM CHLORIDE 0.9% FLUSH
10.0000 mL | INTRAVENOUS | Status: DC | PRN
  Administered 2019-09-01: 10 mL
  Filled 2019-09-01: qty 10

## 2019-09-01 MED ORDER — SODIUM CHLORIDE 0.9 % IV SOLN
Freq: Once | INTRAVENOUS | Status: AC
  Administered 2019-09-01: 10:00:00 via INTRAVENOUS
  Filled 2019-09-01: qty 250

## 2019-09-01 MED ORDER — DEXAMETHASONE SODIUM PHOSPHATE 10 MG/ML IJ SOLN
10.0000 mg | Freq: Once | INTRAMUSCULAR | Status: AC
  Administered 2019-09-01: 10 mg via INTRAVENOUS
  Filled 2019-09-01: qty 1

## 2019-09-01 MED ORDER — SODIUM CHLORIDE 0.9 % IV SOLN
100.0000 mg/m2 | Freq: Once | INTRAVENOUS | Status: AC
  Administered 2019-09-01: 200 mg via INTRAVENOUS
  Filled 2019-09-01: qty 10

## 2019-09-02 ENCOUNTER — Ambulatory Visit: Payer: Medicare Other

## 2019-09-02 ENCOUNTER — Inpatient Hospital Stay: Payer: Medicare Other

## 2019-09-02 ENCOUNTER — Other Ambulatory Visit: Payer: Self-pay

## 2019-09-02 ENCOUNTER — Ambulatory Visit
Admission: RE | Admit: 2019-09-02 | Discharge: 2019-09-02 | Disposition: A | Discharge status: Home / Self Care | Payer: Medicare Other | Source: Ambulatory Visit | Attending: Radiation Oncology | Admitting: Radiation Oncology

## 2019-09-02 VITALS — BP 133/76 | HR 98 | Temp 96.5°F | Resp 20

## 2019-09-02 DIAGNOSIS — Z79899 Other long term (current) drug therapy: Secondary | ICD-10-CM | POA: Diagnosis not present

## 2019-09-02 DIAGNOSIS — C7A1 Malignant poorly differentiated neuroendocrine tumors: Secondary | ICD-10-CM | POA: Diagnosis not present

## 2019-09-02 DIAGNOSIS — R5383 Other fatigue: Secondary | ICD-10-CM | POA: Diagnosis not present

## 2019-09-02 DIAGNOSIS — R5381 Other malaise: Secondary | ICD-10-CM | POA: Diagnosis not present

## 2019-09-02 DIAGNOSIS — I10 Essential (primary) hypertension: Secondary | ICD-10-CM | POA: Diagnosis not present

## 2019-09-02 DIAGNOSIS — D701 Agranulocytosis secondary to cancer chemotherapy: Secondary | ICD-10-CM | POA: Diagnosis not present

## 2019-09-02 DIAGNOSIS — C3411 Malignant neoplasm of upper lobe, right bronchus or lung: Secondary | ICD-10-CM

## 2019-09-02 DIAGNOSIS — E785 Hyperlipidemia, unspecified: Secondary | ICD-10-CM | POA: Diagnosis not present

## 2019-09-02 DIAGNOSIS — J449 Chronic obstructive pulmonary disease, unspecified: Secondary | ICD-10-CM | POA: Diagnosis not present

## 2019-09-02 DIAGNOSIS — Z510 Encounter for antineoplastic radiation therapy: Secondary | ICD-10-CM | POA: Diagnosis not present

## 2019-09-02 DIAGNOSIS — Z9989 Dependence on other enabling machines and devices: Secondary | ICD-10-CM | POA: Diagnosis not present

## 2019-09-02 DIAGNOSIS — Z7982 Long term (current) use of aspirin: Secondary | ICD-10-CM | POA: Diagnosis not present

## 2019-09-02 DIAGNOSIS — Z5111 Encounter for antineoplastic chemotherapy: Secondary | ICD-10-CM | POA: Diagnosis not present

## 2019-09-02 DIAGNOSIS — E119 Type 2 diabetes mellitus without complications: Secondary | ICD-10-CM | POA: Diagnosis not present

## 2019-09-02 DIAGNOSIS — T451X5A Adverse effect of antineoplastic and immunosuppressive drugs, initial encounter: Secondary | ICD-10-CM | POA: Diagnosis not present

## 2019-09-02 DIAGNOSIS — Z85820 Personal history of malignant melanoma of skin: Secondary | ICD-10-CM | POA: Diagnosis not present

## 2019-09-02 DIAGNOSIS — C7A090 Malignant carcinoid tumor of the bronchus and lung: Secondary | ICD-10-CM | POA: Diagnosis not present

## 2019-09-02 DIAGNOSIS — G4730 Sleep apnea, unspecified: Secondary | ICD-10-CM | POA: Diagnosis not present

## 2019-09-02 DIAGNOSIS — E860 Dehydration: Secondary | ICD-10-CM | POA: Diagnosis not present

## 2019-09-02 MED ORDER — DEXAMETHASONE SODIUM PHOSPHATE 10 MG/ML IJ SOLN
10.0000 mg | Freq: Once | INTRAMUSCULAR | Status: AC
  Administered 2019-09-02: 10 mg via INTRAVENOUS
  Filled 2019-09-02: qty 1

## 2019-09-02 MED ORDER — SODIUM CHLORIDE 0.9% FLUSH
10.0000 mL | INTRAVENOUS | Status: DC | PRN
  Administered 2019-09-02: 10 mL
  Filled 2019-09-02: qty 10

## 2019-09-02 MED ORDER — SODIUM CHLORIDE 0.9 % IV SOLN
100.0000 mg/m2 | Freq: Once | INTRAVENOUS | Status: AC
  Administered 2019-09-02: 200 mg via INTRAVENOUS
  Filled 2019-09-02: qty 10

## 2019-09-02 MED ORDER — SODIUM CHLORIDE 0.9 % IV SOLN
Freq: Once | INTRAVENOUS | Status: AC
  Administered 2019-09-02: 10:00:00 via INTRAVENOUS
  Filled 2019-09-02: qty 250

## 2019-09-02 MED ORDER — PEGFILGRASTIM 6 MG/0.6ML ~~LOC~~ PSKT
6.0000 mg | PREFILLED_SYRINGE | Freq: Once | SUBCUTANEOUS | Status: AC
  Administered 2019-09-02: 6 mg via SUBCUTANEOUS
  Filled 2019-09-02: qty 0.6

## 2019-09-02 MED ORDER — HEPARIN SOD (PORK) LOCK FLUSH 100 UNIT/ML IV SOLN
500.0000 [IU] | Freq: Once | INTRAVENOUS | Status: AC | PRN
  Administered 2019-09-02: 500 [IU]
  Filled 2019-09-02: qty 5

## 2019-09-02 NOTE — Progress Notes (Signed)
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 As a part of his staging work-up he underwent CT chest as well as CT neck. 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 CT neck was unremarkable however CT chest showed a right upper lobe nodule 2.1 x 1.7 cm in with minimal spiculation as well as a 0.5 cm right lower lobe nodule. No mediastinal or hilar adenopathy was noted. Patient was referred to [facility] for further work-up given his preference. He underwent a PET CT scan on 06/10/2019 which showed a hypermetabolic right upper lobe lung nodule measuring 2 cm. Equivocal right infrahilar hypermetabolism with an SUV of 3.1. 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 Large right kidney mass measuring 9.7 x 9 cm. Patient underwent CT-guided lung biopsy which was consistent with large cell neuroendocrine carcinoma.  Concurrent chemoradiation started on 10/6/2020with carboplatin and etoposide  Interval history- feels fatigued but denies other complaints.weight is a pound down in last 2 weeks. Denies any nausea/ vomiting.   ECOG PS- 1 Pain scale- 0   Review of systems- Review of Systems  Constitutional: Positive for  malaise/fatigue. Negative for chills, fever and weight loss.  HENT: Negative for congestion, ear discharge and nosebleeds.   Eyes: Negative for blurred vision.  Respiratory: Negative for cough, hemoptysis, sputum production, shortness of breath and wheezing.   Cardiovascular: Negative for chest pain, palpitations, orthopnea and claudication.  Gastrointestinal: Negative for abdominal pain, blood in stool, constipation, diarrhea, heartburn, melena, nausea and vomiting.  Genitourinary: Negative for dysuria, flank pain, frequency, hematuria and urgency.  Musculoskeletal: Negative for back pain, joint pain and myalgias.  Skin: Negative for rash.  Neurological: Negative for dizziness, tingling, focal weakness, seizures, weakness and headaches.  Endo/Heme/Allergies: Does not bruise/bleed easily.  Psychiatric/Behavioral: Negative for depression and suicidal ideas. The patient does not have insomnia.       Allergies  Allergen Reactions   Codeine Nausea And Vomiting and Other (See Comments)    Pt states when he takes codeine, he throws up blood   Niacin And Related Other (See Comments)    "made his body feel hot & he had a hard time breathing"     Past Medical History:  Diagnosis Date   Anxiety    COPD (chronic obstructive pulmonary disease) ([place])    Coronary artery disease 2006   Acute coronary syndrome in March 2006.    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   CORONARY STENT PLACEMENT   2010   EAR CYST EXCISION Right 10/05/2018   Procedure: Right Partial pinnectomy;  Surgeon: Izora Gala, MD;  Location: [facility];  Service: ENT;  Laterality: Right;   GRAFT(S) ANGIOGRAM  10/21/2011   Procedure: GRAFT(S) Cyril Loosen;  Surgeon: Leonie Man, MD;  Location: [facility] CATH LAB;  Service: Cardiovascular;;   LEFT HEART CATHETERIZATION WITH CORONARY ANGIOGRAM N/A 10/21/2011   Procedure: LEFT HEART CATHETERIZATION WITH CORONARY ANGIOGRAM;  Surgeon: Leonie Man, MD;  Location: [facility] CATH LAB;  Service: Cardiovascular;  Laterality: N/A;   LYMPH NODE BIOPSY Right 10/05/2018   Procedure: sentinel NODE BIOPSY with right partial modified neck dissection;  Surgeon: Izora Gala, MD;  Location: [facility];  Service: ENT;  Laterality: Right;   PORTA CATH INSERTION N/A 07/12/2019   Procedure: PORTA CATH INSERTION;  Surgeon: Algernon Huxley, MD;  Location: [place] CV LAB;  Service: Cardiovascular;  Laterality: N/A;    Social History   Socioeconomic History   Marital status: Married    Spouse name: JoAnne Boodram   Number of children: Not on file   Years of education: Not on file   Highest education level: Not on file  Occupational History   Not on file  Social Needs   Financial resource strain: Not on file   Food insecurity    Worry: Not on file    Inability: Not on file   Transportation needs    Medical: Not on file    Non-medical: Not on file  Tobacco Use   Smoking status: Current Every Day Smoker    Packs/day: 2.00    Years: 50.00    Pack years: 100.00    Types: Cigarettes   Smokeless tobacco: Former Systems developer    Types: Chew    Quit date: 01/23/2013  Substance and Sexual Activity   Alcohol use: No   Drug use: Not Currently   Sexual activity: Not Currently  Lifestyle   Physical activity    Days per week: Not on file    Minutes per session: Not on file   Stress: Not on file  Relationships   Social connections    Talks on phone: Not on file    Gets together: Not on  file    Attends religious service: Not on file    Active member of club or organization: Not on file    Attends meetings of clubs or organizations: Not on file    Relationship status: Not on file   Intimate partner violence    Fear of current or ex partner: Not on file    Emotionally abused: Not on file    Physically abused: Not on file    Forced sexual activity: Not on file  Other Topics Concern   Not on file  Social History Narrative   Lives with son and grandson    Family History  Problem Relation Age of Onset   Hypertension Mother    Heart attack Mother    Heart attack Father 79     Current Outpatient Medications:    ALPRAZolam (XANAX) 0.25 MG tablet, Take 0.25 mg by mouth 3 (three) times  daily. , Disp: , Rfl:    amLODipine (NORVASC) 10 MG tablet, Take 10 mg by mouth daily., Disp: , Rfl:    aspirin EC 81 MG tablet, Take 81 mg by mouth daily., Disp: , Rfl:    atorvastatin (LIPITOR) 40 MG tablet, TAKE 1 TABLET (40 MG TOTAL) BY MOUTH AT BEDTIME. KEEP OCTOBER APPOINTMENT, Disp: 90 tablet, Rfl: 0   Baclofen 5 MG TABS, Take 5 mg by mouth 3 (three) times daily. If 5 mg tid is not working then can increase to 10 mg tid, Disp: 90 tablet, Rfl: 0   busPIRone (BUSPAR) 7.5 MG tablet, Take 1 tablet by mouth 2 (two) times daily., Disp: , Rfl:    dexamethasone (DECADRON) 4 MG tablet, Take 2 tablets (8 mg total) by mouth daily. Start the day after chemotherapy for 1 day., Disp: 30 tablet, Rfl: 1   ezetimibe (ZETIA) 10 MG tablet, TAKE 1 TABLET BY MOUTH EVERY DAY, Disp: 90 tablet, Rfl: 3   famotidine (PEPCID) 20 MG tablet, TAKE 1 TABLET (20 MG TOTAL) BY MOUTH DAILY. NEEDS APPOINTMENT FOR FUTURE REFILLS, Disp: 30 tablet, Rfl: 3   fluconazole (DIFLUCAN) 100 MG tablet, Take 1 tablet (100 mg total) by mouth daily. Take 2 tablets on day 1 and then 1 tablet a day until rx ends, Disp: 6 tablet, Rfl: 0   INCRUSE ELLIPTA 62.5 MCG/INH AEPB, Inhale 1 puff into the lungs daily. , Disp: , Rfl:  2   Ipratropium-Albuterol (COMBIVENT RESPIMAT) 20-100 MCG/ACT AERS respimat, Inhale 1 puff into the lungs every 6 (six) hours., Disp: , Rfl:    isosorbide mononitrate (IMDUR) 60 MG 24 hr tablet, TAKE 1 TABLET BY MOUTH EVERY DAY, Disp: 90 tablet, Rfl: 1   lidocaine-prilocaine (EMLA) cream, Apply to affected area once, Disp: 30 g, Rfl: 3   LORazepam (ATIVAN) 0.5 MG tablet, Take 1 tablet 30 minutes prior to each scan, Disp: 2 tablet, Rfl: 0   losartan-hydrochlorothiazide (HYZAAR) 100-12.5 MG tablet, Take 1 tablet by mouth daily., Disp: 30 tablet, Rfl: 10   magic mouthwash w/lidocaine SOLN, Take 5 mLs by mouth 4 (four) times daily as needed for mouth pain., Disp: 460 mL, Rfl: 0   metFORMIN (GLUCOPHAGE) 500 MG tablet, TAKE 1 TABLET (500 MG TOTAL) BY MOUTH 2 (TWO) TIMES DAILY WITH A MEAL., Disp: 180 tablet, Rfl: 0   nitroGLYCERIN (NITROSTAT) 0.4 MG SL tablet, Place 0.4 mg under the tongue every 5 (five) minutes as needed for chest pain., Disp: , Rfl:    nystatin (MYCOSTATIN) 100000 UNIT/ML suspension, Take 5 mLs (500,000 Units total) by mouth 4 (four) times daily., Disp: 473 mL, Rfl: 0   ondansetron (ZOFRAN) 8 MG tablet, Take 1 tablet (8 mg total) by mouth 2 (two) times daily as needed for refractory nausea / vomiting. Start on day 3 after carboplatin chemo., Disp: 30 tablet, Rfl: 1   prochlorperazine (COMPAZINE) 10 MG tablet, Take 1 tablet (10 mg total) by mouth every 6 (six) hours as needed (Nausea or vomiting)., Disp: 30 tablet, Rfl: 1   ranolazine (RANEXA) 1000 MG SR tablet, TAKE 1 TABLET BY MOUTH TWICE A DAY, Disp: 60 tablet, Rfl: 11   traMADol (ULTRAM) 50 MG tablet, Take 1 tablet (50 mg total) by mouth every 6 (six) hours as needed., Disp: 30 tablet, Rfl: 0 No current facility-administered medications for this visit.   Facility-Administered Medications Ordered in Other Visits:    heparin lock flush 100 unit/mL, 500 Units, Intravenous, Once, Sindy Guadeloupe, MD   sodium chloride  flush  (NS) 0.9 % injection 10 mL, 10 mL, Intravenous, PRN, Sindy Guadeloupe, MD   sodium chloride flush (NS) 0.9 % injection 10 mL, 10 mL, Intravenous, PRN, Sindy Guadeloupe, MD, 10 mL at 08/31/19 1123   sodium chloride flush (NS) 0.9 % injection 10 mL, 10 mL, Intracatheter, PRN, Sindy Guadeloupe, MD, 10 mL at 09/02/19 1017  Physical exam:  Vitals:   08/31/19 1142  BP: 127/69  Pulse: 93  Temp: (!) 97.2 F (36.2 C)  TempSrc: Tympanic  SpO2: 98%  Weight: 171 lb (77.6 kg)   Physical Exam HENT:     Head: Normocephalic and atraumatic.     Mouth/Throat:     Mouth: Mucous membranes are moist.     Pharynx: Oropharynx is clear.  Eyes:     Pupils: Pupils are equal, round, and reactive to light.  Neck:     Musculoskeletal: Normal range of motion.  Cardiovascular:     Rate and Rhythm: Normal rate and regular rhythm.     Heart sounds: Normal heart sounds.  Pulmonary:     Effort: Pulmonary effort is normal.     Breath sounds: Normal breath sounds.  Abdominal:     General: Bowel sounds are normal.     Palpations: Abdomen is soft.  Skin:    General: Skin is warm and dry.  Neurological:     Mental Status: He is alert and oriented to person, place, and time.      CMP Latest Ref Rng & Units 08/31/2019  Glucose 70 - 99 mg/dL 306(H)  BUN 8 - 23 mg/dL 13  Creatinine 0.61 - 1.24 mg/dL 1.26(H)  Sodium 135 - 145 mmol/L 130(L)  Potassium 3.5 - 5.1 mmol/L 4.2  Chloride 98 - 111 mmol/L 100  CO2 22 - 32 mmol/L 23  Calcium 8.9 - 10.3 mg/dL 8.4(L)  Total Protein 6.5 - 8.1 g/dL 6.5  Total Bilirubin 0.3 - 1.2 mg/dL 0.5  Alkaline Phos 38 - 126 U/L 83  AST 15 - 41 U/L 15  ALT 0 - 44 U/L 14   CBC Latest Ref Rng & Units 08/31/2019  WBC 4.0 - 10.5 K/uL 11.7(H)  Hemoglobin 13.0 - 17.0 g/dL 8.6(L)  Hematocrit 39.0 - 52.0 % 26.5(L)  Platelets 150 - 400 K/uL 215    No images are attached to the encounter.  Dg Fluoro Guide Cv Line Left  Result Date: 08/20/2019 INDICATION: Inability to obtain blood  return.  Evaluate for patency. EXAM: Port-A-Cath check. MEDICATIONS: None. ANESTHESIA/SEDATION: None. FLUOROSCOPY TIME:  Fluoroscopy Time: 0 minutes minutes 30 seconds (20.20 mGy). COMPLICATIONS: None immediate. PROCEDURE: Through the existing port 10 cc of Omnipaque 300 was administered. No evidence of catheter fracture. Catheter tip is positioned in the superior vena cava. The port is widely patent. Small fibrin sheath cannot be completely excluded. IMPRESSION: Port-A-Cath widely patent with tip in superior vena cava. A small fibrin sheath cannot be excluded. Electronically Signed   By: Marcello Moores  Register   On: 08/20/2019 10:41   US Biopsy (kidney)  Result Date: 08/06/2019 INDICATION: 11 cm mass emanating from the lower pole of the right kidney and recent diagnosis of right lung cancer. The patient presents for biopsy of the right renal mass in order to guide therapy. EXAM: ULTRASOUND GUIDED CORE BIOPSY OF RIGHT RENAL MASS MEDICATIONS: None. ANESTHESIA/SEDATION: Fentanyl 100 mcg IV; Versed 2.0 mg IV Moderate Sedation Time:  19 minutes. The patient was continuously monitored during the procedure by the interventional radiology nurse under my  direct supervision. PROCEDURE: The procedure, risks, benefits, and alternatives were explained to the patient. Questions regarding the procedure were encouraged and answered. The patient understands and consents to the procedure. A time-out was performed prior to initiating the procedure. The right kidney was imaged by ultrasound in a prone position. The right flank region was prepped with chlorhexidine in a sterile fashion, and a sterile drape was applied covering the operative field. A sterile gown and sterile gloves were used for the procedure. Local anesthesia was provided with 1% Lidocaine. Under ultrasound guidance, a 17 gauge trocar needle was advanced to the level of a right lower pole renal mass. Coaxial 18 gauge core biopsy samples were obtained with an automated  device. Three separate core biopsy samples were obtained and submitted in formalin. On completion of the procedure Gel-Foam pledgets were advanced through the 17 gauge needle as it was removed. Additional ultrasound was performed. COMPLICATIONS: None immediate. FINDINGS: Ultrasound demonstrates a large lobulated mass emanating from the lower pole of the right kidney measuring approximately 10.8 cm in maximum diameter by ultrasound. Solid tissue was obtained. There were no immediate complications. IMPRESSION: Ultrasound-guided core biopsy performed of a large 11 cm mass emanating from the lower pole of the right kidney. Electronically Signed   By: Aletta Edouard M.D.   On: 08/06/2019 09:48     Assessment and plan- Patient is a 69 y.o. male with stage I large cell neuroendocrine tumor of the right lung. He is here for on treatment assessment prior to cycle 3 of carboplatin/etoposide chemotherapy.   Counts ok to proceed with cycle 3 of carboplatin etoposide on day 1 today and etoposide day 2 and day 3. He is still receiving radiation treatment and will therefore not be receiving neulasta  Chemo induced anemia- iron studies and folate normal. b12 pending. This is likely chemo induced. I am dose reducing carboplatin to auc 4 today. I will see him 2 weeks for possible transfusion.  RTC in 4 weeks with cbc with cmp for cycle 4 of carboplatin/etoposide. Hopefully I can give him onpro neulasta at that time  Scans after cycle 4   Visit Diagnosis 1. Encounter for antineoplastic chemotherapy   2. Malignant neoplasm of upper lobe of right lung ([place])   3. Antineoplastic chemotherapy induced anemia      Dr. Randa Evens, MD, MPH [facility] [phone] 09/02/2019 12:11 PM

## 2019-09-03 ENCOUNTER — Other Ambulatory Visit: Payer: Self-pay

## 2019-09-03 ENCOUNTER — Ambulatory Visit
Admission: RE | Admit: 2019-09-03 | Discharge: 2019-09-03 | Disposition: A | Discharge status: Home / Self Care | Payer: Medicare Other | Source: Ambulatory Visit | Attending: Radiation Oncology | Admitting: Radiation Oncology

## 2019-09-03 ENCOUNTER — Ambulatory Visit: Payer: Medicare Other

## 2019-09-03 DIAGNOSIS — Z510 Encounter for antineoplastic radiation therapy: Secondary | ICD-10-CM | POA: Diagnosis not present

## 2019-09-03 DIAGNOSIS — C7A1 Malignant poorly differentiated neuroendocrine tumors: Secondary | ICD-10-CM | POA: Diagnosis not present

## 2019-09-05 ENCOUNTER — Other Ambulatory Visit: Payer: Self-pay | Admitting: Cardiovascular Disease

## 2019-09-06 ENCOUNTER — Other Ambulatory Visit: Payer: Self-pay

## 2019-09-06 ENCOUNTER — Ambulatory Visit: Payer: Medicare Other

## 2019-09-06 ENCOUNTER — Ambulatory Visit
Admission: RE | Admit: 2019-09-06 | Discharge: 2019-09-06 | Disposition: A | Discharge status: Home / Self Care | Payer: Medicare Other | Source: Ambulatory Visit | Attending: Radiation Oncology | Admitting: Radiation Oncology

## 2019-09-06 DIAGNOSIS — Z510 Encounter for antineoplastic radiation therapy: Secondary | ICD-10-CM | POA: Diagnosis not present

## 2019-09-06 DIAGNOSIS — C7A1 Malignant poorly differentiated neuroendocrine tumors: Secondary | ICD-10-CM | POA: Diagnosis not present

## 2019-09-07 ENCOUNTER — Ambulatory Visit: Payer: Medicare Other

## 2019-09-07 ENCOUNTER — Other Ambulatory Visit: Payer: Self-pay

## 2019-09-07 ENCOUNTER — Ambulatory Visit
Admission: RE | Admit: 2019-09-07 | Discharge: 2019-09-07 | Disposition: A | Discharge status: Home / Self Care | Payer: Medicare Other | Source: Ambulatory Visit | Attending: Radiation Oncology | Admitting: Radiation Oncology

## 2019-09-07 DIAGNOSIS — C7A1 Malignant poorly differentiated neuroendocrine tumors: Secondary | ICD-10-CM | POA: Diagnosis not present

## 2019-09-07 DIAGNOSIS — Z510 Encounter for antineoplastic radiation therapy: Secondary | ICD-10-CM | POA: Diagnosis not present

## 2019-09-08 ENCOUNTER — Ambulatory Visit: Payer: Medicare Other

## 2019-09-08 ENCOUNTER — Encounter: Payer: Self-pay | Admitting: Cardiovascular Disease

## 2019-09-08 ENCOUNTER — Ambulatory Visit: Payer: Medicare Other | Admitting: Cardiovascular Disease

## 2019-09-13 ENCOUNTER — Ambulatory Visit: Payer: Medicare Other

## 2019-09-14 ENCOUNTER — Encounter: Payer: Self-pay | Admitting: Oncology

## 2019-09-14 ENCOUNTER — Inpatient Hospital Stay: Payer: Medicare Other | Attending: Oncology

## 2019-09-14 ENCOUNTER — Ambulatory Visit: Payer: Medicare Other

## 2019-09-14 ENCOUNTER — Ambulatory Visit: Payer: Medicare Other | Admitting: Urology

## 2019-09-14 ENCOUNTER — Other Ambulatory Visit: Payer: Self-pay

## 2019-09-14 ENCOUNTER — Encounter: Payer: Self-pay | Admitting: Urology

## 2019-09-14 ENCOUNTER — Inpatient Hospital Stay: Payer: Medicare Other

## 2019-09-14 ENCOUNTER — Inpatient Hospital Stay (HOSPITAL_BASED_OUTPATIENT_CLINIC_OR_DEPARTMENT_OTHER): Payer: Medicare Other | Admitting: Oncology

## 2019-09-14 VITALS — BP 118/71 | HR 110 | Temp 97.4°F | Wt 170.0 lb

## 2019-09-14 DIAGNOSIS — F17210 Nicotine dependence, cigarettes, uncomplicated: Secondary | ICD-10-CM | POA: Diagnosis not present

## 2019-09-14 DIAGNOSIS — D6959 Other secondary thrombocytopenia: Secondary | ICD-10-CM | POA: Diagnosis not present

## 2019-09-14 DIAGNOSIS — R5383 Other fatigue: Secondary | ICD-10-CM

## 2019-09-14 DIAGNOSIS — R0789 Other chest pain: Secondary | ICD-10-CM | POA: Insufficient documentation

## 2019-09-14 DIAGNOSIS — E785 Hyperlipidemia, unspecified: Secondary | ICD-10-CM | POA: Insufficient documentation

## 2019-09-14 DIAGNOSIS — G4730 Sleep apnea, unspecified: Secondary | ICD-10-CM | POA: Diagnosis not present

## 2019-09-14 DIAGNOSIS — D6481 Anemia due to antineoplastic chemotherapy: Secondary | ICD-10-CM | POA: Diagnosis not present

## 2019-09-14 DIAGNOSIS — R062 Wheezing: Secondary | ICD-10-CM | POA: Diagnosis not present

## 2019-09-14 DIAGNOSIS — C3411 Malignant neoplasm of upper lobe, right bronchus or lung: Secondary | ICD-10-CM

## 2019-09-14 DIAGNOSIS — Z5111 Encounter for antineoplastic chemotherapy: Secondary | ICD-10-CM | POA: Diagnosis not present

## 2019-09-14 DIAGNOSIS — T451X5A Adverse effect of antineoplastic and immunosuppressive drugs, initial encounter: Secondary | ICD-10-CM

## 2019-09-14 DIAGNOSIS — Z85820 Personal history of malignant melanoma of skin: Secondary | ICD-10-CM | POA: Insufficient documentation

## 2019-09-14 DIAGNOSIS — Z85819 Personal history of malignant neoplasm of unspecified site of lip, oral cavity, and pharynx: Secondary | ICD-10-CM | POA: Insufficient documentation

## 2019-09-14 DIAGNOSIS — I2510 Atherosclerotic heart disease of native coronary artery without angina pectoris: Secondary | ICD-10-CM | POA: Insufficient documentation

## 2019-09-14 DIAGNOSIS — R5381 Other malaise: Secondary | ICD-10-CM | POA: Diagnosis not present

## 2019-09-14 DIAGNOSIS — J449 Chronic obstructive pulmonary disease, unspecified: Secondary | ICD-10-CM | POA: Insufficient documentation

## 2019-09-14 DIAGNOSIS — I10 Essential (primary) hypertension: Secondary | ICD-10-CM | POA: Insufficient documentation

## 2019-09-14 DIAGNOSIS — Z79899 Other long term (current) drug therapy: Secondary | ICD-10-CM | POA: Diagnosis not present

## 2019-09-14 DIAGNOSIS — Z7984 Long term (current) use of oral hypoglycemic drugs: Secondary | ICD-10-CM | POA: Insufficient documentation

## 2019-09-14 DIAGNOSIS — C7A090 Malignant carcinoid tumor of the bronchus and lung: Secondary | ICD-10-CM | POA: Insufficient documentation

## 2019-09-14 DIAGNOSIS — F419 Anxiety disorder, unspecified: Secondary | ICD-10-CM | POA: Diagnosis not present

## 2019-09-14 DIAGNOSIS — Z7982 Long term (current) use of aspirin: Secondary | ICD-10-CM | POA: Insufficient documentation

## 2019-09-14 DIAGNOSIS — D649 Anemia, unspecified: Secondary | ICD-10-CM

## 2019-09-14 DIAGNOSIS — E119 Type 2 diabetes mellitus without complications: Secondary | ICD-10-CM | POA: Insufficient documentation

## 2019-09-14 LAB — CBC WITH DIFFERENTIAL/PLATELET
Abs Immature Granulocytes: 1.5 10*3/uL — ABNORMAL HIGH (ref 0.00–0.07)
Band Neutrophils: 6 %
Basophils Absolute: 0 10*3/uL (ref 0.0–0.1)
Basophils Relative: 0 %
Eosinophils Absolute: 0.2 10*3/uL (ref 0.0–0.5)
Eosinophils Relative: 1 %
HCT: 23 % — ABNORMAL LOW (ref 39.0–52.0)
Hemoglobin: 7.1 g/dL — ABNORMAL LOW (ref 13.0–17.0)
Lymphocytes Relative: 10 %
Lymphs Abs: 1.9 10*3/uL (ref 0.7–4.0)
MCH: 28.6 pg (ref 26.0–34.0)
MCHC: 30.9 g/dL (ref 30.0–36.0)
MCV: 92.7 fL (ref 80.0–100.0)
Metamyelocytes Relative: 4 %
Monocytes Absolute: 1.7 10*3/uL — ABNORMAL HIGH (ref 0.1–1.0)
Monocytes Relative: 9 %
Myelocytes: 4 %
Neutro Abs: 13.5 10*3/uL — ABNORMAL HIGH (ref 1.7–7.7)
Neutrophils Relative %: 66 %
Platelets: 36 10*3/uL — ABNORMAL LOW (ref 150–400)
RBC: 2.48 MIL/uL — ABNORMAL LOW (ref 4.22–5.81)
RDW: 16.3 % — ABNORMAL HIGH (ref 11.5–15.5)
Smear Review: NORMAL
WBC: 18.8 10*3/uL — ABNORMAL HIGH (ref 4.0–10.5)
nRBC: 0.8 % — ABNORMAL HIGH (ref 0.0–0.2)

## 2019-09-14 LAB — COMPREHENSIVE METABOLIC PANEL
ALT: 18 U/L (ref 0–44)
AST: 20 U/L (ref 15–41)
Albumin: 2.9 g/dL — ABNORMAL LOW (ref 3.5–5.0)
Alkaline Phosphatase: 100 U/L (ref 38–126)
Anion gap: 10 (ref 5–15)
BUN: 11 mg/dL (ref 8–23)
CO2: 21 mmol/L — ABNORMAL LOW (ref 22–32)
Calcium: 8.8 mg/dL — ABNORMAL LOW (ref 8.9–10.3)
Chloride: 105 mmol/L (ref 98–111)
Creatinine, Ser: 1.16 mg/dL (ref 0.61–1.24)
GFR calc Af Amer: >60 mL/min (ref >60)
GFR calc non Af Amer: >60 mL/min (ref >60)
Glucose, Bld: 225 mg/dL — ABNORMAL HIGH (ref 70–99)
Potassium: 3.6 mmol/L (ref 3.5–5.1)
Sodium: 136 mmol/L (ref 135–145)
Total Bilirubin: 0.4 mg/dL (ref 0.3–1.2)
Total Protein: 6.3 g/dL — ABNORMAL LOW (ref 6.5–8.1)

## 2019-09-14 LAB — SAMPLE TO BLOOD BANK

## 2019-09-14 LAB — VITAMIN B12: Vitamin B-12: 413 pg/mL (ref 180–914)

## 2019-09-14 LAB — ABO/RH: ABO/RH(D): O NEG

## 2019-09-14 LAB — PREPARE RBC (CROSSMATCH)

## 2019-09-14 MED ORDER — ACETAMINOPHEN 325 MG PO TABS
650.0000 mg | ORAL_TABLET | Freq: Once | ORAL | Status: AC
  Administered 2019-09-14: 650 mg via ORAL
  Filled 2019-09-14: qty 2

## 2019-09-14 MED ORDER — SODIUM CHLORIDE 0.9% IV SOLUTION
250.0000 mL | Freq: Once | INTRAVENOUS | Status: AC
  Administered 2019-09-14: 10:00:00 250 mL via INTRAVENOUS
  Filled 2019-09-14: qty 250

## 2019-09-14 MED ORDER — SODIUM CHLORIDE 0.9% FLUSH
10.0000 mL | INTRAVENOUS | Status: DC | PRN
  Administered 2019-09-14: 10 mL via INTRAVENOUS
  Filled 2019-09-14: qty 10

## 2019-09-14 MED ORDER — HEPARIN SOD (PORK) LOCK FLUSH 100 UNIT/ML IV SOLN
500.0000 [IU] | Freq: Once | INTRAVENOUS | Status: AC
  Administered 2019-09-14: 500 [IU] via INTRAVENOUS
  Filled 2019-09-14: qty 5

## 2019-09-14 NOTE — Progress Notes (Signed)
No blood return noted from port, pt denies pain with flushing. Port flushes without difficulty and no redness or swelling noted. Dr. Janese Banks aware and NNO at this time.

## 2019-09-14 NOTE — Progress Notes (Signed)
Patient stated that he stays tired and weak. Patient stated that he stays SOB too.

## 2019-09-15 ENCOUNTER — Telehealth: Payer: Self-pay | Admitting: Urology

## 2019-09-15 LAB — TYPE AND SCREEN
ABO/RH(D): O NEG
Antibody Screen: NEGATIVE
Unit division: 0

## 2019-09-15 LAB — BPAM RBC
Blood Product Expiration Date: 202012012359
ISSUE DATE / TIME: 202012011102
Unit Type and Rh: 9500

## 2019-09-15 NOTE — Telephone Encounter (Signed)
This patient was a no-show to clinic yesterday.  He likely needs a nephrectomy in January.  Please schedule him to see me in early Jan to book this surgery.  Hollice Espy, MD

## 2019-09-16 NOTE — Progress Notes (Signed)
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 As a part of his staging work-up he underwent CT chest as well as CT neck. 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 CT neck was unremarkable however CT chest showed a right upper lobe nodule 2.1 x 1.7 cm in with minimal spiculation as well as a 0.5 cm right lower lobe nodule. No mediastinal or hilar adenopathy was noted. Patient was referred to [facility] for further work-up given his preference. He underwent a PET CT scan on 06/10/2019 which showed a hypermetabolic right upper lobe lung nodule measuring 2 cm. Equivocal right infrahilar hypermetabolism with an SUV of 3.1. 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 Large right kidney mass measuring 9.7 x 9 cm. Patient underwent CT-guided lung biopsy which was consistent with large cell neuroendocrine carcinoma.  Concurrent chemoradiation started on 10/6/2020with carboplatin and etoposide   Interval history-patient reports significant fatigue.  He has baseline exertional shortness of breath which is somewhat increased.  Denies any cough fever or sputum production.  ECOG PS- 1 Pain scale- 0 Opioid associated constipation- no  Review of systems- Review of Systems   Constitutional: Positive for malaise/fatigue. Negative for chills, fever and weight loss.  HENT: Negative for congestion, ear discharge and nosebleeds.   Eyes: Negative for blurred vision.  Respiratory: Positive for shortness of breath. Negative for cough, hemoptysis, sputum production and wheezing.   Cardiovascular: Negative for chest pain, palpitations, orthopnea and claudication.  Gastrointestinal: Negative for abdominal pain, blood in stool, constipation, diarrhea, heartburn, melena, nausea and vomiting.  Genitourinary: Negative for dysuria, flank pain, frequency, hematuria and urgency.  Musculoskeletal: Negative for back pain, joint pain and myalgias.  Skin: Negative for rash.  Neurological: Negative for dizziness, tingling, focal weakness, seizures, weakness and headaches.  Endo/Heme/Allergies: Does not bruise/bleed easily.  Psychiatric/Behavioral: Negative for depression and suicidal ideas. The patient does not have insomnia.       Allergies  Allergen Reactions  . Codeine Nausea And Vomiting and Other (See Comments)    Pt states when he takes codeine, he throws up blood  . Niacin And Related Other (See Comments)    "made his body feel hot & he had a hard time breathing"     Past Medical History:  Diagnosis Date  . Anxiety   . COPD (chronic obstructive pulmonary disease) ([place])   . Coronary artery disease 2006   Acute coronary syndrome in March 2006.   . Diabetes mellitus without complication ([place])   . Dyslipidemia   . Hypertension 10/09/2010   EF 40-45%  . Lung cancer ([place])   . 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 Sleep apnea    uses cpap machine     Past Surgical History:  Procedure Laterality  Date  . Arterial evalation      no evidence of ICA stenosis  . CARDIAC CATHETERIZATION    . CARDIAC SURGERY    . CORONARY ARTERY BYPASS GRAFT  2006   Post CABG surgery with a LIMA to the LAD, a vein to the diagonal ,,a vein to the the obtuse marginal and vein  to the PDA.  [name] CORONARY STENT PLACEMENT  2010  . EAR CYST EXCISION Right 10/05/2018   Procedure: Right Partial pinnectomy;  Surgeon: Izora Gala, MD;  Location: [place];  Service: ENT;  Laterality: Right;  . GRAFT(S) ANGIOGRAM  10/21/2011   Procedure: GRAFT(S) Cyril Loosen;  Surgeon: Leonie Man, MD;  Location: [facility] CATH LAB;  Service: Cardiovascular;;  . LEFT HEART CATHETERIZATION WITH CORONARY ANGIOGRAM N/A 10/21/2011   Procedure: LEFT HEART CATHETERIZATION WITH CORONARY ANGIOGRAM;  Surgeon: Leonie Man, MD;  Location: [facility] CATH LAB;  Service: Cardiovascular;  Laterality: N/A;  . LYMPH NODE BIOPSY Right 10/05/2018   Procedure: sentinel NODE BIOPSY with right partial modified neck dissection;  Surgeon: Izora Gala, MD;  Location: [place];  Service: ENT;  Laterality: Right;  . PORTA CATH INSERTION N/A 07/12/2019   Procedure: PORTA CATH INSERTION;  Surgeon: Algernon Huxley, MD;  Location: [place] CV LAB;  Service: Cardiovascular;  Laterality: N/A;    Social History   Socioeconomic History  . Marital status: Married    Spouse name: JoAnne Persing  . Number of children: Not on file  . Years of education: Not on file  . Highest education level: Not on file  Occupational History  . Not on file  Social Needs  . Financial resource strain: Not on file  . Food insecurity    Worry: Not on file    Inability: Not on file  . Transportation needs    Medical: Not on file    Non-medical: Not on file  Tobacco Use  . Smoking status: Current Every Day Smoker    Packs/day: 2.00    Years: 50.00    Pack years: 100.00    Types: Cigarettes  . Smokeless tobacco: Former Systems developer    Types: [place] date: 01/23/2013  Substance and Sexual Activity  . Alcohol use: No  . Drug use: Not Currently  . Sexual activity: Not Currently  Lifestyle  . Physical activity    Days per week: Not on file    Minutes per session: Not on file  . Stress: Not on file  Relationships  . Social Herbalist on  phone: Not on file    Gets together: Not on file    Attends religious service: Not on file    Active member of club or organization: Not on file    Attends meetings of clubs or organizations: Not on file    Relationship status: Not on file  . Intimate partner violence    Fear of current or ex partner: Not on file    Emotionally abused: Not on file    Physically abused: Not on file    Forced sexual activity: Not on file  Other Topics Concern  . Not on file  Social History Narrative   Lives with son and grandson    Family History  Problem Relation Age of Onset  . Hypertension Mother   . Heart attack Mother   . Heart attack Father 23     Current Outpatient Medications:  .  ALPRAZolam (XANAX) 0.25 MG tablet, Take 0.25 mg by  mouth 3 (three) times daily. , Disp: , Rfl:  .  amLODipine (NORVASC) 10 MG tablet, Take 10 mg by mouth daily., Disp: , Rfl:  .  aspirin EC 81 MG tablet, Take 81 mg by mouth daily., Disp: , Rfl:  .  atorvastatin (LIPITOR) 40 MG tablet, TAKE 1 TABLET (40 MG TOTAL) BY MOUTH AT BEDTIME. KEEP OCTOBER APPOINTMENT, Disp: 90 tablet, Rfl: 0 .  Baclofen 5 MG TABS, Take 5 mg by mouth 3 (three) times daily. If 5 mg tid is not working then can increase to 10 mg tid, Disp: 90 tablet, Rfl: 0 .  busPIRone (BUSPAR) 7.5 MG tablet, Take 1 tablet by mouth 2 (two) times daily., Disp: , Rfl:  .  dexamethasone (DECADRON) 4 MG tablet, Take 2 tablets (8 mg total) by mouth daily. Start the day after chemotherapy for 1 day., Disp: 30 tablet, Rfl: 1 .  ezetimibe (ZETIA) 10 MG tablet, TAKE 1 TABLET BY MOUTH EVERY DAY, Disp: 90 tablet, Rfl: 3 .  famotidine (PEPCID) 20 MG tablet, TAKE 1 TABLET (20 MG TOTAL) BY MOUTH DAILY. NEEDS APPOINTMENT FOR FUTURE REFILLS, Disp: 30 tablet, Rfl: 3 .  fluconazole (DIFLUCAN) 100 MG tablet, Take 1 tablet (100 mg total) by mouth daily. Take 2 tablets on day 1 and then 1 tablet a day until rx ends, Disp: 6 tablet, Rfl: 0 .  INCRUSE ELLIPTA 62.5 MCG/INH AEPB,  Inhale 1 puff into the lungs daily. , Disp: , Rfl: 2 .  Ipratropium-Albuterol (COMBIVENT RESPIMAT) 20-100 MCG/ACT AERS respimat, Inhale 1 puff into the lungs every 6 (six) hours., Disp: , Rfl:  .  isosorbide mononitrate (IMDUR) 60 MG 24 hr tablet, TAKE 1 TABLET BY MOUTH EVERY DAY, Disp: 90 tablet, Rfl: 1 .  lidocaine-prilocaine (EMLA) cream, Apply to affected area once, Disp: 30 g, Rfl: 3 .  LORazepam (ATIVAN) 0.5 MG tablet, Take 1 tablet 30 minutes prior to each scan, Disp: 2 tablet, Rfl: 0 .  losartan-hydrochlorothiazide (HYZAAR) 100-12.5 MG tablet, Take 1 tablet by mouth daily., Disp: 30 tablet, Rfl: 10 .  magic mouthwash w/lidocaine SOLN, Take 5 mLs by mouth 4 (four) times daily as needed for mouth pain., Disp: 460 mL, Rfl: 0 .  metFORMIN (GLUCOPHAGE) 500 MG tablet, TAKE 1 TABLET (500 MG TOTAL) BY MOUTH 2 (TWO) TIMES DAILY WITH A MEAL., Disp: 180 tablet, Rfl: 0 .  nitroGLYCERIN (NITROSTAT) 0.4 MG SL tablet, Place 0.4 mg under the tongue every 5 (five) minutes as needed for chest pain., Disp: , Rfl:  .  nystatin (MYCOSTATIN) 100000 UNIT/ML suspension, Take 5 mLs (500,000 Units total) by mouth 4 (four) times daily., Disp: 473 mL, Rfl: 0 .  ondansetron (ZOFRAN) 8 MG tablet, Take 1 tablet (8 mg total) by mouth 2 (two) times daily as needed for refractory nausea / vomiting. Start on day 3 after carboplatin chemo., Disp: 30 tablet, Rfl: 1 .  prochlorperazine (COMPAZINE) 10 MG tablet, Take 1 tablet (10 mg total) by mouth every 6 (six) hours as needed (Nausea or vomiting)., Disp: 30 tablet, Rfl: 1 .  ranolazine (RANEXA) 1000 MG SR tablet, TAKE 1 TABLET BY MOUTH TWICE A DAY, Disp: 60 tablet, Rfl: 11 .  traMADol (ULTRAM) 50 MG tablet, Take 1 tablet (50 mg total) by mouth every 6 (six) hours as needed., Disp: 30 tablet, Rfl: 0 No current facility-administered medications for this visit.   Facility-Administered Medications Ordered in Other Visits:  .  heparin lock flush 100 unit/mL, 500 Units, Intravenous,  Once, Sindy Guadeloupe,  MD .  sodium chloride flush (NS) 0.9 % injection 10 mL, 10 mL, Intravenous, PRN, Sindy Guadeloupe, MD .  sodium chloride flush (NS) 0.9 % injection 10 mL, 10 mL, Intravenous, PRN, Sindy Guadeloupe, MD, 10 mL at 08/31/19 1123  Physical exam:  Vitals:   09/14/19 0903  BP: 118/71  Pulse: (!) 110  Temp: (!) 97.4 F (36.3 C)  TempSrc: Tympanic  SpO2: 95%  Weight: 170 lb (77.1 kg)   Physical Exam HENT:     Head: Normocephalic and atraumatic.  Eyes:     Pupils: Pupils are equal, round, and reactive to light.  Neck:     Musculoskeletal: Normal range of motion.  Cardiovascular:     Rate and Rhythm: Normal rate and regular rhythm.     Heart sounds: Normal heart sounds.  Pulmonary:     Effort: Pulmonary effort is normal.     Comments: Scattered bilateral wheezing Abdominal:     General: Bowel sounds are normal.     Palpations: Abdomen is soft.  Skin:    General: Skin is warm and dry.  Neurological:     Mental Status: He is alert and oriented to person, place, and time.      CMP Latest Ref Rng & Units 09/14/2019  Glucose 70 - 99 mg/dL 225(H)  BUN 8 - 23 mg/dL 11  Creatinine 0.61 - 1.24 mg/dL 1.16  Sodium 135 - 145 mmol/L 136  Potassium 3.5 - 5.1 mmol/L 3.6  Chloride 98 - 111 mmol/L 105  CO2 22 - 32 mmol/L 21(L)  Calcium 8.9 - 10.3 mg/dL 8.8(L)  Total Protein 6.5 - 8.1 g/dL 6.3(L)  Total Bilirubin 0.3 - 1.2 mg/dL 0.4  Alkaline Phos 38 - 126 U/L 100  AST 15 - 41 U/L 20  ALT 0 - 44 U/L 18   CBC Latest Ref Rng & Units 09/14/2019  WBC 4.0 - 10.5 K/uL 18.8(H)  Hemoglobin 13.0 - 17.0 g/dL 7.1(L)  Hematocrit 39.0 - 52.0 % 23.0(L)  Platelets 150 - 400 K/uL 36(L)    No images are attached to the encounter.  Dg Fluoro Guide Cv Line Left  Result Date: 08/20/2019 INDICATION: Inability to obtain blood return.  Evaluate for patency. EXAM: Port-A-Cath check. MEDICATIONS: None. ANESTHESIA/SEDATION: None. FLUOROSCOPY TIME:  Fluoroscopy Time: 0 minutes minutes 30  seconds (20.20 mGy). COMPLICATIONS: None immediate. PROCEDURE: Through the existing port 10 cc of Omnipaque 300 was administered. No evidence of catheter fracture. Catheter tip is positioned in the superior vena cava. The port is widely patent. Small fibrin sheath cannot be completely excluded. IMPRESSION: Port-A-Cath widely patent with tip in superior vena cava. A small fibrin sheath cannot be excluded. Electronically Signed   By: Marcello Moores  Register   On: 08/20/2019 10:41     Assessment and plan- Patient is a 68 y.o. male with stage I large cell neuroendocrine tumor of the right lung.  He is here for toxicity check after 3 cycles of carbo etoposide chemotherapy  Patient is about 10 days from his cycle 3 of chemotherapy.  He is significantly anemic today and his hemoglobin has dropped down to 7.1.  I will therefore give him 1 unit of PRBC today.  He has 1 more cycle of chemotherapy to go and hopefully his counts will recover after that.  Anemia work-up was otherwise unrevealing and consistent with chemo-induced anemia.  Chemo-induced thrombocytopenia: Counts are at his nadir today and expected to recover over the next 2 weeks.  I will see him back  in 2 weeks time with CBC with differential, CMP for cycle 4 of carbo etoposide chemotherapy and will consider giving him Neulasta with that cycle.   Visit Diagnosis 1. Symptomatic anemia   2. Antineoplastic chemotherapy induced anemia   3. Chemotherapy-induced thrombocytopenia      Dr. Randa Evens, MD, MPH [facility] [phone] 09/16/2019 8:01 AM

## 2019-09-17 ENCOUNTER — Other Ambulatory Visit: Payer: Self-pay | Admitting: Cardiovascular Disease

## 2019-09-24 ENCOUNTER — Telehealth: Payer: Self-pay

## 2019-09-24 NOTE — Telephone Encounter (Signed)
Called lmom informing patient of appointment. klh 

## 2019-09-28 ENCOUNTER — Ambulatory Visit: Payer: Medicare Other | Admitting: Internal Medicine

## 2019-09-28 ENCOUNTER — Other Ambulatory Visit: Payer: Self-pay

## 2019-09-28 ENCOUNTER — Encounter: Payer: Self-pay | Admitting: Oncology

## 2019-09-28 ENCOUNTER — Inpatient Hospital Stay (HOSPITAL_BASED_OUTPATIENT_CLINIC_OR_DEPARTMENT_OTHER): Payer: Medicare Other | Admitting: Oncology

## 2019-09-28 ENCOUNTER — Inpatient Hospital Stay: Payer: Medicare Other

## 2019-09-28 VITALS — Resp 20

## 2019-09-28 VITALS — BP 132/73 | HR 95 | Temp 96.0°F | Wt 173.7 lb

## 2019-09-28 DIAGNOSIS — Z7982 Long term (current) use of aspirin: Secondary | ICD-10-CM | POA: Diagnosis not present

## 2019-09-28 DIAGNOSIS — R5383 Other fatigue: Secondary | ICD-10-CM | POA: Diagnosis not present

## 2019-09-28 DIAGNOSIS — G4730 Sleep apnea, unspecified: Secondary | ICD-10-CM | POA: Diagnosis not present

## 2019-09-28 DIAGNOSIS — I2510 Atherosclerotic heart disease of native coronary artery without angina pectoris: Secondary | ICD-10-CM | POA: Diagnosis not present

## 2019-09-28 DIAGNOSIS — R0789 Other chest pain: Secondary | ICD-10-CM | POA: Diagnosis not present

## 2019-09-28 DIAGNOSIS — Z85819 Personal history of malignant neoplasm of unspecified site of lip, oral cavity, and pharynx: Secondary | ICD-10-CM | POA: Diagnosis not present

## 2019-09-28 DIAGNOSIS — C3411 Malignant neoplasm of upper lobe, right bronchus or lung: Secondary | ICD-10-CM

## 2019-09-28 DIAGNOSIS — C7A8 Other malignant neuroendocrine tumors: Secondary | ICD-10-CM | POA: Diagnosis not present

## 2019-09-28 DIAGNOSIS — D6481 Anemia due to antineoplastic chemotherapy: Secondary | ICD-10-CM | POA: Diagnosis not present

## 2019-09-28 DIAGNOSIS — R062 Wheezing: Secondary | ICD-10-CM | POA: Diagnosis not present

## 2019-09-28 DIAGNOSIS — E119 Type 2 diabetes mellitus without complications: Secondary | ICD-10-CM | POA: Diagnosis not present

## 2019-09-28 DIAGNOSIS — I10 Essential (primary) hypertension: Secondary | ICD-10-CM | POA: Diagnosis not present

## 2019-09-28 DIAGNOSIS — T451X5A Adverse effect of antineoplastic and immunosuppressive drugs, initial encounter: Secondary | ICD-10-CM | POA: Diagnosis not present

## 2019-09-28 DIAGNOSIS — C7A090 Malignant carcinoid tumor of the bronchus and lung: Secondary | ICD-10-CM | POA: Diagnosis not present

## 2019-09-28 DIAGNOSIS — Z7984 Long term (current) use of oral hypoglycemic drugs: Secondary | ICD-10-CM | POA: Diagnosis not present

## 2019-09-28 DIAGNOSIS — Z5111 Encounter for antineoplastic chemotherapy: Secondary | ICD-10-CM

## 2019-09-28 DIAGNOSIS — J449 Chronic obstructive pulmonary disease, unspecified: Secondary | ICD-10-CM | POA: Diagnosis not present

## 2019-09-28 DIAGNOSIS — E785 Hyperlipidemia, unspecified: Secondary | ICD-10-CM | POA: Diagnosis not present

## 2019-09-28 DIAGNOSIS — Z85820 Personal history of malignant melanoma of skin: Secondary | ICD-10-CM | POA: Diagnosis not present

## 2019-09-28 DIAGNOSIS — R5381 Other malaise: Secondary | ICD-10-CM | POA: Diagnosis not present

## 2019-09-28 DIAGNOSIS — D6959 Other secondary thrombocytopenia: Secondary | ICD-10-CM | POA: Diagnosis not present

## 2019-09-28 DIAGNOSIS — Z79899 Other long term (current) drug therapy: Secondary | ICD-10-CM | POA: Diagnosis not present

## 2019-09-28 LAB — COMPREHENSIVE METABOLIC PANEL
ALT: 14 U/L (ref 0–44)
AST: 16 U/L (ref 15–41)
Albumin: 3.2 g/dL — ABNORMAL LOW (ref 3.5–5.0)
Alkaline Phosphatase: 77 U/L (ref 38–126)
Anion gap: 8 (ref 5–15)
BUN: 9 mg/dL (ref 8–23)
CO2: 26 mmol/L (ref 22–32)
Calcium: 9 mg/dL (ref 8.9–10.3)
Chloride: 102 mmol/L (ref 98–111)
Creatinine, Ser: 1.05 mg/dL (ref 0.61–1.24)
GFR calc Af Amer: >60 mL/min (ref >60)
GFR calc non Af Amer: >60 mL/min (ref >60)
Glucose, Bld: 181 mg/dL — ABNORMAL HIGH (ref 70–99)
Potassium: 4.2 mmol/L (ref 3.5–5.1)
Sodium: 136 mmol/L (ref 135–145)
Total Bilirubin: 0.7 mg/dL (ref 0.3–1.2)
Total Protein: 6.7 g/dL (ref 6.5–8.1)

## 2019-09-28 LAB — CBC WITH DIFFERENTIAL/PLATELET
Abs Immature Granulocytes: 0.22 10*3/uL — ABNORMAL HIGH (ref 0.00–0.07)
Basophils Absolute: 0.1 10*3/uL (ref 0.0–0.1)
Basophils Relative: 1 %
Eosinophils Absolute: 0 10*3/uL (ref 0.0–0.5)
Eosinophils Relative: 0 %
HCT: 29.2 % — ABNORMAL LOW (ref 39.0–52.0)
Hemoglobin: 8.9 g/dL — ABNORMAL LOW (ref 13.0–17.0)
Immature Granulocytes: 2 %
Lymphocytes Relative: 8 %
Lymphs Abs: 1.2 10*3/uL (ref 0.7–4.0)
MCH: 29.7 pg (ref 26.0–34.0)
MCHC: 30.5 g/dL (ref 30.0–36.0)
MCV: 97.3 fL (ref 80.0–100.0)
Monocytes Absolute: 1.9 10*3/uL — ABNORMAL HIGH (ref 0.1–1.0)
Monocytes Relative: 13 %
Neutro Abs: 11.2 10*3/uL — ABNORMAL HIGH (ref 1.7–7.7)
Neutrophils Relative %: 76 %
Platelets: 255 10*3/uL (ref 150–400)
RBC: 3 MIL/uL — ABNORMAL LOW (ref 4.22–5.81)
RDW: 20 % — ABNORMAL HIGH (ref 11.5–15.5)
WBC: 14.7 10*3/uL — ABNORMAL HIGH (ref 4.0–10.5)
nRBC: 0 % (ref 0.0–0.2)

## 2019-09-28 MED ORDER — SODIUM CHLORIDE 0.9% FLUSH
10.0000 mL | Freq: Once | INTRAVENOUS | Status: AC
  Administered 2019-09-28: 10 mL via INTRAVENOUS
  Filled 2019-09-28: qty 10

## 2019-09-28 MED ORDER — SODIUM CHLORIDE 0.9% FLUSH
10.0000 mL | INTRAVENOUS | Status: DC | PRN
  Administered 2019-09-28: 10 mL via INTRAVENOUS
  Filled 2019-09-28: qty 10

## 2019-09-28 MED ORDER — SODIUM CHLORIDE 0.9 % IV SOLN
Freq: Once | INTRAVENOUS | Status: AC
  Filled 2019-09-28: qty 5

## 2019-09-28 MED ORDER — HEPARIN SOD (PORK) LOCK FLUSH 100 UNIT/ML IV SOLN
500.0000 [IU] | Freq: Once | INTRAVENOUS | Status: AC
  Administered 2019-09-28: 500 [IU] via INTRAVENOUS
  Filled 2019-09-28: qty 5

## 2019-09-28 MED ORDER — PALONOSETRON HCL INJECTION 0.25 MG/5ML
0.2500 mg | Freq: Once | INTRAVENOUS | Status: AC
  Administered 2019-09-28: 0.25 mg via INTRAVENOUS
  Filled 2019-09-28: qty 5

## 2019-09-28 MED ORDER — SODIUM CHLORIDE 0.9 % IV SOLN
404.4000 mg | Freq: Once | INTRAVENOUS | Status: AC
  Administered 2019-09-28: 400 mg via INTRAVENOUS
  Filled 2019-09-28: qty 40

## 2019-09-28 MED ORDER — SODIUM CHLORIDE 0.9 % IV SOLN
100.0000 mg/m2 | Freq: Once | INTRAVENOUS | Status: AC
  Administered 2019-09-28: 200 mg via INTRAVENOUS
  Filled 2019-09-28: qty 10

## 2019-09-28 MED ORDER — SODIUM CHLORIDE 0.9 % IV SOLN
Freq: Once | INTRAVENOUS | Status: AC
  Filled 2019-09-28: qty 250

## 2019-09-28 NOTE — Progress Notes (Signed)
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 As a part of his staging work-up he underwent CT chest as well as CT neck. 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 CT neck was unremarkable however CT chest showed a right upper lobe nodule 2.1 x 1.7 cm in with minimal spiculation as well as a 0.5 cm right lower lobe nodule. No mediastinal or hilar adenopathy was noted. Patient was referred to [facility] for further work-up given his preference. He underwent a PET CT scan on 06/10/2019 which showed a hypermetabolic right upper lobe lung nodule measuring 2 cm. Equivocal right infrahilar hypermetabolism with an SUV of 3.1. 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 Large right kidney mass measuring 9.7 x 9 cm. Patient underwent CT-guided lung biopsy which was consistent with large cell neuroendocrine carcinoma.  Concurrent chemoradiation started on 10/6/2020with carboplatin and etoposide   Interval history-reports ongoing fatigue.  Denies other complaints at this time.  Denies any fever cough or worsening shortness of breath  ECOG PS- 1 Pain scale- 0 Opioid associated constipation- no  Review of systems- Review of Systems  Constitutional: Positive for  malaise/fatigue. Negative for chills, fever and weight loss.  HENT: Negative for congestion, ear discharge and nosebleeds.   Eyes: Negative for blurred vision.  Respiratory: Negative for cough, hemoptysis, sputum production, shortness of breath and wheezing.   Cardiovascular: Negative for chest pain, palpitations, orthopnea and claudication.  Gastrointestinal: Negative for abdominal pain, blood in stool, constipation, diarrhea, heartburn, melena, nausea and vomiting.  Genitourinary: Negative for dysuria, flank pain, frequency, hematuria and urgency.  Musculoskeletal: Negative for back pain, joint pain and myalgias.  Skin: Negative for rash.  Neurological: Negative for dizziness, tingling, focal weakness, seizures, weakness and headaches.  Endo/Heme/Allergies: Does not bruise/bleed easily.  Psychiatric/Behavioral: Negative for depression and suicidal ideas. The patient does not have insomnia.       Allergies  Allergen Reactions  . Codeine Nausea And Vomiting and Other (See Comments)    Pt states when he takes codeine, he throws up blood  . Niacin And Related Other (See Comments)    "made his body feel hot & he had a hard time breathing"     Past Medical History:  Diagnosis Date  . Anxiety   . COPD (chronic obstructive pulmonary disease) ([place])   . Coronary artery disease 2006   Acute coronary syndrome in March 2006.   . Diabetes mellitus without complication ([place])   . Dyslipidemia   . Hypertension 10/09/2010   EF 40-45%  . Lung cancer ([place])   . 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 Sleep apnea    uses cpap machine     Past Surgical History:  Procedure Laterality Date  . Arterial evalation  no evidence of ICA stenosis  . CARDIAC CATHETERIZATION    . CARDIAC SURGERY    . CORONARY ARTERY BYPASS GRAFT  2006   Post CABG surgery with a LIMA to the LAD, a vein to the diagonal ,,a vein to the the obtuse marginal and vein to the PDA.  [name] CORONARY STENT PLACEMENT   2010  . EAR CYST EXCISION Right 10/05/2018   Procedure: Right Partial pinnectomy;  Surgeon: Izora Gala, MD;  Location: [place];  Service: ENT;  Laterality: Right;  . GRAFT(S) ANGIOGRAM  10/21/2011   Procedure: GRAFT(S) Cyril Loosen;  Surgeon: Leonie Man, MD;  Location: [facility] CATH LAB;  Service: Cardiovascular;;  . LEFT HEART CATHETERIZATION WITH CORONARY ANGIOGRAM N/A 10/21/2011   Procedure: LEFT HEART CATHETERIZATION WITH CORONARY ANGIOGRAM;  Surgeon: Leonie Man, MD;  Location: [facility] CATH LAB;  Service: Cardiovascular;  Laterality: N/A;  . LYMPH NODE BIOPSY Right 10/05/2018   Procedure: sentinel NODE BIOPSY with right partial modified neck dissection;  Surgeon: Izora Gala, MD;  Location: [facility];  Service: ENT;  Laterality: Right;  . PORTA CATH INSERTION N/A 07/12/2019   Procedure: PORTA CATH INSERTION;  Surgeon: Algernon Huxley, MD;  Location: [place] CV LAB;  Service: Cardiovascular;  Laterality: N/A;    Social History   Socioeconomic History  . Marital status: Married    Spouse name: JoAnne Mohs  . Number of children: Not on file  . Years of education: Not on file  . Highest education level: Not on file  Occupational History  . Not on file  Tobacco Use  . Smoking status: Current Every Day Smoker    Packs/day: 2.00    Years: 50.00    Pack years: 100.00    Types: Cigarettes  . Smokeless tobacco: Former Systems developer    Types: [place] date: 01/23/2013  Substance and Sexual Activity  . Alcohol use: No  . Drug use: Not Currently  . Sexual activity: Not Currently  Other Topics Concern  . Not on file  Social History Narrative   Lives with son and grandson   Social Determinants of Health   Financial Resource Strain:   . Difficulty of Paying Living Expenses: Not on file  Food Insecurity:   . Worried About Charity fundraiser in the Last Year: Not on file  . Ran Out of Food in the Last Year: Not on file  Transportation Needs:   . Lack of Transportation (Medical): Not on file   . Lack of Transportation (Non-Medical): Not on file  Physical Activity:   . Days of Exercise per Week: Not on file  . Minutes of Exercise per Session: Not on file  Stress:   . Feeling of Stress : Not on file  Social Connections:   . Frequency of Communication with Friends and Family: Not on file  . Frequency of Social Gatherings with Friends and Family: Not on file  . Attends Religious Services: Not on file  . Active Member of Clubs or Organizations: Not on file  . Attends Archivist Meetings: Not on file  . Marital Status: Not on file  Intimate Partner Violence:   . Fear of Current or Ex-Partner: Not on file  . Emotionally Abused: Not on file  . Physically Abused: Not on file  . Sexually Abused: Not on file    Family History  Problem Relation Age of Onset  . Hypertension Mother   . Heart attack Mother   . Heart attack Father  47     Current Outpatient Medications:  .  ALPRAZolam (XANAX) 0.25 MG tablet, Take 0.25 mg by mouth 3 (three) times daily. , Disp: , Rfl:  .  amLODipine (NORVASC) 10 MG tablet, Take 10 mg by mouth daily., Disp: , Rfl:  .  aspirin EC 81 MG tablet, Take 81 mg by mouth daily., Disp: , Rfl:  .  atorvastatin (LIPITOR) 40 MG tablet, TAKE 1 TABLET (40 MG TOTAL) BY MOUTH AT BEDTIME. KEEP OCTOBER APPOINTMENT, Disp: 90 tablet, Rfl: 0 .  ezetimibe (ZETIA) 10 MG tablet, TAKE 1 TABLET BY MOUTH EVERY DAY, Disp: 90 tablet, Rfl: 3 .  Ipratropium-Albuterol (COMBIVENT RESPIMAT) 20-100 MCG/ACT AERS respimat, Inhale 1 puff into the lungs every 6 (six) hours., Disp: , Rfl:  .  lidocaine-prilocaine (EMLA) cream, Apply to affected area once, Disp: 30 g, Rfl: 3 .  LORazepam (ATIVAN) 0.5 MG tablet, Take 1 tablet 30 minutes prior to each scan, Disp: 2 tablet, Rfl: 0 .  magic mouthwash w/lidocaine SOLN, Take 5 mLs by mouth 4 (four) times daily as needed for mouth pain., Disp: 460 mL, Rfl: 0 .  metFORMIN (GLUCOPHAGE) 500 MG tablet, TAKE 1 TABLET (500 MG TOTAL) BY MOUTH 2  (TWO) TIMES DAILY WITH A MEAL., Disp: 180 tablet, Rfl: 0 .  nitroGLYCERIN (NITROSTAT) 0.4 MG SL tablet, Place 0.4 mg under the tongue every 5 (five) minutes as needed for chest pain., Disp: , Rfl:  .  nystatin (MYCOSTATIN) 100000 UNIT/ML suspension, Take 5 mLs (500,000 Units total) by mouth 4 (four) times daily., Disp: 473 mL, Rfl: 0 .  ondansetron (ZOFRAN) 8 MG tablet, Take 1 tablet (8 mg total) by mouth 2 (two) times daily as needed for refractory nausea / vomiting. Start on day 3 after carboplatin chemo., Disp: 30 tablet, Rfl: 1 .  prochlorperazine (COMPAZINE) 10 MG tablet, Take 1 tablet (10 mg total) by mouth every 6 (six) hours as needed (Nausea or vomiting)., Disp: 30 tablet, Rfl: 1 .  ranolazine (RANEXA) 1000 MG SR tablet, TAKE 1 TABLET BY MOUTH TWICE A DAY, Disp: 60 tablet, Rfl: 11 .  Baclofen 5 MG TABS, Take 5 mg by mouth 3 (three) times daily. If 5 mg tid is not working then can increase to 10 mg tid (Patient not taking: Reported on 09/28/2019), Disp: 90 tablet, Rfl: 0 .  busPIRone (BUSPAR) 7.5 MG tablet, Take 1 tablet by mouth 2 (two) times daily., Disp: , Rfl:  .  dexamethasone (DECADRON) 4 MG tablet, Take 2 tablets (8 mg total) by mouth daily. Start the day after chemotherapy for 1 day. (Patient not taking: Reported on 09/28/2019), Disp: 30 tablet, Rfl: 1 .  famotidine (PEPCID) 20 MG tablet, TAKE 1 TABLET (20 MG TOTAL) BY MOUTH DAILY. NEEDS APPOINTMENT FOR FUTURE REFILLS (Patient not taking: Reported on 09/28/2019), Disp: 90 tablet, Rfl: 2 .  INCRUSE ELLIPTA 62.5 MCG/INH AEPB, Inhale 1 puff into the lungs daily. , Disp: , Rfl: 2 .  isosorbide mononitrate (IMDUR) 60 MG 24 hr tablet, TAKE 1 TABLET BY MOUTH EVERY DAY (Patient not taking: Reported on 09/28/2019), Disp: 90 tablet, Rfl: 1 .  losartan-hydrochlorothiazide (HYZAAR) 100-12.5 MG tablet, Take 1 tablet by mouth daily. (Patient not taking: Reported on 09/28/2019), Disp: 30 tablet, Rfl: 10 .  traMADol (ULTRAM) 50 MG tablet, Take 1 tablet  (50 mg total) by mouth every 6 (six) hours as needed. (Patient not taking: Reported on 09/28/2019), Disp: 30 tablet, Rfl: 0 No current facility-administered medications for this visit.  Facility-Administered Medications  Ordered in Other Visits:  .  CARBOplatin (PARAPLATIN) 400 mg in sodium chloride 0.9 % 250 mL chemo infusion, 400 mg, Intravenous, Once, Sindy Guadeloupe, MD, Last Rate: 580 mL/hr at 09/28/19 1254, 400 mg at 09/28/19 1254 .  heparin lock flush 100 unit/mL, 500 Units, Intravenous, Once, Sindy Guadeloupe, MD .  heparin lock flush 100 unit/mL, 500 Units, Intravenous, Once, Sindy Guadeloupe, MD .  sodium chloride flush (NS) 0.9 % injection 10 mL, 10 mL, Intravenous, PRN, Sindy Guadeloupe, MD .  sodium chloride flush (NS) 0.9 % injection 10 mL, 10 mL, Intravenous, PRN, Sindy Guadeloupe, MD, 10 mL at 08/31/19 1123 .  sodium chloride flush (NS) 0.9 % injection 10 mL, 10 mL, Intravenous, PRN, Sindy Guadeloupe, MD, 10 mL at 09/28/19 1030  Physical exam:  Vitals:   09/28/19 0948  BP: 132/73  Pulse: 95  Temp: (!) 96 F (35.6 C)  TempSrc: Tympanic  Weight: 173 lb 11.2 oz (78.8 kg)   Physical Exam Constitutional:      General: He is not in acute distress. HENT:     Head: Normocephalic and atraumatic.  Eyes:     Pupils: Pupils are equal, round, and reactive to light.  Cardiovascular:     Rate and Rhythm: Normal rate and regular rhythm.     Heart sounds: Normal heart sounds.  Pulmonary:     Effort: Pulmonary effort is normal.     Breath sounds: Normal breath sounds.  Abdominal:     General: Bowel sounds are normal.     Palpations: Abdomen is soft.  Musculoskeletal:     Cervical back: Normal range of motion.  Skin:    General: Skin is warm and dry.  Neurological:     Mental Status: He is alert and oriented to person, place, and time.      CMP Latest Ref Rng & Units 09/28/2019  Glucose 70 - 99 mg/dL 181(H)  BUN 8 - 23 mg/dL 9  Creatinine 0.61 - 1.24 mg/dL 1.05  Sodium 135 - 145  mmol/L 136  Potassium 3.5 - 5.1 mmol/L 4.2  Chloride 98 - 111 mmol/L 102  CO2 22 - 32 mmol/L 26  Calcium 8.9 - 10.3 mg/dL 9.0  Total Protein 6.5 - 8.1 g/dL 6.7  Total Bilirubin 0.3 - 1.2 mg/dL 0.7  Alkaline Phos 38 - 126 U/L 77  AST 15 - 41 U/L 16  ALT 0 - 44 U/L 14   CBC Latest Ref Rng & Units 09/28/2019  WBC 4.0 - 10.5 K/uL 14.7(H)  Hemoglobin 13.0 - 17.0 g/dL 8.9(L)  Hematocrit 39.0 - 52.0 % 29.2(L)  Platelets 150 - 400 K/uL 255     Assessment and plan- Patient is a 68 y.o. male with stage I large cell neuroendocrine tumor of the right lung.    He is here for cycle 4 of carboplatin and etoposide chemotherapy.  He has completed concurrent radiation treatment.  Counts okay to proceed with cycle 4 of carboplatin etoposide chemotherapy today.  His platelets have normalized.  Since his white cell count is up to 14 and this is his last cycle of chemo I will hold off on giving him on pro-Neulasta.  I will obtain CT chest abdomen and pelvis with contrast 2 weeks from now.  Chemo-induced anemia: Repeat CBC and hold tube in 2 weeks for possible blood transfusion.  I expect his anemia to improve over the next several weeks after chemotherapy is stopped.   Visit Diagnosis 1. Malignant  neoplasm of upper lobe of right lung (HCC)   2. Large cell neuroendocrine carcinoma ([place])   3. Encounter for antineoplastic chemotherapy   4. Antineoplastic chemotherapy induced anemia      Dr. Randa Evens, MD, MPH [facility] at [facility] [phone] 09/28/2019 1:21 PM

## 2019-09-28 NOTE — Progress Notes (Signed)
Pt fatigue and hurts some in lungs

## 2019-09-28 NOTE — Progress Notes (Signed)
Port does not yield blood return today. Port flushes without difficulty. No swelling, redness, or pain noted at port site. MD, Dr. Janese Banks, notified and aware. Per MD order: port can be used for Etoposide/Carboplatin treatment today. Per MD order: [facility] may also be used for Etoposide treatment tomorrow, 09/29/2019, and Thursday, 09/30/2019, pending port flushes without difficulty and no swelling, redness, or pain is noted at port site on these days.

## 2019-09-28 NOTE — Progress Notes (Signed)
Confirmed carbo dose of 400mg . Cr improved since initial dosing (last dose of 350mg  is ~13.3% difference). MD ok to continue on dose of 400mg .

## 2019-09-29 ENCOUNTER — Ambulatory Visit: Payer: Medicare Other | Admitting: Cardiovascular Disease

## 2019-09-29 ENCOUNTER — Other Ambulatory Visit: Payer: Self-pay

## 2019-09-29 ENCOUNTER — Inpatient Hospital Stay: Payer: Medicare Other

## 2019-09-29 VITALS — BP 144/78 | HR 98 | Temp 97.0°F | Resp 18

## 2019-09-29 DIAGNOSIS — I10 Essential (primary) hypertension: Secondary | ICD-10-CM | POA: Diagnosis not present

## 2019-09-29 DIAGNOSIS — Z85819 Personal history of malignant neoplasm of unspecified site of lip, oral cavity, and pharynx: Secondary | ICD-10-CM | POA: Diagnosis not present

## 2019-09-29 DIAGNOSIS — D6959 Other secondary thrombocytopenia: Secondary | ICD-10-CM | POA: Diagnosis not present

## 2019-09-29 DIAGNOSIS — Z7984 Long term (current) use of oral hypoglycemic drugs: Secondary | ICD-10-CM | POA: Diagnosis not present

## 2019-09-29 DIAGNOSIS — E785 Hyperlipidemia, unspecified: Secondary | ICD-10-CM | POA: Diagnosis not present

## 2019-09-29 DIAGNOSIS — G4730 Sleep apnea, unspecified: Secondary | ICD-10-CM | POA: Diagnosis not present

## 2019-09-29 DIAGNOSIS — E119 Type 2 diabetes mellitus without complications: Secondary | ICD-10-CM | POA: Diagnosis not present

## 2019-09-29 DIAGNOSIS — C7A090 Malignant carcinoid tumor of the bronchus and lung: Secondary | ICD-10-CM | POA: Diagnosis not present

## 2019-09-29 DIAGNOSIS — Z7982 Long term (current) use of aspirin: Secondary | ICD-10-CM | POA: Diagnosis not present

## 2019-09-29 DIAGNOSIS — R0789 Other chest pain: Secondary | ICD-10-CM | POA: Diagnosis not present

## 2019-09-29 DIAGNOSIS — Z5111 Encounter for antineoplastic chemotherapy: Secondary | ICD-10-CM | POA: Diagnosis not present

## 2019-09-29 DIAGNOSIS — Z79899 Other long term (current) drug therapy: Secondary | ICD-10-CM | POA: Diagnosis not present

## 2019-09-29 DIAGNOSIS — C3411 Malignant neoplasm of upper lobe, right bronchus or lung: Secondary | ICD-10-CM

## 2019-09-29 DIAGNOSIS — R5383 Other fatigue: Secondary | ICD-10-CM | POA: Diagnosis not present

## 2019-09-29 DIAGNOSIS — J449 Chronic obstructive pulmonary disease, unspecified: Secondary | ICD-10-CM | POA: Diagnosis not present

## 2019-09-29 DIAGNOSIS — R5381 Other malaise: Secondary | ICD-10-CM | POA: Diagnosis not present

## 2019-09-29 DIAGNOSIS — R062 Wheezing: Secondary | ICD-10-CM | POA: Diagnosis not present

## 2019-09-29 DIAGNOSIS — T451X5A Adverse effect of antineoplastic and immunosuppressive drugs, initial encounter: Secondary | ICD-10-CM | POA: Diagnosis not present

## 2019-09-29 DIAGNOSIS — I2510 Atherosclerotic heart disease of native coronary artery without angina pectoris: Secondary | ICD-10-CM | POA: Diagnosis not present

## 2019-09-29 DIAGNOSIS — Z85820 Personal history of malignant melanoma of skin: Secondary | ICD-10-CM | POA: Diagnosis not present

## 2019-09-29 DIAGNOSIS — D6481 Anemia due to antineoplastic chemotherapy: Secondary | ICD-10-CM | POA: Diagnosis not present

## 2019-09-29 MED ORDER — SODIUM CHLORIDE 0.9 % IV SOLN
Freq: Once | INTRAVENOUS | Status: AC
  Filled 2019-09-29: qty 250

## 2019-09-29 MED ORDER — SODIUM CHLORIDE 0.9 % IV SOLN
100.0000 mg/m2 | Freq: Once | INTRAVENOUS | Status: AC
  Administered 2019-09-29: 200 mg via INTRAVENOUS
  Filled 2019-09-29: qty 10

## 2019-09-29 MED ORDER — HEPARIN SOD (PORK) LOCK FLUSH 100 UNIT/ML IV SOLN
500.0000 [IU] | Freq: Once | INTRAVENOUS | Status: AC | PRN
  Administered 2019-09-29: 500 [IU]
  Filled 2019-09-29: qty 5

## 2019-09-29 MED ORDER — DEXAMETHASONE SODIUM PHOSPHATE 10 MG/ML IJ SOLN
10.0000 mg | Freq: Once | INTRAMUSCULAR | Status: AC
  Administered 2019-09-29: 10 mg via INTRAVENOUS
  Filled 2019-09-29: qty 1

## 2019-09-30 ENCOUNTER — Other Ambulatory Visit: Payer: Self-pay

## 2019-09-30 ENCOUNTER — Inpatient Hospital Stay: Payer: Medicare Other

## 2019-09-30 VITALS — BP 139/82 | HR 88 | Temp 97.5°F | Resp 18

## 2019-09-30 DIAGNOSIS — C3411 Malignant neoplasm of upper lobe, right bronchus or lung: Secondary | ICD-10-CM

## 2019-09-30 DIAGNOSIS — Z7984 Long term (current) use of oral hypoglycemic drugs: Secondary | ICD-10-CM | POA: Diagnosis not present

## 2019-09-30 DIAGNOSIS — E785 Hyperlipidemia, unspecified: Secondary | ICD-10-CM | POA: Diagnosis not present

## 2019-09-30 DIAGNOSIS — D6481 Anemia due to antineoplastic chemotherapy: Secondary | ICD-10-CM | POA: Diagnosis not present

## 2019-09-30 DIAGNOSIS — J449 Chronic obstructive pulmonary disease, unspecified: Secondary | ICD-10-CM | POA: Diagnosis not present

## 2019-09-30 DIAGNOSIS — R5381 Other malaise: Secondary | ICD-10-CM | POA: Diagnosis not present

## 2019-09-30 DIAGNOSIS — R0789 Other chest pain: Secondary | ICD-10-CM | POA: Diagnosis not present

## 2019-09-30 DIAGNOSIS — C7A090 Malignant carcinoid tumor of the bronchus and lung: Secondary | ICD-10-CM | POA: Diagnosis not present

## 2019-09-30 DIAGNOSIS — Z85820 Personal history of malignant melanoma of skin: Secondary | ICD-10-CM | POA: Diagnosis not present

## 2019-09-30 DIAGNOSIS — I2510 Atherosclerotic heart disease of native coronary artery without angina pectoris: Secondary | ICD-10-CM | POA: Diagnosis not present

## 2019-09-30 DIAGNOSIS — I10 Essential (primary) hypertension: Secondary | ICD-10-CM | POA: Diagnosis not present

## 2019-09-30 DIAGNOSIS — R5383 Other fatigue: Secondary | ICD-10-CM | POA: Diagnosis not present

## 2019-09-30 DIAGNOSIS — Z7982 Long term (current) use of aspirin: Secondary | ICD-10-CM | POA: Diagnosis not present

## 2019-09-30 DIAGNOSIS — R062 Wheezing: Secondary | ICD-10-CM | POA: Diagnosis not present

## 2019-09-30 DIAGNOSIS — T451X5A Adverse effect of antineoplastic and immunosuppressive drugs, initial encounter: Secondary | ICD-10-CM | POA: Diagnosis not present

## 2019-09-30 DIAGNOSIS — G4730 Sleep apnea, unspecified: Secondary | ICD-10-CM | POA: Diagnosis not present

## 2019-09-30 DIAGNOSIS — D6959 Other secondary thrombocytopenia: Secondary | ICD-10-CM | POA: Diagnosis not present

## 2019-09-30 DIAGNOSIS — Z5111 Encounter for antineoplastic chemotherapy: Secondary | ICD-10-CM | POA: Diagnosis not present

## 2019-09-30 DIAGNOSIS — Z85819 Personal history of malignant neoplasm of unspecified site of lip, oral cavity, and pharynx: Secondary | ICD-10-CM | POA: Diagnosis not present

## 2019-09-30 DIAGNOSIS — Z79899 Other long term (current) drug therapy: Secondary | ICD-10-CM | POA: Diagnosis not present

## 2019-09-30 DIAGNOSIS — E119 Type 2 diabetes mellitus without complications: Secondary | ICD-10-CM | POA: Diagnosis not present

## 2019-09-30 MED ORDER — DEXAMETHASONE SODIUM PHOSPHATE 10 MG/ML IJ SOLN
10.0000 mg | Freq: Once | INTRAMUSCULAR | Status: AC
  Administered 2019-09-30: 10 mg via INTRAVENOUS
  Filled 2019-09-30: qty 1

## 2019-09-30 MED ORDER — HEPARIN SOD (PORK) LOCK FLUSH 100 UNIT/ML IV SOLN
500.0000 [IU] | Freq: Once | INTRAVENOUS | Status: AC | PRN
  Administered 2019-09-30: 500 [IU]
  Filled 2019-09-30: qty 5

## 2019-09-30 MED ORDER — SODIUM CHLORIDE 0.9 % IV SOLN
Freq: Once | INTRAVENOUS | Status: AC
  Filled 2019-09-30: qty 250

## 2019-09-30 MED ORDER — SODIUM CHLORIDE 0.9% FLUSH
10.0000 mL | INTRAVENOUS | Status: DC | PRN
  Administered 2019-09-30: 10 mL
  Filled 2019-09-30: qty 10

## 2019-09-30 MED ORDER — SODIUM CHLORIDE 0.9 % IV SOLN
100.0000 mg/m2 | Freq: Once | INTRAVENOUS | Status: AC
  Administered 2019-09-30: 200 mg via INTRAVENOUS
  Filled 2019-09-30: qty 10

## 2019-09-30 MED ORDER — HEPARIN SOD (PORK) LOCK FLUSH 100 UNIT/ML IV SOLN
INTRAVENOUS | Status: AC
  Filled 2019-09-30: qty 5

## 2019-10-01 ENCOUNTER — Other Ambulatory Visit: Payer: Self-pay | Admitting: Cardiovascular Disease

## 2019-10-11 ENCOUNTER — Telehealth: Payer: Self-pay | Admitting: *Deleted

## 2019-10-11 DIAGNOSIS — Z5189 Encounter for other specified aftercare: Secondary | ICD-10-CM

## 2019-10-11 NOTE — Telephone Encounter (Signed)
Called patient's wife and asked if she was able to bring her husband in today and she stated that she was not home and heading to the grocery store, therefore, not able to bring him in. I told her that we could see him tomorrow after he had his CT Scan. Patient's wife agreed. Appointment was scheduled and informed to patient's wife.

## 2019-10-11 NOTE — Addendum Note (Signed)
Addended by: Betti Cruz on: 10/11/2019 03:06 PM   Modules accepted: Orders

## 2019-10-11 NOTE — Telephone Encounter (Signed)
Call from family stating that is sick and dehydrated and not eating or drinking much. I got voice mail when I called for more information, so I called the daughter and was told he is sleeping a lot and is not feeling well at all. Please advise

## 2019-10-11 NOTE — Telephone Encounter (Signed)
He can be seen in symptom management today or I can see him tomorrow with labs cbc with diff/ cmp for possible fluids

## 2019-10-12 ENCOUNTER — Other Ambulatory Visit: Payer: Self-pay | Admitting: *Deleted

## 2019-10-12 ENCOUNTER — Other Ambulatory Visit: Payer: Self-pay

## 2019-10-12 ENCOUNTER — Ambulatory Visit: Admission: RE | Admit: 2019-10-12 | Payer: Medicare Other | Source: Ambulatory Visit

## 2019-10-12 ENCOUNTER — Inpatient Hospital Stay: Payer: Medicare Other

## 2019-10-12 ENCOUNTER — Inpatient Hospital Stay (HOSPITAL_BASED_OUTPATIENT_CLINIC_OR_DEPARTMENT_OTHER): Payer: Medicare Other | Admitting: Oncology

## 2019-10-12 ENCOUNTER — Encounter: Payer: Self-pay | Admitting: Oncology

## 2019-10-12 VITALS — BP 112/72 | HR 102 | Temp 96.4°F | Wt 168.0 lb

## 2019-10-12 DIAGNOSIS — D649 Anemia, unspecified: Secondary | ICD-10-CM

## 2019-10-12 DIAGNOSIS — C3411 Malignant neoplasm of upper lobe, right bronchus or lung: Secondary | ICD-10-CM

## 2019-10-12 DIAGNOSIS — I10 Essential (primary) hypertension: Secondary | ICD-10-CM | POA: Diagnosis not present

## 2019-10-12 DIAGNOSIS — G4730 Sleep apnea, unspecified: Secondary | ICD-10-CM | POA: Diagnosis not present

## 2019-10-12 DIAGNOSIS — Z79899 Other long term (current) drug therapy: Secondary | ICD-10-CM | POA: Diagnosis not present

## 2019-10-12 DIAGNOSIS — D6959 Other secondary thrombocytopenia: Secondary | ICD-10-CM | POA: Diagnosis not present

## 2019-10-12 DIAGNOSIS — G893 Neoplasm related pain (acute) (chronic): Secondary | ICD-10-CM

## 2019-10-12 DIAGNOSIS — D61810 Antineoplastic chemotherapy induced pancytopenia: Secondary | ICD-10-CM | POA: Diagnosis not present

## 2019-10-12 DIAGNOSIS — I2510 Atherosclerotic heart disease of native coronary artery without angina pectoris: Secondary | ICD-10-CM | POA: Diagnosis not present

## 2019-10-12 DIAGNOSIS — R5381 Other malaise: Secondary | ICD-10-CM | POA: Diagnosis not present

## 2019-10-12 DIAGNOSIS — Z7984 Long term (current) use of oral hypoglycemic drugs: Secondary | ICD-10-CM | POA: Diagnosis not present

## 2019-10-12 DIAGNOSIS — T451X5A Adverse effect of antineoplastic and immunosuppressive drugs, initial encounter: Secondary | ICD-10-CM

## 2019-10-12 DIAGNOSIS — Z85819 Personal history of malignant neoplasm of unspecified site of lip, oral cavity, and pharynx: Secondary | ICD-10-CM | POA: Diagnosis not present

## 2019-10-12 DIAGNOSIS — J449 Chronic obstructive pulmonary disease, unspecified: Secondary | ICD-10-CM | POA: Diagnosis not present

## 2019-10-12 DIAGNOSIS — Z85820 Personal history of malignant melanoma of skin: Secondary | ICD-10-CM | POA: Diagnosis not present

## 2019-10-12 DIAGNOSIS — R5383 Other fatigue: Secondary | ICD-10-CM | POA: Diagnosis not present

## 2019-10-12 DIAGNOSIS — R0789 Other chest pain: Secondary | ICD-10-CM | POA: Diagnosis not present

## 2019-10-12 DIAGNOSIS — R062 Wheezing: Secondary | ICD-10-CM | POA: Diagnosis not present

## 2019-10-12 DIAGNOSIS — D6481 Anemia due to antineoplastic chemotherapy: Secondary | ICD-10-CM

## 2019-10-12 DIAGNOSIS — C7A090 Malignant carcinoid tumor of the bronchus and lung: Secondary | ICD-10-CM | POA: Diagnosis not present

## 2019-10-12 DIAGNOSIS — C7A8 Other malignant neuroendocrine tumors: Secondary | ICD-10-CM

## 2019-10-12 DIAGNOSIS — Z5189 Encounter for other specified aftercare: Secondary | ICD-10-CM

## 2019-10-12 DIAGNOSIS — Z5111 Encounter for antineoplastic chemotherapy: Secondary | ICD-10-CM | POA: Diagnosis not present

## 2019-10-12 DIAGNOSIS — E119 Type 2 diabetes mellitus without complications: Secondary | ICD-10-CM | POA: Diagnosis not present

## 2019-10-12 DIAGNOSIS — Z7982 Long term (current) use of aspirin: Secondary | ICD-10-CM | POA: Diagnosis not present

## 2019-10-12 DIAGNOSIS — E785 Hyperlipidemia, unspecified: Secondary | ICD-10-CM | POA: Diagnosis not present

## 2019-10-12 LAB — CBC WITH DIFFERENTIAL/PLATELET
Abs Immature Granulocytes: 0.1 10*3/uL — ABNORMAL HIGH (ref 0.00–0.07)
Band Neutrophils: 8 %
Basophils Absolute: 0 10*3/uL (ref 0.0–0.1)
Basophils Relative: 1 %
Eosinophils Absolute: 0.2 10*3/uL (ref 0.0–0.5)
Eosinophils Relative: 7 %
HCT: 23.3 % — ABNORMAL LOW (ref 39.0–52.0)
Hemoglobin: 7.5 g/dL — ABNORMAL LOW (ref 13.0–17.0)
Lymphocytes Relative: 38 %
Lymphs Abs: 1 10*3/uL (ref 0.7–4.0)
MCH: 30.5 pg (ref 26.0–34.0)
MCHC: 32.2 g/dL (ref 30.0–36.0)
MCV: 94.7 fL (ref 80.0–100.0)
Metamyelocytes Relative: 1 %
Monocytes Absolute: 0.7 10*3/uL (ref 0.1–1.0)
Monocytes Relative: 28 %
Myelocytes: 1 %
Neutro Abs: 0.6 10*3/uL — ABNORMAL LOW (ref 1.7–7.7)
Neutrophils Relative %: 16 %
Platelets: 70 10*3/uL — ABNORMAL LOW (ref 150–400)
RBC: 2.46 MIL/uL — ABNORMAL LOW (ref 4.22–5.81)
RDW: 17.4 % — ABNORMAL HIGH (ref 11.5–15.5)
Smear Review: DECREASED
WBC: 2.5 10*3/uL — ABNORMAL LOW (ref 4.0–10.5)
nRBC: 4 % — ABNORMAL HIGH (ref 0.0–0.2)

## 2019-10-12 LAB — COMPREHENSIVE METABOLIC PANEL
ALT: 15 U/L (ref 0–44)
AST: 15 U/L (ref 15–41)
Albumin: 2.9 g/dL — ABNORMAL LOW (ref 3.5–5.0)
Alkaline Phosphatase: 80 U/L (ref 38–126)
Anion gap: 11 (ref 5–15)
BUN: 14 mg/dL (ref 8–23)
CO2: 26 mmol/L (ref 22–32)
Calcium: 9.4 mg/dL (ref 8.9–10.3)
Chloride: 98 mmol/L (ref 98–111)
Creatinine, Ser: 1.16 mg/dL (ref 0.61–1.24)
GFR calc Af Amer: >60 mL/min (ref >60)
GFR calc non Af Amer: >60 mL/min (ref >60)
Glucose, Bld: 158 mg/dL — ABNORMAL HIGH (ref 70–99)
Potassium: 4.1 mmol/L (ref 3.5–5.1)
Sodium: 135 mmol/L (ref 135–145)
Total Bilirubin: 0.7 mg/dL (ref 0.3–1.2)
Total Protein: 7.2 g/dL (ref 6.5–8.1)

## 2019-10-12 LAB — SAMPLE TO BLOOD BANK

## 2019-10-12 LAB — PREPARE RBC (CROSSMATCH)

## 2019-10-12 MED ORDER — PREDNISONE 10 MG PO TABS: 10.0000 mg | ORAL_TABLET | ORAL | 0 refills | Status: DC

## 2019-10-12 MED ORDER — TRAMADOL HCL 50 MG PO TABS: 50.0000 mg | ORAL_TABLET | Freq: Four times a day (QID) | ORAL | 0 refills | Status: DC | PRN

## 2019-10-12 MED ORDER — SODIUM CHLORIDE 0.9 % IV SOLN
Freq: Once | INTRAVENOUS | Status: AC
  Filled 2019-10-12: qty 250

## 2019-10-12 MED ORDER — HEPARIN SOD (PORK) LOCK FLUSH 100 UNIT/ML IV SOLN
INTRAVENOUS | Status: AC
  Filled 2019-10-12: qty 5

## 2019-10-12 MED ORDER — HEPARIN SOD (PORK) LOCK FLUSH 100 UNIT/ML IV SOLN
500.0000 [IU] | Freq: Once | INTRAVENOUS | Status: AC
  Administered 2019-10-12: 500 [IU] via INTRAVENOUS
  Filled 2019-10-12: qty 5

## 2019-10-12 NOTE — Progress Notes (Signed)
Patient had not been taking any of his medications. Patient stated that he started to have right mid abdominal pain intermittently for about a month. Patient also stated that he stays SOB and has no appetite since he gets nauseated. Patient had lost weight since his last visit.

## 2019-10-13 ENCOUNTER — Inpatient Hospital Stay: Payer: Medicare Other

## 2019-10-13 ENCOUNTER — Other Ambulatory Visit: Payer: Self-pay

## 2019-10-13 DIAGNOSIS — I2510 Atherosclerotic heart disease of native coronary artery without angina pectoris: Secondary | ICD-10-CM | POA: Diagnosis not present

## 2019-10-13 DIAGNOSIS — G4730 Sleep apnea, unspecified: Secondary | ICD-10-CM | POA: Diagnosis not present

## 2019-10-13 DIAGNOSIS — D6959 Other secondary thrombocytopenia: Secondary | ICD-10-CM | POA: Diagnosis not present

## 2019-10-13 DIAGNOSIS — R0789 Other chest pain: Secondary | ICD-10-CM | POA: Diagnosis not present

## 2019-10-13 DIAGNOSIS — Z7984 Long term (current) use of oral hypoglycemic drugs: Secondary | ICD-10-CM | POA: Diagnosis not present

## 2019-10-13 DIAGNOSIS — E785 Hyperlipidemia, unspecified: Secondary | ICD-10-CM | POA: Diagnosis not present

## 2019-10-13 DIAGNOSIS — Z7982 Long term (current) use of aspirin: Secondary | ICD-10-CM | POA: Diagnosis not present

## 2019-10-13 DIAGNOSIS — Z85820 Personal history of malignant melanoma of skin: Secondary | ICD-10-CM | POA: Diagnosis not present

## 2019-10-13 DIAGNOSIS — T451X5A Adverse effect of antineoplastic and immunosuppressive drugs, initial encounter: Secondary | ICD-10-CM | POA: Diagnosis not present

## 2019-10-13 DIAGNOSIS — Z85819 Personal history of malignant neoplasm of unspecified site of lip, oral cavity, and pharynx: Secondary | ICD-10-CM | POA: Diagnosis not present

## 2019-10-13 DIAGNOSIS — I10 Essential (primary) hypertension: Secondary | ICD-10-CM | POA: Diagnosis not present

## 2019-10-13 DIAGNOSIS — R062 Wheezing: Secondary | ICD-10-CM | POA: Diagnosis not present

## 2019-10-13 DIAGNOSIS — Z79899 Other long term (current) drug therapy: Secondary | ICD-10-CM | POA: Diagnosis not present

## 2019-10-13 DIAGNOSIS — D649 Anemia, unspecified: Secondary | ICD-10-CM

## 2019-10-13 DIAGNOSIS — J449 Chronic obstructive pulmonary disease, unspecified: Secondary | ICD-10-CM | POA: Diagnosis not present

## 2019-10-13 DIAGNOSIS — E119 Type 2 diabetes mellitus without complications: Secondary | ICD-10-CM | POA: Diagnosis not present

## 2019-10-13 DIAGNOSIS — Z5111 Encounter for antineoplastic chemotherapy: Secondary | ICD-10-CM | POA: Diagnosis not present

## 2019-10-13 DIAGNOSIS — D6481 Anemia due to antineoplastic chemotherapy: Secondary | ICD-10-CM | POA: Diagnosis not present

## 2019-10-13 DIAGNOSIS — R5381 Other malaise: Secondary | ICD-10-CM | POA: Diagnosis not present

## 2019-10-13 DIAGNOSIS — C7A090 Malignant carcinoid tumor of the bronchus and lung: Secondary | ICD-10-CM | POA: Diagnosis not present

## 2019-10-13 DIAGNOSIS — R5383 Other fatigue: Secondary | ICD-10-CM | POA: Diagnosis not present

## 2019-10-13 MED ORDER — ACETAMINOPHEN 325 MG PO TABS
650.0000 mg | ORAL_TABLET | Freq: Once | ORAL | Status: AC
  Administered 2019-10-13: 09:00:00 650 mg via ORAL
  Filled 2019-10-13: qty 2

## 2019-10-13 MED ORDER — SODIUM CHLORIDE 0.9% IV SOLUTION
250.0000 mL | Freq: Once | INTRAVENOUS | Status: AC
  Administered 2019-10-13: 09:00:00 250 mL via INTRAVENOUS
  Filled 2019-10-13: qty 250

## 2019-10-13 MED ORDER — HEPARIN SOD (PORK) LOCK FLUSH 100 UNIT/ML IV SOLN
INTRAVENOUS | Status: AC
  Filled 2019-10-13: qty 5

## 2019-10-13 MED ORDER — HEPARIN SOD (PORK) LOCK FLUSH 100 UNIT/ML IV SOLN
500.0000 [IU] | Freq: Once | INTRAVENOUS | Status: AC
  Administered 2019-10-13: 11:00:00 500 [IU] via INTRAVENOUS
  Filled 2019-10-13: qty 5

## 2019-10-14 ENCOUNTER — Telehealth: Payer: Self-pay | Admitting: *Deleted

## 2019-10-14 ENCOUNTER — Other Ambulatory Visit: Payer: Self-pay

## 2019-10-14 ENCOUNTER — Ambulatory Visit
Admission: RE | Admit: 2019-10-14 | Discharge: 2019-10-14 | Disposition: A | Discharge status: Home / Self Care | Payer: Medicare Other | Source: Ambulatory Visit | Attending: Oncology | Admitting: Oncology

## 2019-10-14 ENCOUNTER — Inpatient Hospital Stay: Payer: Medicare Other

## 2019-10-14 ENCOUNTER — Inpatient Hospital Stay: Payer: Medicare Other | Admitting: Oncology

## 2019-10-14 DIAGNOSIS — C3411 Malignant neoplasm of upper lobe, right bronchus or lung: Secondary | ICD-10-CM

## 2019-10-14 DIAGNOSIS — N200 Calculus of kidney: Secondary | ICD-10-CM | POA: Diagnosis not present

## 2019-10-14 LAB — TYPE AND SCREEN
ABO/RH(D): O NEG
Antibody Screen: NEGATIVE
Unit division: 0

## 2019-10-14 LAB — BPAM RBC
Blood Product Expiration Date: 202101052359
ISSUE DATE / TIME: 202012300931
Unit Type and Rh: 9500

## 2019-10-14 MED ORDER — IOHEXOL 300 MG/ML  SOLN
100.0000 mL | Freq: Once | INTRAMUSCULAR | Status: AC | PRN
  Administered 2019-10-14: 14:00:00 100 mL via INTRAVENOUS

## 2019-10-14 MED ORDER — DOXYCYCLINE HYCLATE 100 MG PO TABS: 100.0000 mg | ORAL_TABLET | Freq: Two times a day (BID) | ORAL | 0 refills | Status: DC

## 2019-10-14 MED ORDER — LEVOFLOXACIN 500 MG PO TABS: 500.0000 mg | ORAL_TABLET | Freq: Every day | ORAL | 0 refills | Status: DC

## 2019-10-14 NOTE — Telephone Encounter (Signed)
Call from pharmacy about drug interaction with Prednisone and Levaquin. Per VERBAL ORDER Dr Janese Banks change patient to Doxycycline 100 mg twice a day times 10 days. Pharmacist informed , new order entered

## 2019-10-14 NOTE — Telephone Encounter (Signed)
Called report  IMPRESSION: CT CHEST IMPRESSION  1. Since the outside chest CT of 05/28/2019, decrease in size of a right upper lobe pulmonary nodule. 2. No thoracic adenopathy. 3. Significantly worsened right-sided aeration, with peribronchovascular nodularity and nodular consolidation. Considerations include acute on chronic atypical infection. Aspiration felt less likely, given lack of significant left-sided abnormality. 4. Aortic atherosclerosis (ICD10-I70.0) and emphysema (ICD10-J43.9). 5. Left Port-A-Cath tip at left brachiocephalic vein.

## 2019-10-14 NOTE — Telephone Encounter (Signed)
Call from Dr Janese Banks asking that patient get Levaquin 500 mg daily times 10 days and that he be set up for COVID testing ASAP. Call to daughter , advised of prescription sent and to please call to to schedule him for COVID test.[phone]  She repeated back to me and agrees to get this done

## 2019-10-14 NOTE — Progress Notes (Signed)
covid19 testing

## 2019-10-17 NOTE — Progress Notes (Signed)
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 As a part of his staging work-up he underwent CT chest as well as CT neck. 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 CT neck was unremarkable however CT chest showed a right upper lobe nodule 2.1 x 1.7 cm in with minimal spiculation as well as a 0.5 cm right lower lobe nodule. No mediastinal or hilar adenopathy was noted. Patient was referred to [facility] for further work-up given his preference. He underwent a PET CT scan on 06/10/2019 which showed a hypermetabolic right upper lobe lung nodule measuring 2 cm. Equivocal right infrahilar hypermetabolism with an SUV of 3.1. 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 Large right kidney mass measuring 9.7 x 9 cm. Patient underwent CT-guided lung biopsy which was consistent with large cell neuroendocrine carcinoma. Patient completed concurrent chemoradiation with carboplatin and etoposide on 09/28/2019.  Interval history-patient is here with significant other today.  Patient reports having significant fatigue and has not been able to do much around the house.  He mostly spends his time resting.also reports right chest wall pain  ECOG PS- 2 Pain scale- 4 Opioid associated constipation- no  Review of  systems- Review of Systems  Constitutional: Positive for malaise/fatigue. Negative for chills, fever and weight loss.       Poor appetite  HENT: Negative for congestion, ear discharge and nosebleeds.   Eyes: Negative for blurred vision.  Respiratory: Negative for cough, hemoptysis, sputum production, shortness of breath and wheezing.        Right chest wall pain  Cardiovascular: Negative for chest pain, palpitations, orthopnea and claudication.  Gastrointestinal: Negative for abdominal pain, blood in stool, constipation, diarrhea, heartburn, melena, nausea and vomiting.  Genitourinary: Negative for dysuria, flank pain, frequency, hematuria and urgency.  Musculoskeletal: Negative for back pain, joint pain and myalgias.  Skin: Negative for rash.  Neurological: Negative for dizziness, tingling, focal weakness, seizures, weakness and headaches.  Endo/Heme/Allergies: Does not bruise/bleed easily.  Psychiatric/Behavioral: Negative for depression and suicidal ideas. The patient does not have insomnia.      Allergies  Allergen Reactions  . Codeine Nausea And Vomiting and Other (See Comments)    Pt states when he takes codeine, he throws up blood  . Niacin And Related Other (See Comments)    "made his body feel hot & he had a hard time breathing"     Past Medical History:  Diagnosis Date  . Anxiety   . COPD (chronic obstructive pulmonary disease) ([place])   . Coronary artery disease 2006   Acute coronary syndrome in March 2006.   . Diabetes mellitus without complication ([place])   . Dyslipidemia   . Hypertension 10/09/2010   EF 40-45%  . Lung cancer ([place])   . Melanoma in situ of ear, right (  [place]) 09/2018  . Sleep apnea    uses cpap machine     Past Surgical History:  Procedure Laterality Date  . Arterial evalation      no evidence of ICA stenosis  . CARDIAC CATHETERIZATION    . CARDIAC SURGERY    . CORONARY ARTERY BYPASS GRAFT  2006   Post CABG surgery with a LIMA to the LAD, a vein  to the diagonal ,,a vein to the the obtuse marginal and vein to the PDA.  [name] CORONARY STENT PLACEMENT  2010  . EAR CYST EXCISION Right 10/05/2018   Procedure: Right Partial pinnectomy;  Surgeon: Izora Gala, MD;  Location: [place];  Service: ENT;  Laterality: Right;  . GRAFT(S) ANGIOGRAM  10/21/2011   Procedure: GRAFT(S) Cyril Loosen;  Surgeon: Leonie Man, MD;  Location: [facility] CATH LAB;  Service: Cardiovascular;;  . LEFT HEART CATHETERIZATION WITH CORONARY ANGIOGRAM N/A 10/21/2011   Procedure: LEFT HEART CATHETERIZATION WITH CORONARY ANGIOGRAM;  Surgeon: Leonie Man, MD;  Location: [facility] CATH LAB;  Service: Cardiovascular;  Laterality: N/A;  . LYMPH NODE BIOPSY Right 10/05/2018   Procedure: sentinel NODE BIOPSY with right partial modified neck dissection;  Surgeon: Izora Gala, MD;  Location: [place];  Service: ENT;  Laterality: Right;  . PORTA CATH INSERTION N/A 07/12/2019   Procedure: PORTA CATH INSERTION;  Surgeon: Algernon Huxley, MD;  Location: [place] CV LAB;  Service: Cardiovascular;  Laterality: N/A;    Social History   Socioeconomic History  . Marital status: Married    Spouse name: JoAnne Andreason  . Number of children: Not on file  . Years of education: Not on file  . Highest education level: Not on file  Occupational History  . Not on file  Tobacco Use  . Smoking status: Current Every Day Smoker    Packs/day: 2.00    Years: 50.00    Pack years: 100.00    Types: Cigarettes  . Smokeless tobacco: Former Systems developer    Types: [place] date: 01/23/2013  Substance and Sexual Activity  . Alcohol use: No  . Drug use: Not Currently  . Sexual activity: Not Currently  Other Topics Concern  . Not on file  Social History Narrative   Lives with son and grandson   Social Determinants of Health   Financial Resource Strain:   . Difficulty of Paying Living Expenses: Not on file  Food Insecurity:   . Worried About Charity fundraiser in the Last Year: Not on file  . Ran Out of Food in  the Last Year: Not on file  Transportation Needs:   . Lack of Transportation (Medical): Not on file  . Lack of Transportation (Non-Medical): Not on file  Physical Activity:   . Days of Exercise per Week: Not on file  . Minutes of Exercise per Session: Not on file  Stress:   . Feeling of Stress : Not on file  Social Connections:   . Frequency of Communication with Friends and Family: Not on file  . Frequency of Social Gatherings with Friends and Family: Not on file  . Attends Religious Services: Not on file  . Active Member of Clubs or Organizations: Not on file  . Attends Archivist Meetings: Not on file  . Marital Status: Not on file  Intimate Partner Violence:   . Fear of Current or Ex-Partner: Not on file  . Emotionally Abused: Not on file  . Physically Abused: Not on file  .  Sexually Abused: Not on file    Family History  Problem Relation Age of Onset  . Hypertension Mother   . Heart attack Mother   . Heart attack Father 73     Current Outpatient Medications:  .  lidocaine-prilocaine (EMLA) cream, Apply to affected area once, Disp: 30 g, Rfl: 3 .  magic mouthwash w/lidocaine SOLN, Take 5 mLs by mouth 4 (four) times daily as needed for mouth pain., Disp: 460 mL, Rfl: 0 .  ALPRAZolam (XANAX) 0.25 MG tablet, Take 0.25 mg by mouth 3 (three) times daily. , Disp: , Rfl:  .  amLODipine (NORVASC) 10 MG tablet, Take 10 mg by mouth daily., Disp: , Rfl:  .  aspirin EC 81 MG tablet, Take 81 mg by mouth daily., Disp: , Rfl:  .  atorvastatin (LIPITOR) 40 MG tablet, TAKE 1 TABLET (40 MG TOTAL) BY MOUTH AT BEDTIME. KEEP OCTOBER APPOINTMENT (Patient not taking: Reported on 10/12/2019), Disp: 90 tablet, Rfl: 0 .  Baclofen 5 MG TABS, Take 5 mg by mouth 3 (three) times daily. If 5 mg tid is not working then can increase to 10 mg tid (Patient not taking: Reported on 10/12/2019), Disp: 90 tablet, Rfl: 0 .  busPIRone (BUSPAR) 7.5 MG tablet, Take 1 tablet by mouth 2 (two) times daily.,  Disp: , Rfl:  .  dexamethasone (DECADRON) 4 MG tablet, Take 2 tablets (8 mg total) by mouth daily. Start the day after chemotherapy for 1 day. (Patient not taking: Reported on 10/12/2019), Disp: 30 tablet, Rfl: 1 .  doxycycline (VIBRA-TABS) 100 MG tablet, Take 1 tablet (100 mg total) by mouth 2 (two) times daily., Disp: 20 tablet, Rfl: 0 .  ezetimibe (ZETIA) 10 MG tablet, TAKE 1 TABLET BY MOUTH EVERY DAY (Patient not taking: Reported on 10/12/2019), Disp: 90 tablet, Rfl: 0 .  famotidine (PEPCID) 20 MG tablet, TAKE 1 TABLET (20 MG TOTAL) BY MOUTH DAILY. NEEDS APPOINTMENT FOR FUTURE REFILLS (Patient not taking: Reported on 10/12/2019), Disp: 90 tablet, Rfl: 2 .  INCRUSE ELLIPTA 62.5 MCG/INH AEPB, Inhale 1 puff into the lungs daily. , Disp: , Rfl: 2 .  Ipratropium-Albuterol (COMBIVENT RESPIMAT) 20-100 MCG/ACT AERS respimat, Inhale 1 puff into the lungs every 6 (six) hours., Disp: , Rfl:  .  isosorbide mononitrate (IMDUR) 60 MG 24 hr tablet, TAKE 1 TABLET BY MOUTH EVERY DAY (Patient not taking: Reported on 09/28/2019), Disp: 90 tablet, Rfl: 1 .  LORazepam (ATIVAN) 0.5 MG tablet, Take 1 tablet 30 minutes prior to each scan (Patient not taking: Reported on 10/12/2019), Disp: 2 tablet, Rfl: 0 .  losartan-hydrochlorothiazide (HYZAAR) 100-12.5 MG tablet, Take 1 tablet by mouth daily. (Patient not taking: Reported on 09/28/2019), Disp: 30 tablet, Rfl: 10 .  metFORMIN (GLUCOPHAGE) 500 MG tablet, TAKE 1 TABLET (500 MG TOTAL) BY MOUTH 2 (TWO) TIMES DAILY WITH A MEAL. (Patient not taking: Reported on 10/12/2019), Disp: 180 tablet, Rfl: 0 .  nitroGLYCERIN (NITROSTAT) 0.4 MG SL tablet, Place 0.4 mg under the tongue every 5 (five) minutes as needed for chest pain., Disp: , Rfl:  .  nystatin (MYCOSTATIN) 100000 UNIT/ML suspension, Take 5 mLs (500,000 Units total) by mouth 4 (four) times daily. (Patient not taking: Reported on 10/12/2019), Disp: 473 mL, Rfl: 0 .  ondansetron (ZOFRAN) 8 MG tablet, Take 1 tablet (8 mg  total) by mouth 2 (two) times daily as needed for refractory nausea / vomiting. Start on day 3 after carboplatin chemo. (Patient not taking: Reported on 10/12/2019), Disp: 30 tablet, Rfl: 1 .  predniSONE (DELTASONE) 10 MG tablet, Take 1 tablet (10 mg total) by mouth as directed. Take 5 pills daily x 3 days, then 4 pills daily x 3 days, then 3 pills daily x 3 days, then 2 pills daily x 3 days, then 1 pill daily x 3 days. Always take med with food, Disp: 45 tablet, Rfl: 0 .  prochlorperazine (COMPAZINE) 10 MG tablet, Take 1 tablet (10 mg total) by mouth every 6 (six) hours as needed (Nausea or vomiting). (Patient not taking: Reported on 10/12/2019), Disp: 30 tablet, Rfl: 1 .  ranolazine (RANEXA) 1000 MG SR tablet, TAKE 1 TABLET BY MOUTH TWICE A DAY (Patient not taking: Reported on 10/12/2019), Disp: 60 tablet, Rfl: 11 .  traMADol (ULTRAM) 50 MG tablet, Take 1 tablet (50 mg total) by mouth every 6 (six) hours as needed., Disp: 120 tablet, Rfl: 0 No current facility-administered medications for this visit.  Facility-Administered Medications Ordered in Other Visits:  .  heparin lock flush 100 unit/mL, 500 Units, Intravenous, Once, Sindy Guadeloupe, MD .  sodium chloride flush (NS) 0.9 % injection 10 mL, 10 mL, Intravenous, PRN, Sindy Guadeloupe, MD .  sodium chloride flush (NS) 0.9 % injection 10 mL, 10 mL, Intravenous, PRN, Sindy Guadeloupe, MD, 10 mL at 08/31/19 1123  Physical exam:  Vitals:   10/12/19 1330  BP: 112/72  Pulse: (!) 102  Temp: (!) 96.4 F (35.8 C)  TempSrc: Tympanic  SpO2: 96%  Weight: 168 lb (76.2 kg)   Physical Exam Constitutional:      Comments: Appears fatigued  HENT:     Head: Normocephalic and atraumatic.  Eyes:     Pupils: Pupils are equal, round, and reactive to light.  Cardiovascular:     Rate and Rhythm: Regular rhythm. Tachycardia present.     Heart sounds: Normal heart sounds.  Pulmonary:     Effort: Pulmonary effort is normal.     Comments: Breath sounds  decreased over bases Abdominal:     General: Bowel sounds are normal.     Palpations: Abdomen is soft.  Musculoskeletal:     Cervical back: Normal range of motion.  Skin:    General: Skin is warm and dry.  Neurological:     Mental Status: He is alert and oriented to person, place, and time.      CMP Latest Ref Rng & Units 10/12/2019  Glucose 70 - 99 mg/dL 158(H)  BUN 8 - 23 mg/dL 14  Creatinine 0.61 - 1.24 mg/dL 1.16  Sodium 135 - 145 mmol/L 135  Potassium 3.5 - 5.1 mmol/L 4.1  Chloride 98 - 111 mmol/L 98  CO2 22 - 32 mmol/L 26  Calcium 8.9 - 10.3 mg/dL 9.4  Total Protein 6.5 - 8.1 g/dL 7.2  Total Bilirubin 0.3 - 1.2 mg/dL 0.7  Alkaline Phos 38 - 126 U/L 80  AST 15 - 41 U/L 15  ALT 0 - 44 U/L 15   CBC Latest Ref Rng & Units 10/12/2019  WBC 4.0 - 10.5 K/uL 2.5(L)  Hemoglobin 13.0 - 17.0 g/dL 7.5(L)  Hematocrit 39.0 - 52.0 % 23.3(L)  Platelets 150 - 400 K/uL 70(L)    No images are attached to the encounter.  CT Chest W Contrast  Result Date: 10/14/2019 CLINICAL DATA:  Malignant neoplasm of right upper lobe. Melanoma. Increased shortness of breath. Weight loss. EXAM: CT CHEST, ABDOMEN, AND PELVIS WITH CONTRAST TECHNIQUE: Multidetector CT imaging of the chest, abdomen and pelvis was performed following the standard protocol during  bolus administration of intravenous contrast. CONTRAST:  131mL OMNIPAQUE IOHEXOL 300 MG/ML  SOLN COMPARISON:  Abdominal MRI of 07/28/2019. Outside chest CT of 05/28/2019, report not available. FINDINGS: CT CHEST FINDINGS Cardiovascular: Left Port-A-Cath tip at left brachiocephalic vein. Advanced aortic and branch vessel atherosclerosis. Median sternotomy for CABG. Normal heart size with left ventricular apical infarct. No pericardial effusion. No central pulmonary embolism, on this non-dedicated study. Mediastinum/Nodes: No mediastinal or hilar adenopathy. Lungs/Pleura: Trace right pleural fluid. Lower lobe predominant bronchial wall thickening. Moderate  centrilobular emphysema. Dominant right upper lobe pulmonary nodule measures 1.5 x 1.5 cm on 57/4 versus 1.9 x 1.7 cm on 05/28/2019 (when remeasured). Pleural-based airspace disease within the immediately caudal right upper lobe including on 67/4, could be radiation induced. Left lower lobe scarring. Significantly worsened right-sided aeration. Right lower and less so right upper lobe progressive peribronchovascular nodularity and nodular airspace disease. Musculoskeletal: No acute osseous abnormality. CT ABDOMEN PELVIS FINDINGS Hepatobiliary: Normal liver. Small gallstones without acute cholecystitis or biliary duct dilatation. Pancreas: Normal, without mass or ductal dilatation. Spleen: Normal in size, without focal abnormality. Adrenals/Urinary Tract: Left adrenal nodule, including at 2.9 x 2.5 cm, similar and consistent with an adenoma on prior MRI. Mild right adrenal nodularity is unchanged. Punctate bilateral renal collecting system calculi. Bilateral renal cysts. Lower pole right renal heterogeneously enhancing mass, consistent with renal cell carcinoma. On the order of 9.2 x 9.2 cm, including on 24/7. Similar to 07/28/2019. No hydronephrosis. Normal urinary bladder. Stomach/Bowel: Normal stomach, without wall thickening. Colonic stool burden suggests constipation. Normal terminal ileum and appendix. Normal small bowel. Vascular/Lymphatic: Advanced abdominal aortic atherosclerosis. Focal outpouching at the 4 o'clock position of the juxtarenal aorta measures 9 mm on 65/2 and is relatively similar. Patent renal veins. No abdominopelvic adenopathy. Reproductive: Normal prostate. Other: Small bilateral fat containing inguinal hernias. Musculoskeletal: No acute osseous abnormality. IMPRESSION: CT CHEST IMPRESSION 1. Since the outside chest CT of 05/28/2019, decrease in size of a right upper lobe pulmonary nodule. 2. No thoracic adenopathy. 3. Significantly worsened right-sided aeration, with peribronchovascular  nodularity and nodular consolidation. Considerations include acute on chronic atypical infection. Aspiration felt less likely, given lack of significant left-sided abnormality. 4. Aortic atherosclerosis (ICD10-I70.0) and emphysema (ICD10-J43.9). 5. Left Port-A-Cath tip at left brachiocephalic vein. CT ABDOMEN AND PELVIS IMPRESSION 1. Since 07/28/2019 MRI, similar right right-sided renal cell carcinoma. 2. Similar left adrenal nodule, consistent with an adenoma on prior MRI. No findings of metastatic disease within the abdomen or pelvis. 3. Cholelithiasis. 4. Bilateral nephrolithiasis. 5. Similar focal aortic outpouching which could represent saccular aneurysm or penetrating ulcer. These results will be called to the ordering clinician or representative by the Radiologist Assistant, and communication documented in the PACS or zVision Dashboard. Electronically Signed   By: Abigail Miyamoto M.D.   On: 10/14/2019 15:20   CT Abdomen Pelvis W Contrast  Result Date: 10/14/2019 CLINICAL DATA:  Malignant neoplasm of right upper lobe. Melanoma. Increased shortness of breath. Weight loss. EXAM: CT CHEST, ABDOMEN, AND PELVIS WITH CONTRAST TECHNIQUE: Multidetector CT imaging of the chest, abdomen and pelvis was performed following the standard protocol during bolus administration of intravenous contrast. CONTRAST:  158mL OMNIPAQUE IOHEXOL 300 MG/ML  SOLN COMPARISON:  Abdominal MRI of 07/28/2019. Outside chest CT of 05/28/2019, report not available. FINDINGS: CT CHEST FINDINGS Cardiovascular: Left Port-A-Cath tip at left brachiocephalic vein. Advanced aortic and branch vessel atherosclerosis. Median sternotomy for CABG. Normal heart size with left ventricular apical infarct. No pericardial effusion. No central pulmonary embolism, on this  non-dedicated study. Mediastinum/Nodes: No mediastinal or hilar adenopathy. Lungs/Pleura: Trace right pleural fluid. Lower lobe predominant bronchial wall thickening. Moderate centrilobular  emphysema. Dominant right upper lobe pulmonary nodule measures 1.5 x 1.5 cm on 57/4 versus 1.9 x 1.7 cm on 05/28/2019 (when remeasured). Pleural-based airspace disease within the immediately caudal right upper lobe including on 67/4, could be radiation induced. Left lower lobe scarring. Significantly worsened right-sided aeration. Right lower and less so right upper lobe progressive peribronchovascular nodularity and nodular airspace disease. Musculoskeletal: No acute osseous abnormality. CT ABDOMEN PELVIS FINDINGS Hepatobiliary: Normal liver. Small gallstones without acute cholecystitis or biliary duct dilatation. Pancreas: Normal, without mass or ductal dilatation. Spleen: Normal in size, without focal abnormality. Adrenals/Urinary Tract: Left adrenal nodule, including at 2.9 x 2.5 cm, similar and consistent with an adenoma on prior MRI. Mild right adrenal nodularity is unchanged. Punctate bilateral renal collecting system calculi. Bilateral renal cysts. Lower pole right renal heterogeneously enhancing mass, consistent with renal cell carcinoma. On the order of 9.2 x 9.2 cm, including on 24/7. Similar to 07/28/2019. No hydronephrosis. Normal urinary bladder. Stomach/Bowel: Normal stomach, without wall thickening. Colonic stool burden suggests constipation. Normal terminal ileum and appendix. Normal small bowel. Vascular/Lymphatic: Advanced abdominal aortic atherosclerosis. Focal outpouching at the 4 o'clock position of the juxtarenal aorta measures 9 mm on 65/2 and is relatively similar. Patent renal veins. No abdominopelvic adenopathy. Reproductive: Normal prostate. Other: Small bilateral fat containing inguinal hernias. Musculoskeletal: No acute osseous abnormality. IMPRESSION: CT CHEST IMPRESSION 1. Since the outside chest CT of 05/28/2019, decrease in size of a right upper lobe pulmonary nodule. 2. No thoracic adenopathy. 3. Significantly worsened right-sided aeration, with peribronchovascular nodularity and  nodular consolidation. Considerations include acute on chronic atypical infection. Aspiration felt less likely, given lack of significant left-sided abnormality. 4. Aortic atherosclerosis (ICD10-I70.0) and emphysema (ICD10-J43.9). 5. Left Port-A-Cath tip at left brachiocephalic vein. CT ABDOMEN AND PELVIS IMPRESSION 1. Since 07/28/2019 MRI, similar right right-sided renal cell carcinoma. 2. Similar left adrenal nodule, consistent with an adenoma on prior MRI. No findings of metastatic disease within the abdomen or pelvis. 3. Cholelithiasis. 4. Bilateral nephrolithiasis. 5. Similar focal aortic outpouching which could represent saccular aneurysm or penetrating ulcer. These results will be called to the ordering clinician or representative by the Radiologist Assistant, and communication documented in the PACS or zVision Dashboard. Electronically Signed   By: Abigail Miyamoto M.D.   On: 10/14/2019 15:20     Assessment and plan- Patient is a 69 y.o. male with stage I large cell neuroendocrine tumor of the right lung.he is s/p 4 cycles of carboplatin/etoposide chemotherapy. This is an acute visit for ongoign fatigue  1. After completing chemo/RT patient reports some increased cough, fatigue. Denies any fever. He has some sob at baseline but not increased. He has not required any O2. I will give him 10 day course of empiric steroids for possible copd exacerbation.   2. I will also obtain ct chest abdomen pelvis this week  3. 1L of IVF today and 1 unit of PRBC tomorrow  4. I have reviewed his labs done from today. He is pancytopenic from chemotherapy which should improve over next 2-3 weeks. I will transfuse 1unit prbc tomorrow given that his hb is 7.5  5. Right chest wall pain- will renew hydrocodone   Visit Diagnosis 1. Symptomatic anemia   2. Malignant neoplasm of upper lobe of right lung ([place])   3. Anemia due to antineoplastic chemotherapy   4. Neoplasm related pain  Dr. Randa Evens, MD,  MPH [facility] at [facility] [phone] 10/17/2019 8:33 AM     ADDENDUM: I reviewed ct scan results which showed worsening right lower lobe opacity. I have started him on doxycycline given potential concern for tendon rupture with steroids. I did update Dr. Bea Laura as well  Dr. Randa Evens, MD, MPH [facility] at [facility]  [phone] 10/17/2019 8:41 AM

## 2019-10-18 ENCOUNTER — Other Ambulatory Visit: Payer: Self-pay

## 2019-10-18 ENCOUNTER — Ambulatory Visit: Payer: Medicare Other | Attending: Internal Medicine

## 2019-10-18 DIAGNOSIS — Z20822 Contact with and (suspected) exposure to covid-19: Secondary | ICD-10-CM | POA: Diagnosis not present

## 2019-10-18 NOTE — Progress Notes (Unsigned)
Patient pre screened for appointment. Daughter supplied information for chart. Daughter stated that he has been tested for covid results pending. secondary to CT scan results. He has also being treated with ABT.

## 2019-10-19 ENCOUNTER — Inpatient Hospital Stay: Payer: Medicare Other | Admitting: Oncology

## 2019-10-19 ENCOUNTER — Inpatient Hospital Stay: Payer: Medicare Other

## 2019-10-19 ENCOUNTER — Ambulatory Visit: Payer: Medicare Other | Admitting: Urology

## 2019-10-19 ENCOUNTER — Other Ambulatory Visit: Payer: Self-pay | Admitting: Cardiovascular Disease

## 2019-10-19 LAB — NOVEL CORONAVIRUS, NAA: SARS-CoV-2, NAA: NOT DETECTED

## 2019-10-20 ENCOUNTER — Ambulatory Visit: Payer: Medicare Other | Admitting: Radiation Oncology

## 2019-10-22 ENCOUNTER — Inpatient Hospital Stay: Payer: Medicare Other

## 2019-10-22 ENCOUNTER — Inpatient Hospital Stay: Payer: Medicare Other | Admitting: Oncology

## 2019-10-25 ENCOUNTER — Other Ambulatory Visit: Payer: Self-pay

## 2019-10-25 ENCOUNTER — Encounter: Payer: Self-pay | Admitting: Radiation Oncology

## 2019-10-25 ENCOUNTER — Ambulatory Visit
Admission: RE | Admit: 2019-10-25 | Discharge: 2019-10-25 | Disposition: A | Discharge status: Home / Self Care | Payer: Medicare Other | Source: Ambulatory Visit | Attending: Radiation Oncology | Admitting: Radiation Oncology

## 2019-10-25 VITALS — BP 149/82 | HR 109 | Temp 96.5°F | Resp 22 | Wt 174.5 lb

## 2019-10-25 DIAGNOSIS — Z923 Personal history of irradiation: Secondary | ICD-10-CM | POA: Diagnosis not present

## 2019-10-25 DIAGNOSIS — C7A1 Malignant poorly differentiated neuroendocrine tumors: Secondary | ICD-10-CM | POA: Insufficient documentation

## 2019-10-25 DIAGNOSIS — C7A8 Other malignant neuroendocrine tumors: Secondary | ICD-10-CM

## 2019-10-25 NOTE — Progress Notes (Signed)
Radiation Oncology Follow up Note  Name: Troy [name]   Date:   10/25/2019 MRN:  [id number] DOB: 16-Sep-1951    This 69 y.o. 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  REFERRING PROVIDER: [name], MD  HPI: Patient is a 69 year old male now at 1 month having completed IMRT radiation therapy to his right upper lobe for stage IIa large cell neuroendocrine carcinoma seen today in routine follow-up he is doing well he has had no decrease in his pulmonary functions.  He specifically denies cough hemoptysis or chest tightness.[name]  He did have a recent CT scan showing 6 significantly worse right-sided aeration although there is a decrease in size of the right upper lobe pulmonary nodule.  Abdominal CT scan shows a left-sided adrenal nodule probably adenoma and right-sided renal cell carcinoma stable since October 2020.  COMPLICATIONS OF TREATMENT: none  FOLLOW UP COMPLIANCE: keeps appointments   PHYSICAL EXAM:  BP (!) 149/82 (BP Location: Left Arm, Patient Position: Sitting)   Pulse (!) 109   Temp (!) 96.5 F (35.8 C) (Tympanic)   Resp (!) 22   Wt 174 lb 8 oz (79.2 kg)   BMI 26.53 kg/m  Well-developed well-nourished patient in NAD. HEENT reveals PERLA, EOMI, discs not visualized.  Oral cavity is clear. No oral mucosal lesions are identified. Neck is clear without evidence of cervical or supraclavicular adenopathy. Lungs are clear to A&P. Cardiac examination is essentially unremarkable with regular rate and rhythm without murmur rub or thrill. Abdomen is benign with no organomegaly or masses noted. Motor sensory and DTR levels are equal and symmetric in the upper and lower extremities. Cranial nerves II through XII are grossly intact. Proprioception is intact. No peripheral adenopathy or edema is identified. No motor or sensory levels are noted. Crude visual  fields are within normal range.  RADIOLOGY RESULTS: CT scans reviewed  PLAN: Present time patient is doing well [place] early for CT scan for evaluation of his chest I will repeat that in 4 months and see him at that time and follow-up.  He is scheduled to see Dr. [name] for possible nephrectomy for his renal cell carcinoma were making sure he take goes to that appointment.  He also will see Dr. Janese Banks in follow-up.  Otherwise am pleased with his overall progress.  Follow-up plans were made.  Patient knows to call with any concerns.  I would like to take this opportunity to thank you for allowing me to participate in the care of your patient.Noreene Filbert, MD

## 2019-10-26 ENCOUNTER — Encounter: Payer: Self-pay | Admitting: Urology

## 2019-10-26 ENCOUNTER — Ambulatory Visit (INDEPENDENT_AMBULATORY_CARE_PROVIDER_SITE_OTHER): Payer: Medicare Other | Admitting: Urology

## 2019-10-26 ENCOUNTER — Telehealth: Payer: Self-pay | Admitting: *Deleted

## 2019-10-26 ENCOUNTER — Other Ambulatory Visit: Payer: Self-pay | Admitting: Radiology

## 2019-10-26 VITALS — BP 162/88 | HR 66 | Ht 68.0 in | Wt 175.0 lb

## 2019-10-26 DIAGNOSIS — C641 Malignant neoplasm of right kidney, except renal pelvis: Secondary | ICD-10-CM | POA: Diagnosis not present

## 2019-10-26 DIAGNOSIS — N2889 Other specified disorders of kidney and ureter: Secondary | ICD-10-CM | POA: Diagnosis not present

## 2019-10-26 NOTE — Telephone Encounter (Signed)
   [facility] Pre-operative Risk Assessment    Request for surgical clearance:  1. What type of surgery is being performed?  RIGHT NEPHRECTOMY   CX RIGHT CLEAR CELL RENAL CELL CARCINOMA  2. When is this surgery scheduled? 12/13/2019  3. What type of clearance is required (medical clearance vs. Pharmacy clearance to hold med vs. Both)? MEDICAL  4. Are there any medications that need to be held prior to surgery and how long? ASPIRIN 81 MG -- 7 DAYS PRIOR TO SURGERY  5. Practice name and name of physician performing surgery? [facility]; DR Hollice Espy  6. What is your office phone number [phone]    7.   What is your office fax number [phone]  8.   Anesthesia type (None, local, MAC, general) ?  Troy [name] 10/26/2019, 5:53 PM  _________________________________________________________________   (provider comments below)

## 2019-10-26 NOTE — Progress Notes (Signed)
10/26/2019 3:57 PM   [place] 09/28/1951 [id number]  Referring provider: [name], MD [place],  [place] [id number]  No chief complaint on file.   HPI: 69 year old male with biopsy-proven right renal cell carcinoma who presents today to discuss operative management of his large tumor.  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 Donella Stade Dr. Janese Banks.    He has completed radiation to the chest and has 1 more dose of chemotherapy.  He has tolerated this reasonably well.  As part of his staging work-up for this, he underwent a PET scan which showed an incidental large right renal mass.  Most recently for further staging, he underwent an MRI of the abdomen with and without contrast revealing a 10.9 cm right lower pole renal mass which is a heterogeneously enhancing without pathologic adenopathy or renal vein involvement.    Given his history of multimalignancies, he did undergo biopsy of this which was indicative of renal cell carcinoma, nuclear grade 2.  Most recent staging imaging in the form of CT abdomen pelvis with contrast on 10/14/2019 shows no significant interval growth of his renal mass, lesion now measures 9.2 x 9.2 cm..  Radiation changes of the lungs are appreciated.  He continues to smoke.  He does have extensive cardiac history on aspirin and Plavix.  He will need clearance for this.  No previous abdominal surgeries.   PMH: Past Medical History:  Diagnosis Date  .  Anxiety   . COPD (chronic obstructive pulmonary disease) ([place])   . Coronary artery disease 2006   Acute coronary syndrome in March 2006.   . Diabetes mellitus without complication ([place])   . Dyslipidemia   . Hypertension 10/09/2010   EF 40-45%  . Lung cancer ([place])   . 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 Sleep apnea    uses cpap machine    Surgical History: Past Surgical History:  Procedure Laterality Date  . Arterial evalation      no evidence of ICA stenosis  . CARDIAC CATHETERIZATION    . CARDIAC SURGERY    . CORONARY ARTERY BYPASS GRAFT  2006   Post CABG surgery with a LIMA to the LAD, a vein to the diagonal ,,a vein to the the obtuse marginal and vein to the PDA.  [name] CORONARY STENT PLACEMENT  2010  . EAR CYST EXCISION Right 10/05/2018   Procedure: Right Partial pinnectomy;  Surgeon: Izora Gala, MD;  Location: [facility];  Service: ENT;  Laterality: Right;  . GRAFT(S) ANGIOGRAM  10/21/2011   Procedure: GRAFT(S) Cyril Loosen;  Surgeon: Leonie Man, MD;  Location: [facility] CATH LAB;  Service: Cardiovascular;;  . LEFT HEART CATHETERIZATION WITH CORONARY ANGIOGRAM N/A 10/21/2011   Procedure: LEFT HEART CATHETERIZATION WITH CORONARY ANGIOGRAM;  Surgeon: Leonie Man, MD;  Location: [facility] CATH LAB;  Service: Cardiovascular;  Laterality: N/A;  . LYMPH NODE BIOPSY Right 10/05/2018   Procedure: sentinel NODE BIOPSY with right partial modified neck dissection;  Surgeon: Izora Gala, MD;  Location: [facility]  OR;  Service: ENT;  Laterality: Right;  . PORTA CATH INSERTION N/A 07/12/2019   Procedure: PORTA CATH INSERTION;  Surgeon: Algernon Huxley, MD;  Location: [facility] CV LAB;  Service: Cardiovascular;  Laterality: N/A;    Home Medications:  Allergies as of 10/26/2019      Reactions   Codeine Nausea And Vomiting, Other (See Comments)   Pt states when he takes codeine, he throws up blood   Niacin And Related Other (See Comments)   "made his body feel hot & he had a hard time breathing"       Medication List       Accurate as of October 26, 2019 11:59 PM. If you have any questions, ask your nurse or doctor.        ALPRAZolam 0.25 MG tablet Commonly known as: XANAX Take 0.25 mg by mouth 3 (three) times daily.   amLODipine 10 MG tablet Commonly known as: NORVASC Take 10 mg by mouth daily.   aspirin EC 81 MG tablet Take 81 mg by mouth daily.   atorvastatin 40 MG tablet Commonly known as: LIPITOR TAKE 1 TABLET (40 MG TOTAL) BY MOUTH AT BEDTIME. KEEP OCTOBER APPOINTMENT   Baclofen 5 MG Tabs Take 5 mg by mouth 3 (three) times daily. If 5 mg tid is not working then can increase to 10 mg tid   busPIRone 7.5 MG tablet Commonly known as: BUSPAR Take 1 tablet by mouth 2 (two) times daily.   Combivent Respimat 20-100 MCG/ACT Aers respimat Generic drug: Ipratropium-Albuterol Inhale 1 puff into the lungs every 6 (six) hours.   dexamethasone 4 MG tablet Commonly known as: DECADRON Take 2 tablets (8 mg total) by mouth daily. Start the day after chemotherapy for 1 day.   doxycycline 100 MG tablet Commonly known as: VIBRA-TABS Take 1 tablet (100 mg total) by mouth 2 (two) times daily.   ezetimibe 10 MG tablet Commonly known as: ZETIA TAKE 1 TABLET BY MOUTH EVERY DAY   famotidine 20 MG tablet Commonly known as: PEPCID TAKE 1 TABLET (20 MG TOTAL) BY MOUTH DAILY. NEEDS APPOINTMENT FOR FUTURE REFILLS   Incruse Ellipta 62.5 MCG/INH Aepb Generic drug: umeclidinium bromide Inhale 1 puff into the lungs daily.   isosorbide mononitrate 60 MG 24 hr tablet Commonly known as: IMDUR TAKE 1 TABLET BY MOUTH EVERY DAY   lidocaine-prilocaine cream Commonly known as: EMLA Apply to affected area once   LORazepam 0.5 MG tablet Commonly known as: ATIVAN Take 1 tablet 30 minutes prior to each scan   losartan-hydrochlorothiazide 100-12.5 MG tablet Commonly known as: HYZAAR Take 1 tablet by mouth daily.   magic mouthwash w/lidocaine Soln Take 5 mLs by mouth 4 (four) times  daily as needed for mouth pain.   metFORMIN 500 MG tablet Commonly known as: GLUCOPHAGE TAKE 1 TABLET (500 MG TOTAL) BY MOUTH 2 (TWO) TIMES DAILY WITH A MEAL.   nitroGLYCERIN 0.4 MG SL tablet Commonly known as: NITROSTAT Place 0.4 mg under the tongue every 5 (five) minutes as needed for chest pain.   nystatin 100000 UNIT/ML suspension Commonly known as: MYCOSTATIN Take 5 mLs (500,000 Units total) by mouth 4 (four) times daily.   ondansetron 8 MG tablet Commonly known as: Zofran Take 1 tablet (8 mg total) by mouth 2 (two) times daily as needed for refractory nausea / vomiting. Start on day 3 after carboplatin chemo.   predniSONE 10 MG tablet Commonly known as: DELTASONE Take 1 tablet (10 mg total) by mouth as directed.  Take 5 pills daily x 3 days, then 4 pills daily x 3 days, then 3 pills daily x 3 days, then 2 pills daily x 3 days, then 1 pill daily x 3 days. Always take med with food   prochlorperazine 10 MG tablet Commonly known as: COMPAZINE Take 1 tablet (10 mg total) by mouth every 6 (six) hours as needed (Nausea or vomiting).   ranolazine 1000 MG SR tablet Commonly known as: RANEXA TAKE 1 TABLET BY MOUTH TWICE A DAY   traMADol 50 MG tablet Commonly known as: ULTRAM Take 1 tablet (50 mg total) by mouth every 6 (six) hours as needed.       Allergies:  Allergies  Allergen Reactions  . Codeine Nausea And Vomiting and Other (See Comments)    Pt states when he takes codeine, he throws up blood  . Niacin And Related Other (See Comments)    "made his body feel hot & he had a hard time breathing"    Family History: Family History  Problem Relation Age of Onset  . Hypertension Mother   . Heart attack Mother   . Heart attack Father 60    Social History:  reports that he has been smoking cigarettes. He has a 100.00 pack-year smoking history. He quit smokeless tobacco use about 6 years ago.  His smokeless tobacco use included chew. He reports previous drug use. He  reports that he does not drink alcohol.  ROS: UROLOGY Frequent Urination?: No Hard to postpone urination?: No Burning/pain with urination?: No Get up at night to urinate?: No Leakage of urine?: No Urine stream starts and stops?: No Trouble starting stream?: No Do you have to strain to urinate?: No Blood in urine?: No Urinary tract infection?: No Sexually transmitted disease?: No Injury to kidneys or bladder?: No Painful intercourse?: No Weak stream?: No Erection problems?: No Penile pain?: No  Gastrointestinal Nausea?: No Vomiting?: No Indigestion/heartburn?: No Diarrhea?: No Constipation?: No  Constitutional Fever: No Night sweats?: No Weight loss?: No Fatigue?: No  Skin Skin rash/lesions?: No Itching?: No  Eyes Blurred vision?: No Double vision?: No  Ears/Nose/Throat Sore throat?: No Sinus problems?: No  Hematologic/Lymphatic Swollen glands?: No Easy bruising?: No  Cardiovascular Leg swelling?: No Chest pain?: No  Respiratory Cough?: No Shortness of breath?: No  Endocrine Excessive thirst?: No  Musculoskeletal Back pain?: No Joint pain?: No  Neurological Headaches?: No Dizziness?: No  Psychologic Depression?: No Anxiety?: No  Physical Exam: BP (!) 162/88   Pulse 66   Ht 5\' 8"  (1.727 m)   Wt 175 lb (79.4 kg)   BMI 26.61 kg/m   Constitutional:  Alert and oriented, No acute distress.  Smells of tobacco. HEENT:  AT, moist mucus membranes.  Trachea midline, no masses.  Right ear partially surgically absent. Cardiovascular: No clubbing, cyanosis, or edema. Respiratory: Normal respiratory effort, no increased work of breathing. Abdomen: No abdominal scarring. Skin: No rashes, bruises or suspicious lesions. Neurologic: Grossly intact, no focal deficits, moving all 4 extremities. Psychiatric: Normal mood and affect.  Laboratory Data: Lab Results  Component Value Date   WBC 11.2 (H) 10/29/2019   HGB 9.8 (L) 10/29/2019   HCT 32.9  (L) 10/29/2019   MCV 102.8 (H) 10/29/2019   PLT 240 10/29/2019    Lab Results  Component Value Date   CREATININE 1.16 10/12/2019    Lab Results  Component Value Date   HGBA1C 6.1 (H) 09/29/2018    Urinalysis Results for orders placed or performed in visit on  10/26/19  Microscopic Examination   URINE  Result Value Ref Range   WBC, UA 0-5 0 - 5 /hpf   RBC None seen 0 - 2 /hpf   Epithelial Cells (non renal) 0-10 0 - 10 /hpf   Bacteria, UA None seen None seen/Few  Urinalysis, Complete  Result Value Ref Range   Specific Gravity, UA 1.025 1.005 - 1.030   pH, UA 5.5 5.0 - 7.5   Color, UA Yellow Yellow   Appearance Ur Clear Clear   Leukocytes,UA Negative Negative   Protein,UA Negative Negative/Trace   Glucose, UA Negative Negative   Ketones, UA Negative Negative   RBC, UA Negative Negative   Bilirubin, UA Negative Negative   Urobilinogen, Ur 0.2 0.2 - 1.0 mg/dL   Nitrite, UA Negative Negative   Microscopic Examination See below:     Pertinent Imaging: CT abdomen pelvis with contrast from 10/14/2019 was personally reviewed  Agree with radiologic interpretation.  Mass is very central in location with what appears to be 2 calcified renal arteries and a single vein.  There is also an upper pole simple renal cyst.  No obvious adenopathy.  Assessment & Plan:    1. Renal cell carcinoma of right kidney (HCC) Biopsy-proven renal cell carcinoma, large and central without obvious metastatic disease  As per discussion with Dr. Janese Banks again today, he has now had his squamous cell of the lung adequately treated.  He will be appropriate for consideration of surgical resection of his renal cell carcinoma in about 4 to 6 weeks after he recovers from his last dose of chemotherapy allowing for his blood counts to return to baseline.  As per previous discussion, discussed options including nephrectomy versus observation.  He does have multiple medical comorbidities this observation would be  reasonable especially given the stability, however the lesion is relatively large and central and likely will ultimately metastasized.  Ultimately, he would like to pursue surgical intervention in the form of right radical nephrectomy.  We will plan for robotic approach.  Risk and benefits of surgery were discussed in detail good risk of bleeding, damage surrounding structures, ileus/hernia, discomfort along with other more complex including heart attack, stroke, DVT, PE amongst others.  All questions answered.  Postoperative course was discussed.  He will need cardiac clearance.  - Urinalysis, Complete   Hollice Espy, MD  [place] [address], [place], [place] [address] [phone]  I spent 40 min with this patient of which greater than 50% was spent in counseling and coordination of care with the patient.

## 2019-10-27 ENCOUNTER — Encounter: Payer: Self-pay | Admitting: Cardiovascular Disease

## 2019-10-27 ENCOUNTER — Ambulatory Visit (INDEPENDENT_AMBULATORY_CARE_PROVIDER_SITE_OTHER): Payer: Medicare Other | Admitting: Cardiovascular Disease

## 2019-10-27 ENCOUNTER — Other Ambulatory Visit: Payer: Self-pay

## 2019-10-27 VITALS — BP 134/72 | HR 102 | Temp 98.4°F | Ht 68.0 in | Wt 175.2 lb

## 2019-10-27 DIAGNOSIS — E785 Hyperlipidemia, unspecified: Secondary | ICD-10-CM

## 2019-10-27 DIAGNOSIS — Z01818 Encounter for other preprocedural examination: Secondary | ICD-10-CM

## 2019-10-27 DIAGNOSIS — J439 Emphysema, unspecified: Secondary | ICD-10-CM

## 2019-10-27 DIAGNOSIS — I25709 Atherosclerosis of coronary artery bypass graft(s), unspecified, with unspecified angina pectoris: Secondary | ICD-10-CM | POA: Diagnosis not present

## 2019-10-27 DIAGNOSIS — I10 Essential (primary) hypertension: Secondary | ICD-10-CM

## 2019-10-27 DIAGNOSIS — Z951 Presence of aortocoronary bypass graft: Secondary | ICD-10-CM | POA: Diagnosis not present

## 2019-10-27 DIAGNOSIS — Z720 Tobacco use: Secondary | ICD-10-CM

## 2019-10-27 LAB — URINALYSIS, COMPLETE
Bilirubin, UA: NEGATIVE
Glucose, UA: NEGATIVE
Ketones, UA: NEGATIVE
Leukocytes,UA: NEGATIVE
Nitrite, UA: NEGATIVE
Protein,UA: NEGATIVE
RBC, UA: NEGATIVE
Specific Gravity, UA: 1.025 (ref 1.005–1.030)
Urobilinogen, Ur: 0.2 mg/dL (ref 0.2–1.0)
pH, UA: 5.5 (ref 5.0–7.5)

## 2019-10-27 LAB — MICROSCOPIC EXAMINATION
Bacteria, UA: NONE SEEN
RBC, Urine: NONE SEEN /hpf (ref 0–2)

## 2019-10-27 MED ORDER — BISOPROLOL FUMARATE 5 MG PO TABS: ORAL_TABLET | ORAL | 3 refills | Status: DC

## 2019-10-27 NOTE — Telephone Encounter (Signed)
Patient has an appointment with Dr. [name] scheduled for today at 2:20. Dr. [name], can you address his pre-op risk at your visit today?   Thank you!

## 2019-10-27 NOTE — Patient Instructions (Addendum)
Medication Instructions:  Your physician has recommended you make the following change in your medication:   START BISOPROLOL 5 MG. HALF A TABLET BY MOUTH DAILY FOR ONE WEEK. AFTER THIS, INCREASE TO ONE WHOLE TABLET BY MOUTH DAILY  *If you need a refill on your cardiac medications before your next appointment, please call your pharmacy*  Lab Work: Your physician recommends that you return for lab work: COMPREHENSIVE METABOLIC PANEL    COMPLETE BLOOD COUNT    THYROID STIMULATING HORMONE LIPID PROFILE     If you have labs (blood work) drawn today and your tests are completely normal, you will receive your results only by: [name] MyChart Message (if you have MyChart) OR . A paper copy in the mail If you have any lab test that is abnormal or we need to change your treatment, we will call you to review the results.  Testing/Procedures: Your physician has requested that you have an echocardiogram. Echocardiography is a painless test that uses sound waves to create images of your heart. It provides your doctor with information about the size and shape of your heart and how well your heart's chambers and valves are working. This procedure takes approximately one hour. There are no restrictions for this procedure. LOCATION: [place] [facility]: [place], [place], [place] [address]    Follow-Up: At [facility], you and your health needs are our priority.  As part of our continuing mission to provide you with exceptional heart care, we have created designated Provider Care Teams.  These Care Teams include your primary Cardiologist (physician) and Advanced Practice Providers (APPs -  Physician Assistants and Nurse Practitioners) who all work together to provide you with the care you need, when you need it.  Your next appointment:   4 week(s) (MID-February APPOINTMENT)  The format for your next appointment:   In Person  Provider:   [name],  MD

## 2019-10-27 NOTE — Progress Notes (Signed)
Patient ID: [name], male   DOB: August 25, 1951, 69 y.o.   MRN: [id number]     HPI: Troy [name] is a 69 y.o. male presents to the office for a 21 month cardiology follow up evaluation and preoperative clearance.  [name] suffered an acute coronary syndrome in March 2006 and underwent CABG surgery with LIMA to the LAD, vein to the diagonal, vein to the obtuse marginal, and vein to the PDA. In November 2010 he underwent PCI to the ostium of the proximal  graft to the RCA as well as native diagonal since the graft that supplied the diagonal was found to be occluded. His last catheterization in January 2013 showed a patent stent in the vein graft to the RCA, the  LIMA was small and nearly atretic and did not reach the LAD. Vein graft to diagonal was occluded. The vein graft to the marginal vessel was widely patent. Unfortunately, he continue to have significant tobacco use. He has a history of hyperlipidemia, hypertension, and uses supplemental oxygen at night. At times he does note some episodes of chest pain which have improved with initiation of Ranexa therapy.  A nuclear perfusion study on 06/15/2013 was interpreted as a low risk study demonstrating moderate size distal antero-apical infarction with very mild preinfarction ischemia. He did have antero-apical dyskinesis and septal wall motion consistent with his prior sternotomy. He has felt improved.  He quit smoking for very short duration but unfortunately resumed his long-standing habit.  He is smoking one pack per day.  He admits to shortness of breath with activity.  He denies any episodes of chest tightness.  His medical regimen consists of amlodipine 10 mg, isosorbide 60 mg losartan HCT 100/12.5 Toprol-XL 100 mg Ranexa 1000 mg twice a day for his hypertension as well as CAD.  He continues on dual antiplatelet therapy without bleeding issues.  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   L:aboratory from his  primary M.D. in March 2018 had revealed  hemoglobin A1c at 8.5.  His glucose was 282.  Creatinine 1.32.  Lipid studies showed cholesterol 169 with elevation of triglycerides 181, increased VLDL at 36, and an LDL of 98.  He continues to smoke.  He sees Dr. Elijio [name] in [place] for primary care.    I last saw him in March 2019.  At that time he was not eating as well and had lost weight from 214  pounds down to 190.  He was still smoking and was not exercising.  He denied recurrent anginal symptomatology.  Since I last saw him, he has been diagnosed with multiple cancers.  He had seen Troy [name], Troy [name] in August 2020.  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  He was referred for [facility] study for preoperative assessment on June 16, 2019 which was consistent with his prior infarct in the anterior wall without associated ischemia.  EF estimate was 53%.  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 He has been undergoing several cycles of chemotherapy and radiation treatment resulting in hair loss.  He was also found to have an 11 cm lower pole right renal mass and is scheduled to undergo robotic assisted laparoscopic nephrectomy by Dr. Hollice [name] on December 13, 2018.  He denies  any chest pain.  He is unaware of PND orthopnea.  He has noticed his pulse to be increased.  He presents for preoperative clearance.   Past Medical History:  Diagnosis Date  . Anxiety   . COPD (chronic obstructive pulmonary disease) ([place])   . Coronary artery disease 2006   Acute coronary syndrome in March 2006.   . Diabetes mellitus without complication ([place])   . Dyslipidemia   . Hypertension 10/09/2010   EF 40-45%  . Lung cancer ([place])   . 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 Sleep apnea    uses cpap  machine    Past Surgical History:  Procedure Laterality Date  . Arterial evalation      no evidence of ICA stenosis  . CARDIAC CATHETERIZATION    . CARDIAC SURGERY    . CORONARY ARTERY BYPASS GRAFT  2006   Post CABG surgery with a LIMA to the LAD, a vein to the diagonal ,,a vein to the the obtuse marginal and vein to the PDA.  [name] CORONARY STENT PLACEMENT  2010  . EAR CYST EXCISION Right 10/05/2018   Procedure: Right Partial pinnectomy;  Surgeon: Izora Gala, MD;  Location: [place];  Service: ENT;  Laterality: Right;  . GRAFT(S) ANGIOGRAM  10/21/2011   Procedure: GRAFT(S) Cyril Loosen;  Surgeon: Leonie Man, MD;  Location: [facility] CATH LAB;  Service: Cardiovascular;;  . LEFT HEART CATHETERIZATION WITH CORONARY ANGIOGRAM N/A 10/21/2011   Procedure: LEFT HEART CATHETERIZATION WITH CORONARY ANGIOGRAM;  Surgeon: Leonie Man, MD;  Location[facility] CATH LAB;  Service: Cardiovascular;  Laterality: N/A;  . LYMPH NODE BIOPSY Right 10/05/2018   Procedure: sentinel NODE BIOPSY with right partial modified neck dissection;  Surgeon: Izora Gala, MD;  Location: [place];  Service: ENT;  Laterality: Right;  . PORTA CATH INSERTION N/A 07/12/2019   Procedure: PORTA CATH INSERTION;  Surgeon: Algernon Huxley, MD;  Location: [place] CV LAB;  Service: Cardiovascular;  Laterality: N/A;    Allergies  Allergen Reactions  . Codeine Nausea And Vomiting and Other (See Comments)    Pt states when he takes codeine, he throws up blood  . Niacin And Related Other (See Comments)    "made his body feel hot & he had a hard time breathing"    Current Outpatient Medications  Medication Sig Dispense Refill  . ALPRAZolam (XANAX) 0.25 MG tablet Take 0.25 mg by mouth 3 (three) times daily.     [name] amLODipine (NORVASC) 10 MG tablet Take 10 mg by mouth daily.    [name] aspirin EC 81 MG tablet Take 81 mg by mouth daily.    [name] atorvastatin (LIPITOR) 40 MG tablet TAKE 1 TABLET (40 MG TOTAL) BY MOUTH AT BEDTIME. KEEP OCTOBER APPOINTMENT 90  tablet 0  . Baclofen 5 MG TABS Take 5 mg by mouth 3 (three) times daily. If 5 mg tid is not working then can increase to 10 mg tid 90 tablet 0  . busPIRone (BUSPAR) 7.5 MG tablet Take 1 tablet by mouth 2 (two) times daily.    [name] dexamethasone (DECADRON) 4 MG tablet Take 2 tablets (8 mg total) by mouth daily. Start the day after chemotherapy for 1 day. 30 tablet 1  . doxycycline (VIBRA-TABS) 100 MG tablet Take 1 tablet (100 mg total) by mouth 2 (two) times daily. 20 tablet 0  . ezetimibe (ZETIA) 10 MG tablet TAKE 1 TABLET BY MOUTH EVERY DAY 90 tablet 0  . famotidine (PEPCID) 20 MG  tablet TAKE 1 TABLET (20 MG TOTAL) BY MOUTH DAILY. NEEDS APPOINTMENT FOR FUTURE REFILLS 90 tablet 2  . INCRUSE ELLIPTA 62.5 MCG/INH AEPB Inhale 1 puff into the lungs daily.   2  . Ipratropium-Albuterol (COMBIVENT RESPIMAT) 20-100 MCG/ACT AERS respimat Inhale 1 puff into the lungs every 6 (six) hours.    . isosorbide mononitrate (IMDUR) 60 MG 24 hr tablet TAKE 1 TABLET BY MOUTH EVERY DAY 90 tablet 1  . lidocaine-prilocaine (EMLA) cream Apply to affected area once 30 g 3  . LORazepam (ATIVAN) 0.5 MG tablet Take 1 tablet 30 minutes prior to each scan 2 tablet 0  . losartan-hydrochlorothiazide (HYZAAR) 100-12.5 MG tablet Take 1 tablet by mouth daily. 30 tablet 10  . magic mouthwash w/lidocaine SOLN Take 5 mLs by mouth 4 (four) times daily as needed for mouth pain. 460 mL 0  . metFORMIN (GLUCOPHAGE) 500 MG tablet TAKE 1 TABLET (500 MG TOTAL) BY MOUTH 2 (TWO) TIMES DAILY WITH A MEAL. 180 tablet 0  . nitroGLYCERIN (NITROSTAT) 0.4 MG SL tablet Place 0.4 mg under the tongue every 5 (five) minutes as needed for chest pain.    [name] nystatin (MYCOSTATIN) 100000 UNIT/ML suspension Take 5 mLs (500,000 Units total) by mouth 4 (four) times daily. 473 mL 0  . ondansetron (ZOFRAN) 8 MG tablet Take 1 tablet (8 mg total) by mouth 2 (two) times daily as needed for refractory nausea / vomiting. Start on day 3 after carboplatin chemo. (Patient not  taking: Reported on 10/28/2019) 30 tablet 1  . prochlorperazine (COMPAZINE) 10 MG tablet Take 1 tablet (10 mg total) by mouth every 6 (six) hours as needed (Nausea or vomiting). (Patient not taking: Reported on 10/28/2019) 30 tablet 1  . ranolazine (RANEXA) 1000 MG SR tablet TAKE 1 TABLET BY MOUTH TWICE A DAY 60 tablet 11  . traMADol (ULTRAM) 50 MG tablet Take 1 tablet (50 mg total) by mouth every 6 (six) hours as needed. 120 tablet 0  . bisoprolol (ZEBETA) 5 MG tablet Take 0.5 tablets (2.5 mg total) by mouth daily for 7 days, THEN 1 tablet (5 mg total) daily. 90 tablet 3   No current facility-administered medications for this visit.   Facility-Administered Medications Ordered in Other Visits  Medication Dose Route Frequency Provider Last Rate Last Admin  . heparin lock flush 100 unit/mL  500 Units Intravenous Once Sindy Guadeloupe, MD      . sodium chloride flush (NS) 0.9 % injection 10 mL  10 mL Intravenous PRN Sindy Guadeloupe, MD      . sodium chloride flush (NS) 0.9 % injection 10 mL  10 mL Intravenous PRN Sindy Guadeloupe, MD   10 mL at 08/31/19 1123    Social History   Socioeconomic History  . Marital status: Married    Spouse name: JoAnne Gruver  . Number of children: Not on file  . Years of education: Not on file  . Highest education level: Not on file  Occupational History  . Not on file  Tobacco Use  . Smoking status: Current Every Day Smoker    Packs/day: 2.00    Years: 50.00    Pack years: 100.00    Types: Cigarettes  . Smokeless tobacco: Former Systems developer    Types: [place] date: 01/23/2013  Substance and Sexual Activity  . Alcohol use: No  . Drug use: Not Currently  . Sexual activity: Not Currently  Other Topics Concern  . Not on file  Social  History Narrative   Lives with son and grandson   Social Determinants of Health   Financial Resource Strain:   . Difficulty of Paying Living Expenses: Not on file  Food Insecurity:   . Worried About Charity fundraiser in the  Last Year: Not on file  . Ran Out of Food in the Last Year: Not on file  Transportation Needs:   . Lack of Transportation (Medical): Not on file  . Lack of Transportation (Non-Medical): Not on file  Physical Activity:   . Days of Exercise per Week: Not on file  . Minutes of Exercise per Session: Not on file  Stress:   . Feeling of Stress : Not on file  Social Connections:   . Frequency of Communication with Friends and Family: Not on file  . Frequency of Social Gatherings with Friends and Family: Not on file  . Attends Religious Services: Not on file  . Active Member of Clubs or Organizations: Not on file  . Attends Archivist Meetings: Not on file  . Marital Status: Not on file  Intimate Partner Violence:   . Fear of Current or Ex-Partner: Not on file  . Emotionally Abused: Not on file  . Physically Abused: Not on file  . Sexually Abused: Not on file    Family History  Problem Relation Age of Onset  . Hypertension Mother   . Heart attack Mother   . Heart attack Father 74   Socially he continues to smoke.  He has been under increased stress.  His wife recently left him.  ROS General: Negative; No fevers, chills, or night sweats;  HEENT: Negative; No changes in vision or hearing, sinus congestion, difficulty swallowing Pulmonary: Positive for wheezing, shortness of breath, COPD/emphysema and large cell neuroendocrine tumor of his right upper lobe Cardiovascular: see HPI GI: Negative; No nausea, vomiting, diarrhea, or abdominal pain GU: Renal mass Musculoskeletal: Negative; no myalgias, joint pain, or weakness Hematologic/Oncology: Negative; no easy bruising, bleeding Endocrine: Positive for poorly controlled diabetes Neuro: Positive for neuropathy Skin: Negative; No rashes or skin lesions Psychiatric: Negative; No behavioral problems, depression Sleep: Positive for OSA on CPAP Other comprehensive 14 point system review is negative.  PE BP 134/72   Pulse (!)  102   Temp 98.4 F (36.9 C)   Ht '5\' 8"'$  (1.727 m)   Wt 175 lb 3.2 oz (79.5 kg)   SpO2 96%   BMI 26.64 kg/m .  Repeat blood pressure 118/64  Wt Readings from Last 3 Encounters:  10/29/19 174 lb 4.8 oz (79.1 kg)  10/27/19 175 lb 3.2 oz (79.5 kg)  10/26/19 175 lb (79.4 kg)   General: Alert, oriented, no distress.  Skin: normal turgor, no rashes, warm and dry HEENT: Normocephalic, atraumatic. Pupils equal round and reactive to light; sclera anicteric; extraocular muscles intact;  Nose without nasal septal hypertrophy Status post resection of half of his right ear Mouth/Parynx benign; Mallinpatti scale 3 Neck: No JVD, no carotid bruits; normal carotid upstroke Lungs: Diffuse rhonchi with poor breath sounds secondary to his significant emphysema Chest wall: without tenderness to palpitation Heart: PMI not displaced, tachycardic and regular at 102, s1 s2 normal, 1/6 systolic murmur, no diastolic murmur, no rubs, gallops, thrills, or heaves Abdomen: soft, nontender; no hepatosplenomehaly, BS+; abdominal aorta nontender and not dilated by palpation. Back: no CVA tenderness Pulses 2+ Musculoskeletal: full range of motion, normal strength, no joint deformities Extremities: no clubbing cyanosis or edema, Homan's sign negative  Neurologic: grossly nonfocal;  Cranial nerves grossly wnl Psychologic: Normal mood and affect   ECG (independently read by me): Sinus tachycardia at 102 bpm.  Isolated PVC.  QTc interval 466 ms.  PR interval 126 ms  March 2019 ECG (independently read by me): Normal sinus rhythm at 68 bpm.  No significant ST-T changes.  No ectopy.   October 2018 ECG (independently read by me): Normal sinus rhythm at 65 bpm.  Nonspecific ST changes.  QTc interval 455 ms.  March 2018 ECG (independently read by me):Normal sinus rhythm at 67 bpm.  Nonspecific ST changes.  December 2016 ECG (independently read by me): Normal sinus rhythm at 69 bpm.  Nonspecific T changes.  QTc interval  469 ms.  June 2016 ECG (independently read by me): Sinus bradycardia 59 bpm. Anterolateral ST changes  November 2015 ECG (independently read by me): Sinus rhythm at 69 bpm.  Nonspecific ST changes.  No ectopy.  QTc interval 458 ms  ECG (independently read by me): Sinus rhythm at 59 beats per minute. Nondiagnostic anterolateral T changes  LABS:  BMP Latest Ref Rng & Units 10/12/2019 09/28/2019 09/14/2019  Glucose 70 - 99 mg/dL 158(H) 181(H) 225(H)  BUN 8 - 23 mg/dL '14 9 11  '$ Creatinine 0.61 - 1.24 mg/dL 1.16 1.05 1.16  Sodium 135 - 145 mmol/L 135 136 136  Potassium 3.5 - 5.1 mmol/L 4.1 4.2 3.6  Chloride 98 - 111 mmol/L 98 102 105  CO2 22 - 32 mmol/L 26 26 21(L)  Calcium 8.9 - 10.3 mg/dL 9.4 9.0 8.8(L)   Hepatic Function Latest Ref Rng & Units 10/12/2019 09/28/2019 09/14/2019  Total Protein 6.5 - 8.1 g/dL 7.2 6.7 6.3(L)  Albumin 3.5 - 5.0 g/dL 2.9(L) 3.2(L) 2.9(L)  AST 15 - 41 U/L '15 16 20  '$ ALT 0 - 44 U/L '15 14 18  '$ Alk Phosphatase 38 - 126 U/L 80 77 100  Total Bilirubin 0.3 - 1.2 mg/dL 0.7 0.7 0.4   CBC Latest Ref Rng & Units 10/29/2019 10/12/2019 09/28/2019  WBC 4.0 - 10.5 K/uL 11.2(H) 2.5(L) 14.7(H)  Hemoglobin 13.0 - 17.0 g/dL 9.8(L) 7.5(L) 8.9(L)  Hematocrit 39.0 - 52.0 % 32.9(L) 23.3(L) 29.2(L)  Platelets 150 - 400 K/uL 240 70(L) 255   Lab Results  Component Value Date   MCV 102.8 (H) 10/29/2019   MCV 94.7 10/12/2019   MCV 97.3 09/28/2019   Lab Results  Component Value Date   TSH 3.53 01/03/2017   Lipid Panel     Component Value Date/Time   CHOL 169 01/03/2017 1303   TRIG 181 (H) 01/03/2017 1303   HDL 35 (L) 01/03/2017 1303   CHOLHDL 4.8 01/03/2017 1303   VLDL 36 (H) 01/03/2017 1303   LDLCALC 98 01/03/2017 1303     IMPRESSION:  1. Hyperlipidemia LDL goal <70   2. Coronary artery disease involving coronary bypass graft of native heart with angina pectoris ([place])   3. S/P CABG x 4, 2006.PCI 11/10, cath this adm- Med Rx   4. Pulmonary emphysema, unspecified  emphysema type ([place])   5. Essential hypertension   6. Preoperative clearance   7. Tobacco abuse       ASSESSMENT AND PLAN: Mr. Naramore is a 69 year old male who developed an acute coronary syndrome in March 2006. He underwent CABG revascularization surgery in 2006 and has undergone intervention to the ostium and proximal RCA graft as well as the native diagonal vessel since the graft to this vessel was occluded. He continues to smoke cigarettes and remotely had quit for short  duration.  He had smoked 2 packs per day for many years and currently is smoking 1 pack/day.  His most recent [place] study was unchanged from previously and showed a moderate region of scar involving his prior anterior wall without associated ischemia and EF 53%.  Based on that study he was given preoperative clearance for potential surgery last September 2020.  He subsequently been diagnosed with neuroendocrine tumor of the right upper lobe as well as a right renal mass.  He has been undergoing radiation therapy and chemotherapy for his neuroendocrine carcinoma and is scheduled to undergo robotic assisted left nephrectomy by Dr. Hollice [name] on December 13, 2019.  Presently, his ECG shows sinus tachycardia.  With his underlying CAD, I am recommending institution of low-dose cardioselective beta-blocker therapy and will institute bisoprolol initially at 2.5 mg for 1 week then he will increase this to 5 mg daily.  I will also schedule him for preoperative echo Doppler study to reassess systolic and diastolic function as well as valvular architecture.  His blood pressure today is relatively stable on his regimen segmental continuous telemetry in addition to isosorbide 60 mg and losartan HCT 100/12.5 mg.  BP should further improved with addition of this metoprolol.  He also is on Ranexa for his significant CAD which has stabilized symptomatology and is not had any anginal symptoms.  He is diabetic on Metformin.  He continues to take  atorvastatin 40 mg daily and Zetia for hyperlipidemia with target LDL less than 70.  A complete set of fasting laboratory will be obtained.  I will see him in 4 weeks for reevaluation and if he is stable at that time he will be given clearance for his planned laparoscopic nephrectomy.    He has significant airflow limitation documented on pulmonary function studies. His  [place] study was low risk and demonstrates moderate size distal antero-apical infarction with very minimal peri-infarction ischemia.  Kidneys have exertional shortness of breath, which most likely is related to his significant COPD.  He is on Combivent,Spiriva, incruse ellipta, and Advair for COPD.  His blood pressure today is stable on amlodipine 10 mg, losartan HCT 100/12.5, and metoprolol 100 mg twice a day.  I last saw him, I felt he was diabetic and not on therapy.  I initiated metformin in light of his hemoglobin A1c of 8.5.  When I last saw Ms. LDL was 98 on atorvastatin and Zetia 10 mg was added to his regimen in attempt to be more aggressive with LDL lowering.  Less than 70.  It does not appear that he has had follow-up blood work.  Since these medication adjustments.  These will need to be done.  I will check with his primary physician.  I again discussed importance of smoking cessation.  Time spent: 25 minutes Troy Sine, MD, [facility]  10/29/2019 3:16 PM

## 2019-10-28 ENCOUNTER — Other Ambulatory Visit: Payer: Self-pay

## 2019-10-28 ENCOUNTER — Encounter: Payer: Self-pay | Admitting: Oncology

## 2019-10-28 NOTE — Telephone Encounter (Signed)
I left a message for [facility] Urologic surgery scheduler pt will be having an echo on 11/08/19 and then will see Dr. [name] 12/02/19. Once pt has been seen and test complete we will send over if pt has been cleared. If any questions please call [phone].

## 2019-10-28 NOTE — Telephone Encounter (Signed)
I will forward to Dr. [name] for appt 12/02/19. I will remove from the pre op call back pool.

## 2019-10-28 NOTE — Progress Notes (Signed)
Patient stated that he had been doing well with no complaints. 

## 2019-10-28 NOTE — Telephone Encounter (Addendum)
Echo was ordered at appointment with Dr. [name] yesterday and scheduled for 11/08/2019. Follow-up appointment with Dr. [name] was then scheduled for 12/02/2019. Looks like procedure is not scheduled until 12/13/2019 so we can wait for Echo results and let Dr. [name] comment on pre-op risk assessment at next visit.   Pre-Op Covering Staff: Can you please let requesting surgeon's office know of need for Echo and follow appointment on 12/02/2019 prior to surgery?  Thank you!

## 2019-10-29 ENCOUNTER — Inpatient Hospital Stay: Payer: Medicare Other | Attending: Oncology

## 2019-10-29 ENCOUNTER — Inpatient Hospital Stay: Payer: Medicare Other

## 2019-10-29 ENCOUNTER — Inpatient Hospital Stay (HOSPITAL_BASED_OUTPATIENT_CLINIC_OR_DEPARTMENT_OTHER): Payer: Medicare Other | Admitting: Oncology

## 2019-10-29 ENCOUNTER — Other Ambulatory Visit: Payer: Self-pay

## 2019-10-29 ENCOUNTER — Encounter: Payer: Self-pay | Admitting: Cardiovascular Disease

## 2019-10-29 VITALS — BP 124/88 | HR 95 | Temp 96.4°F | Resp 18 | Wt 174.3 lb

## 2019-10-29 DIAGNOSIS — C3411 Malignant neoplasm of upper lobe, right bronchus or lung: Secondary | ICD-10-CM | POA: Insufficient documentation

## 2019-10-29 DIAGNOSIS — Z951 Presence of aortocoronary bypass graft: Secondary | ICD-10-CM | POA: Insufficient documentation

## 2019-10-29 DIAGNOSIS — E119 Type 2 diabetes mellitus without complications: Secondary | ICD-10-CM | POA: Diagnosis not present

## 2019-10-29 DIAGNOSIS — F17210 Nicotine dependence, cigarettes, uncomplicated: Secondary | ICD-10-CM | POA: Insufficient documentation

## 2019-10-29 DIAGNOSIS — Z79899 Other long term (current) drug therapy: Secondary | ICD-10-CM | POA: Diagnosis not present

## 2019-10-29 DIAGNOSIS — Z7982 Long term (current) use of aspirin: Secondary | ICD-10-CM | POA: Insufficient documentation

## 2019-10-29 DIAGNOSIS — C641 Malignant neoplasm of right kidney, except renal pelvis: Secondary | ICD-10-CM | POA: Diagnosis not present

## 2019-10-29 DIAGNOSIS — Z7952 Long term (current) use of systemic steroids: Secondary | ICD-10-CM | POA: Insufficient documentation

## 2019-10-29 DIAGNOSIS — I2510 Atherosclerotic heart disease of native coronary artery without angina pectoris: Secondary | ICD-10-CM | POA: Diagnosis not present

## 2019-10-29 DIAGNOSIS — E785 Hyperlipidemia, unspecified: Secondary | ICD-10-CM | POA: Diagnosis not present

## 2019-10-29 DIAGNOSIS — Z85819 Personal history of malignant neoplasm of unspecified site of lip, oral cavity, and pharynx: Secondary | ICD-10-CM | POA: Insufficient documentation

## 2019-10-29 DIAGNOSIS — Z7984 Long term (current) use of oral hypoglycemic drugs: Secondary | ICD-10-CM | POA: Insufficient documentation

## 2019-10-29 DIAGNOSIS — G4730 Sleep apnea, unspecified: Secondary | ICD-10-CM | POA: Insufficient documentation

## 2019-10-29 DIAGNOSIS — J449 Chronic obstructive pulmonary disease, unspecified: Secondary | ICD-10-CM | POA: Insufficient documentation

## 2019-10-29 DIAGNOSIS — Z85820 Personal history of malignant melanoma of skin: Secondary | ICD-10-CM | POA: Insufficient documentation

## 2019-10-29 DIAGNOSIS — R5381 Other malaise: Secondary | ICD-10-CM | POA: Diagnosis not present

## 2019-10-29 DIAGNOSIS — R5383 Other fatigue: Secondary | ICD-10-CM | POA: Diagnosis not present

## 2019-10-29 DIAGNOSIS — I10 Essential (primary) hypertension: Secondary | ICD-10-CM | POA: Insufficient documentation

## 2019-10-29 DIAGNOSIS — F419 Anxiety disorder, unspecified: Secondary | ICD-10-CM | POA: Diagnosis not present

## 2019-10-29 DIAGNOSIS — D649 Anemia, unspecified: Secondary | ICD-10-CM

## 2019-10-29 LAB — CBC WITH DIFFERENTIAL/PLATELET
Abs Immature Granulocytes: 0.07 K/uL (ref 0.00–0.07)
Basophils Absolute: 0.1 K/uL (ref 0.0–0.1)
Basophils Relative: 1 %
Eosinophils Absolute: 0 K/uL (ref 0.0–0.5)
Eosinophils Relative: 0 %
HCT: 32.9 % — ABNORMAL LOW (ref 39.0–52.0)
Hemoglobin: 9.8 g/dL — ABNORMAL LOW (ref 13.0–17.0)
Immature Granulocytes: 1 %
Lymphocytes Relative: 9 %
Lymphs Abs: 1 K/uL (ref 0.7–4.0)
MCH: 30.6 pg (ref 26.0–34.0)
MCHC: 29.8 g/dL — ABNORMAL LOW (ref 30.0–36.0)
MCV: 102.8 fL — ABNORMAL HIGH (ref 80.0–100.0)
Monocytes Absolute: 1.4 K/uL — ABNORMAL HIGH (ref 0.1–1.0)
Monocytes Relative: 13 %
Neutro Abs: 8.6 K/uL — ABNORMAL HIGH (ref 1.7–7.7)
Neutrophils Relative %: 76 %
Platelets: 240 K/uL (ref 150–400)
RBC: 3.2 MIL/uL — ABNORMAL LOW (ref 4.22–5.81)
RDW: 17.5 % — ABNORMAL HIGH (ref 11.5–15.5)
WBC: 11.2 K/uL — ABNORMAL HIGH (ref 4.0–10.5)
nRBC: 0 % (ref 0.0–0.2)

## 2019-10-29 LAB — SAMPLE TO BLOOD BANK

## 2019-10-29 NOTE — Progress Notes (Signed)
Pt states he stays hot a lot instead of cold. He opens the door to outside to get fresh air in when he is hot. Coughing up clear sputum. No different than before. He states that urology wants to take kidney out.

## 2019-11-01 ENCOUNTER — Encounter: Payer: Self-pay | Admitting: Urology

## 2019-11-01 ENCOUNTER — Ambulatory Visit: Payer: Medicare Other

## 2019-11-01 NOTE — Progress Notes (Signed)
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 As a part of his staging work-up he underwent CT chest as well as CT neck. 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 CT neck was unremarkable however CT chest showed a right upper lobe nodule 2.1 x 1.7 cm in with minimal spiculation as well as a 0.5 cm right lower lobe nodule. No mediastinal or hilar adenopathy was noted. Patient was referred to [facility] for further work-up given his preference. He underwent a PET CT scan on 06/10/2019 which showed a hypermetabolic right upper lobe lung nodule measuring 2 cm. Equivocal right infrahilar hypermetabolism with an SUV of 3.1. 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 Large right kidney mass measuring 9.7 x 9 cm. Patient underwent CT-guided lung biopsy which was consistent with large cell neuroendocrine carcinoma. Patient completed concurrent chemoradiation with carboplatin and etoposide on 09/28/2019.  Interval history-patient continues to feel fatigued but is feeling better overall since the last visit.  Also reports that his right-sided chest wall pain has resolved.  ECOG PS- 2 Pain scale- 0 Opioid associated constipation- no  Review of systems- Review of Systems  Constitutional: Positive  for malaise/fatigue. Negative for chills, fever and weight loss.  HENT: Negative for congestion, ear discharge and nosebleeds.   Eyes: Negative for blurred vision.  Respiratory: Positive for shortness of breath. Negative for cough, hemoptysis, sputum production and wheezing.   Cardiovascular: Negative for chest pain, palpitations, orthopnea and claudication.  Gastrointestinal: Negative for abdominal pain, blood in stool, constipation, diarrhea, heartburn, melena, nausea and vomiting.  Genitourinary: Negative for dysuria, flank pain, frequency, hematuria and urgency.  Musculoskeletal: Negative for back pain, joint pain and myalgias.  Skin: Negative for rash.  Neurological: Negative for dizziness, tingling, focal weakness, seizures, weakness and headaches.  Endo/Heme/Allergies: Does not bruise/bleed easily.  Psychiatric/Behavioral: Negative for depression and suicidal ideas. The patient does not have insomnia.        Allergies  Allergen Reactions  . Codeine Nausea And Vomiting and Other (See Comments)    Pt states when he takes codeine, he throws up blood  . Niacin And Related Other (See Comments)    "made his body feel hot & he had a hard time breathing"     Past Medical History:  Diagnosis Date  . Anxiety   . COPD (chronic obstructive pulmonary disease) ([place])   . Coronary artery disease 2006   Acute coronary syndrome in March 2006.   . Diabetes mellitus without complication ([place])   . Dyslipidemia   . Hypertension 10/09/2010   EF 40-45%  . Lung cancer ([place])   . 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 Sleep apnea    uses cpap machine     Past Surgical History:  Procedure Laterality Date  .  Arterial evalation      no evidence of ICA stenosis  . CARDIAC CATHETERIZATION    . CARDIAC SURGERY    . CORONARY ARTERY BYPASS GRAFT  2006   Post CABG surgery with a LIMA to the LAD, a vein to the diagonal ,,a vein to the the obtuse marginal and vein to the PDA.  [name] CORONARY  STENT PLACEMENT  2010  . EAR CYST EXCISION Right 10/05/2018   Procedure: Right Partial pinnectomy;  Surgeon: Izora Gala, MD;  Location: [place];  Service: ENT;  Laterality: Right;  . GRAFT(S) ANGIOGRAM  10/21/2011   Procedure: GRAFT(S) Cyril Loosen;  Surgeon: Leonie Man, MD;  Location: [facility] CATH LAB;  Service: Cardiovascular;;  . LEFT HEART CATHETERIZATION WITH CORONARY ANGIOGRAM N/A 10/21/2011   Procedure: LEFT HEART CATHETERIZATION WITH CORONARY ANGIOGRAM;  Surgeon: Leonie Man, MD;  Location: [facility] CATH LAB;  Service: Cardiovascular;  Laterality: N/A;  . LYMPH NODE BIOPSY Right 10/05/2018   Procedure: sentinel NODE BIOPSY with right partial modified neck dissection;  Surgeon: Izora Gala, MD;  Location: [place];  Service: ENT;  Laterality: Right;  . PORTA CATH INSERTION N/A 07/12/2019   Procedure: PORTA CATH INSERTION;  Surgeon: Algernon Huxley, MD;  Location: [place] CV LAB;  Service: Cardiovascular;  Laterality: N/A;    Social History   Socioeconomic History  . Marital status: Married    Spouse name: JoAnne Mckibbin  . Number of children: Not on file  . Years of education: Not on file  . Highest education level: Not on file  Occupational History  . Not on file  Tobacco Use  . Smoking status: Current Every Day Smoker    Packs/day: 2.00    Years: 50.00    Pack years: 100.00    Types: Cigarettes  . Smokeless tobacco: Former Systems developer    Types: [place] date: 01/23/2013  Substance and Sexual Activity  . Alcohol use: No  . Drug use: Not Currently  . Sexual activity: Not Currently  Other Topics Concern  . Not on file  Social History Narrative   Lives with son and grandson   Social Determinants of Health   Financial Resource Strain:   . Difficulty of Paying Living Expenses: Not on file  Food Insecurity:   . Worried About Charity fundraiser in the Last Year: Not on file  . Ran Out of Food in the Last Year: Not on file  Transportation Needs:   . Lack of Transportation  (Medical): Not on file  . Lack of Transportation (Non-Medical): Not on file  Physical Activity:   . Days of Exercise per Week: Not on file  . Minutes of Exercise per Session: Not on file  Stress:   . Feeling of Stress : Not on file  Social Connections:   . Frequency of Communication with Friends and Family: Not on file  . Frequency of Social Gatherings with Friends and Family: Not on file  . Attends Religious Services: Not on file  . Active Member of Clubs or Organizations: Not on file  . Attends Archivist Meetings: Not on file  . Marital Status: Not on file  Intimate Partner Violence:   . Fear of Current or Ex-Partner: Not on file  . Emotionally Abused: Not on file  . Physically Abused: Not on file  . Sexually Abused: Not on file    Family History  Problem Relation Age of Onset  . Hypertension Mother   . Heart attack  Mother   . Heart attack Father 47     Current Outpatient Medications:  .  ALPRAZolam (XANAX) 0.25 MG tablet, Take 0.25 mg by mouth 3 (three) times daily. , Disp: , Rfl:  .  amLODipine (NORVASC) 10 MG tablet, Take 10 mg by mouth daily., Disp: , Rfl:  .  aspirin EC 81 MG tablet, Take 81 mg by mouth daily., Disp: , Rfl:  .  atorvastatin (LIPITOR) 40 MG tablet, TAKE 1 TABLET (40 MG TOTAL) BY MOUTH AT BEDTIME. KEEP OCTOBER APPOINTMENT, Disp: 90 tablet, Rfl: 0 .  Baclofen 5 MG TABS, Take 5 mg by mouth 3 (three) times daily. If 5 mg tid is not working then can increase to 10 mg tid, Disp: 90 tablet, Rfl: 0 .  bisoprolol (ZEBETA) 5 MG tablet, Take 0.5 tablets (2.5 mg total) by mouth daily for 7 days, THEN 1 tablet (5 mg total) daily., Disp: 90 tablet, Rfl: 3 .  busPIRone (BUSPAR) 7.5 MG tablet, Take 1 tablet by mouth 2 (two) times daily., Disp: , Rfl:  .  dexamethasone (DECADRON) 4 MG tablet, Take 2 tablets (8 mg total) by mouth daily. Start the day after chemotherapy for 1 day., Disp: 30 tablet, Rfl: 1 .  doxycycline (VIBRA-TABS) 100 MG tablet, Take 1 tablet  (100 mg total) by mouth 2 (two) times daily., Disp: 20 tablet, Rfl: 0 .  ezetimibe (ZETIA) 10 MG tablet, TAKE 1 TABLET BY MOUTH EVERY DAY, Disp: 90 tablet, Rfl: 0 .  famotidine (PEPCID) 20 MG tablet, TAKE 1 TABLET (20 MG TOTAL) BY MOUTH DAILY. NEEDS APPOINTMENT FOR FUTURE REFILLS, Disp: 90 tablet, Rfl: 2 .  INCRUSE ELLIPTA 62.5 MCG/INH AEPB, Inhale 1 puff into the lungs daily. , Disp: , Rfl: 2 .  Ipratropium-Albuterol (COMBIVENT RESPIMAT) 20-100 MCG/ACT AERS respimat, Inhale 1 puff into the lungs every 6 (six) hours., Disp: , Rfl:  .  isosorbide mononitrate (IMDUR) 60 MG 24 hr tablet, TAKE 1 TABLET BY MOUTH EVERY DAY, Disp: 90 tablet, Rfl: 1 .  lidocaine-prilocaine (EMLA) cream, Apply to affected area once, Disp: 30 g, Rfl: 3 .  LORazepam (ATIVAN) 0.5 MG tablet, Take 1 tablet 30 minutes prior to each scan, Disp: 2 tablet, Rfl: 0 .  losartan-hydrochlorothiazide (HYZAAR) 100-12.5 MG tablet, Take 1 tablet by mouth daily., Disp: 30 tablet, Rfl: 10 .  magic mouthwash w/lidocaine SOLN, Take 5 mLs by mouth 4 (four) times daily as needed for mouth pain., Disp: 460 mL, Rfl: 0 .  metFORMIN (GLUCOPHAGE) 500 MG tablet, TAKE 1 TABLET (500 MG TOTAL) BY MOUTH 2 (TWO) TIMES DAILY WITH A MEAL., Disp: 180 tablet, Rfl: 0 .  nystatin (MYCOSTATIN) 100000 UNIT/ML suspension, Take 5 mLs (500,000 Units total) by mouth 4 (four) times daily., Disp: 473 mL, Rfl: 0 .  ranolazine (RANEXA) 1000 MG SR tablet, TAKE 1 TABLET BY MOUTH TWICE A DAY, Disp: 60 tablet, Rfl: 11 .  traMADol (ULTRAM) 50 MG tablet, Take 1 tablet (50 mg total) by mouth every 6 (six) hours as needed., Disp: 120 tablet, Rfl: 0 .  nitroGLYCERIN (NITROSTAT) 0.4 MG SL tablet, Place 0.4 mg under the tongue every 5 (five) minutes as needed for chest pain., Disp: , Rfl:  .  ondansetron (ZOFRAN) 8 MG tablet, Take 1 tablet (8 mg total) by mouth 2 (two) times daily as needed for refractory nausea / vomiting. Start on day 3 after carboplatin chemo. (Patient not taking:  Reported on 10/28/2019), Disp: 30 tablet, Rfl: 1 .  prochlorperazine (COMPAZINE) 10 MG tablet,  Take 1 tablet (10 mg total) by mouth every 6 (six) hours as needed (Nausea or vomiting). (Patient not taking: Reported on 10/28/2019), Disp: 30 tablet, Rfl: 1 No current facility-administered medications for this visit.  Facility-Administered Medications Ordered in Other Visits:  .  heparin lock flush 100 unit/mL, 500 Units, Intravenous, Once, Sindy Guadeloupe, MD .  sodium chloride flush (NS) 0.9 % injection 10 mL, 10 mL, Intravenous, PRN, Sindy Guadeloupe, MD .  sodium chloride flush (NS) 0.9 % injection 10 mL, 10 mL, Intravenous, PRN, Sindy Guadeloupe, MD, 10 mL at 08/31/19 1123  Physical exam:  Vitals:   10/29/19 0914 10/29/19 0915  BP: 124/88   Pulse: 95   Resp: 18   Temp:  (!) 96.4 F (35.8 C)  TempSrc: Tympanic Tympanic  Weight: 174 lb 4.8 oz (79.1 kg)    Physical Exam HENT:     Head: Normocephalic and atraumatic.  Eyes:     Pupils: Pupils are equal, round, and reactive to light.  Cardiovascular:     Rate and Rhythm: Normal rate and regular rhythm.     Heart sounds: Normal heart sounds.  Pulmonary:     Effort: Pulmonary effort is normal. No respiratory distress.     Breath sounds: Normal breath sounds.  Abdominal:     General: Bowel sounds are normal.     Palpations: Abdomen is soft.  Musculoskeletal:     Cervical back: Normal range of motion.  Skin:    General: Skin is warm and dry.  Neurological:     Mental Status: He is alert and oriented to person, place, and time.      CMP Latest Ref Rng & Units 10/12/2019  Glucose 70 - 99 mg/dL 158(H)  BUN 8 - 23 mg/dL 14  Creatinine 0.61 - 1.24 mg/dL 1.16  Sodium 135 - 145 mmol/L 135  Potassium 3.5 - 5.1 mmol/L 4.1  Chloride 98 - 111 mmol/L 98  CO2 22 - 32 mmol/L 26  Calcium 8.9 - 10.3 mg/dL 9.4  Total Protein 6.5 - 8.1 g/dL 7.2  Total Bilirubin 0.3 - 1.2 mg/dL 0.7  Alkaline Phos 38 - 126 U/L 80  AST 15 - 41 U/L 15  ALT 0 - 44  U/L 15   CBC Latest Ref Rng & Units 10/29/2019  WBC 4.0 - 10.5 K/uL 11.2(H)  Hemoglobin 13.0 - 17.0 g/dL 9.8(L)  Hematocrit 39.0 - 52.0 % 32.9(L)  Platelets 150 - 400 K/uL 240    No images are attached to the encounter.  CT Chest W Contrast  Result Date: 10/14/2019 CLINICAL DATA:  Malignant neoplasm of right upper lobe. Melanoma. Increased shortness of breath. Weight loss. EXAM: CT CHEST, ABDOMEN, AND PELVIS WITH CONTRAST TECHNIQUE: Multidetector CT imaging of the chest, abdomen and pelvis was performed following the standard protocol during bolus administration of intravenous contrast. CONTRAST:  162mL OMNIPAQUE IOHEXOL 300 MG/ML  SOLN COMPARISON:  Abdominal MRI of 07/28/2019. Outside chest CT of 05/28/2019, report not available. FINDINGS: CT CHEST FINDINGS Cardiovascular: Left Port-A-Cath tip at left brachiocephalic vein. Advanced aortic and branch vessel atherosclerosis. Median sternotomy for CABG. Normal heart size with left ventricular apical infarct. No pericardial effusion. No central pulmonary embolism, on this non-dedicated study. Mediastinum/Nodes: No mediastinal or hilar adenopathy. Lungs/Pleura: Trace right pleural fluid. Lower lobe predominant bronchial wall thickening. Moderate centrilobular emphysema. Dominant right upper lobe pulmonary nodule measures 1.5 x 1.5 cm on 57/4 versus 1.9 x 1.7 cm on 05/28/2019 (when remeasured). Pleural-based airspace disease within the immediately  caudal right upper lobe including on 67/4, could be radiation induced. Left lower lobe scarring. Significantly worsened right-sided aeration. Right lower and less so right upper lobe progressive peribronchovascular nodularity and nodular airspace disease. Musculoskeletal: No acute osseous abnormality. CT ABDOMEN PELVIS FINDINGS Hepatobiliary: Normal liver. Small gallstones without acute cholecystitis or biliary duct dilatation. Pancreas: Normal, without mass or ductal dilatation. Spleen: Normal in size, without  focal abnormality. Adrenals/Urinary Tract: Left adrenal nodule, including at 2.9 x 2.5 cm, similar and consistent with an adenoma on prior MRI. Mild right adrenal nodularity is unchanged. Punctate bilateral renal collecting system calculi. Bilateral renal cysts. Lower pole right renal heterogeneously enhancing mass, consistent with renal cell carcinoma. On the order of 9.2 x 9.2 cm, including on 24/7. Similar to 07/28/2019. No hydronephrosis. Normal urinary bladder. Stomach/Bowel: Normal stomach, without wall thickening. Colonic stool burden suggests constipation. Normal terminal ileum and appendix. Normal small bowel. Vascular/Lymphatic: Advanced abdominal aortic atherosclerosis. Focal outpouching at the 4 o'clock position of the juxtarenal aorta measures 9 mm on 65/2 and is relatively similar. Patent renal veins. No abdominopelvic adenopathy. Reproductive: Normal prostate. Other: Small bilateral fat containing inguinal hernias. Musculoskeletal: No acute osseous abnormality. IMPRESSION: CT CHEST IMPRESSION 1. Since the outside chest CT of 05/28/2019, decrease in size of a right upper lobe pulmonary nodule. 2. No thoracic adenopathy. 3. Significantly worsened right-sided aeration, with peribronchovascular nodularity and nodular consolidation. Considerations include acute on chronic atypical infection. Aspiration felt less likely, given lack of significant left-sided abnormality. 4. Aortic atherosclerosis (ICD10-I70.0) and emphysema (ICD10-J43.9). 5. Left Port-A-Cath tip at left brachiocephalic vein. CT ABDOMEN AND PELVIS IMPRESSION 1. Since 07/28/2019 MRI, similar right right-sided renal cell carcinoma. 2. Similar left adrenal nodule, consistent with an adenoma on prior MRI. No findings of metastatic disease within the abdomen or pelvis. 3. Cholelithiasis. 4. Bilateral nephrolithiasis. 5. Similar focal aortic outpouching which could represent saccular aneurysm or penetrating ulcer. These results will be called to  the ordering clinician or representative by the Radiologist Assistant, and communication documented in the PACS or zVision Dashboard. Electronically Signed   By: Abigail Miyamoto M.D.   On: 10/14/2019 15:20   CT Abdomen Pelvis W Contrast  Result Date: 10/14/2019 CLINICAL DATA:  Malignant neoplasm of right upper lobe. Melanoma. Increased shortness of breath. Weight loss. EXAM: CT CHEST, ABDOMEN, AND PELVIS WITH CONTRAST TECHNIQUE: Multidetector CT imaging of the chest, abdomen and pelvis was performed following the standard protocol during bolus administration of intravenous contrast. CONTRAST:  149mL OMNIPAQUE IOHEXOL 300 MG/ML  SOLN COMPARISON:  Abdominal MRI of 07/28/2019. Outside chest CT of 05/28/2019, report not available. FINDINGS: CT CHEST FINDINGS Cardiovascular: Left Port-A-Cath tip at left brachiocephalic vein. Advanced aortic and branch vessel atherosclerosis. Median sternotomy for CABG. Normal heart size with left ventricular apical infarct. No pericardial effusion. No central pulmonary embolism, on this non-dedicated study. Mediastinum/Nodes: No mediastinal or hilar adenopathy. Lungs/Pleura: Trace right pleural fluid. Lower lobe predominant bronchial wall thickening. Moderate centrilobular emphysema. Dominant right upper lobe pulmonary nodule measures 1.5 x 1.5 cm on 57/4 versus 1.9 x 1.7 cm on 05/28/2019 (when remeasured). Pleural-based airspace disease within the immediately caudal right upper lobe including on 67/4, could be radiation induced. Left lower lobe scarring. Significantly worsened right-sided aeration. Right lower and less so right upper lobe progressive peribronchovascular nodularity and nodular airspace disease. Musculoskeletal: No acute osseous abnormality. CT ABDOMEN PELVIS FINDINGS Hepatobiliary: Normal liver. Small gallstones without acute cholecystitis or biliary duct dilatation. Pancreas: Normal, without mass or ductal dilatation. Spleen: Normal in size, without  focal abnormality.  Adrenals/Urinary Tract: Left adrenal nodule, including at 2.9 x 2.5 cm, similar and consistent with an adenoma on prior MRI. Mild right adrenal nodularity is unchanged. Punctate bilateral renal collecting system calculi. Bilateral renal cysts. Lower pole right renal heterogeneously enhancing mass, consistent with renal cell carcinoma. On the order of 9.2 x 9.2 cm, including on 24/7. Similar to 07/28/2019. No hydronephrosis. Normal urinary bladder. Stomach/Bowel: Normal stomach, without wall thickening. Colonic stool burden suggests constipation. Normal terminal ileum and appendix. Normal small bowel. Vascular/Lymphatic: Advanced abdominal aortic atherosclerosis. Focal outpouching at the 4 o'clock position of the juxtarenal aorta measures 9 mm on 65/2 and is relatively similar. Patent renal veins. No abdominopelvic adenopathy. Reproductive: Normal prostate. Other: Small bilateral fat containing inguinal hernias. Musculoskeletal: No acute osseous abnormality. IMPRESSION: CT CHEST IMPRESSION 1. Since the outside chest CT of 05/28/2019, decrease in size of a right upper lobe pulmonary nodule. 2. No thoracic adenopathy. 3. Significantly worsened right-sided aeration, with peribronchovascular nodularity and nodular consolidation. Considerations include acute on chronic atypical infection. Aspiration felt less likely, given lack of significant left-sided abnormality. 4. Aortic atherosclerosis (ICD10-I70.0) and emphysema (ICD10-J43.9). 5. Left Port-A-Cath tip at left brachiocephalic vein. CT ABDOMEN AND PELVIS IMPRESSION 1. Since 07/28/2019 MRI, similar right right-sided renal cell carcinoma. 2. Similar left adrenal nodule, consistent with an adenoma on prior MRI. No findings of metastatic disease within the abdomen or pelvis. 3. Cholelithiasis. 4. Bilateral nephrolithiasis. 5. Similar focal aortic outpouching which could represent saccular aneurysm or penetrating ulcer. These results will be called to the ordering  clinician or representative by the Radiologist Assistant, and communication documented in the PACS or zVision Dashboard. Electronically Signed   By: Abigail Miyamoto M.D.   On: 10/14/2019 15:20     Assessment and plan- Patient is a 69 y.o. male with stage I large cell neuroendocrine tumor of the right lung.he is s/p 4 cycles of carboplatin/etoposide chemotherapy.  He is here to discuss CT scan results and further management  1.  Patient has completed his course of steroids as well as doxycycline which was started after he was found to have concerning signs and symptoms of infection and possible pneumonia in his right lower lobe.  Since then patient reports feeling better in terms of his fatigue and shortness of breath.  I have also reviewed his counts which have improved and his hemoglobin is up to one 9.8.    2. I have reviewed CT chest abdomen pelvis images independently and discussed findings with the patient.Overall his right upper lobe pulmonary nodule has decreased in size and there is no noted thoracic adenopathy.  He has therefore responded to treatment and does not require any further therapy at this time.  Patient will continue to get surveillance scans every 3 to 6 months at this time.  His performance status is likely to improve further and his counts as well over the next 1 month and it would be okay for him to proceed with right radical nephrectomy for renal cell carcinoma at that time.  He has already seen Dr. [name] for this  Repeat labs CBC with differential end of February right before his surgery.  I will tentatively see him back in 4 months with CBC with differential, CMP with CT chest abdomen and pelvis with contrast prior   Visit Diagnosis 1. Malignant neoplasm of upper lobe of right lung ([place])   2. Renal cell carcinoma of right kidney ([facility])      Dr. Randa Evens, MD, MPH [facility]  at [facility] [phone] 11/01/2019 8:56 AM

## 2019-11-02 ENCOUNTER — Encounter: Payer: Self-pay | Admitting: Urology

## 2019-11-02 ENCOUNTER — Other Ambulatory Visit: Payer: Self-pay | Admitting: Radiology

## 2019-11-02 DIAGNOSIS — C641 Malignant neoplasm of right kidney, except renal pelvis: Secondary | ICD-10-CM

## 2019-11-04 ENCOUNTER — Telehealth: Payer: Self-pay | Admitting: *Deleted

## 2019-11-04 NOTE — Telephone Encounter (Addendum)
Wife called stating that patient is supposed to have the COVID vaccine prior to surgery, but when she calls the number to schedule it, she gets a recoding that it is full and cannot leave a message. She does not have internet access to schedule online for the waiting list and is asking for assistance withthis. Please return her call [phone]

## 2019-11-05 NOTE — Telephone Encounter (Signed)
Called patient's wife-Troy [name] and told her that unfortunately we are not able to do much of nothing for the patient. I told her that she would have to call daily so she could get an appointment. Troy [name] is worried that her husband would not get his vaccine prior to his upcoming surgery with Dr. [name] on 12/13/2019. I told her that she still had time. Troy [name] stated that she would continue to call until she gets an appointment. Troy [name] had no further questions.

## 2019-11-08 ENCOUNTER — Ambulatory Visit (HOSPITAL_COMMUNITY): Payer: Medicare Other | Attending: Cardiology

## 2019-11-08 ENCOUNTER — Other Ambulatory Visit: Payer: Self-pay

## 2019-11-08 DIAGNOSIS — I77810 Thoracic aortic ectasia: Secondary | ICD-10-CM

## 2019-11-08 DIAGNOSIS — Z01818 Encounter for other preprocedural examination: Secondary | ICD-10-CM | POA: Diagnosis not present

## 2019-11-08 DIAGNOSIS — Z8249 Family history of ischemic heart disease and other diseases of the circulatory system: Secondary | ICD-10-CM | POA: Insufficient documentation

## 2019-11-08 DIAGNOSIS — I2510 Atherosclerotic heart disease of native coronary artery without angina pectoris: Secondary | ICD-10-CM | POA: Diagnosis not present

## 2019-11-08 DIAGNOSIS — Z951 Presence of aortocoronary bypass graft: Secondary | ICD-10-CM | POA: Diagnosis not present

## 2019-11-08 DIAGNOSIS — I080 Rheumatic disorders of both mitral and aortic valves: Secondary | ICD-10-CM | POA: Diagnosis not present

## 2019-11-08 DIAGNOSIS — I10 Essential (primary) hypertension: Secondary | ICD-10-CM | POA: Diagnosis not present

## 2019-11-08 DIAGNOSIS — E785 Hyperlipidemia, unspecified: Secondary | ICD-10-CM | POA: Insufficient documentation

## 2019-11-08 DIAGNOSIS — F17200 Nicotine dependence, unspecified, uncomplicated: Secondary | ICD-10-CM | POA: Diagnosis not present

## 2019-11-08 DIAGNOSIS — E119 Type 2 diabetes mellitus without complications: Secondary | ICD-10-CM | POA: Insufficient documentation

## 2019-11-08 DIAGNOSIS — J449 Chronic obstructive pulmonary disease, unspecified: Secondary | ICD-10-CM | POA: Diagnosis not present

## 2019-11-15 DIAGNOSIS — Z01818 Encounter for other preprocedural examination: Secondary | ICD-10-CM | POA: Diagnosis not present

## 2019-11-15 DIAGNOSIS — C641 Malignant neoplasm of right kidney, except renal pelvis: Secondary | ICD-10-CM

## 2019-11-15 DIAGNOSIS — E785 Hyperlipidemia, unspecified: Secondary | ICD-10-CM | POA: Diagnosis not present

## 2019-11-15 HISTORY — DX: Malignant neoplasm of right kidney, except renal pelvis: C64.1

## 2019-11-15 LAB — COMPREHENSIVE METABOLIC PANEL WITH GFR
ALT: 6 IU/L (ref 0–44)
AST: 9 IU/L (ref 0–40)
Albumin/Globulin Ratio: 1.2 (ref 1.2–2.2)
Albumin: 3.7 g/dL — ABNORMAL LOW (ref 3.8–4.8)
Alkaline Phosphatase: 88 IU/L (ref 39–117)
BUN/Creatinine Ratio: 8 — ABNORMAL LOW (ref 10–24)
BUN: 9 mg/dL (ref 8–27)
Bilirubin Total: 0.4 mg/dL (ref 0.0–1.2)
CO2: 25 mmol/L (ref 20–29)
Calcium: 9.6 mg/dL (ref 8.6–10.2)
Chloride: 102 mmol/L (ref 96–106)
Creatinine, Ser: 1.12 mg/dL (ref 0.76–1.27)
GFR calc Af Amer: 77 mL/min/1.73
GFR calc non Af Amer: 66 mL/min/1.73
Globulin, Total: 3 g/dL (ref 1.5–4.5)
Glucose: 125 mg/dL — ABNORMAL HIGH (ref 65–99)
Potassium: 5 mmol/L (ref 3.5–5.2)
Sodium: 142 mmol/L (ref 134–144)
Total Protein: 6.7 g/dL (ref 6.0–8.5)

## 2019-11-15 LAB — CBC
Hematocrit: 34.5 % — ABNORMAL LOW (ref 37.5–51.0)
Hemoglobin: 10.9 g/dL — ABNORMAL LOW (ref 13.0–17.7)
MCH: 30.6 pg (ref 26.6–33.0)
MCHC: 31.6 g/dL (ref 31.5–35.7)
MCV: 97 fL (ref 79–97)
Platelets: 282 10*3/uL (ref 150–450)
RBC: 3.56 x10E6/uL — ABNORMAL LOW (ref 4.14–5.80)
RDW: 14.8 % (ref 11.6–15.4)
WBC: 12.9 10*3/uL — ABNORMAL HIGH (ref 3.4–10.8)

## 2019-11-15 LAB — LIPID PANEL
Chol/HDL Ratio: 3.4 ratio (ref 0.0–5.0)
Cholesterol, Total: 100 mg/dL (ref 100–199)
HDL: 29 mg/dL — ABNORMAL LOW
LDL Chol Calc (NIH): 51 mg/dL (ref 0–99)
Triglycerides: 109 mg/dL (ref 0–149)
VLDL Cholesterol Cal: 20 mg/dL (ref 5–40)

## 2019-11-15 LAB — TSH: TSH: 3.35 u[IU]/mL (ref 0.450–4.500)

## 2019-11-18 ENCOUNTER — Telehealth: Payer: Self-pay

## 2019-11-18 NOTE — Telephone Encounter (Addendum)
Left a voice message for the patient   ----- Message from Troy Sine, MD sent at 11/16/2019  9:29 AM EST ----- Labs stable.  Glucose improved but still slightly elevated.  Anemia is present, but improved.  Lipids stable although HDL low.  TSH normal  ECHO results:  Per Dr. [name], your heart pumping function (LVEF) is low/normal. There is mild thickening of the left ventricle. There is mild impaired relaxation of the heart muscle. There is mild enlargement of the atrium. There is mild calcification of the mitral valve. He will review in detail at your upcoming visit.

## 2019-12-01 ENCOUNTER — Encounter: Payer: Self-pay | Admitting: Urology

## 2019-12-02 ENCOUNTER — Ambulatory Visit: Payer: Medicare Other | Admitting: Cardiovascular Disease

## 2019-12-03 ENCOUNTER — Other Ambulatory Visit: Payer: Self-pay | Admitting: Radiology

## 2019-12-03 ENCOUNTER — Other Ambulatory Visit: Payer: Medicare Other

## 2019-12-03 ENCOUNTER — Ambulatory Visit (INDEPENDENT_AMBULATORY_CARE_PROVIDER_SITE_OTHER): Payer: Medicare Other | Admitting: Physician Assistant

## 2019-12-03 ENCOUNTER — Encounter: Payer: Self-pay | Admitting: Physician Assistant

## 2019-12-03 ENCOUNTER — Other Ambulatory Visit: Payer: Self-pay

## 2019-12-03 ENCOUNTER — Other Ambulatory Visit
Admission: RE | Admit: 2019-12-03 | Discharge: 2019-12-03 | Disposition: A | Discharge status: Home / Self Care | Payer: Medicare Other | Source: Ambulatory Visit | Attending: Urology | Admitting: Urology

## 2019-12-03 VITALS — BP 110/60 | HR 88 | Temp 99.0°F | Ht 68.0 in | Wt 173.2 lb

## 2019-12-03 DIAGNOSIS — C641 Malignant neoplasm of right kidney, except renal pelvis: Secondary | ICD-10-CM

## 2019-12-03 DIAGNOSIS — C3411 Malignant neoplasm of upper lobe, right bronchus or lung: Secondary | ICD-10-CM

## 2019-12-03 DIAGNOSIS — Z01818 Encounter for other preprocedural examination: Secondary | ICD-10-CM

## 2019-12-03 DIAGNOSIS — E119 Type 2 diabetes mellitus without complications: Secondary | ICD-10-CM | POA: Diagnosis not present

## 2019-12-03 DIAGNOSIS — G4733 Obstructive sleep apnea (adult) (pediatric): Secondary | ICD-10-CM

## 2019-12-03 DIAGNOSIS — I10 Essential (primary) hypertension: Secondary | ICD-10-CM

## 2019-12-03 DIAGNOSIS — I25810 Atherosclerosis of coronary artery bypass graft(s) without angina pectoris: Secondary | ICD-10-CM

## 2019-12-03 DIAGNOSIS — E785 Hyperlipidemia, unspecified: Secondary | ICD-10-CM

## 2019-12-03 DIAGNOSIS — Z9989 Dependence on other enabling machines and devices: Secondary | ICD-10-CM

## 2019-12-03 LAB — URINALYSIS, COMPLETE (UACMP) WITH MICROSCOPIC
Bacteria, UA: NONE SEEN
Bilirubin Urine: NEGATIVE
Glucose, UA: NEGATIVE mg/dL
Hgb urine dipstick: NEGATIVE
Ketones, ur: NEGATIVE mg/dL
Nitrite: NEGATIVE
Protein, ur: NEGATIVE mg/dL
Specific Gravity, Urine: 1.008 (ref 1.005–1.030)
pH: 7 (ref 5.0–8.0)

## 2019-12-03 NOTE — Progress Notes (Signed)
Cardiology Office Note:    Date:  12/05/2019   ID:  NUH LIPTON, DOB 07/21/1951, MRN [id number]  PCP:  Troy Marble, MD  Cardiologist:  Troy Majestic, MD  Electrophysiologist:  None   Referring MD: Troy Marble, MD   Chief Complaint  Patient presents with  . Pre-op Exam    upcoming surgery by Dr. Erlene [name]    History of Present Illness:    Troy [name] is a 69 y.o. male with a hx of CAD s/p CABG (LIMA-LAD, SVG-OM, SVG-PDA, SVG-RCA), COPD, HTN, HLD, DM II, obstructive sleep apnea on CPAP and metastatic lung cancer.  Patient underwent PCI to ostial proximal graft to RCA in November 2010 as well as native diagonal.  Last cardiac catheterization in January 2013 showed patent stent in the SVG to RCA, LIMA graft was small and nearly atretic, vein graft to the diagonal was occluded.  Patent vein graft to OM was widely patent.  He was diagnosed with right lung cancer in 2020.  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  I last saw the patient in August 2020, Myoview obtained on 06/16/2019 consistent with prior infarction in anterior wall without associated ischemia, EF 53%.  He underwent several cycles of chemotherapy and radiation therapy.  He is also found to have a 11 cm lower pole right renal mass and is scheduled for robotic assisted laparoscopic nephrectomy on 12/13/2019.  Dr. [name] started the patient on bisoprolol.  Echocardiogram performed on 11/08/2019 showed EF 50%, grade 1 DD, apical hypokinesis, mild biatrial enlargement.  Overall reassuring study.  Patient presents today for cardiac clearance prior to right nephrectomy procedure by Dr. Erlene [name].  He denies any chest pain or shortness of breath.  He has no lower extremity edema, orthopnea or PND.  Unfortunately he continues to smoke to this day.  Emphasis has been placed on tobacco cessation.  He is tolerating the low-dose bisoprolol without any issue.  He is cleared to proceed with right nephrectomy procedure  from cardiology perspective.  The last dose of aspirin will be this Sunday, after that he will need to hold the aspirin for 7 days prior to the procedure and he can restart aspirin soon as possible after the procedure at the discretion of Dr. Erlene [name] based on the bleeding risk.    Past Medical History:  Diagnosis Date  . Anxiety   . COPD (chronic obstructive pulmonary disease) ([place])   . Coronary artery disease 2006   Acute coronary syndrome in March 2006.   . Diabetes mellitus without complication ([place])   . Dyslipidemia   . Hypertension 10/09/2010   EF 40-45%  . Lung cancer ([place])   . 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 Sleep apnea    uses cpap machine    Past Surgical History:  Procedure Laterality Date  . Arterial evalation      no evidence of ICA stenosis  . CARDIAC CATHETERIZATION    . CARDIAC SURGERY    . CORONARY ARTERY BYPASS GRAFT  2006   Post CABG surgery with a LIMA to the LAD, a vein to the diagonal ,,a vein to the the obtuse marginal and vein to the PDA.  Troy [name] CORONARY STENT PLACEMENT  2010  . EAR CYST EXCISION Right 10/05/2018   Procedure: Right Partial pinnectomy;  Surgeon: Izora Gala, MD;  Location: [facility];  Service: ENT;  Laterality: Right;  . GRAFT(S) ANGIOGRAM  10/21/2011   Procedure: GRAFT(S) Cyril Loosen;  Surgeon: Shanon Brow  Loren Racer, MD;  Location: [facility] CATH LAB;  Service: Cardiovascular;;  . LEFT HEART CATHETERIZATION WITH CORONARY ANGIOGRAM N/A 10/21/2011   Procedure: LEFT HEART CATHETERIZATION WITH CORONARY ANGIOGRAM;  Surgeon: Leonie Man, MD;  Location: [facility] CATH LAB;  Service: Cardiovascular;  Laterality: N/A;  . LYMPH NODE BIOPSY Right 10/05/2018   Procedure: sentinel NODE BIOPSY with right partial modified neck dissection;  Surgeon: Izora Gala, MD;  Location: [place];  Service: ENT;  Laterality: Right;  . PORTA CATH INSERTION N/A 07/12/2019   Procedure: PORTA CATH INSERTION;  Surgeon: Algernon Huxley, MD;  Location: [facility] CV LAB;  Service:  Cardiovascular;  Laterality: N/A;    Current Medications: Current Meds  Medication Sig  . ALPRAZolam (XANAX) 0.25 MG tablet Take 0.25 mg by mouth 3 (three) times daily as needed for anxiety or sleep.   Troy [name] amLODipine (NORVASC) 10 MG tablet Take 5 mg by mouth daily.   Troy [name] aspirin EC 81 MG tablet Take 81 mg by mouth daily.  Troy [name] atorvastatin (LIPITOR) 40 MG tablet TAKE 1 TABLET (40 MG TOTAL) BY MOUTH AT BEDTIME. KEEP OCTOBER APPOINTMENT (Patient taking differently: Take 40 mg by mouth at bedtime. )  . Baclofen 5 MG TABS Take 5 mg by mouth 3 (three) times daily. If 5 mg tid is not working then can increase to 10 mg tid  . bisoprolol (ZEBETA) 5 MG tablet Take 0.5 tablets (2.5 mg total) by mouth daily for 7 days, THEN 1 tablet (5 mg total) daily.  . busPIRone (BUSPAR) 7.5 MG tablet Take 7.5 mg by mouth 2 (two) times daily.   Troy [name] dexamethasone (DECADRON) 4 MG tablet Take 2 tablets (8 mg total) by mouth daily. Start the day after chemotherapy for 1 day.  Troy [name] doxycycline (VIBRA-TABS) 100 MG tablet Take 1 tablet (100 mg total) by mouth 2 (two) times daily.  Troy [name] ezetimibe (ZETIA) 10 MG tablet TAKE 1 TABLET BY MOUTH EVERY DAY (Patient taking differently: Take 10 mg by mouth daily. )  . famotidine (PEPCID) 20 MG tablet TAKE 1 TABLET (20 MG TOTAL) BY MOUTH DAILY. NEEDS APPOINTMENT FOR FUTURE REFILLS (Patient taking differently: Take 20 mg by mouth daily as needed for heartburn or indigestion. )  . Fluticasone-Umeclidin-Vilant (TRELEGY ELLIPTA) 100-62.5-25 MCG/INH AEPB Inhale 1 puff into the lungs daily.  . INCRUSE ELLIPTA 62.5 MCG/INH AEPB Inhale 1 puff into the lungs daily.   . Ipratropium-Albuterol (COMBIVENT RESPIMAT) 20-100 MCG/ACT AERS respimat Inhale 1 puff into the lungs every 6 (six) hours as needed for wheezing or shortness of breath.   . isosorbide mononitrate (IMDUR) 60 MG 24 hr tablet TAKE 1 TABLET BY MOUTH EVERY DAY  . lidocaine-prilocaine (EMLA) cream Apply to affected area once (Patient taking differently:  Apply 1 application topically daily as needed (prior to chemo). )  . LORazepam (ATIVAN) 0.5 MG tablet Take 1 tablet 30 minutes prior to each scan  . losartan-hydrochlorothiazide (HYZAAR) 100-12.5 MG tablet Take 1 tablet by mouth daily.  . magic mouthwash w/lidocaine SOLN Take 5 mLs by mouth 4 (four) times daily as needed for mouth pain.  . metFORMIN (GLUCOPHAGE) 500 MG tablet TAKE 1 TABLET (500 MG TOTAL) BY MOUTH 2 (TWO) TIMES DAILY WITH A MEAL. (Patient taking differently: Take 500 mg by mouth daily with breakfast. )  . nystatin (MYCOSTATIN) 100000 UNIT/ML suspension Take 5 mLs (500,000 Units total) by mouth 4 (four) times daily.  . ondansetron (ZOFRAN) 8 MG tablet Take 1 tablet (8 mg total) by mouth 2 (two) times  daily as needed for refractory nausea / vomiting. Start on day 3 after carboplatin chemo.  . prochlorperazine (COMPAZINE) 10 MG tablet Take 1 tablet (10 mg total) by mouth every 6 (six) hours as needed (Nausea or vomiting).  . ranolazine (RANEXA) 1000 MG SR tablet TAKE 1 TABLET BY MOUTH TWICE A DAY (Patient taking differently: Take 1,000 mg by mouth 2 (two) times daily. )  . traMADol (ULTRAM) 50 MG tablet Take 1 tablet (50 mg total) by mouth every 6 (six) hours as needed.     Allergies:   Codeine and Niacin and related   Social History   Socioeconomic History  . Marital status: Married    Spouse name: JoAnne Riese  . Number of children: Not on file  . Years of education: Not on file  . Highest education level: Not on file  Occupational History  . Not on file  Tobacco Use  . Smoking status: Current Every Day Smoker    Packs/day: 2.00    Years: 50.00    Pack years: 100.00    Types: Cigarettes  . Smokeless tobacco: Former Systems developer    Types: [place] date: 01/23/2013  Substance and Sexual Activity  . Alcohol use: No  . Drug use: Not Currently  . Sexual activity: Not Currently  Other Topics Concern  . Not on file  Social History Narrative   Lives with son and grandson    Social Determinants of Health   Financial Resource Strain:   . Difficulty of Paying Living Expenses: Not on file  Food Insecurity:   . Worried About Charity fundraiser in the Last Year: Not on file  . Ran Out of Food in the Last Year: Not on file  Transportation Needs:   . Lack of Transportation (Medical): Not on file  . Lack of Transportation (Non-Medical): Not on file  Physical Activity:   . Days of Exercise per Week: Not on file  . Minutes of Exercise per Session: Not on file  Stress:   . Feeling of Stress : Not on file  Social Connections:   . Frequency of Communication with Friends and Family: Not on file  . Frequency of Social Gatherings with Friends and Family: Not on file  . Attends Religious Services: Not on file  . Active Member of Clubs or Organizations: Not on file  . Attends Archivist Meetings: Not on file  . Marital Status: Not on file     Family History: The patient's family history includes Heart attack in his mother; Heart attack (age of onset: 25) in his father; Hypertension in his mother.  ROS:   Please see the history of present illness.     All other systems reviewed and are negative.  EKGs/Labs/Other Studies Reviewed:    The following studies were reviewed today:  Myoview 06/16/2019  The left ventricular ejection fraction is mildly decreased (45-54%).  Nuclear stress EF: 53%.  There was no ST segment deviation noted during stress.  Defect 1: There is a large defect of severe severity present in the mid anterior, apical anterior, apical septal, apical lateral and apex location.  Findings consistent with prior myocardial infarction.  This is an intermediate risk study due to prior infarct.  There is no ischemia.   Echo 11/08/2019 IMPRESSIONS  1. Left ventricular ejection fraction, by visual estimation, is 50%. The  left ventricle has mildly decreased function. There is mildly increased  left ventricular hypertrophy.  2. Left  ventricular diastolic  parameters are consistent with Grade I diastolic dysfunction (impaired relaxation).  3. Small left ventricular internal cavity size.  4. The left ventricle demonstrates regional wall motion abnormalities.  Apical hypokinesis.  5. Global right ventricle has normal systolic function.The right  ventricular size is normal. No increase in right ventricular wall  thickness.  6. Left atrial size was mildly dilated.  7. Right atrial size was mildly dilated.  8. Mild mitral annular calcification.  9. The mitral valve is normal in structure. No evidence of mitral valve  regurgitation. No evidence of mitral stenosis.  10. The tricuspid valve is normal in structure. Tricuspid valve  regurgitation is not demonstrated.  11. The aortic valve is tricuspid. Aortic valve regurgitation is not  visualized. Mild aortic valve sclerosis without stenosis.  12. There is mild dilatation of the ascending aorta measuring 38 mm.  13. The inferior vena cava is normal in size with greater than 50%  respiratory variability, suggesting right atrial pressure of 3 mmHg.  14. The tricuspid regurgitant velocity is 2.25 m/s, and with an assumed  right atrial pressure of 3 mmHg, the estimated right ventricular systolic  pressure is normal at 23.2 mmHg.   EKG:  EKG is ordered today.  The ekg ordered today demonstrates normal sinus rhythm without significant ST-T wave changes  Recent Labs: 11/15/2019: ALT 6; BUN 9; Creatinine, Ser 1.12; Hemoglobin 10.9; Platelets 282; Potassium 5.0; Sodium 142; TSH 3.350  Recent Lipid Panel    Component Value Date/Time   CHOL 100 11/15/2019 0827   TRIG 109 11/15/2019 0827   HDL 29 (L) 11/15/2019 0827   CHOLHDL 3.4 11/15/2019 0827   CHOLHDL 4.8 01/03/2017 1303   VLDL 36 (H) 01/03/2017 1303   LDLCALC 51 11/15/2019 0827    Physical Exam:    VS:  BP 110/60   Pulse 88   Temp 99 F (37.2 C)   Ht 5\' 8"  (1.727 m)   Wt 173 lb 3.2 oz (78.6 kg)   SpO2 97%    BMI 26.33 kg/m     Wt Readings from Last 3 Encounters:  12/03/19 173 lb 3.2 oz (78.6 kg)  10/29/19 174 lb 4.8 oz (79.1 kg)  10/27/19 175 lb 3.2 oz (79.5 kg)     GEN:  Well nourished, well developed in no acute distress HEENT: Normal NECK: No JVD; No carotid bruits LYMPHATICS: No lymphadenopathy CARDIAC: RRR, no murmurs, rubs, gallops RESPIRATORY:  Clear to auscultation without rales, wheezing or rhonchi  ABDOMEN: Soft, non-tender, non-distended MUSCULOSKELETAL:  No edema; No deformity  SKIN: Warm and dry NEUROLOGIC:  Alert and oriented x 3 PSYCHIATRIC:  Normal affect   ASSESSMENT:    1. Pre-procedural examination   2. Coronary artery disease involving coronary bypass graft of native heart without angina pectoris   3. Essential hypertension   4. Hyperlipidemia LDL goal <70   5. Controlled type 2 diabetes mellitus without complication, without long-term current use of insulin ([place])   6. OSA on CPAP   7. Malignant neoplasm of upper lobe of right lung (HCC)    PLAN:    In order of problems listed above:  1. Preoperative clearance: Patient is cleared to proceed with nephrectomy surgery.  He will need to hold aspirin for 7 days prior to the procedure and restart as soon as possible after the procedure.  Recent echocardiogram and Myoview were reassuring.  2. CAD s/p CABG: Recent Myoview obtained in September 2020 showed EF 53%, fixed defect without ischemia  3. Hypertension: Blood pressure  stable  4. Hyperlipidemia: On Lipitor  5. DM2: Managed by primary care provider  6. Obstructive sleep apnea: CPAP therapy  7. History of right lung cancer: Status post chemo and radiation therapy   Medication Adjustments/Labs and Tests Ordered: Current medicines are reviewed at length with the patient today.  Concerns regarding medicines are outlined above.  Orders Placed This Encounter  Procedures  . EKG 12-Lead   No orders of the defined types were placed in this  encounter.   Patient Instructions  Medication Instructions:   TAKE last dose of Aspirin on Sunday 12/05/2019, then hold for 7 days for surgery/procedure  *If you need a refill on your cardiac medications before your next appointment, please call your pharmacy*  Lab Work: NONE ordered at this time of appointment   If you have labs (blood work) drawn today and your tests are completely normal, you will receive your results only by: Troy [name] MyChart Message (if you have MyChart) OR . A paper copy in the mail If you have any lab test that is abnormal or we need to change your treatment, we will call you to review the results.  Testing/Procedures: NONE ordered at this time of appointment   Follow-Up: At [facility], you and your health needs are our priority.  As part of our continuing mission to provide you with exceptional heart care, we have created designated Provider Care Teams.  These Care Teams include your primary Cardiologist (physician) and Advanced Practice Providers (APPs -  Physician Assistants and Nurse Practitioners) who all work together to provide you with the care you need, when you need it.  Your next appointment:   4-5 month(s)  The format for your next appointment:   In Person  Provider:   [name], MD  Other Instructions ]    Signed, Almyra Deforest, [place]  12/05/2019 12:29 AM    [place]

## 2019-12-03 NOTE — Progress Notes (Signed)
   Primary Cardiologist: [name], MD  [name] (DOB 08-05-1951) was seen in the cardiology office today for cardiac clearance.  Recent Myoview showed no ischemia.  Echocardiogram showed a baseline ejection fraction of 50%.  He denies any chest pain or shortness of breath.  He is cleared to proceed with upcoming right nephrectomy without further work-up.  I instructed the patient to hold aspirin for 7 days prior to the procedure and restart aspirin as soon as possible after the procedure at the discretion of the surgeon based on the bleeding risk.  [place], [place] 12/03/2019, 2:51 PM

## 2019-12-03 NOTE — Patient Instructions (Addendum)
Medication Instructions:   TAKE last dose of Aspirin on Sunday 12/05/2019, then hold for 7 days for surgery/procedure  *If you need a refill on your cardiac medications before your next appointment, please call your pharmacy*  Lab Work: NONE ordered at this time of appointment   If you have labs (blood work) drawn today and your tests are completely normal, you will receive your results only by: [name] MyChart Message (if you have MyChart) OR . A paper copy in the mail If you have any lab test that is abnormal or we need to change your treatment, we will call you to review the results.  Testing/Procedures: NONE ordered at this time of appointment   Follow-Up: At [facility], you and your health needs are our priority.  As part of our continuing mission to provide you with exceptional heart care, we have created designated Provider Care Teams.  These Care Teams include your primary Cardiologist (physician) and Advanced Practice Providers (APPs -  Physician Assistants and Nurse Practitioners) who all work together to provide you with the care you need, when you need it.  Your next appointment:   4-5 month(s)  The format for your next appointment:   In Person  Provider:   [name], MD  Other Instructions ]

## 2019-12-03 NOTE — Telephone Encounter (Signed)
LMOM to return call regarding MyChart message sent by daughter.

## 2019-12-04 LAB — URINE CULTURE: Culture: NO GROWTH

## 2019-12-05 ENCOUNTER — Encounter: Payer: Self-pay | Admitting: Physician Assistant

## 2019-12-06 ENCOUNTER — Other Ambulatory Visit: Payer: Self-pay

## 2019-12-06 ENCOUNTER — Other Ambulatory Visit: Payer: Medicare Other

## 2019-12-06 ENCOUNTER — Encounter
Admission: RE | Admit: 2019-12-06 | Discharge: 2019-12-06 | Disposition: A | Discharge status: Home / Self Care | Payer: Medicare Other | Source: Ambulatory Visit | Attending: Urology | Admitting: Urology

## 2019-12-06 HISTORY — DX: Acute myocardial infarction, unspecified: I21.9

## 2019-12-06 NOTE — Patient Instructions (Signed)
Your procedure is scheduled on: 12/13/19 Report to [facility]. To find out your arrival time please call [phone] between 1PM - 3PM on 12/10/19.  Remember: Instructions that are not followed completely may result in serious medical risk, up to and including death, or upon the discretion of your surgeon and anesthesiologist your surgery may need to be rescheduled.     _X__ 1. Do not eat food after midnight the night before your procedure.                 No gum chewing or hard candies. You may drink clear liquids up to 2 hours                 before you are scheduled to arrive for your surgery- DO not drink clear                 liquids within 2 hours of the start of your surgery.                 Clear Liquids include:  water, apple juice without pulp, clear carbohydrate                 drink such as Clearfast or Gatorade, Black Coffee or Tea (Do not add                 anything to coffee or tea). Diabetics water only  __X__2.  On the morning of surgery brush your teeth with toothpaste and water, you                 may rinse your mouth with mouthwash if you wish.  Do not swallow any              toothpaste of mouthwash.     _X__ 3.  No Alcohol for 24 hours before or after surgery.   _X__ 4.  Do Not Smoke or use e-cigarettes For 24 Hours Prior to Your Surgery.                 Do not use any chewable tobacco products for at least 6 hours prior to                 surgery.  ____  5.  Bring all medications with you on the day of surgery if instructed.   __X__  6.  Notify your doctor if there is any change in your medical condition      (cold, fever, infections).     Do not wear jewelry, make-up, hairpins, clips or nail polish. Do not wear lotions, powders, or perfumes.  Do not shave 48 hours prior to surgery. Men may shave face and neck. Do not bring valuables to the hospital.    [facility] is not responsible for any belongings or  valuables.  Contacts, dentures/partials or body piercings may not be worn into surgery. Bring a case for your contacts, glasses or hearing aids, a denture cup will be supplied. Leave your suitcase in the car. After surgery it may be brought to your room. For patients admitted to the hospital, discharge time is determined by your treatment team.   Patients discharged the day of surgery will not be allowed to drive home.   Please read over the following fact sheets that you were given:   MRSA Information  __X__ Take these medicines the morning of surgery with A SIP OF WATER:  1. bisoprolol (ZEBETA) 5 MG tablet  2. busPIRone (BUSPAR) 7.5 MG tablet  3. ezetimibe (ZETIA) 10 MG tablet  4. ranolazine (RANEXA) 1000 MG SR tablet  5.  6.  ____ Fleet Enema (as directed)   __X__ Use CHG Soap/SAGE wipes as directed  __X__ Use inhalers on the day of surgery  __X__ Stop metformin/Janumet/Farxiga 2 days prior to surgery    ____ Take 1/2 of usual insulin dose the night before surgery. No insulin the morning          of surgery.   ____ Stop Blood Thinners Coumadin/Plavix/Xarelto/Pleta/Pradaxa/Eliquis/Effient/Aspirin  on   Or contact your Surgeon, Cardiologist or Medical Doctor regarding  ability to stop your blood thinners  __X__ Stop Anti-inflammatories 7 days before surgery such as Advil, Ibuprofen, Motrin,  BC or Goodies Powder, Naprosyn, Naproxen, Aleve, Aspirin    __X__ Stop all herbal supplements, fish oil or vitamin E until after surgery.    ____ Bring C-Pap to the hospital. ([place])

## 2019-12-07 ENCOUNTER — Other Ambulatory Visit: Payer: Self-pay

## 2019-12-07 ENCOUNTER — Inpatient Hospital Stay: Payer: Medicare Other | Attending: Oncology

## 2019-12-07 DIAGNOSIS — C3411 Malignant neoplasm of upper lobe, right bronchus or lung: Secondary | ICD-10-CM | POA: Insufficient documentation

## 2019-12-07 DIAGNOSIS — C641 Malignant neoplasm of right kidney, except renal pelvis: Secondary | ICD-10-CM | POA: Insufficient documentation

## 2019-12-07 LAB — CBC WITH DIFFERENTIAL/PLATELET
Abs Immature Granulocytes: 0.07 10*3/uL (ref 0.00–0.07)
Basophils Absolute: 0.1 10*3/uL (ref 0.0–0.1)
Basophils Relative: 1 %
Eosinophils Absolute: 0.2 10*3/uL (ref 0.0–0.5)
Eosinophils Relative: 1 %
HCT: 35.2 % — ABNORMAL LOW (ref 39.0–52.0)
Hemoglobin: 10.8 g/dL — ABNORMAL LOW (ref 13.0–17.0)
Immature Granulocytes: 1 %
Lymphocytes Relative: 14 %
Lymphs Abs: 1.5 10*3/uL (ref 0.7–4.0)
MCH: 29.5 pg (ref 26.0–34.0)
MCHC: 30.7 g/dL (ref 30.0–36.0)
MCV: 96.2 fL (ref 80.0–100.0)
Monocytes Absolute: 1 10*3/uL (ref 0.1–1.0)
Monocytes Relative: 9 %
Neutro Abs: 8.5 10*3/uL — ABNORMAL HIGH (ref 1.7–7.7)
Neutrophils Relative %: 74 %
Platelets: 303 10*3/uL (ref 150–400)
RBC: 3.66 MIL/uL — ABNORMAL LOW (ref 4.22–5.81)
RDW: 14 % (ref 11.5–15.5)
WBC: 11.3 10*3/uL — ABNORMAL HIGH (ref 4.0–10.5)
nRBC: 0 % (ref 0.0–0.2)

## 2019-12-07 LAB — COMPREHENSIVE METABOLIC PANEL
ALT: 9 U/L (ref 0–44)
AST: 14 U/L — ABNORMAL LOW (ref 15–41)
Albumin: 3.4 g/dL — ABNORMAL LOW (ref 3.5–5.0)
Alkaline Phosphatase: 73 U/L (ref 38–126)
Anion gap: 8 (ref 5–15)
BUN: 12 mg/dL (ref 8–23)
CO2: 28 mmol/L (ref 22–32)
Calcium: 9.6 mg/dL (ref 8.9–10.3)
Chloride: 101 mmol/L (ref 98–111)
Creatinine, Ser: 1.03 mg/dL (ref 0.61–1.24)
GFR calc Af Amer: >60 mL/min (ref >60)
GFR calc non Af Amer: >60 mL/min (ref >60)
Glucose, Bld: 106 mg/dL — ABNORMAL HIGH (ref 70–99)
Potassium: 4.4 mmol/L (ref 3.5–5.1)
Sodium: 137 mmol/L (ref 135–145)
Total Bilirubin: 0.5 mg/dL (ref 0.3–1.2)
Total Protein: 7.7 g/dL (ref 6.5–8.1)

## 2019-12-09 ENCOUNTER — Other Ambulatory Visit: Payer: Self-pay

## 2019-12-09 ENCOUNTER — Other Ambulatory Visit
Admission: RE | Admit: 2019-12-09 | Discharge: 2019-12-09 | Disposition: A | Discharge status: Home / Self Care | Payer: Medicare Other | Source: Ambulatory Visit | Attending: Urology | Admitting: Urology

## 2019-12-09 DIAGNOSIS — Z20822 Contact with and (suspected) exposure to covid-19: Secondary | ICD-10-CM | POA: Insufficient documentation

## 2019-12-09 DIAGNOSIS — Z01812 Encounter for preprocedural laboratory examination: Secondary | ICD-10-CM | POA: Insufficient documentation

## 2019-12-09 LAB — CBC
HCT: 36.4 % — ABNORMAL LOW (ref 39.0–52.0)
Hemoglobin: 11.5 g/dL — ABNORMAL LOW (ref 13.0–17.0)
MCH: 29.9 pg (ref 26.0–34.0)
MCHC: 31.6 g/dL (ref 30.0–36.0)
MCV: 94.8 fL (ref 80.0–100.0)
Platelets: 307 10*3/uL (ref 150–400)
RBC: 3.84 MIL/uL — ABNORMAL LOW (ref 4.22–5.81)
RDW: 13.9 % (ref 11.5–15.5)
WBC: 12.2 10*3/uL — ABNORMAL HIGH (ref 4.0–10.5)
nRBC: 0 % (ref 0.0–0.2)

## 2019-12-09 LAB — PROTIME-INR
INR: 1.1 (ref 0.8–1.2)
Prothrombin Time: 13.6 seconds (ref 11.4–15.2)

## 2019-12-09 LAB — BASIC METABOLIC PANEL
Anion gap: 8 (ref 5–15)
BUN: 10 mg/dL (ref 8–23)
CO2: 30 mmol/L (ref 22–32)
Calcium: 9.6 mg/dL (ref 8.9–10.3)
Chloride: 103 mmol/L (ref 98–111)
Creatinine, Ser: 1.14 mg/dL (ref 0.61–1.24)
GFR calc Af Amer: >60 mL/min (ref >60)
GFR calc non Af Amer: >60 mL/min (ref >60)
Glucose, Bld: 154 mg/dL — ABNORMAL HIGH (ref 70–99)
Potassium: 4.6 mmol/L (ref 3.5–5.1)
Sodium: 141 mmol/L (ref 135–145)

## 2019-12-09 LAB — SARS CORONAVIRUS 2 (TAT 6-24 HRS): SARS Coronavirus 2: NEGATIVE

## 2019-12-10 LAB — TYPE AND SCREEN
ABO/RH(D): O NEG
Antibody Screen: NEGATIVE
Extend sample reason: TRANSFUSED

## 2019-12-13 ENCOUNTER — Inpatient Hospital Stay: Payer: Medicare Other | Admitting: Registered Nurse

## 2019-12-13 ENCOUNTER — Other Ambulatory Visit: Payer: Self-pay

## 2019-12-13 ENCOUNTER — Inpatient Hospital Stay
Admission: RE | Admit: 2019-12-13 | Discharge: 2019-12-15 | DRG: 656 | Disposition: A | Discharge status: Home Health | Payer: Medicare Other | Attending: Internal Medicine | Admitting: Internal Medicine

## 2019-12-13 ENCOUNTER — Encounter
Admission: RE | Disposition: A | Discharge status: Home Health | Payer: Self-pay | Source: Home / Self Care | Attending: Urology

## 2019-12-13 ENCOUNTER — Encounter: Payer: Self-pay | Admitting: Urology

## 2019-12-13 DIAGNOSIS — F17210 Nicotine dependence, cigarettes, uncomplicated: Secondary | ICD-10-CM | POA: Diagnosis present

## 2019-12-13 DIAGNOSIS — J449 Chronic obstructive pulmonary disease, unspecified: Secondary | ICD-10-CM

## 2019-12-13 DIAGNOSIS — Z7984 Long term (current) use of oral hypoglycemic drugs: Secondary | ICD-10-CM

## 2019-12-13 DIAGNOSIS — C641 Malignant neoplasm of right kidney, except renal pelvis: Principal | ICD-10-CM

## 2019-12-13 DIAGNOSIS — J9601 Acute respiratory failure with hypoxia: Secondary | ICD-10-CM

## 2019-12-13 DIAGNOSIS — C7A1 Malignant poorly differentiated neuroendocrine tumors: Secondary | ICD-10-CM

## 2019-12-13 DIAGNOSIS — Z951 Presence of aortocoronary bypass graft: Secondary | ICD-10-CM | POA: Diagnosis not present

## 2019-12-13 DIAGNOSIS — Z85820 Personal history of malignant melanoma of skin: Secondary | ICD-10-CM | POA: Diagnosis not present

## 2019-12-13 DIAGNOSIS — E785 Hyperlipidemia, unspecified: Secondary | ICD-10-CM | POA: Diagnosis not present

## 2019-12-13 DIAGNOSIS — I2510 Atherosclerotic heart disease of native coronary artery without angina pectoris: Secondary | ICD-10-CM | POA: Diagnosis present

## 2019-12-13 DIAGNOSIS — G4733 Obstructive sleep apnea (adult) (pediatric): Secondary | ICD-10-CM | POA: Diagnosis present

## 2019-12-13 DIAGNOSIS — Z7952 Long term (current) use of systemic steroids: Secondary | ICD-10-CM

## 2019-12-13 DIAGNOSIS — N200 Calculus of kidney: Secondary | ICD-10-CM | POA: Diagnosis present

## 2019-12-13 DIAGNOSIS — I110 Hypertensive heart disease with heart failure: Secondary | ICD-10-CM | POA: Diagnosis present

## 2019-12-13 DIAGNOSIS — C7A8 Other malignant neuroendocrine tumors: Secondary | ICD-10-CM

## 2019-12-13 DIAGNOSIS — I5020 Unspecified systolic (congestive) heart failure: Secondary | ICD-10-CM | POA: Diagnosis not present

## 2019-12-13 DIAGNOSIS — J441 Chronic obstructive pulmonary disease with (acute) exacerbation: Secondary | ICD-10-CM | POA: Diagnosis not present

## 2019-12-13 DIAGNOSIS — Z7982 Long term (current) use of aspirin: Secondary | ICD-10-CM | POA: Diagnosis not present

## 2019-12-13 DIAGNOSIS — I5022 Chronic systolic (congestive) heart failure: Secondary | ICD-10-CM | POA: Diagnosis not present

## 2019-12-13 DIAGNOSIS — E119 Type 2 diabetes mellitus without complications: Secondary | ICD-10-CM | POA: Diagnosis not present

## 2019-12-13 DIAGNOSIS — I255 Ischemic cardiomyopathy: Secondary | ICD-10-CM | POA: Diagnosis not present

## 2019-12-13 DIAGNOSIS — I10 Essential (primary) hypertension: Secondary | ICD-10-CM | POA: Diagnosis not present

## 2019-12-13 DIAGNOSIS — I252 Old myocardial infarction: Secondary | ICD-10-CM | POA: Diagnosis not present

## 2019-12-13 DIAGNOSIS — Z20822 Contact with and (suspected) exposure to covid-19: Secondary | ICD-10-CM | POA: Diagnosis present

## 2019-12-13 DIAGNOSIS — D649 Anemia, unspecified: Secondary | ICD-10-CM

## 2019-12-13 DIAGNOSIS — C3411 Malignant neoplasm of upper lobe, right bronchus or lung: Secondary | ICD-10-CM

## 2019-12-13 DIAGNOSIS — Z923 Personal history of irradiation: Secondary | ICD-10-CM | POA: Diagnosis not present

## 2019-12-13 DIAGNOSIS — Z79899 Other long term (current) drug therapy: Secondary | ICD-10-CM | POA: Diagnosis not present

## 2019-12-13 DIAGNOSIS — Z85819 Personal history of malignant neoplasm of unspecified site of lip, oral cavity, and pharynx: Secondary | ICD-10-CM

## 2019-12-13 DIAGNOSIS — N2889 Other specified disorders of kidney and ureter: Secondary | ICD-10-CM

## 2019-12-13 DIAGNOSIS — J9622 Acute and chronic respiratory failure with hypercapnia: Secondary | ICD-10-CM | POA: Diagnosis not present

## 2019-12-13 DIAGNOSIS — Z955 Presence of coronary angioplasty implant and graft: Secondary | ICD-10-CM | POA: Diagnosis not present

## 2019-12-13 DIAGNOSIS — R0902 Hypoxemia: Secondary | ICD-10-CM

## 2019-12-13 DIAGNOSIS — Z9221 Personal history of antineoplastic chemotherapy: Secondary | ICD-10-CM

## 2019-12-13 DIAGNOSIS — R918 Other nonspecific abnormal finding of lung field: Secondary | ICD-10-CM | POA: Diagnosis not present

## 2019-12-13 DIAGNOSIS — J9621 Acute and chronic respiratory failure with hypoxia: Secondary | ICD-10-CM | POA: Diagnosis not present

## 2019-12-13 HISTORY — PX: ROBOT ASSISTED LAPAROSCOPIC NEPHRECTOMY: SHX5140

## 2019-12-13 LAB — GLUCOSE, CAPILLARY
Glucose-Capillary: 124 mg/dL — ABNORMAL HIGH (ref 70–99)
Glucose-Capillary: 160 mg/dL — ABNORMAL HIGH (ref 70–99)

## 2019-12-13 SURGERY — NEPHRECTOMY, RADICAL, ROBOT-ASSISTED, LAPAROSCOPIC, ADULT
Anesthesia: General | Laterality: Right

## 2019-12-13 MED ORDER — DEXAMETHASONE SODIUM PHOSPHATE 10 MG/ML IJ SOLN
INTRAMUSCULAR | Status: DC | PRN
  Administered 2019-12-13: 8 mg via INTRAVENOUS

## 2019-12-13 MED ORDER — DEXAMETHASONE SODIUM PHOSPHATE 10 MG/ML IJ SOLN
INTRAMUSCULAR | Status: AC
  Filled 2019-12-13: qty 1

## 2019-12-13 MED ORDER — ACETAMINOPHEN 325 MG PO TABS
650.0000 mg | ORAL_TABLET | ORAL | Status: DC | PRN
  Administered 2019-12-14 – 2019-12-15 (×2): 650 mg via ORAL
  Filled 2019-12-13 (×2): qty 2

## 2019-12-13 MED ORDER — DIPHENHYDRAMINE HCL 12.5 MG/5ML PO ELIX
12.5000 mg | ORAL_SOLUTION | Freq: Four times a day (QID) | ORAL | Status: DC | PRN
  Filled 2019-12-13: qty 5

## 2019-12-13 MED ORDER — BUPIVACAINE LIPOSOME 1.3 % IJ SUSP
INTRAMUSCULAR | Status: AC
  Filled 2019-12-13: qty 20

## 2019-12-13 MED ORDER — OXYCODONE HCL 5 MG/5ML PO SOLN: 5.0000 mg | Freq: Once | ORAL | Status: DC | PRN

## 2019-12-13 MED ORDER — LIDOCAINE HCL (CARDIAC) PF 100 MG/5ML IV SOSY
PREFILLED_SYRINGE | INTRAVENOUS | Status: DC | PRN
  Administered 2019-12-13: 100 mg via INTRAVENOUS

## 2019-12-13 MED ORDER — SODIUM CHLORIDE 0.9 % IV SOLN: INTRAVENOUS | Status: DC

## 2019-12-13 MED ORDER — FENTANYL CITRATE (PF) 100 MCG/2ML IJ SOLN
25.0000 ug | INTRAMUSCULAR | Status: DC | PRN
  Administered 2019-12-13: 25 ug via INTRAVENOUS

## 2019-12-13 MED ORDER — PHENYLEPHRINE HCL (PRESSORS) 10 MG/ML IV SOLN
INTRAVENOUS | Status: DC | PRN
  Administered 2019-12-13 (×2): 100 ug via INTRAVENOUS

## 2019-12-13 MED ORDER — PROMETHAZINE HCL 25 MG/ML IJ SOLN: 6.2500 mg | INTRAMUSCULAR | Status: DC | PRN

## 2019-12-13 MED ORDER — PROPOFOL 10 MG/ML IV BOLUS
INTRAVENOUS | Status: DC | PRN
  Administered 2019-12-13: 120 mg via INTRAVENOUS

## 2019-12-13 MED ORDER — FENTANYL CITRATE (PF) 100 MCG/2ML IJ SOLN
INTRAMUSCULAR | Status: DC | PRN
  Administered 2019-12-13: 100 ug via INTRAVENOUS

## 2019-12-13 MED ORDER — DIPHENHYDRAMINE HCL 50 MG/ML IJ SOLN: 12.5000 mg | Freq: Four times a day (QID) | INTRAMUSCULAR | Status: DC | PRN

## 2019-12-13 MED ORDER — BUPIVACAINE HCL (PF) 0.5 % IJ SOLN
INTRAMUSCULAR | Status: AC
  Filled 2019-12-13: qty 30

## 2019-12-13 MED ORDER — SUCCINYLCHOLINE CHLORIDE 20 MG/ML IJ SOLN
INTRAMUSCULAR | Status: AC
  Filled 2019-12-13: qty 1

## 2019-12-13 MED ORDER — MORPHINE SULFATE (PF) 2 MG/ML IV SOLN
2.0000 mg | INTRAVENOUS | Status: DC | PRN
  Administered 2019-12-13 (×2): 4 mg via INTRAVENOUS
  Administered 2019-12-13: 2 mg via INTRAVENOUS
  Administered 2019-12-14 – 2019-12-15 (×4): 4 mg via INTRAVENOUS
  Filled 2019-12-13: qty 1
  Filled 2019-12-13 (×6): qty 2

## 2019-12-13 MED ORDER — ACETAMINOPHEN 10 MG/ML IV SOLN
INTRAVENOUS | Status: AC
  Filled 2019-12-13: qty 200

## 2019-12-13 MED ORDER — PROPOFOL 10 MG/ML IV BOLUS
INTRAVENOUS | Status: AC
  Filled 2019-12-13: qty 20

## 2019-12-13 MED ORDER — OXYBUTYNIN CHLORIDE 5 MG PO TABS: 5.0000 mg | ORAL_TABLET | Freq: Three times a day (TID) | ORAL | Status: DC | PRN

## 2019-12-13 MED ORDER — ROCURONIUM BROMIDE 100 MG/10ML IV SOLN
INTRAVENOUS | Status: DC | PRN
  Administered 2019-12-13: 5 mg via INTRAVENOUS
  Administered 2019-12-13: 45 mg via INTRAVENOUS

## 2019-12-13 MED ORDER — CEFAZOLIN SODIUM-DEXTROSE 1-4 GM/50ML-% IV SOLN
1.0000 g | Freq: Three times a day (TID) | INTRAVENOUS | Status: DC
  Administered 2019-12-13 – 2019-12-14 (×5): 1 g via INTRAVENOUS
  Filled 2019-12-13 (×10): qty 50

## 2019-12-13 MED ORDER — ACETAMINOPHEN 10 MG/ML IV SOLN
INTRAVENOUS | Status: DC | PRN
  Administered 2019-12-13: 1000 mg via INTRAVENOUS

## 2019-12-13 MED ORDER — ONDANSETRON HCL 4 MG/2ML IJ SOLN
INTRAMUSCULAR | Status: DC | PRN
  Administered 2019-12-13: 4 mg via INTRAVENOUS

## 2019-12-13 MED ORDER — HEPARIN SODIUM (PORCINE) 5000 UNIT/ML IJ SOLN
5000.0000 [IU] | Freq: Three times a day (TID) | INTRAMUSCULAR | Status: DC
  Administered 2019-12-13 – 2019-12-15 (×6): 5000 [IU] via SUBCUTANEOUS
  Filled 2019-12-13 (×6): qty 1

## 2019-12-13 MED ORDER — CEFAZOLIN SODIUM-DEXTROSE 2-4 GM/100ML-% IV SOLN
INTRAVENOUS | Status: AC
  Filled 2019-12-13: qty 100

## 2019-12-13 MED ORDER — DOCUSATE SODIUM 100 MG PO CAPS
100.0000 mg | ORAL_CAPSULE | Freq: Two times a day (BID) | ORAL | Status: DC
  Administered 2019-12-13 – 2019-12-15 (×4): 100 mg via ORAL
  Filled 2019-12-13 (×4): qty 1

## 2019-12-13 MED ORDER — SUCCINYLCHOLINE CHLORIDE 20 MG/ML IJ SOLN
INTRAMUSCULAR | Status: DC | PRN
  Administered 2019-12-13: 80 mg via INTRAVENOUS

## 2019-12-13 MED ORDER — BUPIVACAINE HCL (PF) 0.5 % IJ SOLN
INTRAMUSCULAR | Status: DC | PRN
  Administered 2019-12-13: 30 mL

## 2019-12-13 MED ORDER — FAMOTIDINE 20 MG PO TABS
ORAL_TABLET | ORAL | Status: AC
  Administered 2019-12-13: 07:00:00 20 mg via ORAL
  Filled 2019-12-13: qty 1

## 2019-12-13 MED ORDER — CHLORHEXIDINE GLUCONATE CLOTH 2 % EX PADS
6.0000 | MEDICATED_PAD | Freq: Every day | CUTANEOUS | Status: DC
  Administered 2019-12-14: 6 via TOPICAL

## 2019-12-13 MED ORDER — THROMBIN 5000 UNITS EX SOLR
CUTANEOUS | Status: DC | PRN
  Administered 2019-12-13: 5000 [IU] via TOPICAL

## 2019-12-13 MED ORDER — SODIUM CHLORIDE 0.9 % IV SOLN
INTRAVENOUS | Status: DC | PRN
  Administered 2019-12-13: 40 ug/min via INTRAVENOUS

## 2019-12-13 MED ORDER — SUGAMMADEX SODIUM 200 MG/2ML IV SOLN
INTRAVENOUS | Status: AC
  Filled 2019-12-13: qty 4

## 2019-12-13 MED ORDER — PHENYLEPHRINE HCL (PRESSORS) 10 MG/ML IV SOLN
INTRAVENOUS | Status: AC
  Filled 2019-12-13: qty 1

## 2019-12-13 MED ORDER — BELLADONNA ALKALOIDS-OPIUM 16.2-60 MG RE SUPP: 1.0000 | Freq: Four times a day (QID) | RECTAL | Status: DC | PRN

## 2019-12-13 MED ORDER — ONDANSETRON HCL 4 MG/2ML IJ SOLN
INTRAMUSCULAR | Status: AC
  Filled 2019-12-13: qty 2

## 2019-12-13 MED ORDER — OXYCODONE HCL 5 MG PO TABS: 5.0000 mg | ORAL_TABLET | Freq: Once | ORAL | Status: DC | PRN

## 2019-12-13 MED ORDER — ONDANSETRON HCL 4 MG/2ML IJ SOLN: 4.0000 mg | INTRAMUSCULAR | Status: DC | PRN

## 2019-12-13 MED ORDER — FAMOTIDINE 20 MG PO TABS: 20.0000 mg | ORAL_TABLET | Freq: Once | ORAL | Status: AC

## 2019-12-13 MED ORDER — FENTANYL CITRATE (PF) 100 MCG/2ML IJ SOLN
INTRAMUSCULAR | Status: AC
  Filled 2019-12-13: qty 2

## 2019-12-13 MED ORDER — ROCURONIUM BROMIDE 50 MG/5ML IV SOLN
INTRAVENOUS | Status: AC
  Filled 2019-12-13: qty 1

## 2019-12-13 MED ORDER — LIDOCAINE HCL (PF) 2 % IJ SOLN
INTRAMUSCULAR | Status: AC
  Filled 2019-12-13: qty 5

## 2019-12-13 MED ORDER — THROMBIN 5000 UNITS EX SOLR
CUTANEOUS | Status: AC
  Filled 2019-12-13: qty 5000

## 2019-12-13 MED ORDER — CEFAZOLIN SODIUM-DEXTROSE 2-4 GM/100ML-% IV SOLN
2.0000 g | INTRAVENOUS | Status: AC
  Administered 2019-12-13: 2 g via INTRAVENOUS

## 2019-12-13 MED ORDER — SODIUM CHLORIDE 0.9% IV SOLUTION: Freq: Once | INTRAVENOUS | Status: DC

## 2019-12-13 MED ORDER — OXYCODONE-ACETAMINOPHEN 5-325 MG PO TABS
1.0000 | ORAL_TABLET | ORAL | Status: DC | PRN
  Administered 2019-12-14: 2 via ORAL
  Administered 2019-12-14: 1 via ORAL
  Filled 2019-12-13: qty 2
  Filled 2019-12-13: qty 1

## 2019-12-13 MED ORDER — BUPIVACAINE LIPOSOME 1.3 % IJ SUSP
INTRAMUSCULAR | Status: DC | PRN
  Administered 2019-12-13: 20 mL

## 2019-12-13 MED ORDER — MEPERIDINE HCL 50 MG/ML IJ SOLN: 6.2500 mg | INTRAMUSCULAR | Status: DC | PRN

## 2019-12-13 MED ORDER — SUGAMMADEX SODIUM 200 MG/2ML IV SOLN
INTRAVENOUS | Status: DC | PRN
  Administered 2019-12-13: 200 mg via INTRAVENOUS

## 2019-12-13 SURGICAL SUPPLY — 93 items
ANCHOR TIS RET SYS 1550ML (BAG) ×3 IMPLANT
APPLICATOR SURGIFLO ENDO (HEMOSTASIS) ×3 IMPLANT
APPLIER CLIP LOGIC TI 5 (MISCELLANEOUS) IMPLANT
BULB RESERV EVAC DRAIN JP 100C (MISCELLANEOUS) IMPLANT
CANISTER SUCT 1200ML W/VALVE (MISCELLANEOUS) ×3 IMPLANT
CANNULA SEALS 8.5MM (CANNULA) ×8
CHLORAPREP W/TINT 26 (MISCELLANEOUS) ×6 IMPLANT
CLIP SUT LAPRA TY ABSORB (SUTURE) ×3 IMPLANT
CLIP VESOLOCK LG 6/CT PURPLE (CLIP) ×12 IMPLANT
CLIP VESOLOCK MED LG 6/CT (CLIP) IMPLANT
CORD BIP STRL DISP 12FT (MISCELLANEOUS) ×3 IMPLANT
CORD MONOPOLAR M/FML 12FT (MISCELLANEOUS) ×3 IMPLANT
COVER TIP SHEARS 8 DVNC (MISCELLANEOUS) ×1 IMPLANT
COVER TIP SHEARS 8MM DA VINCI (MISCELLANEOUS) ×2
COVER WAND RF STERILE (DRAPES) ×3 IMPLANT
CUTTER ECHEON FLEX ENDO 45 340 (ENDOMECHANICALS) IMPLANT
DEFOGGER SCOPE WARMER CLEARIFY (MISCELLANEOUS) ×3 IMPLANT
DERMABOND ADVANCED (GAUZE/BANDAGES/DRESSINGS) ×2
DERMABOND ADVANCED .7 DNX12 (GAUZE/BANDAGES/DRESSINGS) ×1 IMPLANT
DRAIN CHANNEL JP 19F (MISCELLANEOUS) IMPLANT
DRAPE 3/4 80X56 (DRAPES) ×3 IMPLANT
DRAPE ARM DVNC X/XI (DISPOSABLE) ×4 IMPLANT
DRAPE COLUMN DVNC XI (DISPOSABLE) ×1 IMPLANT
DRAPE DA VINCI XI ARM (DISPOSABLE) ×8
DRAPE DA VINCI XI COLUMN (DISPOSABLE) ×2
DRAPE SURG 17X11 SM STRL (DRAPES) ×12 IMPLANT
ELECT REM PT RETURN 9FT ADLT (ELECTROSURGICAL) ×3
ELECTRODE REM PT RTRN 9FT ADLT (ELECTROSURGICAL) ×1 IMPLANT
GLOVE BIO SURGEON STRL SZ 6.5 (GLOVE) ×4 IMPLANT
GLOVE BIO SURGEONS STRL SZ 6.5 (GLOVE) ×2
GOWN STRL REUS W/ TWL LRG LVL3 (GOWN DISPOSABLE) ×6 IMPLANT
GOWN STRL REUS W/TWL LRG LVL3 (GOWN DISPOSABLE) ×12
GOWN STRL REUS W/TWL XL LVL4 (GOWN DISPOSABLE) ×3 IMPLANT
GRASPER SUT TROCAR 14GX15 (MISCELLANEOUS) ×3 IMPLANT
HEMOSTAT SURGICEL 2X14 (HEMOSTASIS) ×3 IMPLANT
HOLDER FOLEY CATH W/STRAP (MISCELLANEOUS) ×3 IMPLANT
IRRIGATION STRYKERFLOW (MISCELLANEOUS) ×1 IMPLANT
IRRIGATOR STRYKERFLOW (MISCELLANEOUS) ×3
IV NS 1000ML (IV SOLUTION) ×4
IV NS 1000ML BAXH (IV SOLUTION) ×2 IMPLANT
KIT PINK PAD W/HEAD ARE REST (MISCELLANEOUS) ×3
KIT PINK PAD W/HEAD ARM REST (MISCELLANEOUS) ×1 IMPLANT
KIT TURNOVER CYSTO (KITS) ×3 IMPLANT
LABEL OR SOLS (LABEL) ×3 IMPLANT
LOOP RED MAXI  1X406MM (MISCELLANEOUS) ×2
LOOP VESSEL MAXI 1X406 RED (MISCELLANEOUS) ×1 IMPLANT
NEEDLE HYPO 22GX1.5 SAFETY (NEEDLE) ×3 IMPLANT
NEEDLE HYPO 25X1 1.5 SAFETY (NEEDLE) ×3 IMPLANT
NEEDLE INSUFFLATION 14GA 120MM (NEEDLE) ×3 IMPLANT
NS IRRIG 500ML POUR BTL (IV SOLUTION) ×3 IMPLANT
OBTURATOR OPTICAL STANDARD 8MM (TROCAR) ×2
OBTURATOR OPTICAL STND 8 DVNC (TROCAR) ×1
OBTURATOR OPTICALSTD 8 DVNC (TROCAR) ×1 IMPLANT
PACK LAP CHOLECYSTECTOMY (MISCELLANEOUS) ×3 IMPLANT
PENCIL ELECTRO HAND CTR (MISCELLANEOUS) ×3 IMPLANT
RELOAD STAPLER 2.5X60 WHT DVNC (STAPLE) ×2 IMPLANT
RELOAD STAPLER WHITE 60MM (STAPLE) ×3 IMPLANT
RELOAD WH ECHELON 45 (STAPLE) IMPLANT
SEAL CANN 8.5 DVNC (CANNULA) ×4 IMPLANT
SEAL CANN UNIV 5-8 DVNC XI (MISCELLANEOUS) ×4 IMPLANT
SEAL XI 5MM-8MM UNIVERSAL (MISCELLANEOUS) ×8
SET TUBE SMOKE EVAC HIGH FLOW (TUBING) ×3 IMPLANT
SLEEVE ENDOPATH XCEL 5M (ENDOMECHANICALS) IMPLANT
SOL .9 NS 3000ML IRR  AL (IV SOLUTION) ×2
SOL .9 NS 3000ML IRR UROMATIC (IV SOLUTION) ×1 IMPLANT
SOLUTION ELECTROLUBE (MISCELLANEOUS) ×3 IMPLANT
SPOGE SURGIFLO 8M (HEMOSTASIS) ×2
SPONGE LAP 4X18 RFD (DISPOSABLE) ×3 IMPLANT
SPONGE SURGIFLO 8M (HEMOSTASIS) ×1 IMPLANT
SPONGE VERSALON 4X4 4PLY (MISCELLANEOUS) IMPLANT
STAPLE ECHEON FLEX 60 POW ENDO (STAPLE) ×3 IMPLANT
STAPLER 60 DA VINCI SURE FORM (STAPLE) ×2
STAPLER 60 SUREFORM DVNC (STAPLE) ×1 IMPLANT
STAPLER RELOAD 2.5X60 WHITE (STAPLE) ×4
STAPLER RELOAD 2.5X60 WHT DVNC (STAPLE) ×2
STAPLER RELOAD WHITE 60MM (STAPLE) ×9
STAPLER SKIN PROX 35W (STAPLE) ×3 IMPLANT
SUT DVC VLOC 90 3-0 CV23 UNDY (SUTURE) IMPLANT
SUT ETHILON 3-0 FS-10 30 BLK (SUTURE)
SUT MNCRL AB 4-0 PS2 18 (SUTURE) ×3 IMPLANT
SUT PROLENE 3 0 CT 1 (SUTURE) ×3 IMPLANT
SUT PROLENE 5 0 RB 1 DA (SUTURE) ×6 IMPLANT
SUT VIC AB 0 CT1 36 (SUTURE) ×6 IMPLANT
SUT VIC AB 4-0 RB1 27 (SUTURE)
SUT VIC AB 4-0 RB1 27X BRD (SUTURE) IMPLANT
SUT VICRYL 0 AB UR-6 (SUTURE) ×6 IMPLANT
SUT VICRYL PLUS ABS 0 54 (SUTURE) ×3 IMPLANT
SUTURE EHLN 3-0 FS-10 30 BLK (SUTURE) IMPLANT
TAPE CLOTH 3X10 WHT NS LF (GAUZE/BANDAGES/DRESSINGS) ×6 IMPLANT
TRAY FOLEY MTR SLVR 16FR STAT (SET/KITS/TRAYS/PACK) ×3 IMPLANT
TROCAR 12M 150ML BLUNT (TROCAR) IMPLANT
TROCAR XCEL 12X100 BLDLESS (ENDOMECHANICALS) ×6 IMPLANT
TROCAR XCEL NON-BLD 5MMX100MML (ENDOMECHANICALS) ×3 IMPLANT

## 2019-12-13 NOTE — Anesthesia Postprocedure Evaluation (Signed)
Anesthesia Post Note  Patient: Troy [name]  Procedure(s) Performed: XI ROBOTIC ASSISTED LAPAROSCOPIC NEPHRECTOMY (Right )  Patient location during evaluation: PACU Anesthesia Type: General Level of consciousness: awake and alert and oriented Pain management: pain level controlled Vital Signs Assessment: post-procedure vital signs reviewed and stable Respiratory status: spontaneous breathing, nonlabored ventilation and respiratory function stable Cardiovascular status: blood pressure returned to baseline and stable Postop Assessment: no signs of nausea or vomiting Anesthetic complications: no     Last Vitals:  Vitals:   12/13/19 1231 12/13/19 1300  BP:  123/73  Pulse: 73 70  Resp: 20   Temp: 36.7 C 36.4 C  SpO2: 95% 93%    Last Pain:  Vitals:   12/13/19 1300  TempSrc: Oral  PainSc: Asleep                 Champ Keetch

## 2019-12-13 NOTE — Anesthesia Procedure Notes (Signed)
Procedure Name: Intubation Date/Time: 12/13/2019 8:44 AM Performed by: Hedda Slade, CRNA Pre-anesthesia Checklist: Patient identified, Patient being monitored, Timeout performed, Emergency Drugs available and Suction available Patient Re-evaluated:Patient Re-evaluated prior to induction Oxygen Delivery Method: Circle system utilized Preoxygenation: Pre-oxygenation with 100% oxygen Induction Type: IV induction Ventilation: Mask ventilation without difficulty and Oral airway inserted - appropriate to patient size Laryngoscope Size: McGraph and 4 Grade View: Grade I Tube type: Oral Tube size: 7.5 mm Number of attempts: 1 Airway Equipment and Method: Stylet Placement Confirmation: ETT inserted through vocal cords under direct vision,  positive ETCO2 and breath sounds checked- equal and bilateral Secured at: 22 cm Tube secured with: Tape Dental Injury: Teeth and Oropharynx as per pre-operative assessment  Comments: Patient with blister/scabs to right lower lip, bleeding prior to intubation

## 2019-12-13 NOTE — H&P (Signed)
H&P updated 12/13/2019.  Regular rate and rhythm.  Clear to auscultation bilaterally. Status post cardiac clearance for today  Troy [name]  06/11/1951  [id number]  Referring provider: [name], MD   [place], [place] [address]  No chief complaint on file.   HPI:  69 year old male with biopsy-proven right renal cell carcinoma who presents today to discuss operative management of his large tumor. 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 Donella Stade Dr. Janese Banks. He has completed radiation to the chest and has 1 more dose of chemotherapy. He has tolerated this reasonably well.   As part of his staging work-up for this, he underwent a PET scan which showed an incidental large right renal mass. Most recently for further staging, he underwent an MRI of the abdomen with and without contrast revealing a 10.9 cm right lower pole renal mass which is a heterogeneously enhancing without pathologic adenopathy or renal vein involvement.   Given his history of multimalignancies, he did undergo biopsy of this which was indicative of renal cell carcinoma, nuclear grade 2.  Most recent staging imaging in the form of CT abdomen pelvis with contrast on 10/14/2019 shows no significant interval growth of his renal mass, lesion now measures 9.2 x 9.2 cm.. Radiation changes of the lungs are appreciated.   He continues to smoke.   He does have extensive cardiac history on aspirin and Plavix. He will need clearance for  this.   No previous abdominal surgeries.  PMH:      Past Medical History:  Diagnosis Date  . Anxiety   . COPD (chronic obstructive pulmonary disease) ([place])   . Coronary artery disease 2006   Acute coronary syndrome in March 2006.   . Diabetes mellitus without complication ([place])   . Dyslipidemia   . Hypertension 10/09/2010   EF 40-45%  . Lung cancer ([place])   . 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 Sleep apnea    uses cpap machine   Surgical History:       Past Surgical History:  Procedure Laterality Date  . Arterial evalation      no evidence of ICA stenosis  . CARDIAC CATHETERIZATION    . CARDIAC SURGERY    . CORONARY ARTERY BYPASS GRAFT  2006   Post CABG surgery with a LIMA to the LAD, a vein to the diagonal ,,a vein to the the obtuse marginal and vein to the PDA.  [name] CORONARY STENT PLACEMENT  2010  . EAR CYST EXCISION Right 10/05/2018   Procedure: Right Partial pinnectomy; Surgeon: Izora Gala, MD; Location: [facility]; Service: ENT; Laterality: Right;  . GRAFT(S) ANGIOGRAM  10/21/2011   Procedure: GRAFT(S) Cyril Loosen; Surgeon: Leonie Man, MD; Location: [facility] CATH LAB; Service: Cardiovascular;;  . LEFT HEART CATHETERIZATION WITH CORONARY ANGIOGRAM N/A 10/21/2011   Procedure: LEFT HEART CATHETERIZATION WITH CORONARY ANGIOGRAM; Surgeon: Leonie Man, MD; Location: [facility] C[facility] LAB; Service: Cardiovascular; Laterality: N/A;  . LYMPH NODE BIOPSY Right 10/05/2018   Procedure: sentinel NODE BIOPSY with  right partial modified neck dissection; Surgeon: Izora Gala, MD; Location: [facility]; Service: ENT; Laterality: Right;  . PORTA CATH INSERTION N/A 07/12/2019   Procedure: PORTA CATH INSERTION; Surgeon: Algernon Huxley, MD; Location: [facility] CV LAB; Service: Cardiovascular; Laterality: N/A;   Home Medications:       Allergies as of 10/26/2019      Reactions   Codeine Nausea And Vomiting, Other (See Comments)   Pt states when he takes codeine, he throws up blood   Niacin And  Related Other (See Comments)   "made his body feel hot & he had a hard time breathing"           Medication List       Accurate as of October 26, 2019 11:59 PM. If you have any questions, ask your nurse or doctor.        ALPRAZolam 0.25 MG tablet  Commonly known as: XANAX  Take 0.25 mg by mouth 3 (three) times daily.   amLODipine 10 MG tablet  Commonly known as: NORVASC  Take 10 mg by mouth daily.   aspirin EC 81 MG tablet  Take 81 mg by mouth daily.   atorvastatin 40 MG tablet  Commonly known as: LIPITOR  TAKE 1 TABLET (40 MG TOTAL) BY MOUTH AT BEDTIME. KEEP OCTOBER APPOINTMENT   Baclofen 5 MG Tabs  Take 5 mg by mouth 3 (three) times daily. If 5 mg tid is not working then can increase to 10 mg tid   busPIRone 7.5 MG tablet  Commonly known as: BUSPAR  Take 1 tablet by mouth 2 (two) times daily.   Combivent Respimat 20-100 MCG/ACT Aers respimat  Generic drug: Ipratropium-Albuterol  Inhale 1 puff into the lungs every 6 (six) hours.   dexamethasone 4 MG tablet  Commonly known as: DECADRON  Take 2 tablets (8 mg total) by mouth daily. Start the day after chemotherapy for 1 day.   doxycycline 100 MG tablet  Commonly known as: VIBRA-TABS  Take 1 tablet (100 mg total) by mouth 2 (two) times daily.   ezetimibe 10 MG tablet  Commonly known as: ZETIA  TAKE 1 TABLET BY MOUTH EVERY DAY   famotidine 20 MG tablet  Commonly known as: PEPCID  TAKE 1 TABLET (20 MG TOTAL) BY MOUTH DAILY. NEEDS APPOINTMENT FOR FUTURE REFILLS   Incruse Ellipta 62.5 MCG/INH Aepb  Generic drug: umeclidinium bromide  Inhale 1 puff into the lungs daily.   isosorbide mononitrate 60 MG 24 hr tablet  Commonly known as: IMDUR  TAKE 1 TABLET BY MOUTH EVERY DAY   lidocaine-prilocaine cream  Commonly known as: EMLA  Apply to affected area once   LORazepam 0.5 MG tablet  Commonly known as: ATIVAN  Take 1 tablet 30 minutes prior to each scan   losartan-hydrochlorothiazide 100-12.5 MG tablet  Commonly known  as: HYZAAR  Take 1 tablet by mouth daily.   magic mouthwash w/lidocaine Soln  Take 5 mLs by mouth 4 (four) times daily as needed for mouth pain.   metFORMIN 500 MG tablet  Commonly known as: GLUCOPHAGE  TAKE 1 TABLET (500 MG TOTAL) BY MOUTH 2 (TWO) TIMES DAILY WITH A MEAL.   nitroGLYCERIN 0.4 MG SL tablet  Commonly known as: NITROSTAT  Place 0.4 mg under the tongue every 5 (five) minutes as needed for chest pain.   nystatin 100000 UNIT/ML suspension  Commonly known as: MYCOSTATIN  Take 5 mLs (500,000 Units total) by mouth 4 (four) times daily.   ondansetron 8 MG  tablet  Commonly known as: Zofran  Take 1 tablet (8 mg total) by mouth 2 (two) times daily as needed for refractory nausea / vomiting. Start on day 3 after carboplatin chemo.   predniSONE 10 MG tablet  Commonly known as: DELTASONE  Take 1 tablet (10 mg total) by mouth as directed. Take 5 pills daily x 3 days, then 4 pills daily x 3 days, then 3 pills daily x 3 days, then 2 pills daily x 3 days, then 1 pill daily x 3 days. Always take med with food   prochlorperazine 10 MG tablet  Commonly known as: COMPAZINE  Take 1 tablet (10 mg total) by mouth every 6 (six) hours as needed (Nausea or vomiting).   ranolazine 1000 MG SR tablet  Commonly known as: RANEXA  TAKE 1 TABLET BY MOUTH TWICE A DAY   traMADol 50 MG tablet  Commonly known as: ULTRAM  Take 1 tablet (50 mg total) by mouth every 6 (six) hours as needed.       Allergies:       Allergies  Allergen Reactions  . Codeine Nausea And Vomiting and Other (See Comments)    Pt states when he takes codeine, he throws up blood  . Niacin And Related Other (See Comments)    "made his body feel hot & he had a hard time breathing"   Family History:       Family History  Problem Relation Age of Onset  . Hypertension Mother   . Heart attack Mother   . Heart attack Father 60   Social History: reports that he has been smoking cigarettes. He has a 100.00 pack-year smoking  history. He quit smokeless tobacco use about 6 years ago. His smokeless tobacco use included chew. He reports previous drug use. He reports that he does not drink alcohol.   Physical Exam:  BP (!) 162/88  Pulse 66  Ht 5\' 8"  (1.727 m)  Wt 175 lb (79.4 kg)  BMI 26.61 kg/m  Constitutional: Alert and oriented, No acute distress. Smells of tobacco.  HEENT: [place] AT, moist mucus membranes. Trachea midline, no masses. Right ear partially surgically absent.  Cardiovascular: No clubbing, cyanosis, or edema.  Respiratory: Normal respiratory effort, no increased work of breathing.  Abdomen: No abdominal scarring.  Skin: No rashes, bruises or suspicious lesions.  Neurologic: Grossly intact, no focal deficits, moving all 4 extremities.  Psychiatric: Normal mood and affect.         Results for orders placed or performed in visit on 10/26/19  Microscopic Examination   URINE  Result Value Ref Range   WBC, UA 0-5 0 - 5 /hpf   RBC None seen 0 - 2 /hpf   Epithelial Cells (non renal) 0-10 0 - 10 /hpf   Bacteria, UA None seen None seen/Few  Urinalysis, Complete  Result Value Ref Range   Specific Gravity, UA 1.025 1.005 - 1.030   pH, UA 5.5 5.0 - 7.5   Color, UA Yellow Yellow   Appearance Ur Clear Clear   Leukocytes,UA Negative Negative   Protein,UA Negative Negative/Trace   Glucose, UA Negative Negative   Ketones, UA Negative Negative   RBC, UA Negative Negative   Bilirubin, UA Negative Negative   Urobilinogen, Ur 0.2 0.2 - 1.0 mg/dL   Nitrite, UA Negative Negative   Microscopic Examination See below:    Pertinent Imaging:  CT abdomen pelvis with contrast from 10/14/2019 was personally reviewed  Agree with radiologic interpretation. Mass is very central  in location with what appears to be 2 calcified renal arteries and a single vein. There is also an upper pole simple renal cyst. No obvious adenopathy.   Assessment & Plan:   1. Renal cell carcinoma of right kidney (HCC)  Biopsy-proven  renal cell carcinoma, large and central without obvious metastatic disease  As per discussion with Dr. Janese Banks again today, he has now had his squamous cell of the lung adequately treated. He will be appropriate for consideration of surgical resection of his renal cell carcinoma in about 4 to 6 weeks after he recovers from his last dose of chemotherapy allowing for his blood counts to return to baseline.  As per previous discussion, discussed options including nephrectomy versus observation. He does have multiple medical comorbidities this observation would be reasonable especially given the stability, however the lesion is relatively large and central and likely will ultimately metastasized. Ultimately, he would like to pursue surgical intervention in the form of right radical nephrectomy. We will plan for robotic approach.  Risk and benefits of surgery were discussed in detail good risk of bleeding, damage surrounding structures, ileus/hernia, discomfort along with other more complex including heart attack, stroke, DVT, PE amongst others. All questions answered.  Postoperative course was discussed.  He will need cardiac clearance.  - Urinalysis, Complete  Hollice Espy, MD  [facility]  [address] [place]  [place], [place] [address]  [phone]

## 2019-12-13 NOTE — Op Note (Signed)
12/13/19  PREOPERATIVE DIAGNOSIS: Right renal mass  POSTOPERATIVE DIAGNOSIS:  1. Right renal mass.  OPERATION PERFORMED: 1. Robotic assisted laparoscopic right nephrectomy.  SURGEON: Hollice Espy, MD   Assistant:  Marcha Solders, MD  SPECIMENS: 1. Right kidney  BLOOD LOSS: 50 cc  Drains: 1. 16 Fr Foley   INDICATIONS FOR OPERATION: Right renal mass   DESCRIPTION OF OPERATION: Informed consent was obtained. The patient was marked on the right side and then taken to the operating room, placed supine on the operating table. General anesthesia was provided. SCDs were provided for DVT prophylaxis and IV antibiotics for bacterial prophylaxis. Foley catheter was placed to drain the bladder. OG tube was placed by anesthesia. The patient was then positioned in left lateral decubitus position with the right flank elevated about 70 degrees. All pressure points were carefully padded. The table was flexed slightly. The right arm was placed in a padded support. The patient was secured to the table with soft chest, hip, and lower extremity straps. The patient was prepped and draped in the usual sterile fashion. We had a time-out confirming the patient identification, the planned procedure, the surgical site, and all present were in agreement.   A Veress needle was placed just lateral to the right rectus belly at the level of the umbilicus. Aspiration, irrigation, and saline drop test confirmed good position. Opening pressure was less than 9 mmHg. The abdomen was insufflated to 15 mmHg. Following this, trocar sites were mapped out with two robotic trocars triangulating the expected location of the tumor, an 8 mm trocar between, at about the midclavicular line, for the camera, a 12 mm midline trocar for the assistant just above the umbilicus as well as a 5 mm just below the xiphoid, and an 83mm trocar in the right lower quadrant for the 4th arm.  I used a visual obturator to place a  5 mm trocar at one of the robotic port sites, and the peritoneal cavity was surveyed.  No injuries or significant adhesions were appreciated.  The remainder of the trochars were placed under direct visualization without difficulty.  The robot was docked. I placed monopolar shears in the right arm, bipolar in the left, and a tip up grasper in the 4th arm.   The white line of Toldt was incised and the right colon was Reflected.  This plan was a bit distorted due to the immense size of the lower pole tumor.  For the majority of the case, a locking grasper was used through the 5 mm assistant port to elevate the liver after it was mobilized and retracted superiorly.  Due to the distortion from tumor, I was unable to immediately identify the gonadal vein and ureter due to compression from the tumor thus I went straight to the hilum.  I was able to identify the duodenum which was kocherized medially exposing the vena cava.  The renal vein was identified and a dissection was performed both superior and inferior circumferentially around the renal artery and vein.  The adrenal gland was also identified adjacent to an upper pole medial cyst and a plane was created between the 2 in order to spare the adrenal gland.  In this dissection, the cyst was inadvertently ruptured.  Ultimately, I was able to pass my instrument underneath and around the hilum.  The hilum was then taken en bloc using a 60 mm vascular load stapler.  Hemostasis was quite good.  Additional blood clip was applied on the distalmost aspect of the  staple line for additional hemostasis.  The dissection was carried then up and around the medial upper pole where another staple load was fired.  Next, the dissection was carried down medially inferiorly towards the tail of Gerota's.  The gonadal vein was identified at its takeoff from the IVC.  In order to mobilize the kidney, this was ligated using Weck clips x2 on the stay side.  Ultimately, the entire kidney  was able to be freed up including the posterior plane exposing the psoas muscle and all that remained was the tail which had numerous parasitic vessels as well as the gonadal vein and ureter.  Due to the size and number of the parasitic vessels, I elected to take the till after it is again en bloc using a staple fire using a 60 mm vascular load.  The stump of the ureter was visible.  The lateral attachments of the kidney were finally taken down such that the entire kidney was freed.  This was placed up onto the liver and slid down the liver into a large Endo Catch sac which was closed.  This was placed out of the way.  Careful inspection of the hilum and liver bed were then performed.  No significant visible active bleeding was appreciated.  I placed Surgicel and Surgi-Flo both in the hilum and into the liver bed for additional hemostasis.  There was no bleeding elsewhere.  Finally, the midline 12 mm trocar site was extended superiorly connecting the 5 mm and 12 mm trocar sites in the midline.  The specimen was removed.  This incision was instilled with additional liposomal Marcaine.  The fascia was then closed using #1 PDS in interrupted figure of eights.  Each of the wounds were then irrigated and the skin was closed using 4 Monocryl in a subcuticular fashion.  Patient was then cleaned and dried.  Dermabond was applied to each of the wounds.  The patient was then repositioned in the supine position, reversed myesthesia taken the PACU in stable condition.  There were no complications.  All sponge, needle, and instrument counts were reported correct. The patient was awakened from anesthesia and transferred to recovery in stable condition. There were no complications and the patient tolerated the procedure well. Operative events were discussed with the patient's family.  Hollice Espy, MD

## 2019-12-13 NOTE — Transfer of Care (Signed)
Immediate Anesthesia Transfer of Care Note  Patient: Troy [name]  Procedure(s) Performed: XI ROBOTIC ASSISTED LAPAROSCOPIC NEPHRECTOMY (Right )  Patient Location: PACU  Anesthesia Type:General  Level of Consciousness: awake, alert  and oriented  Airway & Oxygen Therapy: Patient Spontanous Breathing and Patient connected to face mask oxygen  Post-op Assessment: post op vitals reviewed and stable  Post vital signs: Reviewed and stable  Last Vitals:  Vitals Value Taken Time  BP 140/74 12/13/19 1142  Temp    Pulse 78 12/13/19 1142  Resp 28 12/13/19 1142  SpO2 100 % 12/13/19 1142  Vitals shown include unvalidated device data.  Last Pain:  Vitals:   12/13/19 1142  TempSrc:   PainSc: 0-No pain         Complications: No apparent anesthesia complications

## 2019-12-13 NOTE — Anesthesia Preprocedure Evaluation (Addendum)
Anesthesia Evaluation  Patient identified by MRN, date of birth, ID band Patient awake    Reviewed: Allergy & Precautions, NPO status , Patient's Chart, lab work & pertinent test results  History of Anesthesia Complications Negative for: history of anesthetic complications  Airway Mallampati: II  TM Distance: >3 FB Neck ROM: Full    Dental  (+) Edentulous Upper, Edentulous Lower   Pulmonary sleep apnea and Continuous Positive Airway Pressure Ventilation , COPD, Current Smoker and Patient abstained from smoking.,    breath sounds clear to auscultation- rhonchi (-) wheezing      Cardiovascular hypertension, (-) angina+ CAD, + Past MI, + Cardiac Stents (last stent 2010) and + CABG (2006)   Rhythm:Regular Rate:Normal - Systolic murmurs and - Diastolic murmurs Echo 6/44/03: 1. Left ventricular ejection fraction, by visual estimation, is 50%. The  left ventricle has mildly decreased function. There is mildly increased  left ventricular hypertrophy.  2. Left ventricular diastolic parameters are consistent with Grade I  diastolic dysfunction (impaired relaxation).  3. Small left ventricular internal cavity size.  4. The left ventricle demonstrates regional wall motion abnormalities.  Apical hypokinesis.  5. Global right ventricle has normal systolic function.The right  ventricular size is normal. No increase in right ventricular wall  thickness.  6. Left atrial size was mildly dilated.  7. Right atrial size was mildly dilated.  8. Mild mitral annular calcification.  9. The mitral valve is normal in structure. No evidence of mitral valve  regurgitation. No evidence of mitral stenosis.  10. The tricuspid valve is normal in structure. Tricuspid valve  regurgitation is not demonstrated.  11. The aortic valve is tricuspid. Aortic valve regurgitation is not  visualized. Mild aortic valve sclerosis without stenosis.  12. There is  mild dilatation of the ascending aorta measuring 38 mm.  13. The inferior vena cava is normal in size with greater than 50%  respiratory variability, suggesting right atrial pressure of 3 mmHg.  14. The tricuspid regurgitant velocity is 2.25 m/s, and with an assumed  right atrial pressure of 3 mmHg, the estimated right ventricular systolic  pressure is normal at 23.2 mmHg.    Neuro/Psych neg Seizures Anxiety negative neurological ROS     GI/Hepatic negative GI ROS, Neg liver ROS,   Endo/Other  diabetes, Oral Hypoglycemic Agents  Renal/GU negative Renal ROS     Musculoskeletal negative musculoskeletal ROS (+)   Abdominal (+) - obese,   Peds  Hematology negative hematology ROS (+)   Anesthesia Other Findings Past Medical History: No date: Anxiety No date: COPD (chronic obstructive pulmonary disease) ([place]) 2006: Coronary artery disease     Comment:  Acute coronary syndrome in March 2006.  No date: Diabetes mellitus without complication ([place]) No date: Dyslipidemia 10/09/2010: Hypertension     Comment:  EF 40-45% No date: Lung cancer ([place]) 09/2018: Melanoma in situ of ear, right ([place]) No date: Myocardial infarction ([place]) No date: Sleep apnea     Comment:  uses cpap machine   Reproductive/Obstetrics                            Anesthesia Physical Anesthesia Plan  ASA: III  Anesthesia Plan: General   Post-op Pain Management:    Induction: Intravenous  PONV Risk Score and Plan: 0 and Ondansetron  Airway Management Planned: Oral ETT  Additional Equipment:   Intra-op Plan:   Post-operative Plan: Extubation in OR  Informed Consent: I have reviewed the patients  History and Physical, chart, labs and discussed the procedure including the risks, benefits and alternatives for the proposed anesthesia with the patient or authorized representative who has indicated his/her understanding and acceptance.     Dental advisory given  Plan  Discussed with: CRNA and Anesthesiologist  Anesthesia Plan Comments:         Anesthesia Quick Evaluation

## 2019-12-14 ENCOUNTER — Inpatient Hospital Stay: Payer: Medicare Other

## 2019-12-14 DIAGNOSIS — J9601 Acute respiratory failure with hypoxia: Secondary | ICD-10-CM | POA: Diagnosis not present

## 2019-12-14 DIAGNOSIS — J9622 Acute and chronic respiratory failure with hypercapnia: Secondary | ICD-10-CM | POA: Diagnosis not present

## 2019-12-14 LAB — CBC
HCT: 33.6 % — ABNORMAL LOW (ref 39.0–52.0)
Hemoglobin: 10.5 g/dL — ABNORMAL LOW (ref 13.0–17.0)
MCH: 29.9 pg (ref 26.0–34.0)
MCHC: 31.3 g/dL (ref 30.0–36.0)
MCV: 95.7 fL (ref 80.0–100.0)
Platelets: 290 10*3/uL (ref 150–400)
RBC: 3.51 MIL/uL — ABNORMAL LOW (ref 4.22–5.81)
RDW: 13.9 % (ref 11.5–15.5)
WBC: 15.5 10*3/uL — ABNORMAL HIGH (ref 4.0–10.5)
nRBC: 0 % (ref 0.0–0.2)

## 2019-12-14 LAB — BASIC METABOLIC PANEL
Anion gap: 10 (ref 5–15)
BUN: 15 mg/dL (ref 8–23)
CO2: 26 mmol/L (ref 22–32)
Calcium: 8.7 mg/dL — ABNORMAL LOW (ref 8.9–10.3)
Chloride: 102 mmol/L (ref 98–111)
Creatinine, Ser: 1.86 mg/dL — ABNORMAL HIGH (ref 0.61–1.24)
GFR calc Af Amer: 42 mL/min — ABNORMAL LOW (ref >60)
GFR calc non Af Amer: 36 mL/min — ABNORMAL LOW (ref >60)
Glucose, Bld: 143 mg/dL — ABNORMAL HIGH (ref 70–99)
Potassium: 4 mmol/L (ref 3.5–5.1)
Sodium: 138 mmol/L (ref 135–145)

## 2019-12-14 LAB — GLUCOSE, CAPILLARY: Glucose-Capillary: 143 mg/dL — ABNORMAL HIGH (ref 70–99)

## 2019-12-14 MED ORDER — NICOTINE 21 MG/24HR TD PT24
21.0000 mg | MEDICATED_PATCH | Freq: Every day | TRANSDERMAL | Status: DC
  Administered 2019-12-14 – 2019-12-15 (×2): 21 mg via TRANSDERMAL
  Filled 2019-12-14 (×2): qty 1

## 2019-12-14 MED ORDER — ALPRAZOLAM 0.25 MG PO TABS
0.2500 mg | ORAL_TABLET | Freq: Three times a day (TID) | ORAL | Status: DC | PRN
  Administered 2019-12-14 – 2019-12-15 (×2): 0.25 mg via ORAL
  Filled 2019-12-14 (×2): qty 1

## 2019-12-14 MED ORDER — IPRATROPIUM-ALBUTEROL 0.5-2.5 (3) MG/3ML IN SOLN
3.0000 mL | Freq: Four times a day (QID) | RESPIRATORY_TRACT | Status: DC
  Administered 2019-12-14 – 2019-12-15 (×4): 3 mL via RESPIRATORY_TRACT
  Filled 2019-12-14 (×4): qty 3

## 2019-12-14 NOTE — Progress Notes (Signed)
Pt stated he felt like he was having an anxiety attack, and wanted to leave. He says he gets them at home, and usually takes time to walk it off. Notified on-call urology, Dr. Bernardo Heater. Pt was given 0.25mg  of Xanax, and a fan for his room. I stayed with pt to reassure him that he would be okay.

## 2019-12-14 NOTE — Progress Notes (Addendum)
Urology Inpatient Progress Note  Subjective: Troy [name] is a 69 y.o. male admitted on 12/13/2019 for scheduled robotic assisted laparoscopic right nephrectomy with Dr. 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  He is a 2 pack/day smoker.  He uses CPAP at home and is not on supplemental oxygen. Patient hypoxic today in no apparent distress, O2 sats as follows: 92% on room air in bed 80% on room air while ambulating 90% with 2L/min while ambulating 95% with 2L/min at rest  Creatinine increased today, 1.86.  WBC increased today, 15.5.  He has not yet ambulated.  He denies nausea and vomiting.  He has passed flatus.  No BM yet.  Diet advanced this morning without incident.  He reports soreness at his midline abdominal incision.  Anti-infectives: Anti-infectives (From admission, onward)   Start     Dose/Rate Route Frequency Ordered Stop   12/13/19 1700  ceFAZolin (ANCEF) IVPB 1 g/50 mL premix     1 g 100 mL/hr over 30 Minutes Intravenous Every 8 hours 12/13/19 1304     12/13/19 0642  ceFAZolin (ANCEF) 2-4 GM/100ML-% IVPB    Note to Pharmacy: Nyra Jabs   : cabinet override      12/13/19 2778 12/13/19 0910   12/13/19 0119  ceFAZolin (ANCEF) IVPB 2g/100 mL premix     2 g 200 mL/hr over 30 Minutes Intravenous 30 min pre-op 12/13/19 0119 12/13/19 0915      Current Facility-Administered Medications  Medication Dose Route Frequency Provider Last Rate Last Admin  . 0.9 %  sodium chloride infusion (Manually program via Guardrails IV Fluids)   Intravenous Once Sindy Guadeloupe, MD      . 0.9 %  sodium chloride infusion   Intravenous Continuous Hollice Espy, MD 75 mL/hr at 12/14/19 0630 Rate Verify at 12/14/19 0630  . acetaminophen (TYLENOL) tablet 650 mg  650 mg Oral Q4H PRN Hollice Espy, MD      . ceFAZolin (ANCEF) IVPB 1 g/50 mL  premix  1 g Intravenous Q8H Hollice Espy, MD 100 mL/hr at 12/14/19 0921 1 g at 12/14/19 0921  . Chlorhexidine Gluconate Cloth 2 % PADS 6 each  6 each Topical Daily Hollice Espy, MD      . diphenhydrAMINE (BENADRYL) injection 12.5 mg  12.5 mg Intravenous Q6H PRN Hollice Espy, MD       Or  . diphenhydrAMINE (BENADRYL) 12.5 MG/5ML elixir 12.5 mg  12.5 mg Oral Q6H PRN Hollice Espy, MD      . docusate sodium (COLACE) capsule 100 mg  100 mg Oral BID Hollice Espy, MD   100 mg at 12/14/19 0919  . heparin injection 5,000 Units  5,000 Units Subcutaneous Q8H Hollice Espy, MD   5,000 Units at 12/14/19 0526  . morphine 2 MG/ML injection 2-4 mg  2-4 mg Intravenous Q2H PRN Hollice Espy, MD   4 mg at 12/14/19 0526  . ondansetron (ZOFRAN) injection 4 mg  4 mg Intravenous Q4H PRN Hollice Espy, MD      . opium-belladonna (B&O SUPPRETTES) 16.2-60 MG suppository 1 suppository  1 suppository Rectal Q6H PRN Hollice Espy, MD      . oxybutynin (DITROPAN) tablet 5 mg  5 mg Oral Q8H PRN Hollice Espy, MD      . oxyCODONE-acetaminophen (PERCOCET/ROXICET) 5-325 MG per tablet 1-2 tablet  1-2 tablet Oral Q4H PRN [name],  Caryl Pina, MD   2 tablet at 12/14/19 5361   Facility-Administered Medications Ordered in Other Encounters  Medication Dose Route Frequency Provider Last Rate Last Admin  . heparin lock flush 100 unit/mL  500 Units Intravenous Once Sindy Guadeloupe, MD      . sodium chloride flush (NS) 0.9 % injection 10 mL  10 mL Intravenous PRN Sindy Guadeloupe, MD      . sodium chloride flush (NS) 0.9 % injection 10 mL  10 mL Intravenous PRN Sindy Guadeloupe, MD   10 mL at 08/31/19 1123   Objective: Vital signs in last 24 hours: Temp:  [97.6 F (36.4 C)-98.3 F (36.8 C)] 98.1 F (36.7 C) (03/02 1221) Pulse Rate:  [70-79] 79 (03/02 1221) Resp:  [20] 20 (03/02 1221) BP: (123-162)/(73-87) 150/86 (03/02 1221) SpO2:  [86 %-96 %] 95 % (03/02 1230)  Intake/Output from previous day: 03/01 0701 - 03/02  0700 In: 2986.9 [P.O.:420; I.V.:2266.9; IV Piggyback:300] Out: 1020 [Urine:1000; Blood:20] Intake/Output this shift: Total I/O In: -  Out: 300 [Urine:300]  Physical Exam Vitals and nursing note reviewed.  Constitutional:      General: He is not in acute distress.    Appearance: Normal appearance. He is not ill-appearing, toxic-appearing or diaphoretic.  HENT:     Head: Normocephalic and atraumatic.  Pulmonary:     Effort: Pulmonary effort is normal. No respiratory distress.  Abdominal:     General: There is no distension.     Palpations: Abdomen is soft.     Tenderness: There is abdominal tenderness (Midline abdominal incision). There is no guarding or rebound.  Skin:    General: Skin is warm and dry.  Neurological:     Mental Status: He is alert and oriented to person, place, and time.  Psychiatric:        Mood and Affect: Mood normal.        Behavior: Behavior normal.    Lab Results:  Recent Labs    12/14/19 0718  WBC 15.5*  HGB 10.5*  HCT 33.6*  PLT 290   BMET Recent Labs    12/14/19 0718  NA 138  K 4.0  CL 102  CO2 26  GLUCOSE 143*  BUN 15  CREATININE 1.86*  CALCIUM 8.7*   Assessment & Plan: 69 year old male with right RCC now s/p robotic assisted laparoscopic right nephrectomy with Dr. [name].  Patient with minor complaints of incision site tenderness, typical for postoperative discomfort and with benign abdominal exam.  We will plan to keep him overnight tonight secondary to hypoxia, likely secondary to COPD and poor pulmonary hygiene following surgery.  Will order chest PT, DuoNebs, and medicine consult for further evaluation and management of this.  Elevated creatinine and WBC count reactive in the setting of right nephrectomy yesterday, repeat labs in the a.m.  We will continue to encourage ambulation and incentive spirometry.Debroah Loop, PA-C 12/14/2019

## 2019-12-14 NOTE — Consult Note (Addendum)
[facility] Medical Consultation  BRYOR RAMI CVE:[id number] DOB: November 17, 1950 DOA: 12/13/2019 PCP: [name], MD   Requesting physician: Dr. [name], urology Date of consultation: 12/14/19 Reason for consultation: hypoxia  Impression/Recommendations Active Problems:   Acute respiratory failure with hypoxia ([place])   Left nephrolithiasis  1. Acute Respiratory Failure with Hypoxia 2. History of COPD 3. History of lung cancer s/p chemo and radiation (recent) 4. OSA on CPAP with nocturnal oxygen 5. Ongoing tobacco abuse, 3 ppd 6. Chronic HFrEF, ischemic cardiomyopathy 7. CAD s/p 4v CABG and PCI  Patient's current hypoxia likely is multifactorial, given his surgery yesterday, underlying COPD, ongoing smoking, lung cancer, OSA, and possibly CHF.  Given patient has multiple malignancies, he is high risk for DVT/PE.  COPD seems stable clinically with good aeration on very minimal wheeze heard on exam.  Appears euvolemic without peripheral edema, JVD or crackles on lung exam, so unlikely hypoxia related to CHF.  Patient also having post-op pain which may contribute if he's not taking full, deep breaths.  This could also be chronic hypoxia that has previously gone undiagnosed, given patient's history.  Plan: --continue supplemental oxygen, maintain O2 sat > 90% --patient declined CPAP machine --chest xray --lower extremity doppler ultrasounds to look for DVT --avoid CTA chest in patient who is POD1 from right nephrectomy --consider V/Q scan for further evaluation tomorrow --agree with DuoNebs every 6 hour --agree with chest PT --incentive spirometer --home oxygen qualification before discharge as expect patient will require  We will followup again tomorrow. Please contact me if I can be of assistance in the meanwhile. Thank you for this consultation.  Chief Complaint: hypoxia  HPI:  69 year old male with history of COPD, recent lung cancer diagnosis status post  chemoradiation, OSA on CPAP with nocturnal oxygen, ongoing tobacco abuse (2 pack/day), chronic HFrEF/ischemic cardiomyopathy, CAD status post four-vessel CABG and PCI, biopsy-proven right renal cell carcinoma for which patient was admitted to urology service for right nephrectomy which was done on 3/1.  Today, postop day 1, patient was noted to have persistent hypoxia, so hospitalist team was consulted for evaluation and management.  Patient seen with wife at bedside today.  He denies any recent fevers or chills or respiratory symptoms prior to being admitted for surgery.  He denies chest pain or shortness of breath.  Says he uses oxygen only at night with his CPAP but has never required continuous oxygen previously.  He states he has/had 4 different types of cancer, underwent chemoradiation for lung cancer which was diagnosed just past this past September.  States he has never had any blood clots in the past.  Has not been very active recently due to his declining health status.  Denies recent illnesses or sick contacts, abdominal complaints including nausea vomiting or diarrhea.  Does report abdominal pain secondary to surgery yesterday.  Has a chronic intermittent cough that is occasionally productive, this is no worse than baseline.  Review of Systems:  All systems reviewed and negative except as per stated in HPI above.  Past Medical History:  Diagnosis Date  . Anxiety   . COPD (chronic obstructive pulmonary disease) ([place])   . Coronary artery disease 2006   Acute coronary syndrome in March 2006.   . Diabetes mellitus without complication ([place])   . Dyslipidemia   . Hypertension 10/09/2010   EF 40-45%  . Lung cancer ([place])   . 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 Myocardial infarction ([place])   . Sleep apnea  uses cpap machine   Past Surgical History:  Procedure Laterality Date  . Arterial evalation      no evidence of ICA stenosis  . CARDIAC CATHETERIZATION    . CARDIAC  SURGERY    . CORONARY ARTERY BYPASS GRAFT  2006   Post CABG surgery with a LIMA to the LAD, a vein to the diagonal ,,a vein to the the obtuse marginal and vein to the PDA.  [name] CORONARY STENT PLACEMENT  2010  . EAR CYST EXCISION Right 10/05/2018   Procedure: Right Partial pinnectomy;  Surgeon: Izora Gala, MD;  Location: [facility];  Service: ENT;  Laterality: Right;  . GRAFT(S) ANGIOGRAM  10/21/2011   Procedure: GRAFT(S) Cyril Loosen;  Surgeon: Leonie Man, MD;  Location[facility] C[facility] LAB;  Service: Cardiovascular;;  . LEFT HEART CATHETERIZATION WITH CORONARY ANGIOGRAM N/A 10/21/2011   Procedure: LEFT HEART CATHETERIZATION WITH CORONARY ANGIOGRAM;  Surgeon: Leonie Man, MD;  Location[facility] CATH LAB;  Service: Cardiovascular;  Laterality: N/A;  . LYMPH NODE BIOPSY Right 10/05/2018   Procedure: sentinel NODE BIOPSY with right partial modified neck dissection;  Surgeon: Izora Gala, MD;  Location: [facility];  Service: ENT;  Laterality: Right;  . PORTA CATH INSERTION N/A 07/12/2019   Procedure: PORTA CATH INSERTION;  Surgeon: Algernon Huxley, MD;  Location: [facility] CV LAB;  Service: Cardiovascular;  Laterality: N/A;  . ROBOT ASSISTED LAPAROSCOPIC NEPHRECTOMY Right 12/13/2019   Procedure: XI ROBOTIC ASSISTED LAPAROSCOPIC NEPHRECTOMY;  Surgeon: Hollice Espy, MD;  Location: [facility] ORS;  Service: Urology;  Laterality: Right;  . SURGERY OF LIP     SKIN CANCER   Social History:  reports that he has been smoking cigarettes. He has a 100.00 pack-year smoking history. He quit smokeless tobacco use about 6 years ago.  His smokeless tobacco use included chew. He reports previous drug use. He reports that he does not drink alcohol.  Allergies  Allergen Reactions  . Codeine Nausea And Vomiting and Other (See Comments)    Pt states when he takes codeine, he throws up blood  . Niacin And Related Other (See Comments)    "made his body feel hot & he had a hard time breathing"   Family History  Problem Relation Age of  Onset  . Hypertension Mother   . Heart attack Mother   . Heart attack Father 69    Prior to Admission medications   Medication Sig Start Date End Date Taking? Authorizing Provider  ALPRAZolam (XANAX) 0.25 MG tablet Take 0.25 mg by mouth 3 (three) times daily as needed for anxiety or sleep.    Yes [provider]  amLODipine (NORVASC) 10 MG tablet Take 5 mg by mouth daily.    Yes [provider]  aspirin EC 81 MG tablet Take 81 mg by mouth daily.   Yes [provider]  atorvastatin (LIPITOR) 40 MG tablet TAKE 1 TABLET (40 MG TOTAL) BY MOUTH AT BEDTIME. [place] Patient taking differently: Take 40 mg by mouth at bedtime.  10/19/19  Yes Troy Sine, MD  busPIRone (BUSPAR) 7.5 MG tablet Take 7.5 mg by mouth 2 (two) times daily.  09/04/14  Yes [provider]  ezetimibe (ZETIA) 10 MG tablet TAKE 1 TABLET BY MOUTH EVERY DAY Patient taking differently: Take 10 mg by mouth daily.  10/04/19  Yes Troy Sine, MD  famotidine (PEPCID) 20 MG tablet TAKE 1 TABLET (20 MG TOTAL) BY MOUTH DAILY. NEEDS APPOINTMENT FOR FUTURE REFILLS 09/17/19  Yes  Troy Sine, MD  Fluticasone-Salmeterol (ADVAIR DISKUS) 500-50 MCG/DOSE AEPB Inhale 1 puff into the lungs 2 (two) times daily.   Yes [provider]  Fluticasone-Umeclidin-Vilant (TRELEGY ELLIPTA) 100-62.5-25 MCG/INH AEPB Inhale 1 puff into the lungs daily.   Yes [provider]  INCRUSE ELLIPTA 62.5 MCG/INH AEPB Inhale 1 puff into the lungs daily.  12/09/16  Yes [provider]  Ipratropium-Albuterol (COMBIVENT RESPIMAT) 20-100 MCG/ACT AERS respimat Inhale 1 puff into the lungs every 6 (six) hours as needed for wheezing or shortness of breath.    Yes [provider]  lidocaine-prilocaine (EMLA) cream Apply to affected area once 07/05/19  Yes Sindy Guadeloupe, MD  metFORMIN (GLUCOPHAGE) 500 MG tablet TAKE 1 TABLET (500 MG TOTAL) BY MOUTH 2 (TWO) TIMES DAILY WITH A MEAL. Patient  taking differently: Take 500 mg by mouth daily with breakfast.  09/06/19  Yes Troy Sine, MD  ondansetron (ZOFRAN) 8 MG tablet Take 1 tablet (8 mg total) by mouth 2 (two) times daily as needed for refractory nausea / vomiting. Start on day 3 after carboplatin chemo. 07/05/19  Yes Sindy Guadeloupe, MD  prochlorperazine (COMPAZINE) 10 MG tablet Take 1 tablet (10 mg total) by mouth every 6 (six) hours as needed (Nausea or vomiting). 07/05/19  Yes Sindy Guadeloupe, MD  ranolazine (RANEXA) 1000 MG SR tablet TAKE 1 TABLET BY MOUTH TWICE A DAY Patient taking differently: Take 1,000 mg by mouth 2 (two) times daily.  06/11/19  Yes Troy Sine, MD  traMADol (ULTRAM) 50 MG tablet Take 1 tablet (50 mg total) by mouth every 6 (six) hours as needed. 10/12/19  Yes Sindy Guadeloupe, MD  Baclofen 5 MG TABS Take 5 mg by mouth 3 (three) times daily. If 5 mg tid is not working then can increase to 10 mg tid Patient not taking: Reported on 12/06/2019 07/27/19   Sindy Guadeloupe, MD  bisoprolol (ZEBETA) 5 MG tablet Take 0.5 tablets (2.5 mg total) by mouth daily for 7 days, THEN 1 tablet (5 mg total) daily. 10/27/19 08/29/20  Troy Sine, MD  dexamethasone (DECADRON) 4 MG tablet Take 2 tablets (8 mg total) by mouth daily. Start the day after chemotherapy for 1 day. Patient not taking: Reported on 12/06/2019 07/05/19   Sindy Guadeloupe, MD  doxycycline (VIBRA-TABS) 100 MG tablet Take 1 tablet (100 mg total) by mouth 2 (two) times daily. Patient not taking: Reported on 12/06/2019 10/14/19   Sindy Guadeloupe, MD  isosorbide mononitrate (IMDUR) 60 MG 24 hr tablet TAKE 1 TABLET BY MOUTH EVERY DAY Patient not taking: Reported on 12/06/2019 05/08/15   Troy Sine, MD  LORazepam (ATIVAN) 0.5 MG tablet Take 1 tablet 30 minutes prior to each scan Patient not taking: Reported on 12/06/2019 07/15/19   Sindy Guadeloupe, MD  losartan-hydrochlorothiazide (HYZAAR) 100-12.5 MG tablet Take 1 tablet by mouth daily. Patient not taking: Reported on  12/06/2019 12/31/16   Troy Sine, MD  magic mouthwash w/lidocaine SOLN Take 5 mLs by mouth 4 (four) times daily as needed for mouth pain. Patient not taking: Reported on 12/06/2019 08/03/19   Sindy Guadeloupe, MD  nystatin (MYCOSTATIN) 100000 UNIT/ML suspension Take 5 mLs (500,000 Units total) by mouth 4 (four) times daily. 07/27/19   Sindy Guadeloupe, MD   Physical Exam: Blood pressure (!) 150/86, pulse 79, temperature 98.1 F (36.7 C), temperature source Oral, resp. rate 20, height 5\' 8"  (1.727 m), weight 78 kg, SpO2 95 %.  Vitals:   12/14/19 1447 12/14/19 1450  BP:    Pulse:    Resp:    Temp:    SpO2: 90% 95%     General: Awake, alert, no acute distress, chronically ill-appearing  Eyes: EOMI, clear conjunctiva  ENT: Hearing grossly normal, moist mucous members, nasal cannula in place  Neck: No JVD or tenderness  Cardiovascular: RRR, normal S1/S2, no murmurs rubs or gallops appreciated.  No peripheral edema.  Respiratory: Clear to auscultation bilaterally with occasional expiratory wheeze on the left, no crackles or rhonchi, normal respiratory effort  Abdomen: Surgical incisions intact without bleeding or signs of infection, appropriate postop tenderness  Skin: Dry, intact, normal temperature  Musculoskeletal: Normal inspection, moves all extremities, no gross deformities  Psychiatric: Normal mood, congruent affect, normal judgment and insight  Neurologic: CN II through XII grossly intact, normal speech, grossly nonfocal exam  Labs on Admission:  Basic Metabolic Panel: Recent Labs  Lab 12/09/19 1120 12/14/19 0718  NA 141 138  K 4.6 4.0  CL 103 102  CO2 30 26  GLUCOSE 154* 143*  BUN 10 15  CREATININE 1.14 1.86*  CALCIUM 9.6 8.7*   Liver Function Tests: No results for input(s): AST, ALT, ALKPHOS, BILITOT, PROT, ALBUMIN in the last 168 hours. No results for input(s): LIPASE, AMYLASE in the last 168 hours. No results for input(s): AMMONIA in the last 168  hours. CBC: Recent Labs  Lab 12/09/19 1120 12/14/19 0718  WBC 12.2* 15.5*  HGB 11.5* 10.5*  HCT 36.4* 33.6*  MCV 94.8 95.7  PLT 307 290   Cardiac Enzymes: No results for input(s): CKTOTAL, CKMB, CKMBINDEX, TROPONINI in the last 168 hours. BNP: Invalid input(s): POCBNP CBG: Recent Labs  Lab 12/13/19 0628 12/13/19 1146 12/14/19 0537  GLUCAP 124* 160* 143*    Radiological Exams on Admission: No results found.    Time spent: 45 minutes  Ezekiel Slocumb, DO [facility]  If 7PM-7AM, please contact night-coverage [email or web address] 12/14/2019, 5:48 PM

## 2019-12-15 ENCOUNTER — Inpatient Hospital Stay: Payer: Medicare Other

## 2019-12-15 DIAGNOSIS — J449 Chronic obstructive pulmonary disease, unspecified: Secondary | ICD-10-CM

## 2019-12-15 DIAGNOSIS — N200 Calculus of kidney: Secondary | ICD-10-CM

## 2019-12-15 DIAGNOSIS — J9601 Acute respiratory failure with hypoxia: Secondary | ICD-10-CM

## 2019-12-15 DIAGNOSIS — I5020 Unspecified systolic (congestive) heart failure: Secondary | ICD-10-CM

## 2019-12-15 DIAGNOSIS — I255 Ischemic cardiomyopathy: Secondary | ICD-10-CM

## 2019-12-15 LAB — TYPE AND SCREEN
ABO/RH(D): O NEG
Antibody Screen: NEGATIVE
Unit division: 0
Unit division: 0

## 2019-12-15 LAB — BPAM RBC
Blood Product Expiration Date: 202103162359
Blood Product Expiration Date: 202103162359
Unit Type and Rh: 9500
Unit Type and Rh: 9500

## 2019-12-15 LAB — SURGICAL PATHOLOGY

## 2019-12-15 MED ORDER — OXYBUTYNIN CHLORIDE 5 MG PO TABS: 5.0000 mg | ORAL_TABLET | Freq: Three times a day (TID) | ORAL | 0 refills | Status: DC | PRN

## 2019-12-15 MED ORDER — NICOTINE 21 MG/24HR TD PT24: 21.0000 mg | MEDICATED_PATCH | Freq: Every day | TRANSDERMAL | 0 refills | Status: DC

## 2019-12-15 MED ORDER — ACETAMINOPHEN 325 MG PO TABS: 650.0000 mg | ORAL_TABLET | ORAL | 0 refills | Status: DC | PRN

## 2019-12-15 MED ORDER — BELLADONNA ALKALOIDS-OPIUM 16.2-60 MG RE SUPP: 1.0000 | Freq: Four times a day (QID) | RECTAL | 0 refills | Status: DC | PRN

## 2019-12-15 MED ORDER — OXYCODONE-ACETAMINOPHEN 5-325 MG PO TABS: 1.0000 | ORAL_TABLET | ORAL | 0 refills | Status: DC | PRN

## 2019-12-15 MED ORDER — LOSARTAN POTASSIUM 50 MG PO TABS: 50.0000 mg | ORAL_TABLET | Freq: Every day | ORAL | 11 refills | Status: AC

## 2019-12-15 MED ORDER — CEPHALEXIN 500 MG PO CAPS: 500.0000 mg | ORAL_CAPSULE | Freq: Four times a day (QID) | ORAL | 0 refills | Status: DC

## 2019-12-15 NOTE — TOC Initial Note (Signed)
Transition of Care [facility]) - Initial/Assessment Note    Patient Details  Name: Troy [name] MRN: [id number] Date of Birth: 1951-03-15  Transition of Care [facility]) CM/SW Contact:    Beverly Sessions, RN Phone Number: 12/15/2019, 2:01 PM  Clinical Narrative:                  Patient admitted from home with nephrolithiasis  Patient states that he lives at home with his wife, son, and grandson.   In further discussion wife actually lives in a trailer separately on the same property.  At discharge the patient will be staying with her at discharge [place]. [phone]  Patient in agreement home health services.  Patient and wife state do not have a preference of home health agency.  Referral made to [facility] with [facility].   Patient with qualify sats and dx for home O2.  Brad with [employer] to deliver portable O2 prior to discharge.   RNCM signing off    Expected Discharge Plan: [place] Barriers to Discharge: No Barriers Identified   Patient Goals and CMS Choice     Choice offered to / list presented to : Patient  Expected Discharge Plan and Services Expected Discharge Plan: [place] Acute Care Choice: [place] arrangements for the past 2 months: Single Family Home, Mobile Home Expected Discharge Date: 12/15/19               DME Arranged: Oxygen DME Agency: [employer] Date DME Agency Contacted: 12/15/19   Representative spoke with at DME Agency: [place]: RN [place] Agency: [place] Date [place]: 12/15/19   Representative spoke with at [place]: Tommi Rumps  Prior Living Arrangements/Services Living arrangements for the past 2 months: Single Family Home, Mobile Home Lives with:: Spouse, Adult Children Patient language and need for interpreter reviewed:: Yes Do you feel safe going back to the place where you live?: Yes      Need for Family Participation in Patient  Care: Yes (Comment) Care giver support system in place?: Yes (comment) Current home services: DME Criminal Activity/Legal Involvement Pertinent to Current Situation/Hospitalization: No - Comment as needed  Activities of Daily Living Home Assistive Devices/Equipment: CPAP ADL Screening (condition at time of admission) Patient's cognitive ability adequate to safely complete daily activities?: Yes Is the patient deaf or have difficulty hearing?: No Does the patient have difficulty seeing, even when wearing glasses/contacts?: No Does the patient have difficulty concentrating, remembering, or making decisions?: No Patient able to express need for assistance with ADLs?: Yes Does the patient have difficulty dressing or bathing?: No Independently performs ADLs?: Yes (appropriate for developmental age) Does the patient have difficulty walking or climbing stairs?: No Weakness of Legs: None Weakness of Arms/Hands: None  Permission Sought/Granted                  Emotional Assessment       Orientation: : Oriented to Self, Oriented to Place, Oriented to  Time, Oriented to Situation   Psych Involvement: No (comment)  Admission diagnosis:  Left nephrolithiasis [N20.0] Patient Active Problem List   Diagnosis Date Noted  . Acute respiratory failure with hypoxia ([place]) 12/14/2019  . Left nephrolithiasis 12/13/2019  . Large cell neuroendocrine carcinoma ([place]) 07/05/2019  . Goals of care, counseling/discussion 07/05/2019  . Renal mass 07/05/2019  . Malignant neoplasm of upper lobe of right lung ([place]) 07/05/2019  .  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 Obesity (BMI 30.0-34.9) 09/05/2014  . Smoking 10/21/2011  . Unstable angina ([place]) 10/18/2011  . S/P CABG x 4, 2006.PCI 11/10, cath this adm- Med Rx 10/18/2011  . HTN (hypertension) 10/18/2011  . Dyslipidemia 10/18/2011  . COPD (chronic obstructive pulmonary disease) ([place]) 10/18/2011  .  Cardiomyopathy, ischemic, EF 40-45% 2D 11/10 10/18/2011   PCP:  [name], MD Pharmacy:   [id number] [place], [place] [address] Phone: [phone] Fax: [phone]     Social Determinants of Health (SDOH) Interventions    Readmission Risk Interventions Readmission Risk Prevention Plan 12/15/2019  Transportation Screening Complete  PCP or Specialist Appt within 3-5 Days (No Data)  [place] or [place] Complete  Palliative Care Screening Not Applicable  Medication Review (RN Care Manager) Complete  Some recent data might be hidden

## 2019-12-15 NOTE — Care Management (Signed)
Patient high risk for readmission.  RNCM attempted to meet with patient to complete assessment, and discuss need for home o2.  Patient currently off the floor, wife in the room and request that I come back

## 2019-12-15 NOTE — Discharge Summary (Signed)
Physician Discharge Summary  Troy [name] AVW:[id number] DOB: 07/20/1951 DOA: 12/13/2019  PCP: [name], MD  Admit date: 12/13/2019 Discharge date: 12/15/2019  Recommendations for Outpatient Follow-up:  1. Follow up with PCP in 7-10 days. 2. Follow up with urology as directed. 3. Home with home health RN 4. Home with 2LO2 by nasal cannula.  Follow-up Information    [name], MD. Go on 12/28/2019.   Specialty: Internal Medicine Why: 2pm appointment Contact information: [place] [address] [phone]          Discharge Diagnoses: Principal diagnosis is #1 1. Acute respiratory failure with acute on chronic hypoxia 2. COPD exacerbation 3. History of lung cancer s/p recent chemotherapy and radiation 4. OSA on CPAP with nocturnal oxygen 5. Ongoing tobacco abuse 6. Chronic HFrEF and ischemic cardiomyopathy 7. CAD, S/P 4V CABG and PCI 8. Renal mass, s/p nephrectomy  Discharge Condition: Fair Disposition: Home  Diet recommendation: Heart Healthy  Filed Weights   12/13/19 0629  Weight: 78 kg   History of present illness:  69 year old male with biopsy-proven right renal cell carcinoma who presents today to discuss operative management of his large tumor. 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 Donella Stade Dr. Janese Banks. He has completed radiation to the chest and has 1 more dose of chemotherapy. He has tolerated this reasonably well.   As part of his staging work-up for  this, he underwent a PET scan which showed an incidental large right renal mass. Most recently for further staging, he underwent an MRI of the abdomen with and without contrast revealing a 10.9 cm right lower pole renal mass which is a heterogeneously enhancing without pathologic adenopathy or renal vein involvement.   Given his history of multimalignancies, he did undergo biopsy of this which was indicative of renal cell carcinoma, nuclear grade 2.  Most recent staging imaging in the form of CT abdomen pelvis with contrast on 10/14/2019 shows no significant interval growth of his renal mass, lesion now measures 9.2 x 9.2 cm.. Radiation changes of the lungs are appreciated.   Hospital Course:  69 year old male with history of COPD, recent lung cancer diagnosis status post chemoradiation, OSA on CPAP with nocturnal oxygen, ongoing tobacco abuse (2 pack/day), chronic HFrEF/ischemic cardiomyopathy, CAD status post four-vessel CABG and PCI, biopsy-proven right renal cell carcinoma for which patient was admitted to urology service for right nephrectomy which was done on 3/1.  Today, postop day 1, patient was noted to have persistent hypoxia, so hospitalist team was consulted for evaluation and management.  Patient seen with wife at bedside today.  He denies any recent fevers or chills or respiratory symptoms prior to being admitted for surgery.  He denies chest pain or shortness of breath.  Says he uses oxygen only at night with his CPAP but has never required continuous oxygen previously.  He states he has/had 4 different types of cancer, underwent chemoradiation for lung cancer which was diagnosed just past this past September.  States he has never had any blood clots  in the past.  Has not been very active recently due to his declining health status.  Denies recent illnesses or sick contacts, abdominal complaints including nausea vomiting or diarrhea.  Does report abdominal pain secondary to surgery  yesterday.  Has a chronic intermittent cough that is occasionally productive, this is no worse than baseline.  The patient has qualified for home O2 due to oxygen saturations of 80% with ambulation on room air. He will be discharged to home with home health RN and home O2.  Today's assessment: S: The patient is resting comfortably. No new complaints. O: Vitals:  Vitals:   12/15/19 0721 12/15/19 1404  BP:  (!) 142/85  Pulse:  97  Resp:    Temp:  98.3 F (36.8 C)  SpO2: 93% 97%   Exam:  Constitutional:  . The patient is awake, alert, and oriented x 3. No acute distress. Respiratory:  . No increased work of breathing. . No wheezes, rales, or rhonchi . No tactile fremitus Cardiovascular:  . Regular rate and rhythm . No murmurs, ectopy, or gallups. . No lateral PMI. No thrills. Abdomen:  . Abdomen is soft, non-tender, non-distended . No hernias, masses, or organomegaly . Normoactive bowel sounds.  Musculoskeletal:  . No cyanosis, clubbing, or edema Skin:  . No rashes, lesions, ulcers . palpation of skin: no induration or nodules Neurologic:  . CN 2-12 intact . Sensation all 4 extremities intact Psychiatric:  . Mental status o Mood, affect appropriate o Orientation to person, place, time  . judgment and insight appear intact  Discharge Instructions  Discharge Instructions    (HEART FAILURE PATIENTS) Call MD:  Anytime you have any of the following symptoms: 1) 3 pound weight gain in 24 hours or 5 pounds in 1 week 2) shortness of breath, with or without a dry hacking cough 3) swelling in the hands, feet or stomach 4) if you have to sleep on extra pillows at night in order to breathe.   Complete by: As directed    Activity as tolerated - No restrictions   Complete by: As directed    Call MD for:  difficulty breathing, headache or visual disturbances   Complete by: As directed    Call MD for:  persistant nausea and vomiting   Complete by: As directed    Call MD for:   severe uncontrolled pain   Complete by: As directed    Diet - low sodium heart healthy   Complete by: As directed    Discharge instructions   Complete by: As directed    Follow up with PCP in 7-10 days. Follow up with urology as directed.   Face-to-face encounter (required for Medicare/Medicaid patients)   Complete by: As directed    I Genevia Bouldin certify that this patient is under my care and that I, or a nurse practitioner or physician's assistant working with me, had a face-to-face encounter that meets the physician face-to-face encounter requirements with this patient on 12/15/2019. The encounter with the patient was in whole, or in part for the following medical condition(s) which is the primary reason for home health care (List medical condition): Acute on chronic HFrEF, Acute on chronic respiratory failure, COPD, ischeic cardiomyopathy,Renal mass   The encounter with the patient was in whole, or in part, for the following medical condition, which is the primary reason for home health care: Acute on chronic HFrEF, Acute on chronic respiratory failure, COPD, ischeic cardiomyopathy,   I certify that, based on my findings, the  following services are medically necessary home health services: Nursing   Reason for Medically Necessary Home Health Services:  Skilled Nursing- Skilled Assessment/Observation Skilled Nursing- Changes in Medication/Medication Management     My clinical findings support the need for the above services: Shortness of breath with activity   Further, I certify that my clinical findings support that this patient is homebound due to: Shortness of Breath with activity   Heart Failure patients record your daily weight using the same scale at the same time of day   Complete by: As directed    Home Health   Complete by: As directed    To provide the following care/treatments: RN   Increase activity slowly   Complete by: As directed    STOP any activity that causes chest pain,  shortness of breath, dizziness, sweating, or exessive weakness   Complete by: As directed      Allergies as of 12/15/2019      Reactions   Codeine Nausea And Vomiting, Other (See Comments)   Pt states when he takes codeine, he throws up blood   Niacin And Related Other (See Comments)   "made his body feel hot & he had a hard time breathing"      Medication List    STOP taking these medications   amLODipine 10 MG tablet Commonly known as: NORVASC   Baclofen 5 MG Tabs   dexamethasone 4 MG tablet Commonly known as: DECADRON   doxycycline 100 MG tablet Commonly known as: VIBRA-TABS   lidocaine-prilocaine cream Commonly known as: EMLA   LORazepam 0.5 MG tablet Commonly known as: ATIVAN   losartan-hydrochlorothiazide 100-12.5 MG tablet Commonly known as: HYZAAR   magic mouthwash w/lidocaine Soln   nystatin 100000 UNIT/ML suspension Commonly known as: MYCOSTATIN   ondansetron 8 MG tablet Commonly known as: Zofran   prochlorperazine 10 MG tablet Commonly known as: COMPAZINE   traMADol 50 MG tablet Commonly known as: ULTRAM     TAKE these medications   acetaminophen 325 MG tablet Commonly known as: TYLENOL Take 2 tablets (650 mg total) by mouth every 4 (four) hours as needed for mild pain or headache (or temperature > 101 F).   Advair Diskus 500-50 MCG/DOSE Aepb Generic drug: Fluticasone-Salmeterol Inhale 1 puff into the lungs 2 (two) times daily.   ALPRAZolam 0.25 MG tablet Commonly known as: XANAX Take 0.25 mg by mouth 3 (three) times daily as needed for anxiety or sleep.   aspirin EC 81 MG tablet Take 81 mg by mouth daily.   atorvastatin 40 MG tablet Commonly known as: LIPITOR TAKE 1 TABLET (40 MG TOTAL) BY MOUTH AT BEDTIME. KEEP OCTOBER APPOINTMENT What changed: additional instructions   bisoprolol 5 MG tablet Commonly known as: ZEBETA Take 0.5 tablets (2.5 mg total) by mouth daily for 7 days, THEN 1 tablet (5 mg total) daily. Start taking on:  October 27, 2019   busPIRone 7.5 MG tablet Commonly known as: BUSPAR Take 7.5 mg by mouth 2 (two) times daily.   Combivent Respimat 20-100 MCG/ACT Aers respimat Generic drug: Ipratropium-Albuterol Inhale 1 puff into the lungs every 6 (six) hours as needed for wheezing or shortness of breath.   ezetimibe 10 MG tablet Commonly known as: ZETIA TAKE 1 TABLET BY MOUTH EVERY DAY   famotidine 20 MG tablet Commonly known as: PEPCID TAKE 1 TABLET (20 MG TOTAL) BY MOUTH DAILY. NEEDS APPOINTMENT FOR FUTURE REFILLS   Incruse Ellipta 62.5 MCG/INH Aepb Generic drug: umeclidinium bromide Inhale 1 puff into the  lungs daily.   isosorbide mononitrate 60 MG 24 hr tablet Commonly known as: IMDUR TAKE 1 TABLET BY MOUTH EVERY DAY   losartan 50 MG tablet Commonly known as: Cozaar Take 1 tablet (50 mg total) by mouth daily.   metFORMIN 500 MG tablet Commonly known as: GLUCOPHAGE TAKE 1 TABLET (500 MG TOTAL) BY MOUTH 2 (TWO) TIMES DAILY WITH A MEAL. What changed: when to take this   nicotine 21 mg/24hr patch Commonly known as: NICODERM CQ - dosed in mg/24 hours Place 1 patch (21 mg total) onto the skin daily. Start taking on: December 16, 2019   opium-belladonna 16.2-60 MG suppository Commonly known as: B&O SUPPRETTES Place 1 suppository rectally every 6 (six) hours as needed for bladder spasms.   oxybutynin 5 MG tablet Commonly known as: DITROPAN Take 1 tablet (5 mg total) by mouth every 8 (eight) hours as needed for bladder spasms.   oxyCODONE-acetaminophen 5-325 MG tablet Commonly known as: PERCOCET/ROXICET Take 1-2 tablets by mouth every 4 (four) hours as needed for moderate pain.   ranolazine 1000 MG SR tablet Commonly known as: RANEXA TAKE 1 TABLET BY MOUTH TWICE A DAY   Trelegy Ellipta 100-62.5-25 MCG/INH Aepb Generic drug: Fluticasone-Umeclidin-Vilant Inhale 1 puff into the lungs daily.            Durable Medical Equipment  (From admission, onward)         Start      Ordered   12/15/19 1219  DME Oxygen  Once    Question Answer Comment  Length of Need Lifetime   Mode or (Route) Nasal cannula   Liters per Minute 2   Frequency Continuous (stationary and portable oxygen unit needed)   Oxygen delivery system Gas      12/15/19 1220         Allergies  Allergen Reactions  . Codeine Nausea And Vomiting and Other (See Comments)    Pt states when he takes codeine, he throws up blood  . Niacin And Related Other (See Comments)    "made his body feel hot & he had a hard time breathing"    The results of significant diagnostics from this hospitalization (including imaging, microbiology, ancillary and laboratory) are listed below for reference.    Significant Diagnostic Studies: US Venous Img Lower Bilateral (DVT)  Result Date: 12/15/2019 CLINICAL DATA:  Hypoxia. History of smoking. History of prostate cancer. Evaluate for DVT. EXAM: BILATERAL LOWER EXTREMITY VENOUS DOPPLER ULTRASOUND TECHNIQUE: Gray-scale sonography with graded compression, as well as color Doppler and duplex ultrasound were performed to evaluate the lower extremity deep venous systems from the level of the common femoral vein and including the common femoral, femoral, profunda femoral, popliteal and calf veins including the posterior tibial, peroneal and gastrocnemius veins when visible. The superficial great saphenous vein was also interrogated. Spectral Doppler was utilized to evaluate flow at rest and with distal augmentation maneuvers in the common femoral, femoral and popliteal veins. COMPARISON:  None. FINDINGS: RIGHT LOWER EXTREMITY Common Femoral Vein: No evidence of thrombus. Normal compressibility, respiratory phasicity and response to augmentation. Saphenofemoral Junction: No evidence of thrombus. Normal compressibility and flow on color Doppler imaging. Profunda Femoral Vein: No evidence of thrombus. Normal compressibility and flow on color Doppler imaging. Femoral Vein: No evidence of  thrombus. Normal compressibility, respiratory phasicity and response to augmentation. Popliteal Vein: No evidence of thrombus. Normal compressibility, respiratory phasicity and response to augmentation. Calf Veins: No evidence of thrombus. Normal compressibility and flow on color Doppler imaging. Superficial  Great Saphenous Vein: No evidence of thrombus. Normal compressibility. Venous Reflux:  None. Other Findings:  None. LEFT LOWER EXTREMITY Common Femoral Vein: No evidence of thrombus. Normal compressibility, respiratory phasicity and response to augmentation. Saphenofemoral Junction: No evidence of thrombus. Normal compressibility and flow on color Doppler imaging. Profunda Femoral Vein: No evidence of thrombus. Normal compressibility and flow on color Doppler imaging. Femoral Vein: No evidence of thrombus. Normal compressibility, respiratory phasicity and response to augmentation. Popliteal Vein: No evidence of thrombus. Normal compressibility, respiratory phasicity and response to augmentation. Calf Veins: No evidence of thrombus. Normal compressibility and flow on color Doppler imaging. Superficial Great Saphenous Vein: No evidence of thrombus. Normal compressibility. Venous Reflux:  None. Other Findings:  None. IMPRESSION: No evidence of DVT within either lower extremity. Electronically Signed   By: Sandi Mariscal M.D.   On: 12/15/2019 11:36   DG Chest Port 1 View  Result Date: 12/14/2019 CLINICAL DATA:  69 year old male with hypoxia. History of COPD and lung cancer. EXAM: PORTABLE CHEST 1 VIEW COMPARISON:  Chest radiograph dated 06/23/2019 and CT dated 10/14/2019. FINDINGS: Left pectoral Port-A-Cath with tip over central SVC. Focal area of subpleural nodularity and associated reticulation/scarring in the adjacent lung parenchyma. Diffuse right lower lobe reticulonodular densities similar to the prior chest CT. No lobar consolidation or pneumothorax. Mild cardiomegaly. Median sternotomy wires and CABG  vascular clips. Atherosclerotic calcification of the aorta. No acute osseous pathology. IMPRESSION: No significant interval change in the appearance of the lungs compared to the prior chest CT. Right middle lobe subpleural nodular density and scattered right lower lobe nodules as seen on the prior CT. Electronically Signed   By: Anner Crete M.D.   On: 12/14/2019 18:00    Microbiology: Recent Results (from the past 240 hour(s))  SARS CORONAVIRUS 2 (TAT 6-24 HRS) Nasopharyngeal Nasopharyngeal Swab     Status: None   Collection Time: 12/09/19 11:20 AM   Specimen: Nasopharyngeal Swab  Result Value Ref Range Status   SARS Coronavirus 2 NEGATIVE NEGATIVE Final    Comment: (NOTE) SARS-CoV-2 target nucleic acids are NOT DETECTED. The SARS-CoV-2 RNA is generally detectable in upper and lower respiratory specimens during the acute phase of infection. Negative results do not preclude SARS-CoV-2 infection, do not rule out co-infections with other pathogens, and should not be used as the sole basis for treatment or other patient management decisions. Negative results must be combined with clinical observations, patient history, and epidemiological information. The expected result is Negative. Fact Sheet for Patients: SugarRoll.be Fact Sheet for Healthcare Providers: https://www.woods-mathews.com/ This test is not yet approved or cleared by the [place] FDA and  has been authorized for detection and/or diagnosis of SARS-CoV-2 by FDA under an Emergency Use Authorization (EUA). This EUA will remain  in effect (meaning this test can be used) for the duration of the COVID-19 declaration under Section 56 4(b)(1) of the Act, 21 U.S.C. section 360bbb-3(b)(1), unless the authorization is terminated or revoked sooner. Performed at [facility], [place] [address] [place], [place] [address]      Labs: Basic Metabolic Panel: Recent Labs  Lab  12/09/19 1120 12/14/19 0718  NA 141 138  K 4.6 4.0  CL 103 102  CO2 30 26  GLUCOSE 154* 143*  BUN 10 15  CREATININE 1.14 1.86*  CALCIUM 9.6 8.7*   Liver Function Tests: No results for input(s): AST, ALT, ALKPHOS, BILITOT, PROT, ALBUMIN in the last 168 hours. No results for input(s): LIPASE, AMYLASE in the last 168  hours. No results for input(s): AMMONIA in the last 168 hours. CBC: Recent Labs  Lab 12/09/19 1120 12/14/19 0718  WBC 12.2* 15.5*  HGB 11.5* 10.5*  HCT 36.4* 33.6*  MCV 94.8 95.7  PLT 307 290   Cardiac Enzymes: No results for input(s): CKTOTAL, CKMB, CKMBINDEX, TROPONINI in the last 168 hours. BNP: BNP (last 3 results) No results for input(s): BNP in the last 8760 hours.  ProBNP (last 3 results) No results for input(s): PROBNP in the last 8760 hours.  CBG: Recent Labs  Lab 12/13/19 0628 12/13/19 1146 12/14/19 0537  GLUCAP 124* 160* 143*    Active Problems:   Left nephrolithiasis   Acute respiratory failure with hypoxia (HCC)   Time coordinating discharge: 38 minutes.  Signed:        Hakeem Frazzini, DO [facility]  12/15/2019, 2:47 PM

## 2019-12-15 NOTE — Progress Notes (Signed)
Qualifying O2 Saturation:  92% sitting (room air) 80% ambulating (room air) 90% ambulating (2L nasal cannula) 95% sitting (2L nasal cannula)  12/14/2019 1450 Marry Guan, RN

## 2019-12-15 NOTE — Progress Notes (Signed)
   12/14/19 1439 12/14/19 1444 12/14/19 1447  Oxygen Therapy  SpO2 92 % (!) 80 % 90 %  O2 Device Room Air Room Air Nasal Cannula  O2 Flow Rate (L/min)  --   --  2 L/min  Patient Activity (if Appropriate) In bed Ambulating Ambulating    12/14/19 1450  Oxygen Therapy  SpO2 95 %  O2 Device Nasal Cannula  O2 Flow Rate (L/min) 2 L/min  Patient Activity (if Appropriate) In bed

## 2019-12-15 NOTE — Progress Notes (Addendum)
SATURATION QUALIFICATIONS: (This note is used to comply with regulatory documentation for home oxygen)  Patient Saturations on Room Air at Rest = 92%  Patient Saturations on Room Air while Ambulating = 80%  Patient Saturations on 2 Liters of oxygen while Ambulating = 90%  12/14/2019

## 2019-12-15 NOTE — Progress Notes (Signed)
2 Days Post-Op Subjective: Ambulating.  Requiring 02 with ambulation, medicine following.  LE dopplers negative.  Anxious for discharge.  + flatus but no BM.  Tolerating diet.  Objective: Vital signs in last 24 hours: Temp:  [97.5 F (36.4 C)-98.3 F (36.8 C)] 98.3 F (36.8 C) (03/03 1404) Pulse Rate:  [86-97] 97 (03/03 1404) Resp:  [20] 20 (03/03 0456) BP: (142-147)/(74-85) 142/85 (03/03 1404) SpO2:  [93 %-99 %] 97 % (03/03 1404)  Intake/Output from previous day: 03/02 0701 - 03/03 0700 In: 1694.1 [I.V.:1544.1; IV Piggyback:150] Out: 2020 [Urine:2020] Intake/Output this shift: No intake/output data recorded.  Physical Exam:  General: Alert and oriented. CV: RRR Lungs: Clear bilaterally. GI: Soft, Nondistended. Incisions: Clean and dry. Extremities: Nontender, no erythema, no edema.  Lab Results: Recent Labs    12/14/19 0718  HGB 10.5*  HCT 33.6*          Recent Labs    12/09/19 1120 12/14/19 0718  CREATININE 1.14 1.86*    Assessment/Plan: POD# 2 s/p robotic right radical nephrectomy.  1) Ambulate, Incentive spirometry 2) Medicine following- appreciate assistance with pulmonary comorbities 3) Appropriate for discharge from urological perspective    LOS: 2 days   Hollice Espy 12/15/2019, 2:55 PM

## 2019-12-15 NOTE — Progress Notes (Signed)
Hunt Oris Abdullah to be D/C'd Home per MD order.  Discussed prescriptions and follow up appointments with the patient. Prescriptions given to patient, medication list explained in detail. Pt verbalized understanding.  Allergies as of 12/15/2019      Reactions   Codeine Nausea And Vomiting, Other (See Comments)   Pt states when he takes codeine, he throws up blood   Niacin And Related Other (See Comments)   "made his body feel hot & he had a hard time breathing"      Medication List    STOP taking these medications   amLODipine 10 MG tablet Commonly known as: NORVASC   Baclofen 5 MG Tabs   dexamethasone 4 MG tablet Commonly known as: DECADRON   doxycycline 100 MG tablet Commonly known as: VIBRA-TABS   lidocaine-prilocaine cream Commonly known as: EMLA   LORazepam 0.5 MG tablet Commonly known as: ATIVAN   losartan-hydrochlorothiazide 100-12.5 MG tablet Commonly known as: HYZAAR   magic mouthwash w/lidocaine Soln   nystatin 100000 UNIT/ML suspension Commonly known as: MYCOSTATIN   ondansetron 8 MG tablet Commonly known as: Zofran   prochlorperazine 10 MG tablet Commonly known as: COMPAZINE   traMADol 50 MG tablet Commonly known as: ULTRAM     TAKE these medications   acetaminophen 325 MG tablet Commonly known as: TYLENOL Take 2 tablets (650 mg total) by mouth every 4 (four) hours as needed for mild pain or headache (or temperature > 101 F).   Advair Diskus 500-50 MCG/DOSE Aepb Generic drug: Fluticasone-Salmeterol Inhale 1 puff into the lungs 2 (two) times daily.   ALPRAZolam 0.25 MG tablet Commonly known as: XANAX Take 0.25 mg by mouth 3 (three) times daily as needed for anxiety or sleep.   aspirin EC 81 MG tablet Take 81 mg by mouth daily.   atorvastatin 40 MG tablet Commonly known as: LIPITOR TAKE 1 TABLET (40 MG TOTAL) BY MOUTH AT BEDTIME. KEEP OCTOBER APPOINTMENT What changed: additional instructions   bisoprolol 5 MG tablet Commonly known as:  ZEBETA Take 0.5 tablets (2.5 mg total) by mouth daily for 7 days, THEN 1 tablet (5 mg total) daily. Start taking on: October 27, 2019   busPIRone 7.5 MG tablet Commonly known as: BUSPAR Take 7.5 mg by mouth 2 (two) times daily.   Combivent Respimat 20-100 MCG/ACT Aers respimat Generic drug: Ipratropium-Albuterol Inhale 1 puff into the lungs every 6 (six) hours as needed for wheezing or shortness of breath.   ezetimibe 10 MG tablet Commonly known as: ZETIA TAKE 1 TABLET BY MOUTH EVERY DAY   famotidine 20 MG tablet Commonly known as: PEPCID TAKE 1 TABLET (20 MG TOTAL) BY MOUTH DAILY. NEEDS APPOINTMENT FOR FUTURE REFILLS   Incruse Ellipta 62.5 MCG/INH Aepb Generic drug: umeclidinium bromide Inhale 1 puff into the lungs daily.   isosorbide mononitrate 60 MG 24 hr tablet Commonly known as: IMDUR TAKE 1 TABLET BY MOUTH EVERY DAY   losartan 50 MG tablet Commonly known as: Cozaar Take 1 tablet (50 mg total) by mouth daily.   metFORMIN 500 MG tablet Commonly known as: GLUCOPHAGE TAKE 1 TABLET (500 MG TOTAL) BY MOUTH 2 (TWO) TIMES DAILY WITH A MEAL. What changed: when to take this   nicotine 21 mg/24hr patch Commonly known as: NICODERM CQ - dosed in mg/24 hours Place 1 patch (21 mg total) onto the skin daily. Start taking on: December 16, 2019   opium-belladonna 16.2-60 MG suppository Commonly known as: B&O SUPPRETTES Place 1 suppository rectally every 6 (six) hours  as needed for bladder spasms.   oxybutynin 5 MG tablet Commonly known as: DITROPAN Take 1 tablet (5 mg total) by mouth every 8 (eight) hours as needed for bladder spasms.   oxyCODONE-acetaminophen 5-325 MG tablet Commonly known as: PERCOCET/ROXICET Take 1-2 tablets by mouth every 4 (four) hours as needed for moderate pain.   ranolazine 1000 MG SR tablet Commonly known as: RANEXA TAKE 1 TABLET BY MOUTH TWICE A DAY   Trelegy Ellipta 100-62.5-25 MCG/INH Aepb Generic drug: Fluticasone-Umeclidin-Vilant Inhale 1  puff into the lungs daily.            Durable Medical Equipment  (From admission, onward)         Start     Ordered   12/15/19 1219  DME Oxygen  Once    Question Answer Comment  Length of Need Lifetime   Mode or (Route) Nasal cannula   Liters per Minute 2   Frequency Continuous (stationary and portable oxygen unit needed)   Oxygen delivery system Gas      12/15/19 1220          Vitals:   12/15/19 0721 12/15/19 1404  BP:  (!) 142/85  Pulse:  97  Resp:    Temp:  98.3 F (36.8 C)  SpO2: 93% 97%    Skin clean, dry and intact without evidence of skin break down, no evidence of skin tears noted. No complaints noted.  An After Visit Summary was printed and given to the patient. Patient escorted via [place], and D/C home via private auto.  Marry Guan  12/15/2019 2:36 PM

## 2019-12-15 NOTE — Progress Notes (Signed)
0900 arrived at patient's room to restart piv. Patient sitting on bedside refusing IV at this time. Stating "I'm going home, I've had enough of this place. Tried to reassure patient, but he continues to refuse IV start.

## 2019-12-16 ENCOUNTER — Telehealth: Payer: Self-pay

## 2019-12-16 NOTE — Telephone Encounter (Signed)
-----   Message from Hollice Espy, MD sent at 12/15/2019  8:33 PM EST ----- Please let Mr. Moskal know that the margins for the kidney cancer were negative, looks like we got it all out!!!!  [place] news.  Hollice Espy, MD

## 2019-12-16 NOTE — Telephone Encounter (Signed)
Patient notified

## 2019-12-16 NOTE — Progress Notes (Signed)
Yes he will need surveillance ct chest/abdo every 6 months for 5 years

## 2019-12-17 LAB — PREPARE RBC (CROSSMATCH)

## 2019-12-18 DIAGNOSIS — I700 Atherosclerosis of aorta: Secondary | ICD-10-CM | POA: Diagnosis not present

## 2019-12-18 DIAGNOSIS — Z79891 Long term (current) use of opiate analgesic: Secondary | ICD-10-CM | POA: Diagnosis not present

## 2019-12-18 DIAGNOSIS — I110 Hypertensive heart disease with heart failure: Secondary | ICD-10-CM | POA: Diagnosis not present

## 2019-12-18 DIAGNOSIS — C641 Malignant neoplasm of right kidney, except renal pelvis: Secondary | ICD-10-CM | POA: Diagnosis not present

## 2019-12-18 DIAGNOSIS — Z9981 Dependence on supplemental oxygen: Secondary | ICD-10-CM | POA: Diagnosis not present

## 2019-12-18 DIAGNOSIS — G4733 Obstructive sleep apnea (adult) (pediatric): Secondary | ICD-10-CM | POA: Diagnosis not present

## 2019-12-18 DIAGNOSIS — I249 Acute ischemic heart disease, unspecified: Secondary | ICD-10-CM | POA: Diagnosis not present

## 2019-12-18 DIAGNOSIS — I2510 Atherosclerotic heart disease of native coronary artery without angina pectoris: Secondary | ICD-10-CM | POA: Diagnosis not present

## 2019-12-18 DIAGNOSIS — E785 Hyperlipidemia, unspecified: Secondary | ICD-10-CM | POA: Diagnosis not present

## 2019-12-18 DIAGNOSIS — Z7951 Long term (current) use of inhaled steroids: Secondary | ICD-10-CM | POA: Diagnosis not present

## 2019-12-18 DIAGNOSIS — Z951 Presence of aortocoronary bypass graft: Secondary | ICD-10-CM | POA: Diagnosis not present

## 2019-12-18 DIAGNOSIS — Z955 Presence of coronary angioplasty implant and graft: Secondary | ICD-10-CM | POA: Diagnosis not present

## 2019-12-18 DIAGNOSIS — Z85820 Personal history of malignant melanoma of skin: Secondary | ICD-10-CM | POA: Diagnosis not present

## 2019-12-18 DIAGNOSIS — Z483 Aftercare following surgery for neoplasm: Secondary | ICD-10-CM | POA: Diagnosis not present

## 2019-12-18 DIAGNOSIS — C3411 Malignant neoplasm of upper lobe, right bronchus or lung: Secondary | ICD-10-CM | POA: Diagnosis not present

## 2019-12-18 DIAGNOSIS — I255 Ischemic cardiomyopathy: Secondary | ICD-10-CM | POA: Diagnosis not present

## 2019-12-18 DIAGNOSIS — J441 Chronic obstructive pulmonary disease with (acute) exacerbation: Secondary | ICD-10-CM | POA: Diagnosis not present

## 2019-12-18 DIAGNOSIS — N200 Calculus of kidney: Secondary | ICD-10-CM | POA: Diagnosis not present

## 2019-12-18 DIAGNOSIS — J9621 Acute and chronic respiratory failure with hypoxia: Secondary | ICD-10-CM | POA: Diagnosis not present

## 2019-12-18 DIAGNOSIS — Z85828 Personal history of other malignant neoplasm of skin: Secondary | ICD-10-CM | POA: Diagnosis not present

## 2019-12-18 DIAGNOSIS — Z7984 Long term (current) use of oral hypoglycemic drugs: Secondary | ICD-10-CM | POA: Diagnosis not present

## 2019-12-18 DIAGNOSIS — I5023 Acute on chronic systolic (congestive) heart failure: Secondary | ICD-10-CM | POA: Diagnosis not present

## 2019-12-18 DIAGNOSIS — Z7982 Long term (current) use of aspirin: Secondary | ICD-10-CM | POA: Diagnosis not present

## 2019-12-22 DIAGNOSIS — I5023 Acute on chronic systolic (congestive) heart failure: Secondary | ICD-10-CM | POA: Diagnosis not present

## 2019-12-22 DIAGNOSIS — Z79891 Long term (current) use of opiate analgesic: Secondary | ICD-10-CM | POA: Diagnosis not present

## 2019-12-22 DIAGNOSIS — I255 Ischemic cardiomyopathy: Secondary | ICD-10-CM | POA: Diagnosis not present

## 2019-12-22 DIAGNOSIS — J9621 Acute and chronic respiratory failure with hypoxia: Secondary | ICD-10-CM | POA: Diagnosis not present

## 2019-12-22 DIAGNOSIS — I2510 Atherosclerotic heart disease of native coronary artery without angina pectoris: Secondary | ICD-10-CM | POA: Diagnosis not present

## 2019-12-22 DIAGNOSIS — Z7951 Long term (current) use of inhaled steroids: Secondary | ICD-10-CM | POA: Diagnosis not present

## 2019-12-22 DIAGNOSIS — C3411 Malignant neoplasm of upper lobe, right bronchus or lung: Secondary | ICD-10-CM | POA: Diagnosis not present

## 2019-12-22 DIAGNOSIS — Z955 Presence of coronary angioplasty implant and graft: Secondary | ICD-10-CM | POA: Diagnosis not present

## 2019-12-22 DIAGNOSIS — C641 Malignant neoplasm of right kidney, except renal pelvis: Secondary | ICD-10-CM | POA: Diagnosis not present

## 2019-12-22 DIAGNOSIS — N200 Calculus of kidney: Secondary | ICD-10-CM | POA: Diagnosis not present

## 2019-12-22 DIAGNOSIS — G4733 Obstructive sleep apnea (adult) (pediatric): Secondary | ICD-10-CM | POA: Diagnosis not present

## 2019-12-22 DIAGNOSIS — Z7982 Long term (current) use of aspirin: Secondary | ICD-10-CM | POA: Diagnosis not present

## 2019-12-22 DIAGNOSIS — Z483 Aftercare following surgery for neoplasm: Secondary | ICD-10-CM | POA: Diagnosis not present

## 2019-12-22 DIAGNOSIS — Z85828 Personal history of other malignant neoplasm of skin: Secondary | ICD-10-CM | POA: Diagnosis not present

## 2019-12-22 DIAGNOSIS — I110 Hypertensive heart disease with heart failure: Secondary | ICD-10-CM | POA: Diagnosis not present

## 2019-12-22 DIAGNOSIS — Z9981 Dependence on supplemental oxygen: Secondary | ICD-10-CM | POA: Diagnosis not present

## 2019-12-22 DIAGNOSIS — Z7984 Long term (current) use of oral hypoglycemic drugs: Secondary | ICD-10-CM | POA: Diagnosis not present

## 2019-12-22 DIAGNOSIS — Z951 Presence of aortocoronary bypass graft: Secondary | ICD-10-CM | POA: Diagnosis not present

## 2019-12-22 DIAGNOSIS — E785 Hyperlipidemia, unspecified: Secondary | ICD-10-CM | POA: Diagnosis not present

## 2019-12-22 DIAGNOSIS — I249 Acute ischemic heart disease, unspecified: Secondary | ICD-10-CM | POA: Diagnosis not present

## 2019-12-22 DIAGNOSIS — J441 Chronic obstructive pulmonary disease with (acute) exacerbation: Secondary | ICD-10-CM | POA: Diagnosis not present

## 2019-12-22 DIAGNOSIS — I700 Atherosclerosis of aorta: Secondary | ICD-10-CM | POA: Diagnosis not present

## 2019-12-22 DIAGNOSIS — Z85820 Personal history of malignant melanoma of skin: Secondary | ICD-10-CM | POA: Diagnosis not present

## 2019-12-26 ENCOUNTER — Other Ambulatory Visit: Payer: Self-pay | Admitting: Cardiovascular Disease

## 2019-12-28 DIAGNOSIS — E119 Type 2 diabetes mellitus without complications: Secondary | ICD-10-CM | POA: Diagnosis not present

## 2019-12-28 DIAGNOSIS — C649 Malignant neoplasm of unspecified kidney, except renal pelvis: Secondary | ICD-10-CM | POA: Diagnosis not present

## 2019-12-28 DIAGNOSIS — C439 Malignant melanoma of skin, unspecified: Secondary | ICD-10-CM | POA: Diagnosis not present

## 2019-12-28 DIAGNOSIS — I10 Essential (primary) hypertension: Secondary | ICD-10-CM | POA: Diagnosis not present

## 2019-12-29 DIAGNOSIS — Z7984 Long term (current) use of oral hypoglycemic drugs: Secondary | ICD-10-CM | POA: Diagnosis not present

## 2019-12-29 DIAGNOSIS — I110 Hypertensive heart disease with heart failure: Secondary | ICD-10-CM | POA: Diagnosis not present

## 2019-12-29 DIAGNOSIS — E785 Hyperlipidemia, unspecified: Secondary | ICD-10-CM | POA: Diagnosis not present

## 2019-12-29 DIAGNOSIS — I700 Atherosclerosis of aorta: Secondary | ICD-10-CM | POA: Diagnosis not present

## 2019-12-29 DIAGNOSIS — C641 Malignant neoplasm of right kidney, except renal pelvis: Secondary | ICD-10-CM | POA: Diagnosis not present

## 2019-12-29 DIAGNOSIS — I2510 Atherosclerotic heart disease of native coronary artery without angina pectoris: Secondary | ICD-10-CM | POA: Diagnosis not present

## 2019-12-29 DIAGNOSIS — Z9981 Dependence on supplemental oxygen: Secondary | ICD-10-CM | POA: Diagnosis not present

## 2019-12-29 DIAGNOSIS — Z85828 Personal history of other malignant neoplasm of skin: Secondary | ICD-10-CM | POA: Diagnosis not present

## 2019-12-29 DIAGNOSIS — I249 Acute ischemic heart disease, unspecified: Secondary | ICD-10-CM | POA: Diagnosis not present

## 2019-12-29 DIAGNOSIS — Z7951 Long term (current) use of inhaled steroids: Secondary | ICD-10-CM | POA: Diagnosis not present

## 2019-12-29 DIAGNOSIS — Z483 Aftercare following surgery for neoplasm: Secondary | ICD-10-CM | POA: Diagnosis not present

## 2019-12-29 DIAGNOSIS — G4733 Obstructive sleep apnea (adult) (pediatric): Secondary | ICD-10-CM | POA: Diagnosis not present

## 2019-12-29 DIAGNOSIS — N200 Calculus of kidney: Secondary | ICD-10-CM | POA: Diagnosis not present

## 2019-12-29 DIAGNOSIS — J441 Chronic obstructive pulmonary disease with (acute) exacerbation: Secondary | ICD-10-CM | POA: Diagnosis not present

## 2019-12-29 DIAGNOSIS — Z951 Presence of aortocoronary bypass graft: Secondary | ICD-10-CM | POA: Diagnosis not present

## 2019-12-29 DIAGNOSIS — C3411 Malignant neoplasm of upper lobe, right bronchus or lung: Secondary | ICD-10-CM | POA: Diagnosis not present

## 2019-12-29 DIAGNOSIS — Z79891 Long term (current) use of opiate analgesic: Secondary | ICD-10-CM | POA: Diagnosis not present

## 2019-12-29 DIAGNOSIS — I255 Ischemic cardiomyopathy: Secondary | ICD-10-CM | POA: Diagnosis not present

## 2019-12-29 DIAGNOSIS — Z85820 Personal history of malignant melanoma of skin: Secondary | ICD-10-CM | POA: Diagnosis not present

## 2019-12-29 DIAGNOSIS — Z7982 Long term (current) use of aspirin: Secondary | ICD-10-CM | POA: Diagnosis not present

## 2019-12-29 DIAGNOSIS — I5023 Acute on chronic systolic (congestive) heart failure: Secondary | ICD-10-CM | POA: Diagnosis not present

## 2019-12-29 DIAGNOSIS — Z955 Presence of coronary angioplasty implant and graft: Secondary | ICD-10-CM | POA: Diagnosis not present

## 2019-12-29 DIAGNOSIS — J9621 Acute and chronic respiratory failure with hypoxia: Secondary | ICD-10-CM | POA: Diagnosis not present

## 2020-01-10 ENCOUNTER — Other Ambulatory Visit: Payer: Self-pay

## 2020-01-10 ENCOUNTER — Ambulatory Visit (INDEPENDENT_AMBULATORY_CARE_PROVIDER_SITE_OTHER): Payer: Medicare Other | Admitting: Physician Assistant

## 2020-01-10 ENCOUNTER — Encounter: Payer: Self-pay | Admitting: Physician Assistant

## 2020-01-10 VITALS — BP 119/77 | HR 74 | Ht 68.0 in | Wt 170.0 lb

## 2020-01-10 DIAGNOSIS — C641 Malignant neoplasm of right kidney, except renal pelvis: Secondary | ICD-10-CM

## 2020-01-10 DIAGNOSIS — C4400 Unspecified malignant neoplasm of skin of lip: Secondary | ICD-10-CM

## 2020-01-10 NOTE — Patient Instructions (Addendum)
Today you saw me in the kidney surgery (urology) office. I work with Dr. [name]. She is the doctor who took out your kidney last month.  Dr. Janese Banks is your cancer doctor (oncologist). She is managing your lung cancer.  You will need imaging (CT scans) of your chest, abdomen, and pelvis every 6 months for the next 5 years to make sure your kidney cancer does not come back. Dr. Janese Banks is going to be in charge of this.  I do not think you are currently seeing anyone for your lip cancer. You need to be seeing someone for your lip cancer. I will work with Dr. Janese Banks to find you someone nearby to see you for this.

## 2020-01-10 NOTE — Progress Notes (Signed)
01/10/2020 3:59 PM   Najee Manninen Fluhr 09/20/1951 [id number]  CC: Postop follow-up  HPI: ELIZANDRO LAURA is a 69 y.o. male smoker with a complex medical and oncologic history who presents today for 4-week postop follow-up s/p robotic assisted laparoscopic right nephrectomy with Dr. [name] for management of right renal cell carcinoma.  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 He is accompanied today by his wife, who contributes to HPI.  Today, he reports feeling well after surgery.  He reports chronic fatigue at baseline that is no worse since surgery. Of note, he states his lip cancer is getting worse.  Patient and wife are confused today regarding his multiple appointments with multiple care teams.  They state that he is no longer seeing [facility] for management of his lip cancer and that someone at [facility] is managing this, however they are unable to further specify.  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 Posey Pronto at [facility].  Per chart review, he was recommended to undergo wide local excision with total lower lip and right upper partial lip resection and bilateral neck dissection and forearm free flap reconstruction with adjuvant (chemo)radiation therapy on 05/28/2019.  As part of his preoperative work-up, he was referred for pulmonology and cardiology clearance.  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 Janese Banks in oncology at [facility].  Work-up of this pulmonary nodule revealed an incidental right RCC, managed via right nephrectomy with Dr. [name] 1 month ago.  Patient continues to smoke.  PMH: Past Medical History:  Diagnosis Date  . Anxiety   . COPD (chronic obstructive pulmonary disease) ([place])   . Coronary artery disease 2006   Acute coronary syndrome in March 2006.   . Diabetes mellitus without complication ([place])     . Dyslipidemia   . Hypertension 10/09/2010   EF 40-45%  . Lung cancer ([place])   . 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 Myocardial infarction ([place])   . Sleep apnea    uses cpap machine    Surgical History: Past Surgical History:  Procedure Laterality Date  . Arterial evalation      no evidence of ICA stenosis  . CARDIAC CATHETERIZATION    . CARDIAC SURGERY    . CORONARY ARTERY BYPASS GRAFT  2006   Post CABG surgery with a LIMA to the LAD, a vein to the diagonal ,,a vein to the the obtuse marginal and vein to the PDA.  [name] CORONARY STENT PLACEMENT  2010  . EAR CYST EXCISION Right 10/05/2018   Procedure: Right Partial pinnectomy;  Surgeon: Izora Gala, MD;  Location: [facility];  Service: ENT;  Laterality: Right;  . GRAFT(S) ANGIOGRAM  10/21/2011   Procedure: GRAFT(S) Cyril Loosen;  Surgeon: Leonie Man, MD;  Location: [facility] CATH LAB;  Service: Cardiovascular;;  . LEFT HEART CATHETERIZATION WITH CORONARY ANGIOGRAM N/A 10/21/2011   Procedure: LEFT HEART CATHETERIZATION WITH CORONARY ANGIOGRAM;  Surgeon: Leonie Man, MD;  Location: [facility] CATH LAB;  Service: Cardiovascular;  Laterality: N/A;  . LYMPH NODE BIOPSY Right 10/05/2018   Procedure: sentinel NODE BIOPSY with right partial modified neck dissection;  Surgeon: Izora Gala, MD;  Location: [facility];  Service: ENT;  Laterality: Right;  . PORTA CATH INSERTION N/A 07/12/2019   Procedure: PORTA CATH INSERTION;  Surgeon: Algernon Huxley, MD;  Location: [facility] CV LAB;  Service: Cardiovascular;  Laterality: N/A;  . ROBOT ASSISTED LAPAROSCOPIC NEPHRECTOMY Right 12/13/2019   Procedure: XI ROBOTIC ASSISTED LAPAROSCOPIC NEPHRECTOMY;  Surgeon: Hollice Espy, MD;  Location: [facility] ORS;  Service: Urology;  Laterality: Right;  . SURGERY OF LIP     SKIN CANCER    Home Medications:  Allergies as of 01/10/2020      Reactions   Codeine Nausea And Vomiting, Other (See Comments)   Pt states when he takes codeine, he throws up blood   Niacin And  Related Other (See Comments)   "made his body feel hot & he had a hard time breathing"      Medication List       Accurate as of January 10, 2020  3:59 PM. If you have any questions, ask your nurse or doctor.        STOP taking these medications   acetaminophen 325 MG tablet Commonly known as: TYLENOL Stopped by: Debroah Loop, PA-C   ALPRAZolam 0.25 MG tablet Commonly known as: XANAX Stopped by: Debroah Loop, PA-C   famotidine 20 MG tablet Commonly known as: PEPCID Stopped by: Debroah Loop, PA-C   nicotine 21 mg/24hr patch Commonly known as: NICODERM CQ - dosed in mg/24 hours Stopped by: Debroah Loop, PA-C     TAKE these medications   Advair Diskus 500-50 MCG/DOSE Aepb Generic drug: Fluticasone-Salmeterol Inhale 1 puff into the lungs 2 (two) times daily.   amLODipine 10 MG tablet Commonly known as: NORVASC Take 10 mg by mouth every morning.   aspirin EC 81 MG tablet Take 81 mg by mouth daily.   atorvastatin 40 MG tablet Commonly known as: LIPITOR TAKE 1 TABLET (40 MG TOTAL) BY MOUTH AT BEDTIME. KEEP OCTOBER APPOINTMENT What changed: additional instructions   bisoprolol 5 MG tablet Commonly known as: ZEBETA Take 0.5 tablets (2.5 mg total) by mouth daily for 7 days, THEN 1 tablet (5 mg total) daily. Start taking on: October 27, 2019   busPIRone 7.5 MG tablet Commonly known as: BUSPAR Take 7.5 mg by mouth 2 (two) times daily.   Combivent Respimat 20-100 MCG/ACT Aers respimat Generic drug: Ipratropium-Albuterol Inhale 1 puff into the lungs every 6 (six) hours as needed for wheezing or shortness of breath.   ezetimibe 10 MG tablet Commonly known as: ZETIA TAKE 1 TABLET BY MOUTH EVERY DAY   Incruse Ellipta 62.5 MCG/INH Aepb Generic drug: umeclidinium bromide Inhale 1 puff into the lungs daily.   isosorbide mononitrate 60 MG 24 hr tablet Commonly known as: IMDUR TAKE 1 TABLET BY MOUTH EVERY DAY   losartan 50 MG  tablet Commonly known as: Cozaar Take 1 tablet (50 mg total) by mouth daily.   metFORMIN 500 MG tablet Commonly known as: GLUCOPHAGE TAKE 1 TABLET (500 MG TOTAL) BY MOUTH 2 (TWO) TIMES DAILY WITH A MEAL. What changed: when to take this   opium-belladonna 16.2-60 MG suppository Commonly known as: B&O SUPPRETTES Place 1 suppository rectally every 6 (six) hours as needed for bladder spasms.   oxybutynin 5 MG tablet Commonly known as: DITROPAN Take 1 tablet (5 mg total) by mouth every 8 (eight) hours as needed for bladder spasms.   oxyCODONE-acetaminophen 5-325 MG tablet Commonly known as: PERCOCET/ROXICET Take 1-2 tablets by mouth every 4 (four) hours as needed for moderate pain.   ranolazine 1000 MG SR tablet Commonly known as: RANEXA TAKE 1 TABLET BY MOUTH TWICE A DAY   Trelegy Ellipta 100-62.5-25 MCG/INH Aepb Generic drug: Fluticasone-Umeclidin-Vilant Inhale 1 puff into the lungs daily.  Allergies:  Allergies  Allergen Reactions  . Codeine Nausea And Vomiting and Other (See Comments)    Pt states when he takes codeine, he throws up blood  . Niacin And Related Other (See Comments)    "made his body feel hot & he had a hard time breathing"    Family History: Family History  Problem Relation Age of Onset  . Hypertension Mother   . Heart attack Mother   . Heart attack Father 18    Social History:   reports that he has been smoking cigarettes. He has a 100.00 pack-year smoking history. He quit smokeless tobacco use about 6 years ago.  His smokeless tobacco use included chew. He reports previous drug use. He reports that he does not drink alcohol.  Physical Exam: BP 119/77   Pulse 74   Ht 5\' 8"  (1.727 m)   Wt 170 lb (77.1 kg)   BMI 25.85 kg/m   Constitutional:  Alert and oriented, no acute distress, nontoxic appearing HEENT: [place], AT Cardiovascular: No clubbing, cyanosis, or edema Respiratory: Normal respiratory effort, no increased work of breathing GU:  Well-healing surgical incisions noted over the anterior abdomen without erythema or drainage. Neurologic: Grossly intact, no focal deficits, moving all 4 extremities Psychiatric: Normal mood and affect  Laboratory Data: Surgical pathology  Result Value Ref Range   SURGICAL PATHOLOGY      SURGICAL PATHOLOGY CASE: [id number] PATIENT: Kylor Lavigne Surgical Pathology Report     Specimen Submitted: A. Kidney, right  Clinical History: Right renal cell carcinoma     DIAGNOSIS: A.  KIDNEY, RIGHT; RADICAL NEPHRECTOMY: - CLEAR-CELL RENAL CELL CARCINOMA. - SEE CANCER SUMMARY BELOW. - PREVIOUS BIOPSY SITE. - 3.5 CM SIMPLE RENAL CYST IN SUPERIOR POLE.  CANCER CASE SUMMARY: KIDNEY Procedure: Radical nephrectomy Specimen Laterality: Right Tumor Size: Greatest dimension 9.5 cm Tumor Focality: Unifocal Histologic Type: Clear cell renal cell carcinoma Sarcomatoid Features: Not identified Rhabdoid Features: Present Histologic Grade (WHO / ISUP Grade): G4 Tumor Necrosis: Present (5% microscopic / <5% macroscopic) Tumor Extension: Tumor extension into renal sinus Margins: Uninvolved by invasive carcinoma Lymphovascular Invasion: Present Regional Lymph Nodes: No lymph nodes submitted or found Pathologic Stage Classification (pTNM, AJCC 8th Edition): p T3a pNx Pathologic Findings in Non-neoplastic Kidney: arterionephrosclerosis with 15% global glomerulosclerosis, PAS stain highlights mild thickening of glomerular basement membrane, focal iron deposition in tubular epithelium, focal mild chronic interstitial nephritis  Comment: PAS stain control worked appropriately.  GROSS DESCRIPTION: A. Labeled: Right kidney Received: Formalin Type of procedure: Radical nephrectomy Laterality: Right Weight of specimen: 945 grams (post formalin fixation) Size of specimen: Received is a radical nephrectomy specimen with an overall measurement of 18.5 x 11.8 x 8.5 cm.  The specimen consists of  a 14.8 x 9.5 x 6.2 cm kidney with surrounding perirenal fat measuring up to 2.0 cm in thickness.  Extending from the hilum is a 12.0 cm in length by 0.3 cm in diameter portion of ureter as well as hilar vasculature (renal arteries and renal vein) measuring up to 2.2 cm in length and ranging from 0.5 to 1.3 cm in diameter. Orientation: The s pecimen is received unoriented; however, is able to be oriented at the time of grossing.  The radial perirenal fat resection margin is over inked black. Presence/absence of adrenal gland: Surgically absent Number of masses: 1 Size(s) of mass(es): 9.5 x 8.7 x 6.9 cm Location of mass(es): The mass involves the entire inferior pole with extension to involve the mid pole. Description of tumor:  Sectioning displays a well-circumscribed mass with a heterogeneous, yellow-orange cut surface displaying hemorrhage and necrosis. Extent of invasion:      Peri-nephric tissue (beyond renal capsule): The mass abuts the renal capsule with possible extension into the perirenal fat.     Renal sinus: The mass markedly distorts and obliterates the renal sinus with possible involvement.  Minimal, grossly identifiable renal sinus is present involving the superior pole of the kidney.     Beyond Gerota's fascia: The mass does not grossly appear to extend to or involve Gerota's fascia.     Ma jor vein: The mass abuts the renal vein; however, does not grossly appear to involve.     Perihilar fat: The mass abuts the perihilar fat with possible involvement.      Pelvicalyceal system: The mass markedly distorts and obliterates the renal pelvis with possible involvement.  Minimal, grossly identifiable renal pelvis is present of which abuts the mass and is tan and striated. Adrenal gland: Surgically absent Margins: Ureter resection margin - 12.5 cm, hilar renal arteries resection margin - 2.2 cm, hilar renal vein resection margin - 1.5 cm, and radial perirenal fat resection  margin - 0.1 cm. Description of kidney away from tumor: Sectioning the uninvolved, superior pole of the kidney displays a 3.5 x 3.0 x 2.0 cm unilocular, smooth walled cyst containing clear fluid.  The remaining, uninvolved kidney parenchyma is tan-brown and displays a distinct cortical medullary junction.  No additional abnormalities or mass lesions are grossly identified. Lymph nodes: Palpat ion of the perihilar and perirenal fat displays no lymph node candidates.  Block summary: 1 - ureter and hilar vasculature (renal arteries and renal vein) resection margins (en face) 2-4 - mass displaying possible extension into and involvement of the perirenal fat 5-6 - mass in relation to renal capsule and closest radial perirenal fat resection margin (perpendicular) 7-11 - mass in relation to possibly obliterated and involved renal sinus 12-13 - mass in relation to perihilar fat and ureter 14-15 - mass in relation to renal vein 16-17 - mass in relation to grossly identifiable renal pelvis and renal sinus 18 - mass in relation to uninvolved renal parenchyma 19 - central mass displaying heterogeneous cut surface 20 - superior pole unilocular, smooth walled cyst 21 - grossly normal renal parenchyma  Final Diagnosis performed by Quay Burow, MD.   Electronically signed 12/15/2019 4:48:19PM The electronic signature indicates that the named Attending Pathologist has  evaluated the specimen Technical component performed at [facility], [address], [place], [place] [address] Lab: [phone] Dir: Rush Farmer, MD, MMM  Professional component performed at [facility], [facility], [place], [place], [place] [address] Lab: [phone] Dir: Dellia Nims. Reuel Derby, MD    Assessment & Plan:   69 year old comorbid male with 3 oncologic primaries presents today for 4-week follow-up s/p right nephrectomy for management of RCC. 1. Renal cell carcinoma of right kidney (HCC) Clear margins  per surgical pathology.  Recommend surveillance CTs of the chest, abdomen, and pelvis every 6 months for 5 years for monitoring of recurrence.  Will defer to Dr. Janese Banks of oncology for management of this, as she continues to manage his lung cancer.  2. Skin cancer of lip It appears patient never followed up with [facility] as previously intended due to subsequent findings of 2 additional primary malignancies.  Working with Dr. Janese Banks to identify possible providers at/near [facility] for management of this; if unsuccessful, patient will need to return to [facility] for this.  Return if symptoms  worsen or fail to improve.  Debroah Loop, PA-C  [facility] [address], [place], [place] [address] [phone]

## 2020-01-12 ENCOUNTER — Other Ambulatory Visit: Payer: Self-pay

## 2020-01-12 ENCOUNTER — Ambulatory Visit
Admission: RE | Admit: 2020-01-12 | Discharge: 2020-01-12 | Disposition: A | Discharge status: Home / Self Care | Payer: Medicare Other | Source: Ambulatory Visit | Attending: Oncology | Admitting: Oncology

## 2020-01-12 ENCOUNTER — Other Ambulatory Visit: Payer: Self-pay | Admitting: Oncology

## 2020-01-12 ENCOUNTER — Inpatient Hospital Stay: Payer: Medicare Other | Attending: Oncology

## 2020-01-12 DIAGNOSIS — D3502 Benign neoplasm of left adrenal gland: Secondary | ICD-10-CM | POA: Diagnosis not present

## 2020-01-12 DIAGNOSIS — D3A8 Other benign neuroendocrine tumors: Secondary | ICD-10-CM | POA: Diagnosis not present

## 2020-01-12 DIAGNOSIS — C3411 Malignant neoplasm of upper lobe, right bronchus or lung: Secondary | ICD-10-CM | POA: Insufficient documentation

## 2020-01-12 DIAGNOSIS — C7A8 Other malignant neuroendocrine tumors: Secondary | ICD-10-CM | POA: Diagnosis not present

## 2020-01-12 DIAGNOSIS — C641 Malignant neoplasm of right kidney, except renal pelvis: Secondary | ICD-10-CM | POA: Insufficient documentation

## 2020-01-12 DIAGNOSIS — C4400 Unspecified malignant neoplasm of skin of lip: Secondary | ICD-10-CM | POA: Diagnosis not present

## 2020-01-12 DIAGNOSIS — Z452 Encounter for adjustment and management of vascular access device: Secondary | ICD-10-CM | POA: Diagnosis not present

## 2020-01-12 DIAGNOSIS — C4402 Squamous cell carcinoma of skin of lip: Secondary | ICD-10-CM | POA: Diagnosis not present

## 2020-01-12 HISTORY — DX: Malignant neoplasm of upper lobe, right bronchus or lung: C34.11

## 2020-01-12 LAB — CBC WITH DIFFERENTIAL/PLATELET
Abs Immature Granulocytes: 0.06 10*3/uL (ref 0.00–0.07)
Basophils Absolute: 0.1 10*3/uL (ref 0.0–0.1)
Basophils Relative: 1 %
Eosinophils Absolute: 0.3 10*3/uL (ref 0.0–0.5)
Eosinophils Relative: 2 %
HCT: 34.1 % — ABNORMAL LOW (ref 39.0–52.0)
Hemoglobin: 10.8 g/dL — ABNORMAL LOW (ref 13.0–17.0)
Immature Granulocytes: 0 %
Lymphocytes Relative: 11 %
Lymphs Abs: 1.5 10*3/uL (ref 0.7–4.0)
MCH: 28.9 pg (ref 26.0–34.0)
MCHC: 31.7 g/dL (ref 30.0–36.0)
MCV: 91.2 fL (ref 80.0–100.0)
Monocytes Absolute: 1.2 10*3/uL — ABNORMAL HIGH (ref 0.1–1.0)
Monocytes Relative: 9 %
Neutro Abs: 10.4 10*3/uL — ABNORMAL HIGH (ref 1.7–7.7)
Neutrophils Relative %: 77 %
Platelets: 290 10*3/uL (ref 150–400)
RBC: 3.74 MIL/uL — ABNORMAL LOW (ref 4.22–5.81)
RDW: 15.2 % (ref 11.5–15.5)
WBC: 13.7 10*3/uL — ABNORMAL HIGH (ref 4.0–10.5)
nRBC: 0 % (ref 0.0–0.2)

## 2020-01-12 LAB — COMPREHENSIVE METABOLIC PANEL
ALT: 11 U/L (ref 0–44)
AST: 15 U/L (ref 15–41)
Albumin: 3.5 g/dL (ref 3.5–5.0)
Alkaline Phosphatase: 92 U/L (ref 38–126)
Anion gap: 8 (ref 5–15)
BUN: 13 mg/dL (ref 8–23)
CO2: 24 mmol/L (ref 22–32)
Calcium: 8.8 mg/dL — ABNORMAL LOW (ref 8.9–10.3)
Chloride: 102 mmol/L (ref 98–111)
Creatinine, Ser: 1.71 mg/dL — ABNORMAL HIGH (ref 0.61–1.24)
GFR calc Af Amer: 46 mL/min — ABNORMAL LOW (ref >60)
GFR calc non Af Amer: 40 mL/min — ABNORMAL LOW (ref >60)
Glucose, Bld: 118 mg/dL — ABNORMAL HIGH (ref 70–99)
Potassium: 4.2 mmol/L (ref 3.5–5.1)
Sodium: 134 mmol/L — ABNORMAL LOW (ref 135–145)
Total Bilirubin: 0.5 mg/dL (ref 0.3–1.2)
Total Protein: 7.2 g/dL (ref 6.5–8.1)

## 2020-01-12 MED ORDER — HEPARIN SOD (PORK) LOCK FLUSH 100 UNIT/ML IV SOLN
INTRAVENOUS | Status: AC
  Filled 2020-01-12: qty 5

## 2020-01-12 MED ORDER — HEPARIN SOD (PORK) LOCK FLUSH 100 UNIT/ML IV SOLN
500.0000 [IU] | Freq: Once | INTRAVENOUS | Status: AC
  Administered 2020-01-12: 500 [IU] via INTRAVENOUS
  Filled 2020-01-12: qty 5

## 2020-01-12 MED ORDER — SODIUM CHLORIDE 0.9% FLUSH
10.0000 mL | INTRAVENOUS | Status: DC | PRN
  Administered 2020-01-12: 10 mL via INTRAVENOUS
  Filled 2020-01-12: qty 10

## 2020-01-12 MED ORDER — IOHEXOL 300 MG/ML  SOLN
75.0000 mL | Freq: Once | INTRAMUSCULAR | Status: AC | PRN
  Administered 2020-01-12: 75 mL via INTRAVENOUS

## 2020-01-12 NOTE — Progress Notes (Signed)
Port accessed, flushes without difficulty, no pain, redness or swelling noted, no blood retun noted, Dr. Janese Banks aware, and no new orders at this time. Pt sent to CT scan with port accessed, Pt educated if port unable to be deaccessed after scan to report back to clinic prior to leaving campus. Pt verbalizes understanding.

## 2020-01-12 NOTE — Progress Notes (Signed)
Tumor board

## 2020-01-14 ENCOUNTER — Inpatient Hospital Stay: Payer: Medicare Other | Admitting: Oncology

## 2020-01-15 DIAGNOSIS — J449 Chronic obstructive pulmonary disease, unspecified: Secondary | ICD-10-CM | POA: Diagnosis not present

## 2020-01-20 ENCOUNTER — Encounter: Payer: Self-pay | Admitting: Oncology

## 2020-01-20 ENCOUNTER — Other Ambulatory Visit: Payer: Self-pay

## 2020-01-20 ENCOUNTER — Inpatient Hospital Stay: Payer: Medicare Other | Attending: Oncology | Admitting: Oncology

## 2020-01-20 ENCOUNTER — Other Ambulatory Visit: Payer: Medicare Other

## 2020-01-20 VITALS — BP 99/68 | HR 77 | Temp 95.6°F | Resp 20 | Wt 169.0 lb

## 2020-01-20 DIAGNOSIS — I252 Old myocardial infarction: Secondary | ICD-10-CM | POA: Insufficient documentation

## 2020-01-20 DIAGNOSIS — Z7984 Long term (current) use of oral hypoglycemic drugs: Secondary | ICD-10-CM | POA: Insufficient documentation

## 2020-01-20 DIAGNOSIS — C7A8 Other malignant neuroendocrine tumors: Secondary | ICD-10-CM | POA: Diagnosis not present

## 2020-01-20 DIAGNOSIS — Z79899 Other long term (current) drug therapy: Secondary | ICD-10-CM | POA: Diagnosis not present

## 2020-01-20 DIAGNOSIS — Z905 Acquired absence of kidney: Secondary | ICD-10-CM | POA: Insufficient documentation

## 2020-01-20 DIAGNOSIS — I2510 Atherosclerotic heart disease of native coronary artery without angina pectoris: Secondary | ICD-10-CM | POA: Insufficient documentation

## 2020-01-20 DIAGNOSIS — E785 Hyperlipidemia, unspecified: Secondary | ICD-10-CM | POA: Insufficient documentation

## 2020-01-20 DIAGNOSIS — Z7982 Long term (current) use of aspirin: Secondary | ICD-10-CM | POA: Insufficient documentation

## 2020-01-20 DIAGNOSIS — J449 Chronic obstructive pulmonary disease, unspecified: Secondary | ICD-10-CM | POA: Insufficient documentation

## 2020-01-20 DIAGNOSIS — C4400 Unspecified malignant neoplasm of skin of lip: Secondary | ICD-10-CM

## 2020-01-20 DIAGNOSIS — R5383 Other fatigue: Secondary | ICD-10-CM | POA: Diagnosis not present

## 2020-01-20 DIAGNOSIS — F17210 Nicotine dependence, cigarettes, uncomplicated: Secondary | ICD-10-CM | POA: Diagnosis not present

## 2020-01-20 DIAGNOSIS — I10 Essential (primary) hypertension: Secondary | ICD-10-CM | POA: Insufficient documentation

## 2020-01-20 DIAGNOSIS — C3411 Malignant neoplasm of upper lobe, right bronchus or lung: Secondary | ICD-10-CM | POA: Diagnosis not present

## 2020-01-20 DIAGNOSIS — E119 Type 2 diabetes mellitus without complications: Secondary | ICD-10-CM | POA: Diagnosis not present

## 2020-01-20 DIAGNOSIS — C001 Malignant neoplasm of external lower lip: Secondary | ICD-10-CM | POA: Insufficient documentation

## 2020-01-20 DIAGNOSIS — C641 Malignant neoplasm of right kidney, except renal pelvis: Secondary | ICD-10-CM | POA: Diagnosis not present

## 2020-01-20 DIAGNOSIS — N179 Acute kidney failure, unspecified: Secondary | ICD-10-CM | POA: Insufficient documentation

## 2020-01-20 DIAGNOSIS — R5381 Other malaise: Secondary | ICD-10-CM | POA: Insufficient documentation

## 2020-01-20 DIAGNOSIS — Z951 Presence of aortocoronary bypass graft: Secondary | ICD-10-CM | POA: Insufficient documentation

## 2020-01-20 DIAGNOSIS — R9389 Abnormal findings on diagnostic imaging of other specified body structures: Secondary | ICD-10-CM | POA: Diagnosis not present

## 2020-01-20 NOTE — Progress Notes (Signed)
Patient is coming in for follow up daughter mentions he is well no complaints

## 2020-01-20 NOTE — Progress Notes (Signed)
Tumor Board Documentation  Andrw Mcguirt Basque was presented by Dr Janese Banks at our Tumor Board on 01/20/2020, which included representatives from medical oncology, radiation oncology, navigation, pathology, radiology, surgical, surgical oncology, internal medicine, research, genetics.  Troy [name] currently presents as a current patient, for discussion with history of the following treatments: active survellience, surgical intervention(s), neoadjuvant chemoradiation.  Additionally, we reviewed previous medical and familial history, history of present illness, and recent lab results along with all available histopathologic and imaging studies. The tumor board considered available treatment options and made the following recommendations: Active surveillance(Repeat CT in 6-8 weeks)    The following procedures/referrals were also placed: No orders of the defined types were placed in this encounter.   Clinical Trial Status: not discussed   Staging used: Not Applicable  National site-specific guidelines   were discussed with respect to the case.  Tumor board is a meeting of clinicians from various specialty areas who evaluate and discuss patients for whom a multidisciplinary approach is being considered. Final determinations in the plan of care are those of the provider(s). The responsibility for follow up of recommendations given during tumor board is that of the provider.   Today's extended care, comprehensive team conference, [place] was not present for the discussion and was not examined.   Multidisciplinary Tumor Board is a multidisciplinary case peer review process.  Decisions discussed in the Multidisciplinary Tumor Board reflect the opinions of the specialists present at the conference without having examined the patient.  Ultimately, treatment and diagnostic decisions rest with the primary provider(s) and the patient.

## 2020-01-21 ENCOUNTER — Inpatient Hospital Stay: Payer: Medicare Other

## 2020-01-21 VITALS — BP 131/74 | HR 66 | Temp 96.6°F | Resp 18

## 2020-01-21 DIAGNOSIS — R5381 Other malaise: Secondary | ICD-10-CM | POA: Diagnosis not present

## 2020-01-21 DIAGNOSIS — C3411 Malignant neoplasm of upper lobe, right bronchus or lung: Secondary | ICD-10-CM | POA: Diagnosis not present

## 2020-01-21 DIAGNOSIS — E785 Hyperlipidemia, unspecified: Secondary | ICD-10-CM | POA: Diagnosis not present

## 2020-01-21 DIAGNOSIS — I252 Old myocardial infarction: Secondary | ICD-10-CM | POA: Diagnosis not present

## 2020-01-21 DIAGNOSIS — Z951 Presence of aortocoronary bypass graft: Secondary | ICD-10-CM | POA: Diagnosis not present

## 2020-01-21 DIAGNOSIS — C001 Malignant neoplasm of external lower lip: Secondary | ICD-10-CM | POA: Diagnosis not present

## 2020-01-21 DIAGNOSIS — I10 Essential (primary) hypertension: Secondary | ICD-10-CM | POA: Diagnosis not present

## 2020-01-21 DIAGNOSIS — Z79899 Other long term (current) drug therapy: Secondary | ICD-10-CM | POA: Diagnosis not present

## 2020-01-21 DIAGNOSIS — N179 Acute kidney failure, unspecified: Secondary | ICD-10-CM | POA: Diagnosis not present

## 2020-01-21 DIAGNOSIS — I2510 Atherosclerotic heart disease of native coronary artery without angina pectoris: Secondary | ICD-10-CM | POA: Diagnosis not present

## 2020-01-21 DIAGNOSIS — Z905 Acquired absence of kidney: Secondary | ICD-10-CM | POA: Diagnosis not present

## 2020-01-21 DIAGNOSIS — Z7984 Long term (current) use of oral hypoglycemic drugs: Secondary | ICD-10-CM | POA: Diagnosis not present

## 2020-01-21 DIAGNOSIS — E119 Type 2 diabetes mellitus without complications: Secondary | ICD-10-CM | POA: Diagnosis not present

## 2020-01-21 DIAGNOSIS — Z7982 Long term (current) use of aspirin: Secondary | ICD-10-CM | POA: Diagnosis not present

## 2020-01-21 DIAGNOSIS — R5383 Other fatigue: Secondary | ICD-10-CM | POA: Diagnosis not present

## 2020-01-21 DIAGNOSIS — C641 Malignant neoplasm of right kidney, except renal pelvis: Secondary | ICD-10-CM | POA: Diagnosis not present

## 2020-01-21 DIAGNOSIS — R9389 Abnormal findings on diagnostic imaging of other specified body structures: Secondary | ICD-10-CM | POA: Diagnosis not present

## 2020-01-21 DIAGNOSIS — J449 Chronic obstructive pulmonary disease, unspecified: Secondary | ICD-10-CM | POA: Diagnosis not present

## 2020-01-21 MED ORDER — SODIUM CHLORIDE 0.9 % IV SOLN
Freq: Once | INTRAVENOUS | Status: AC
  Filled 2020-01-21: qty 250

## 2020-01-21 MED ORDER — HEPARIN SOD (PORK) LOCK FLUSH 100 UNIT/ML IV SOLN
INTRAVENOUS | Status: AC
  Filled 2020-01-21: qty 5

## 2020-01-21 MED ORDER — HEPARIN SOD (PORK) LOCK FLUSH 100 UNIT/ML IV SOLN
500.0000 [IU] | Freq: Once | INTRAVENOUS | Status: AC
  Administered 2020-01-21: 500 [IU] via INTRAVENOUS
  Filled 2020-01-21: qty 5

## 2020-01-21 NOTE — Progress Notes (Signed)
No blood return noted from port. Port flushes without difficulty, no pain, swelling, or redness noted. Per Dr. Janese Banks okay to give scheduled IVFs using port.

## 2020-01-21 NOTE — Progress Notes (Signed)
Hematology/Oncology Consult note [facility]  Telephone:([phone] [phone]  Patient Care Team: [name], MD as PCP - General (Internal Medicine) Troy Sine, MD as PCP - Cardiology (Cardiology)   Name of the patient: Troy [name]  [id number]  Jan 19, 1951   Date of visit: 01/21/20  Diagnosis- 1.  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 As a part of his staging work-up he underwent CT chest as well as CT neck. 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 CT neck was unremarkable however CT chest showed a right upper lobe nodule 2.1 x 1.7 cm in with minimal spiculation as well as a 0.5 cm right lower lobe nodule. No mediastinal or hilar adenopathy was noted. Patient was referred to [facility] for further work-up given his preference. He underwent a PET CT scan on 06/10/2019 which showed a hypermetabolic right upper lobe lung nodule measuring 2 cm. Equivocal right infrahilar hypermetabolism with an SUV of 3.1. 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 Large right kidney mass measuring 9.7 x 9 cm. Patient underwent CT-guided lung biopsy which was consistent with large cell neuroendocrine carcinoma. Patient completedconcurrentchemoradiation with carboplatin and etoposide on 09/28/2019.  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  Tumor necrosis present tumor extension into  renal sinus.  Margins negative.  Lymphovascular invasion positive.  Sarcomatoid features not identified.  [id number]   Interval history-patient continues to feel fatigued.  He is currently bothered by his lip cancer affecting the lower lip.  He has undergone surgery for this in the past but does not wish to go through any surgical intervention at this time.  Patient reports having difficulty chewing and swallowing because of his small size of his mouth from prior lip surgeries.  ECOG PS- 2 Pain scale- 0   Review of systems- Review of Systems  Constitutional: Positive for malaise/fatigue. Negative for chills, fever and weight loss.  HENT: Negative for congestion, ear discharge and nosebleeds.   Eyes: Negative for blurred vision.  Respiratory: Negative for cough, hemoptysis, sputum production, shortness of breath and wheezing.   Cardiovascular: Negative for chest pain, palpitations, orthopnea and claudication.  Gastrointestinal: Negative for abdominal pain, blood in stool, constipation, diarrhea, heartburn, melena, nausea and vomiting.  Genitourinary: Negative for dysuria, flank pain, frequency, hematuria and urgency.  Musculoskeletal: Negative for back pain, joint pain and myalgias.  Skin: Negative for rash.  Neurological: Negative for dizziness, tingling, focal weakness, seizures, weakness and headaches.  Endo/Heme/Allergies: Does not bruise/bleed easily.  Psychiatric/Behavioral: Negative for depression and suicidal ideas. The patient does not have insomnia.       Allergies  Allergen Reactions  . Codeine Nausea And Vomiting and Other (See Comments)    Pt states when he takes codeine, he throws up blood  . Niacin And Related Other (See Comments)    "made his body feel hot & he had a hard time breathing"  Past Medical History:  Diagnosis Date  . Anxiety   . COPD (chronic obstructive pulmonary disease) ([place])   . Coronary artery disease 2006   Acute coronary syndrome in March 2006.    . Diabetes mellitus without complication ([place])   . Dyslipidemia   . Hypertension 10/09/2010   EF 40-45%  . Malignant neoplasm of upper lobe of right lung (HCC)    Chemo + rad tx's.   . 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 Myocardial infarction ([place])   . Renal cancer, right ([place]) 11/2019   Nephrectomy.  . Sleep apnea    uses cpap machine     Past Surgical History:  Procedure Laterality Date  . Arterial evalation      no evidence of ICA stenosis  . CARDIAC CATHETERIZATION    . CARDIAC SURGERY    . CORONARY ARTERY BYPASS GRAFT  2006   Post CABG surgery with a LIMA to the LAD, a vein to the diagonal ,,a vein to the the obtuse marginal and vein to the PDA.  [name] CORONARY STENT PLACEMENT  2010  . EAR CYST EXCISION Right 10/05/2018   Procedure: Right Partial pinnectomy;  Surgeon: Izora Gala, MD;  Location: [place];  Service: ENT;  Laterality: Right;  . GRAFT(S) ANGIOGRAM  10/21/2011   Procedure: GRAFT(S) Cyril Loosen;  Surgeon: Leonie Man, MD;  Location: [facility] CATH LAB;  Service: Cardiovascular;;  . LEFT HEART CATHETERIZATION WITH CORONARY ANGIOGRAM N/A 10/21/2011   Procedure: LEFT HEART CATHETERIZATION WITH CORONARY ANGIOGRAM;  Surgeon: Leonie Man, MD;  Location: [facility] CATH LAB;  Service: Cardiovascular;  Laterality: N/A;  . LYMPH NODE BIOPSY Right 10/05/2018   Procedure: sentinel NODE BIOPSY with right partial modified neck dissection;  Surgeon: Izora Gala, MD;  Location: [place];  Service: ENT;  Laterality: Right;  . PORTA CATH INSERTION N/A 07/12/2019   Procedure: PORTA CATH INSERTION;  Surgeon: Algernon Huxley, MD;  Location: [facility] CV LAB;  Service: Cardiovascular;  Laterality: N/A;  . ROBOT ASSISTED LAPAROSCOPIC NEPHRECTOMY Right 12/13/2019   Procedure: XI ROBOTIC ASSISTED LAPAROSCOPIC NEPHRECTOMY;  Surgeon: Hollice Espy, MD;  Location: [facility] ORS;  Service: Urology;  Laterality: Right;  . SURGERY OF LIP     SKIN CANCER    Social History   Socioeconomic History  .  Marital status: Married    Spouse name: JoAnne Kruzel  . Number of children: Not on file  . Years of education: Not on file  . Highest education level: Not on file  Occupational History  . Not on file  Tobacco Use  . Smoking status: Current Every Day Smoker    Packs/day: 2.00    Years: 50.00    Pack years: 100.00    Types: Cigarettes  . Smokeless tobacco: Former Systems developer    Types: [place] date: 01/23/2013  Substance and Sexual Activity  . Alcohol use: No  . Drug use: Not Currently  . Sexual activity: Not Currently  Other Topics Concern  . Not on file  Social History Narrative   Lives with son and grandson   Social Determinants of Health   Financial Resource Strain:   . Difficulty of Paying Living Expenses:   Food Insecurity:   . Worried About Charity fundraiser in the Last Year:   . Arboriculturist in the Last Year:   Transportation Needs:   . Film/video editor (Medical):   [name] Lack of Transportation (Non-Medical):   Physical Activity:   .  Days of Exercise per Week:   . Minutes of Exercise per Session:   Stress:   . Feeling of Stress :   Social Connections:   . Frequency of Communication with Friends and Family:   . Frequency of Social Gatherings with Friends and Family:   . Attends Religious Services:   . Active Member of Clubs or Organizations:   . Attends Archivist Meetings:   [name] Marital Status:   Intimate Partner Violence:   . Fear of Current or Ex-Partner:   . Emotionally Abused:   [name] Physically Abused:   . Sexually Abused:     Family History  Problem Relation Age of Onset  . Hypertension Mother   . Heart attack Mother   . Heart attack Father 52     Current Outpatient Medications:  .  amLODipine (NORVASC) 10 MG tablet, Take 10 mg by mouth every morning., Disp: , Rfl:  .  aspirin EC 81 MG tablet, Take 81 mg by mouth daily., Disp: , Rfl:  .  atorvastatin (LIPITOR) 40 MG tablet, TAKE 1 TABLET (40 MG TOTAL) BY MOUTH AT BEDTIME. [place] (Patient taking differently: Take 40 mg by mouth at bedtime. ), Disp: 90 tablet, Rfl: 0 .  bisoprolol (ZEBETA) 5 MG tablet, Take 0.5 tablets (2.5 mg total) by mouth daily for 7 days, THEN 1 tablet (5 mg total) daily., Disp: 90 tablet, Rfl: 3 .  busPIRone (BUSPAR) 7.5 MG tablet, Take 7.5 mg by mouth 2 (two) times daily. , Disp: , Rfl:  .  ezetimibe (ZETIA) 10 MG tablet, TAKE 1 TABLET BY MOUTH EVERY DAY, Disp: 90 tablet, Rfl: 0 .  Fluticasone-Salmeterol (ADVAIR DISKUS) 500-50 MCG/DOSE AEPB, Inhale 1 puff into the lungs 2 (two) times daily., Disp: , Rfl:  .  Fluticasone-Umeclidin-Vilant (TRELEGY ELLIPTA) 100-62.5-25 MCG/INH AEPB, Inhale 1 puff into the lungs daily., Disp: , Rfl:  .  INCRUSE ELLIPTA 62.5 MCG/INH AEPB, Inhale 1 puff into the lungs daily. , Disp: , Rfl: 2 .  Ipratropium-Albuterol (COMBIVENT RESPIMAT) 20-100 MCG/ACT AERS respimat, Inhale 1 puff into the lungs every 6 (six) hours as needed for wheezing or shortness of breath. , Disp: , Rfl:  .  isosorbide mononitrate (IMDUR) 60 MG 24 hr tablet, TAKE 1 TABLET BY MOUTH EVERY DAY, Disp: 90 tablet, Rfl: 1 .  losartan (COZAAR) 50 MG tablet, Take 1 tablet (50 mg total) by mouth daily., Disp: 30 tablet, Rfl: 11 .  metFORMIN (GLUCOPHAGE) 500 MG tablet, TAKE 1 TABLET (500 MG TOTAL) BY MOUTH 2 (TWO) TIMES DAILY WITH A MEAL. (Patient taking differently: Take 500 mg by mouth daily with breakfast. ), Disp: 180 tablet, Rfl: 0 .  opium-belladonna (B&O SUPPRETTES) 16.2-60 MG suppository, Place 1 suppository rectally every 6 (six) hours as needed for bladder spasms., Disp: 4 suppository, Rfl: 0 .  oxybutynin (DITROPAN) 5 MG tablet, Take 1 tablet (5 mg total) by mouth every 8 (eight) hours as needed for bladder spasms., Disp: 20 tablet, Rfl: 0 .  oxyCODONE-acetaminophen (PERCOCET/ROXICET) 5-325 MG tablet, Take 1-2 tablets by mouth every 4 (four) hours as needed for moderate pain., Disp: 20 tablet, Rfl: 0 .  ranolazine (RANEXA) 1000 MG SR  tablet, TAKE 1 TABLET BY MOUTH TWICE A DAY (Patient taking differently: Take 1,000 mg by mouth 2 (two) times daily. ), Disp: 60 tablet, Rfl: 11 No current facility-administered medications for this visit.  Facility-Administered Medications Ordered in Other Visits:  .  heparin lock flush 100 unit/mL, 500 Units, Intravenous, Once,  Sindy Guadeloupe, MD .  sodium chloride flush (NS) 0.9 % injection 10 mL, 10 mL, Intravenous, PRN, Sindy Guadeloupe, MD .  sodium chloride flush (NS) 0.9 % injection 10 mL, 10 mL, Intravenous, PRN, Sindy Guadeloupe, MD, 10 mL at 08/31/19 1123  Physical exam:  Vitals:   01/20/20 1119  BP: 99/68  Pulse: 77  Resp: 20  Temp: (!) 95.6 F (35.3 C)  TempSrc: Tympanic  SpO2: 98%  Weight: 169 lb (76.7 kg)   Physical Exam Constitutional:      Comments: Appears fatigued  HENT:     Head: Normocephalic and atraumatic.     Ears:     Comments: Right ear pinna is partially resected    Mouth/Throat:     Comments: Uneven lower lip surface from prior squamous cell carcinoma Eyes:     Pupils: Pupils are equal, round, and reactive to light.  Cardiovascular:     Rate and Rhythm: Normal rate and regular rhythm.     Heart sounds: Normal heart sounds.  Pulmonary:     Effort: Pulmonary effort is normal.     Breath sounds: Normal breath sounds.  Abdominal:     General: Bowel sounds are normal.     Palpations: Abdomen is soft.  Musculoskeletal:     Cervical back: Normal range of motion.  Skin:    General: Skin is warm and dry.  Neurological:     Mental Status: He is alert and oriented to person, place, and time.      CMP Latest Ref Rng & Units 01/12/2020  Glucose 70 - 99 mg/dL 118(H)  BUN 8 - 23 mg/dL 13  Creatinine 0.61 - 1.24 mg/dL 1.71(H)  Sodium 135 - 145 mmol/L 134(L)  Potassium 3.5 - 5.1 mmol/L 4.2  Chloride 98 - 111 mmol/L 102  CO2 22 - 32 mmol/L 24  Calcium 8.9 - 10.3 mg/dL 8.8(L)  Total Protein 6.5 - 8.1 g/dL 7.2  Total Bilirubin 0.3 - 1.2 mg/dL 0.5    Alkaline Phos 38 - 126 U/L 92  AST 15 - 41 U/L 15  ALT 0 - 44 U/L 11   CBC Latest Ref Rng & Units 01/12/2020  WBC 4.0 - 10.5 K/uL 13.7(H)  Hemoglobin 13.0 - 17.0 g/dL 10.8(L)  Hematocrit 39.0 - 52.0 % 34.1(L)  Platelets 150 - 400 K/uL 290    No images are attached to the encounter.  CT Chest W Contrast  Result Date: 01/12/2020 CLINICAL DATA:  Primary Cancer Type: Lung Imaging Indication: Assess response to therapy Interval therapy since last imaging? No - completed chemoradiation therapy 09/28/19 Initial Cancer Diagnosis Date: 06/23/2019  Established by: Biopsy-proven Detailed Pathology: Large-cell neuroendocrine carcinoma Primary Tumor location: Right upper lung lobe Radiation therapy? Yes Date Range: 09/28/2019 Target: Right upper lobe pulmonary nodule Other History: Clear cell right renal cell carcinoma status post right nephrectomy 12/13/2019. Melanoma of the right ear resected December 2019. 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 EXAM: CT CHEST, ABDOMEN, AND PELVIS WITH CONTRAST TECHNIQUE: Multidetector CT imaging of the chest, abdomen and pelvis was performed following the standard protocol during bolus administration of intravenous contrast. CONTRAST:  70mL OMNIPAQUE IOHEXOL 300 MG/ML  SOLN COMPARISON:  10/14/2019 CT chest, abdomen and pelvis. 07/28/2019 MRI abdomen. 06/10/2019 PET-CT. FINDINGS: CT CHEST FINDINGS Cardiovascular: Normal heart size. No significant pericardial effusion/thickening. Left main and 3 vessel coronary atherosclerosis status post CABG. Left internal jugular Port-A-Cath terminates in the left brachiocephalic vein. Atherosclerotic nonaneurysmal thoracic aorta. Normal caliber  pulmonary arteries. No central pulmonary emboli. Mediastinum/Nodes: No discrete thyroid nodules. Unremarkable esophagus. No pathologically enlarged axillary, mediastinal or hilar lymph nodes. Lungs/Pleura: No pneumothorax. No pleural effusion. Moderate centrilobular and  paraseptal emphysema with diffuse bronchial wall thickening. Masslike consolidation in the peripheral right upper lobe measures 4.8 x 2.0 cm (series 3/image 70), previously 4.7 x 1.5 cm, mildly increased. Numerous (greater than 10) solid pulmonary nodules scattered throughout both lungs, most of which have mildly increased in size and a few of which are new. Representative new 0.9 cm superior segment right lower lobe nodule (series 3/image 70). Representative medial right lower lobe 1.0 cm nodule (series 3/image 93), increased from 0.6 cm. Representative new 0.4 cm medial basilar left lower lobe nodule (series 3/image 131). Representative 0.5 cm anterior left lower lobe nodule (series 3/image 92), mildly increased from 0.3 cm. Patchy tree-in-bud opacities throughout the right lung have mildly improved. Musculoskeletal: No aggressive appearing focal osseous lesions. Intact sternotomy wires. Mild thoracic spondylosis. CT ABDOMEN PELVIS FINDINGS Hepatobiliary: Normal liver with no liver mass. Normal gallbladder with no radiopaque cholelithiasis. No biliary ductal dilatation. Pancreas: Normal, with no mass or duct dilation. Spleen: Normal size. No mass. Adrenals/Urinary Tract: Stable 2.7 cm left adrenal adenoma as previously characterized on MRI. No discrete right adrenal nodule. Status post interval right nephrectomy. Small amount of crescentic fluid in the right nephrectomy bed (series 2/image 70), with no discrete residual or recurrent mass. No left hydronephrosis. Simple 4.1 cm posterior interpolar left renal cyst. Additional stable scattered subcentimeter hypodense left renal cortical lesions, too small to characterize. No new left renal lesions. Normal bladder. Stomach/Bowel: Normal non-distended stomach. Normal caliber small bowel with no small bowel wall thickening. Normal appendix. Normal large bowel with no diverticulosis, large bowel wall thickening or pericolonic fat stranding. Vascular/Lymphatic:  Atherosclerotic abdominal aorta with stable 3.0 cm infrarenal abdominal aortic aneurysm. Patent portal, splenic, hepatic and left renal veins. No pathologically enlarged lymph nodes in the abdomen or pelvis. Reproductive: Mild prostatomegaly. Other: No pneumoperitoneum, ascites or focal fluid collection. Musculoskeletal: New faintly sclerotic 1.0 cm L3 vertebral lesion (series 6/image 97). No additional focal osseous lesions. IMPRESSION: 1. Numerous (greater than 10) solid pulmonary nodules scattered throughout both lungs, most of which have mildly increased in size and a few of which are new, worrisome for pulmonary metastases. 2. Masslike consolidation in the peripheral right upper lobe is mildly increased, favor evolving postradiation change. 3. New faintly sclerotic L3 vertebral lesion, suspicious for a new osseous metastasis. 4. Interval right nephrectomy with small amount of ill-defined fluid in the nephrectomy bed and no definite residual or recurrent nephrectomy bed tumor. 5. Chronic findings include: Aortic Atherosclerosis (ICD10-I70.0) and Emphysema (ICD10-J43.9). Stable left adrenal adenoma. Stable 3.0 cm infrarenal abdominal aortic aneurysm. Mild prostatomegaly. Electronically Signed   By: Ilona Sorrel M.D.   On: 01/12/2020 10:57   CT ABDOMEN PELVIS W CONTRAST  Result Date: 01/12/2020 CLINICAL DATA:  Primary Cancer Type: Lung Imaging Indication: Assess response to therapy Interval therapy since last imaging? No - completed chemoradiation therapy 09/28/19 Initial Cancer Diagnosis Date: 06/23/2019  Established by: Biopsy-proven Detailed Pathology: Large-cell neuroendocrine carcinoma Primary Tumor location: Right upper lung lobe Radiation therapy? Yes Date Range: 09/28/2019 Target: Right upper lobe pulmonary nodule Other History: Clear cell right renal cell carcinoma status post right nephrectomy 12/13/2019. Melanoma of the right ear resected December 2019. 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 EXAM: CT CHEST, ABDOMEN, AND PELVIS WITH CONTRAST TECHNIQUE: Multidetector CT imaging of the  chest, abdomen and pelvis was performed following the standard protocol during bolus administration of intravenous contrast. CONTRAST:  38mL OMNIPAQUE IOHEXOL 300 MG/ML  SOLN COMPARISON:  10/14/2019 CT chest, abdomen and pelvis. 07/28/2019 MRI abdomen. 06/10/2019 PET-CT. FINDINGS: CT CHEST FINDINGS Cardiovascular: Normal heart size. No significant pericardial effusion/thickening. Left main and 3 vessel coronary atherosclerosis status post CABG. Left internal jugular Port-A-Cath terminates in the left brachiocephalic vein. Atherosclerotic nonaneurysmal thoracic aorta. Normal caliber pulmonary arteries. No central pulmonary emboli. Mediastinum/Nodes: No discrete thyroid nodules. Unremarkable esophagus. No pathologically enlarged axillary, mediastinal or hilar lymph nodes. Lungs/Pleura: No pneumothorax. No pleural effusion. Moderate centrilobular and paraseptal emphysema with diffuse bronchial wall thickening. Masslike consolidation in the peripheral right upper lobe measures 4.8 x 2.0 cm (series 3/image 70), previously 4.7 x 1.5 cm, mildly increased. Numerous (greater than 10) solid pulmonary nodules scattered throughout both lungs, most of which have mildly increased in size and a few of which are new. Representative new 0.9 cm superior segment right lower lobe nodule (series 3/image 70). Representative medial right lower lobe 1.0 cm nodule (series 3/image 93), increased from 0.6 cm. Representative new 0.4 cm medial basilar left lower lobe nodule (series 3/image 131). Representative 0.5 cm anterior left lower lobe nodule (series 3/image 92), mildly increased from 0.3 cm. Patchy tree-in-bud opacities throughout the right lung have mildly improved. Musculoskeletal: No aggressive appearing focal osseous lesions. Intact sternotomy wires. Mild thoracic spondylosis. CT ABDOMEN PELVIS FINDINGS Hepatobiliary:  Normal liver with no liver mass. Normal gallbladder with no radiopaque cholelithiasis. No biliary ductal dilatation. Pancreas: Normal, with no mass or duct dilation. Spleen: Normal size. No mass. Adrenals/Urinary Tract: Stable 2.7 cm left adrenal adenoma as previously characterized on MRI. No discrete right adrenal nodule. Status post interval right nephrectomy. Small amount of crescentic fluid in the right nephrectomy bed (series 2/image 70), with no discrete residual or recurrent mass. No left hydronephrosis. Simple 4.1 cm posterior interpolar left renal cyst. Additional stable scattered subcentimeter hypodense left renal cortical lesions, too small to characterize. No new left renal lesions. Normal bladder. Stomach/Bowel: Normal non-distended stomach. Normal caliber small bowel with no small bowel wall thickening. Normal appendix. Normal large bowel with no diverticulosis, large bowel wall thickening or pericolonic fat stranding. Vascular/Lymphatic: Atherosclerotic abdominal aorta with stable 3.0 cm infrarenal abdominal aortic aneurysm. Patent portal, splenic, hepatic and left renal veins. No pathologically enlarged lymph nodes in the abdomen or pelvis. Reproductive: Mild prostatomegaly. Other: No pneumoperitoneum, ascites or focal fluid collection. Musculoskeletal: New faintly sclerotic 1.0 cm L3 vertebral lesion (series 6/image 97). No additional focal osseous lesions. IMPRESSION: 1. Numerous (greater than 10) solid pulmonary nodules scattered throughout both lungs, most of which have mildly increased in size and a few of which are new, worrisome for pulmonary metastases. 2. Masslike consolidation in the peripheral right upper lobe is mildly increased, favor evolving postradiation change. 3. New faintly sclerotic L3 vertebral lesion, suspicious for a new osseous metastasis. 4. Interval right nephrectomy with small amount of ill-defined fluid in the nephrectomy bed and no definite residual or recurrent  nephrectomy bed tumor. 5. Chronic findings include: Aortic Atherosclerosis (ICD10-I70.0) and Emphysema (ICD10-J43.9). Stable left adrenal adenoma. Stable 3.0 cm infrarenal abdominal aortic aneurysm. Mild prostatomegaly. Electronically Signed   By: Ilona Sorrel M.D.   On: 01/12/2020 10:57     Assessment and plan- Patient is a 69 y.o. male with COPD among other medical problems here for follow-up of following issues:  1.  Stage I large cell neuroendocrine carcinoma of the lung.  He is s/p concurrent chemoradiation.  He had recent CT chest abdomen pelvis which now shows multiple small bilateral lung nodules Which are concerning for malignancy however they are too small to be biopsied and below the PET/CT threshold.  The masslike consolidation in the right upper lobe which was this primary mass is overall stable and shows radiation changes.  It is unclear of these bilateral pulmonary nodule seen on CT scan represent hematogenous spread from his renal cell carcinoma or represent metastatic lung cancer.  At this time they are too small to be biopsied.  I will therefore plan to get a repeat CT chest with contrast in 6 to 8 weeks time.  His scans were reviewed at tumor board as well and that was the consensus  2.  Stage III renal cell carcinoma: S/p resection.  He did have multiple high risk features including grade 4 histology, necrosis, tumor deposits and lymphovascular invasion.  His overall performance status is not conducive for adjuvant sunitinib which showed  improvement in progression free survival but not overall survival.  Plan to get repeat CT chest abdomen and pelvis as above in 6 to 8 weeks  3.  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 Posey Pronto from [facility] in the past initial plan was total lower lip and partial upper lobe resection and bilateral neck dissection.  However patient does not wish to go through any surgery at this time.  He is interested in considering palliative  radiation for the same and I will refer him to radiation oncology for this  4.  AKI: Patient noted to have an elevated creatinine of 1.7 over the last 1 month.  We will plan to bring him in for 1 L of IV fluids this week  I will see him back in 6 weeks time after CT scans are back.  I have encouraged the patient to take his Covid vaccine   Visit Diagnosis 1. Malignant neoplasm of upper lobe of right lung ([place])   2. Skin cancer of lip   3. Large cell neuroendocrine carcinoma ([place])   4. Renal cell carcinoma of right kidney ([place])   5. Abnormal CT scan      Dr. Randa Evens, MD, MPH [facility] at [facility] [phone] 01/21/2020 1:47 PM

## 2020-01-28 ENCOUNTER — Ambulatory Visit: Payer: Medicare Other

## 2020-02-01 ENCOUNTER — Other Ambulatory Visit: Payer: Self-pay

## 2020-02-02 ENCOUNTER — Ambulatory Visit
Admission: RE | Admit: 2020-02-02 | Discharge: 2020-02-02 | Disposition: A | Discharge status: Home / Self Care | Payer: Medicare Other | Source: Ambulatory Visit | Attending: Radiation Oncology | Admitting: Radiation Oncology

## 2020-02-02 DIAGNOSIS — Z85528 Personal history of other malignant neoplasm of kidney: Secondary | ICD-10-CM | POA: Insufficient documentation

## 2020-02-02 DIAGNOSIS — R05 Cough: Secondary | ICD-10-CM | POA: Insufficient documentation

## 2020-02-02 DIAGNOSIS — C4400 Unspecified malignant neoplasm of skin of lip: Secondary | ICD-10-CM

## 2020-02-02 DIAGNOSIS — C7A1 Malignant poorly differentiated neuroendocrine tumors: Secondary | ICD-10-CM | POA: Insufficient documentation

## 2020-02-02 DIAGNOSIS — C001 Malignant neoplasm of external lower lip: Secondary | ICD-10-CM | POA: Insufficient documentation

## 2020-02-02 NOTE — Progress Notes (Signed)
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  He is also been worked up in the past at [facility] for squamous cell carcinoma biopsy-positive of the lower lip.  Pain in his lower lip is significant the patient is seen today for consideration of palliative radiation therapy to his lip.  Unfortunately his most recent CT scans performed back in March showed greater than 10 solid pulmonary nodules scattered throughout both lungs mildly increasing in size worrisome for pulmonary metastasis.  Area of previous radiation in the right upper lobe is mildly increased favoring evolution of post radiation changes.  Also has a sclerotic L3 lesion suspicious for metastasis.  He is doing fairly well he states he has a mild cough mostly nonproductive no hemoptysis.  He has repeat CT scans at the end of May.  COMPLICATIONS OF TREATMENT: none  FOLLOW UP COMPLIANCE: keeps appointments   PHYSICAL EXAM:  BP (P) 102/77 (BP Location: Left Arm, Patient Position: Sitting)   Pulse (P) 75   Temp (!) (P) 94.7 F (34.8 C) (Tympanic)   Resp (P) 16   Wt (P) 170 lb 4.8 oz (77.2 kg)   BMI (P) 25.89 kg/m  Lower lip seems to be totally involved with slightly raised erythematous lesions consistent with squamous cell carcinoma no evidence of submental cervical or  supraclavicular adenopathy is detected.  Well-developed well-nourished patient in NAD. HEENT reveals PERLA, EOMI, discs not visualized.  Oral cavity is clear. No oral mucosal lesions are identified. Neck is clear without evidence of cervical or supraclavicular adenopathy. Lungs are clear to A&P. Cardiac examination is essentially unremarkable with regular rate and rhythm without murmur rub or thrill. Abdomen is benign with no organomegaly or masses noted. Motor sensory and DTR levels are equal and symmetric in the upper and lower extremities. Cranial nerves II through XII are grossly intact. Proprioception is intact. No peripheral adenopathy or edema is identified. No motor or sensory levels are noted. Crude visual fields are within normal range.  RADIOLOGY RESULTS: Recent CT scans chest abdomen pelvis reviewed compatible with above-stated findings  PLAN: At this time based on his CT scans I tried to talk the patient and into waiting until follow-up scans at the end of May.  My thinking is if he has progressive metastatic disease they might want to forego electron-beam therapy to his lower lip.  Patient is wife are both interested in starting treatment have been asked me to go ahead and start electron-beam therapy.  I would plan on delivering 5000 cGy over 5 weeks.  Lip is a tough area to treat with radiation and treated with a more hypofractionated course of treatment would cause significant morbidity.  I have personally set up an ordered CT simulation for tomorrow.  We will try to use some lead shielding behind  his lip to prevent any oral mucositis.  Patient and wife both comprehend my treatment plan well.  I would like to take this opportunity to thank you for allowing me to participate in the care of your patient.Noreene Filbert, MD

## 2020-02-03 ENCOUNTER — Telehealth: Payer: Self-pay | Admitting: *Deleted

## 2020-02-03 ENCOUNTER — Ambulatory Visit
Admission: RE | Admit: 2020-02-03 | Discharge: 2020-02-03 | Disposition: A | Discharge status: Home / Self Care | Payer: Medicare Other | Source: Ambulatory Visit | Attending: Radiation Oncology | Admitting: Radiation Oncology

## 2020-02-03 DIAGNOSIS — C4400 Unspecified malignant neoplasm of skin of lip: Secondary | ICD-10-CM | POA: Insufficient documentation

## 2020-02-03 DIAGNOSIS — Z510 Encounter for antineoplastic radiation therapy: Secondary | ICD-10-CM | POA: Diagnosis not present

## 2020-02-03 DIAGNOSIS — C001 Malignant neoplasm of external lower lip: Secondary | ICD-10-CM | POA: Diagnosis not present

## 2020-02-03 NOTE — Telephone Encounter (Signed)
Patient contacted to assure understanding of education and treatment plan. Patient verbalized understanding and was able to verify future appointment. Patient encouraged to contact Radiation Oncology Department should questions arise.

## 2020-02-07 ENCOUNTER — Other Ambulatory Visit: Payer: Self-pay | Admitting: *Deleted

## 2020-02-07 ENCOUNTER — Encounter: Payer: Self-pay | Admitting: *Deleted

## 2020-02-07 MED ORDER — OXYCODONE HCL 5 MG PO TABS: 5.0000 mg | ORAL_TABLET | Freq: Four times a day (QID) | ORAL | 0 refills | Status: DC | PRN

## 2020-02-07 NOTE — Telephone Encounter (Signed)
I spoke to pt today and Dr. Janese Banks was willing to send him pain med and we sent oxycodone 5 mg 1 tablet Q6 hours and sent to pharmacy and I called the pt to let them know

## 2020-02-08 DIAGNOSIS — Z510 Encounter for antineoplastic radiation therapy: Secondary | ICD-10-CM | POA: Diagnosis not present

## 2020-02-08 DIAGNOSIS — C4400 Unspecified malignant neoplasm of skin of lip: Secondary | ICD-10-CM | POA: Diagnosis not present

## 2020-02-08 DIAGNOSIS — C001 Malignant neoplasm of external lower lip: Secondary | ICD-10-CM | POA: Diagnosis not present

## 2020-02-10 ENCOUNTER — Ambulatory Visit
Admission: RE | Admit: 2020-02-10 | Discharge: 2020-02-10 | Disposition: A | Discharge status: Home / Self Care | Payer: Medicare Other | Source: Ambulatory Visit | Attending: Radiation Oncology | Admitting: Radiation Oncology

## 2020-02-10 DIAGNOSIS — C4400 Unspecified malignant neoplasm of skin of lip: Secondary | ICD-10-CM | POA: Diagnosis not present

## 2020-02-10 DIAGNOSIS — Z510 Encounter for antineoplastic radiation therapy: Secondary | ICD-10-CM | POA: Diagnosis not present

## 2020-02-11 ENCOUNTER — Ambulatory Visit
Admission: RE | Admit: 2020-02-11 | Discharge: 2020-02-11 | Disposition: A | Discharge status: Home / Self Care | Payer: Medicare Other | Source: Ambulatory Visit | Attending: Radiation Oncology | Admitting: Radiation Oncology

## 2020-02-11 DIAGNOSIS — Z510 Encounter for antineoplastic radiation therapy: Secondary | ICD-10-CM | POA: Diagnosis not present

## 2020-02-11 DIAGNOSIS — C4400 Unspecified malignant neoplasm of skin of lip: Secondary | ICD-10-CM | POA: Diagnosis not present

## 2020-02-14 ENCOUNTER — Ambulatory Visit
Admission: RE | Admit: 2020-02-14 | Discharge: 2020-02-14 | Disposition: A | Discharge status: Home / Self Care | Payer: Medicare Other | Source: Ambulatory Visit | Attending: Radiation Oncology | Admitting: Radiation Oncology

## 2020-02-14 DIAGNOSIS — Z510 Encounter for antineoplastic radiation therapy: Secondary | ICD-10-CM | POA: Diagnosis not present

## 2020-02-14 DIAGNOSIS — J449 Chronic obstructive pulmonary disease, unspecified: Secondary | ICD-10-CM | POA: Diagnosis not present

## 2020-02-14 DIAGNOSIS — C4400 Unspecified malignant neoplasm of skin of lip: Secondary | ICD-10-CM | POA: Insufficient documentation

## 2020-02-15 ENCOUNTER — Ambulatory Visit
Admission: RE | Admit: 2020-02-15 | Discharge: 2020-02-15 | Disposition: A | Discharge status: Home / Self Care | Payer: Medicare Other | Source: Ambulatory Visit | Attending: Radiation Oncology | Admitting: Radiation Oncology

## 2020-02-15 DIAGNOSIS — Z510 Encounter for antineoplastic radiation therapy: Secondary | ICD-10-CM | POA: Diagnosis not present

## 2020-02-15 DIAGNOSIS — C4400 Unspecified malignant neoplasm of skin of lip: Secondary | ICD-10-CM | POA: Diagnosis not present

## 2020-02-16 ENCOUNTER — Ambulatory Visit
Admission: RE | Admit: 2020-02-16 | Discharge: 2020-02-16 | Disposition: A | Discharge status: Home / Self Care | Payer: Medicare Other | Source: Ambulatory Visit | Attending: Radiation Oncology | Admitting: Radiation Oncology

## 2020-02-16 DIAGNOSIS — C4400 Unspecified malignant neoplasm of skin of lip: Secondary | ICD-10-CM | POA: Diagnosis not present

## 2020-02-16 DIAGNOSIS — C001 Malignant neoplasm of external lower lip: Secondary | ICD-10-CM | POA: Diagnosis not present

## 2020-02-16 DIAGNOSIS — Z510 Encounter for antineoplastic radiation therapy: Secondary | ICD-10-CM | POA: Diagnosis not present

## 2020-02-17 ENCOUNTER — Ambulatory Visit
Admission: RE | Admit: 2020-02-17 | Discharge: 2020-02-17 | Disposition: A | Discharge status: Home / Self Care | Payer: Medicare Other | Source: Ambulatory Visit | Attending: Radiation Oncology | Admitting: Radiation Oncology

## 2020-02-17 DIAGNOSIS — C4400 Unspecified malignant neoplasm of skin of lip: Secondary | ICD-10-CM | POA: Diagnosis not present

## 2020-02-17 DIAGNOSIS — Z510 Encounter for antineoplastic radiation therapy: Secondary | ICD-10-CM | POA: Diagnosis not present

## 2020-02-18 ENCOUNTER — Ambulatory Visit
Admission: RE | Admit: 2020-02-18 | Discharge: 2020-02-18 | Disposition: A | Discharge status: Home / Self Care | Payer: Medicare Other | Source: Ambulatory Visit | Attending: Radiation Oncology | Admitting: Radiation Oncology

## 2020-02-18 DIAGNOSIS — C4400 Unspecified malignant neoplasm of skin of lip: Secondary | ICD-10-CM | POA: Diagnosis not present

## 2020-02-18 DIAGNOSIS — Z510 Encounter for antineoplastic radiation therapy: Secondary | ICD-10-CM | POA: Diagnosis not present

## 2020-02-21 ENCOUNTER — Ambulatory Visit
Admission: RE | Admit: 2020-02-21 | Discharge: 2020-02-21 | Disposition: A | Discharge status: Home / Self Care | Payer: Medicare Other | Source: Ambulatory Visit | Attending: Radiation Oncology | Admitting: Radiation Oncology

## 2020-02-21 ENCOUNTER — Ambulatory Visit: Payer: Medicare Other | Admitting: Radiation Oncology

## 2020-02-21 DIAGNOSIS — C4400 Unspecified malignant neoplasm of skin of lip: Secondary | ICD-10-CM | POA: Diagnosis not present

## 2020-02-21 DIAGNOSIS — Z510 Encounter for antineoplastic radiation therapy: Secondary | ICD-10-CM | POA: Diagnosis not present

## 2020-02-22 ENCOUNTER — Ambulatory Visit
Admission: RE | Admit: 2020-02-22 | Discharge: 2020-02-22 | Disposition: A | Discharge status: Home / Self Care | Payer: Medicare Other | Source: Ambulatory Visit | Attending: Radiation Oncology | Admitting: Radiation Oncology

## 2020-02-22 DIAGNOSIS — C4400 Unspecified malignant neoplasm of skin of lip: Secondary | ICD-10-CM | POA: Diagnosis not present

## 2020-02-22 DIAGNOSIS — Z510 Encounter for antineoplastic radiation therapy: Secondary | ICD-10-CM | POA: Diagnosis not present

## 2020-02-23 ENCOUNTER — Ambulatory Visit
Admission: RE | Admit: 2020-02-23 | Discharge: 2020-02-23 | Disposition: A | Discharge status: Home / Self Care | Payer: Medicare Other | Source: Ambulatory Visit | Attending: Radiation Oncology | Admitting: Radiation Oncology

## 2020-02-23 DIAGNOSIS — C4400 Unspecified malignant neoplasm of skin of lip: Secondary | ICD-10-CM | POA: Diagnosis not present

## 2020-02-23 DIAGNOSIS — C001 Malignant neoplasm of external lower lip: Secondary | ICD-10-CM | POA: Diagnosis not present

## 2020-02-23 DIAGNOSIS — Z510 Encounter for antineoplastic radiation therapy: Secondary | ICD-10-CM | POA: Diagnosis not present

## 2020-02-24 ENCOUNTER — Ambulatory Visit
Admission: RE | Admit: 2020-02-24 | Discharge: 2020-02-24 | Disposition: A | Discharge status: Home / Self Care | Payer: Medicare Other | Source: Ambulatory Visit | Attending: Radiation Oncology | Admitting: Radiation Oncology

## 2020-02-24 DIAGNOSIS — Z510 Encounter for antineoplastic radiation therapy: Secondary | ICD-10-CM | POA: Diagnosis not present

## 2020-02-24 DIAGNOSIS — C4400 Unspecified malignant neoplasm of skin of lip: Secondary | ICD-10-CM | POA: Diagnosis not present

## 2020-02-25 ENCOUNTER — Ambulatory Visit
Admission: RE | Admit: 2020-02-25 | Discharge: 2020-02-25 | Disposition: A | Discharge status: Home / Self Care | Payer: Medicare Other | Source: Ambulatory Visit | Attending: Radiation Oncology | Admitting: Radiation Oncology

## 2020-02-25 DIAGNOSIS — C4400 Unspecified malignant neoplasm of skin of lip: Secondary | ICD-10-CM | POA: Diagnosis not present

## 2020-02-25 DIAGNOSIS — Z510 Encounter for antineoplastic radiation therapy: Secondary | ICD-10-CM | POA: Diagnosis not present

## 2020-02-28 ENCOUNTER — Ambulatory Visit
Admission: RE | Admit: 2020-02-28 | Discharge: 2020-02-28 | Disposition: A | Discharge status: Home / Self Care | Payer: Medicare Other | Source: Ambulatory Visit | Attending: Radiation Oncology | Admitting: Radiation Oncology

## 2020-02-28 DIAGNOSIS — Z510 Encounter for antineoplastic radiation therapy: Secondary | ICD-10-CM | POA: Diagnosis not present

## 2020-02-28 DIAGNOSIS — C4400 Unspecified malignant neoplasm of skin of lip: Secondary | ICD-10-CM | POA: Diagnosis not present

## 2020-02-29 ENCOUNTER — Telehealth: Payer: Self-pay | Admitting: *Deleted

## 2020-02-29 ENCOUNTER — Inpatient Hospital Stay: Payer: Medicare Other

## 2020-02-29 ENCOUNTER — Encounter: Payer: Self-pay | Admitting: Oncology

## 2020-02-29 ENCOUNTER — Other Ambulatory Visit: Payer: Self-pay

## 2020-02-29 ENCOUNTER — Inpatient Hospital Stay: Payer: Medicare Other | Attending: Oncology | Admitting: Oncology

## 2020-02-29 ENCOUNTER — Ambulatory Visit
Admission: RE | Admit: 2020-02-29 | Discharge: 2020-02-29 | Disposition: A | Discharge status: Home / Self Care | Payer: Medicare Other | Source: Ambulatory Visit | Attending: Radiation Oncology | Admitting: Radiation Oncology

## 2020-02-29 VITALS — BP 110/66 | HR 75 | Temp 97.5°F | Resp 16 | Wt 168.1 lb

## 2020-02-29 DIAGNOSIS — E1122 Type 2 diabetes mellitus with diabetic chronic kidney disease: Secondary | ICD-10-CM | POA: Insufficient documentation

## 2020-02-29 DIAGNOSIS — R0600 Dyspnea, unspecified: Secondary | ICD-10-CM | POA: Diagnosis not present

## 2020-02-29 DIAGNOSIS — Z87891 Personal history of nicotine dependence: Secondary | ICD-10-CM | POA: Diagnosis not present

## 2020-02-29 DIAGNOSIS — C641 Malignant neoplasm of right kidney, except renal pelvis: Secondary | ICD-10-CM | POA: Diagnosis not present

## 2020-02-29 DIAGNOSIS — Z923 Personal history of irradiation: Secondary | ICD-10-CM | POA: Diagnosis not present

## 2020-02-29 DIAGNOSIS — Z79899 Other long term (current) drug therapy: Secondary | ICD-10-CM | POA: Insufficient documentation

## 2020-02-29 DIAGNOSIS — C4400 Unspecified malignant neoplasm of skin of lip: Secondary | ICD-10-CM | POA: Diagnosis not present

## 2020-02-29 DIAGNOSIS — J449 Chronic obstructive pulmonary disease, unspecified: Secondary | ICD-10-CM | POA: Diagnosis not present

## 2020-02-29 DIAGNOSIS — I129 Hypertensive chronic kidney disease with stage 1 through stage 4 chronic kidney disease, or unspecified chronic kidney disease: Secondary | ICD-10-CM | POA: Diagnosis not present

## 2020-02-29 DIAGNOSIS — I2510 Atherosclerotic heart disease of native coronary artery without angina pectoris: Secondary | ICD-10-CM | POA: Insufficient documentation

## 2020-02-29 DIAGNOSIS — Z85819 Personal history of malignant neoplasm of unspecified site of lip, oral cavity, and pharynx: Secondary | ICD-10-CM | POA: Diagnosis not present

## 2020-02-29 DIAGNOSIS — M545 Low back pain: Secondary | ICD-10-CM | POA: Insufficient documentation

## 2020-02-29 DIAGNOSIS — C3411 Malignant neoplasm of upper lobe, right bronchus or lung: Secondary | ICD-10-CM

## 2020-02-29 DIAGNOSIS — Z7982 Long term (current) use of aspirin: Secondary | ICD-10-CM | POA: Insufficient documentation

## 2020-02-29 DIAGNOSIS — E785 Hyperlipidemia, unspecified: Secondary | ICD-10-CM | POA: Diagnosis not present

## 2020-02-29 DIAGNOSIS — R5381 Other malaise: Secondary | ICD-10-CM | POA: Insufficient documentation

## 2020-02-29 DIAGNOSIS — Z905 Acquired absence of kidney: Secondary | ICD-10-CM | POA: Insufficient documentation

## 2020-02-29 DIAGNOSIS — Z7951 Long term (current) use of inhaled steroids: Secondary | ICD-10-CM | POA: Diagnosis not present

## 2020-02-29 DIAGNOSIS — R0789 Other chest pain: Secondary | ICD-10-CM | POA: Insufficient documentation

## 2020-02-29 DIAGNOSIS — I252 Old myocardial infarction: Secondary | ICD-10-CM | POA: Diagnosis not present

## 2020-02-29 DIAGNOSIS — Z9221 Personal history of antineoplastic chemotherapy: Secondary | ICD-10-CM | POA: Insufficient documentation

## 2020-02-29 DIAGNOSIS — N189 Chronic kidney disease, unspecified: Secondary | ICD-10-CM | POA: Insufficient documentation

## 2020-02-29 DIAGNOSIS — R5383 Other fatigue: Secondary | ICD-10-CM | POA: Insufficient documentation

## 2020-02-29 DIAGNOSIS — M79602 Pain in left arm: Secondary | ICD-10-CM | POA: Diagnosis not present

## 2020-02-29 DIAGNOSIS — Z7984 Long term (current) use of oral hypoglycemic drugs: Secondary | ICD-10-CM | POA: Insufficient documentation

## 2020-02-29 DIAGNOSIS — Z510 Encounter for antineoplastic radiation therapy: Secondary | ICD-10-CM | POA: Diagnosis not present

## 2020-02-29 LAB — CBC WITH DIFFERENTIAL/PLATELET
Abs Immature Granulocytes: 0.06 10*3/uL (ref 0.00–0.07)
Basophils Absolute: 0.1 10*3/uL (ref 0.0–0.1)
Basophils Relative: 1 %
Eosinophils Absolute: 0.1 10*3/uL (ref 0.0–0.5)
Eosinophils Relative: 1 %
HCT: 34.9 % — ABNORMAL LOW (ref 39.0–52.0)
Hemoglobin: 11.4 g/dL — ABNORMAL LOW (ref 13.0–17.0)
Immature Granulocytes: 1 %
Lymphocytes Relative: 12 %
Lymphs Abs: 1.5 10*3/uL (ref 0.7–4.0)
MCH: 29.2 pg (ref 26.0–34.0)
MCHC: 32.7 g/dL (ref 30.0–36.0)
MCV: 89.5 fL (ref 80.0–100.0)
Monocytes Absolute: 1.3 10*3/uL — ABNORMAL HIGH (ref 0.1–1.0)
Monocytes Relative: 10 %
Neutro Abs: 9.2 10*3/uL — ABNORMAL HIGH (ref 1.7–7.7)
Neutrophils Relative %: 75 %
Platelets: 272 10*3/uL (ref 150–400)
RBC: 3.9 MIL/uL — ABNORMAL LOW (ref 4.22–5.81)
RDW: 17.4 % — ABNORMAL HIGH (ref 11.5–15.5)
WBC: 12.3 10*3/uL — ABNORMAL HIGH (ref 4.0–10.5)
nRBC: 0 % (ref 0.0–0.2)

## 2020-02-29 LAB — COMPREHENSIVE METABOLIC PANEL
ALT: 14 U/L (ref 0–44)
AST: 15 U/L (ref 15–41)
Albumin: 3.8 g/dL (ref 3.5–5.0)
Alkaline Phosphatase: 111 U/L (ref 38–126)
Anion gap: 8 (ref 5–15)
BUN: 18 mg/dL (ref 8–23)
CO2: 28 mmol/L (ref 22–32)
Calcium: 9.2 mg/dL (ref 8.9–10.3)
Chloride: 100 mmol/L (ref 98–111)
Creatinine, Ser: 1.69 mg/dL — ABNORMAL HIGH (ref 0.61–1.24)
GFR calc Af Amer: 46 mL/min — ABNORMAL LOW (ref >60)
GFR calc non Af Amer: 40 mL/min — ABNORMAL LOW (ref >60)
Glucose, Bld: 106 mg/dL — ABNORMAL HIGH (ref 70–99)
Potassium: 4.5 mmol/L (ref 3.5–5.1)
Sodium: 136 mmol/L (ref 135–145)
Total Bilirubin: 0.8 mg/dL (ref 0.3–1.2)
Total Protein: 7.7 g/dL (ref 6.5–8.1)

## 2020-02-29 MED ORDER — CYCLOBENZAPRINE HCL 5 MG PO TABS: 5.0000 mg | ORAL_TABLET | Freq: Three times a day (TID) | ORAL | 0 refills | Status: DC | PRN

## 2020-02-29 NOTE — Progress Notes (Signed)
Patient here for oncology follow-up appointment, expresses complaints of extreme back pain at times 10/10 that is new onset.

## 2020-02-29 NOTE — Telephone Encounter (Signed)
Called patient to check on his clinical status today. Patient was in clinic yesterday complaining of left arm pain, intermittent dizziness and dyspnea. Patient was offered an appointment with [facility] or advised to go the emergency room he refused both options. Patient states that this morning he is still short of breath but is feeling better. Caller offered to set up an appointment today with [facility] patient stated that he would rather wait and see how he feels when he comes for his scheduled treatment. Caller advised patient to call Radiation Oncology if his conditioned worsened this morning.

## 2020-02-29 NOTE — Telephone Encounter (Signed)
I called him and asked if he wanted to see you today and may need fluids and he said no. His breathing is about the same and he did not need fluids. I told him that if he changes his mind when he gets here that he can tell the nurse in radiation

## 2020-02-29 NOTE — Telephone Encounter (Signed)
Called pt and let him know that we can see him today if would like. By the time we communicated to ana-pt had left. He states he is no different that any other day. He does not need Fluids he said. I told him that if he felt differently when he gets here this afternoon he can let the radiation nurse and she will tell us. He is agreeable to this.

## 2020-02-29 NOTE — Telephone Encounter (Signed)
If he plans to come today I can see him.

## 2020-03-01 ENCOUNTER — Ambulatory Visit
Admission: RE | Admit: 2020-03-01 | Discharge: 2020-03-01 | Disposition: A | Discharge status: Home / Self Care | Payer: Medicare Other | Source: Ambulatory Visit | Attending: Radiation Oncology | Admitting: Radiation Oncology

## 2020-03-01 DIAGNOSIS — Z510 Encounter for antineoplastic radiation therapy: Secondary | ICD-10-CM | POA: Diagnosis not present

## 2020-03-01 DIAGNOSIS — C001 Malignant neoplasm of external lower lip: Secondary | ICD-10-CM | POA: Diagnosis not present

## 2020-03-01 DIAGNOSIS — C4400 Unspecified malignant neoplasm of skin of lip: Secondary | ICD-10-CM | POA: Diagnosis not present

## 2020-03-02 ENCOUNTER — Encounter: Payer: Self-pay | Admitting: Oncology

## 2020-03-02 ENCOUNTER — Ambulatory Visit
Admission: RE | Admit: 2020-03-02 | Discharge: 2020-03-02 | Disposition: A | Discharge status: Home / Self Care | Payer: Medicare Other | Source: Ambulatory Visit | Attending: Radiation Oncology | Admitting: Radiation Oncology

## 2020-03-02 DIAGNOSIS — C4400 Unspecified malignant neoplasm of skin of lip: Secondary | ICD-10-CM | POA: Diagnosis not present

## 2020-03-02 DIAGNOSIS — Z510 Encounter for antineoplastic radiation therapy: Secondary | ICD-10-CM | POA: Diagnosis not present

## 2020-03-02 NOTE — Progress Notes (Signed)
Hematology/Oncology Consult note [facility]  [phone] [phone]  Patient Care Team: [name], MD as PCP - General (Internal Medicine) Troy Sine, MD as PCP - Cardiology (Cardiology)   Name of the patient: Troy [name]  [id number]  04-05-1951   Date of visit: 03/02/20  Diagnosis- 1.  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 As a part of his staging work-up he underwent CT chest as well as CT neck. 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 CT neck was unremarkable however CT chest showed a right upper lobe nodule 2.1 x 1.7 cm in with minimal spiculation as well as a 0.5 cm right lower lobe nodule. No mediastinal or hilar adenopathy was noted. Patient was referred to [facility] for further work-up given his preference. He underwent a PET CT scan on 06/10/2019 which showed a hypermetabolic right upper lobe lung nodule measuring 2 cm. Equivocal right infrahilar hypermetabolism with an SUV of 3.1. 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 Large right kidney mass measuring 9.7 x 9 cm. Patient underwent CT-guided lung biopsy which was consistent with large cell neuroendocrine carcinoma. Patient completedconcurrentchemoradiation with carboplatin and etoposide on 09/28/2019.  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  Tumor necrosis present tumor extension into renal  sinus.  Margins negative.  Lymphovascular invasion positive.  Sarcomatoid features not identified.  [id number]  Interval history- patient has ongoing fatigue. Has baseline exertional SOB for which he uses O2 as needed.   Recently reports hurting his left arm which led to some pain. That pain is better but currently he is having pain in his left lower back. Feels his breathing is at his baseline  ECOG PS- 2 Pain scale- 3 Opioid associated constipation- no  Review of systems- Review of Systems  Constitutional: Positive for malaise/fatigue. Negative for chills, fever and weight loss.  HENT: Negative for congestion, ear discharge and nosebleeds.   Eyes: Negative for blurred vision.  Respiratory: Positive for shortness of breath. Negative for cough, hemoptysis, sputum production and wheezing.        Left chest wall pain  Cardiovascular: Negative for chest pain, palpitations, orthopnea and claudication.  Gastrointestinal: Negative for abdominal pain, blood in stool, constipation, diarrhea, heartburn, melena, nausea and vomiting.  Genitourinary: Negative for dysuria, flank pain, frequency, hematuria and urgency.  Musculoskeletal: Negative for back pain, joint pain and myalgias.  Skin: Negative for rash.  Neurological: Negative for dizziness, tingling, focal weakness, seizures, weakness and headaches.  Endo/Heme/Allergies: Does not bruise/bleed easily.  Psychiatric/Behavioral: Negative for depression and suicidal ideas. The patient does not have insomnia.        Allergies  Allergen Reactions  . Codeine Nausea And Vomiting and Other (See Comments)    Pt states when he takes codeine, he throws up blood  . Niacin And Related Other (See Comments)    "made his body feel hot &  he had a hard time breathing"     Past Medical History:  Diagnosis Date  . Anxiety   . COPD (chronic obstructive pulmonary disease) ([place])   . Coronary artery disease 2006   Acute coronary syndrome in March 2006.   .  Diabetes mellitus without complication ([place])   . Dyslipidemia   . Hypertension 10/09/2010   EF 40-45%  . Malignant neoplasm of upper lobe of right lung ([facility])    Chemo + rad tx's.   . 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 Myocardial infarction ([place])   . Renal cancer, right ([place]) 11/2019   Nephrectomy.  . Sleep apnea    uses cpap machine     Past Surgical History:  Procedure Laterality Date  . Arterial evalation      no evidence of ICA stenosis  . CARDIAC CATHETERIZATION    . CARDIAC SURGERY    . CORONARY ARTERY BYPASS GRAFT  2006   Post CABG surgery with a LIMA to the LAD, a vein to the diagonal ,,a vein to the the obtuse marginal and vein to the PDA.  [name] CORONARY STENT PLACEMENT  2010  . EAR CYST EXCISION Right 10/05/2018   Procedure: Right Partial pinnectomy;  Surgeon: Izora Gala, MD;  Location: [place];  Service: ENT;  Laterality: Right;  . GRAFT(S) ANGIOGRAM  10/21/2011   Procedure: GRAFT(S) Cyril Loosen;  Surgeon: Leonie Man, MD;  Location[facility] CATH LAB;  Service: Cardiovascular;;  . LEFT HEART CATHETERIZATION WITH CORONARY ANGIOGRAM N/A 10/21/2011   Procedure: LEFT HEART CATHETERIZATION WITH CORONARY ANGIOGRAM;  Surgeon: Leonie Man, MD;  Location: [facility] CATH LAB;  Service: Cardiovascular;  Laterality: N/A;  . LYMPH NODE BIOPSY Right 10/05/2018   Procedure: sentinel NODE BIOPSY with right partial modified neck dissection;  Surgeon: Izora Gala, MD;  Location: [place];  Service: ENT;  Laterality: Right;  . PORTA CATH INSERTION N/A 07/12/2019   Procedure: PORTA CATH INSERTION;  Surgeon: Algernon Huxley, MD;  Location: [place] CV LAB;  Service: Cardiovascular;  Laterality: N/A;  . ROBOT ASSISTED LAPAROSCOPIC NEPHRECTOMY Right 12/13/2019   Procedure: XI ROBOTIC ASSISTED LAPAROSCOPIC NEPHRECTOMY;  Surgeon: Hollice Espy, MD;  Location: [facility] ORS;  Service: Urology;  Laterality: Right;  . SURGERY OF LIP     SKIN CANCER    Social History   Socioeconomic History  .  Marital status: Married    Spouse name: JoAnne Nissen  . Number of children: Not on file  . Years of education: Not on file  . Highest education level: Not on file  Occupational History  . Not on file  Tobacco Use  . Smoking status: Current Every Day Smoker    Packs/day: 2.00    Years: 50.00    Pack years: 100.00    Types: Cigarettes  . Smokeless tobacco: Former Systems developer    Types: [place] date: 01/23/2013  Substance and Sexual Activity  . Alcohol use: No  . Drug use: Not Currently  . Sexual activity: Not Currently  Other Topics Concern  . Not on file  Social History Narrative   Lives with son and grandson   Social Determinants of Health   Financial Resource Strain:   . Difficulty of Paying Living Expenses:   Food Insecurity:   . Worried About Charity fundraiser in the Last Year:   . Arboriculturist in the Last Year:   Transportation Needs:   . Film/video editor (Medical):   [name]  Lack of Transportation (Non-Medical):   Physical Activity:   . Days of Exercise per Week:   . Minutes of Exercise per Session:   Stress:   . Feeling of Stress :   Social Connections:   . Frequency of Communication with Friends and Family:   . Frequency of Social Gatherings with Friends and Family:   . Attends Religious Services:   . Active Member of Clubs or Organizations:   . Attends Archivist Meetings:   [name] Marital Status:   Intimate Partner Violence:   . Fear of Current or Ex-Partner:   . Emotionally Abused:   [name] Physically Abused:   . Sexually Abused:     Family History  Problem Relation Age of Onset  . Hypertension Mother   . Heart attack Mother   . Heart attack Father 6     Current Outpatient Medications:  .  amLODipine (NORVASC) 10 MG tablet, Take 10 mg by mouth every morning., Disp: , Rfl:  .  aspirin EC 81 MG tablet, Take 81 mg by mouth daily., Disp: , Rfl:  .  atorvastatin (LIPITOR) 40 MG tablet, TAKE 1 TABLET (40 MG TOTAL) BY MOUTH AT BEDTIME. [place] (Patient taking differently: Take 40 mg by mouth at bedtime. ), Disp: 90 tablet, Rfl: 0 .  busPIRone (BUSPAR) 7.5 MG tablet, Take 7.5 mg by mouth 2 (two) times daily. , Disp: , Rfl:  .  Fluticasone-Salmeterol (ADVAIR DISKUS) 500-50 MCG/DOSE AEPB, Inhale 1 puff into the lungs 2 (two) times daily., Disp: , Rfl:  .  Fluticasone-Umeclidin-Vilant (TRELEGY ELLIPTA) 100-62.5-25 MCG/INH AEPB, Inhale 1 puff into the lungs daily., Disp: , Rfl:  .  INCRUSE ELLIPTA 62.5 MCG/INH AEPB, Inhale 1 puff into the lungs daily. , Disp: , Rfl: 2 .  Ipratropium-Albuterol (COMBIVENT RESPIMAT) 20-100 MCG/ACT AERS respimat, Inhale 1 puff into the lungs every 6 (six) hours as needed for wheezing or shortness of breath. , Disp: , Rfl:  .  metFORMIN (GLUCOPHAGE) 500 MG tablet, TAKE 1 TABLET (500 MG TOTAL) BY MOUTH 2 (TWO) TIMES DAILY WITH A MEAL. (Patient taking differently: Take 500 mg by mouth daily with breakfast. ), Disp: 180 tablet, Rfl: 0 .  oxyCODONE (OXY IR/ROXICODONE) 5 MG immediate release tablet, Take 1 tablet (5 mg total) by mouth every 6 (six) hours as needed for severe pain., Disp: 120 tablet, Rfl: 0 .  bisoprolol (ZEBETA) 5 MG tablet, Take 0.5 tablets (2.5 mg total) by mouth daily for 7 days, THEN 1 tablet (5 mg total) daily., Disp: 90 tablet, Rfl: 3 .  cyclobenzaprine (FLEXERIL) 5 MG tablet, Take 1 tablet (5 mg total) by mouth 3 (three) times daily as needed for muscle spasms., Disp: 21 tablet, Rfl: 0 .  ezetimibe (ZETIA) 10 MG tablet, TAKE 1 TABLET BY MOUTH EVERY DAY, Disp: 90 tablet, Rfl: 0 .  isosorbide mononitrate (IMDUR) 60 MG 24 hr tablet, TAKE 1 TABLET BY MOUTH EVERY DAY, Disp: 90 tablet, Rfl: 1 .  losartan (COZAAR) 50 MG tablet, Take 1 tablet (50 mg total) by mouth daily., Disp: 30 tablet, Rfl: 11 .  opium-belladonna (B&O SUPPRETTES) 16.2-60 MG suppository, Place 1 suppository rectally every 6 (six) hours as needed for bladder spasms. (Patient not taking: Reported on 02/29/2020), Disp: 4  suppository, Rfl: 0 .  oxybutynin (DITROPAN) 5 MG tablet, Take 1 tablet (5 mg total) by mouth every 8 (eight) hours as needed for bladder spasms. (Patient not taking: Reported on 02/29/2020), Disp: 20 tablet, Rfl: 0 .  oxyCODONE-acetaminophen (PERCOCET/ROXICET) 5-325 MG tablet, Take 1-2 tablets by mouth every 4 (four) hours as needed for moderate pain. (Patient not taking: Reported on 02/29/2020), Disp: 20 tablet, Rfl: 0 .  ranolazine (RANEXA) 1000 MG SR tablet, TAKE 1 TABLET BY MOUTH TWICE A DAY (Patient taking differently: Take 1,000 mg by mouth 2 (two) times daily. ), Disp: 60 tablet, Rfl: 11 No current facility-administered medications for this visit.  Facility-Administered Medications Ordered in Other Visits:  .  heparin lock flush 100 unit/mL, 500 Units, Intravenous, Once, Sindy Guadeloupe, MD .  sodium chloride flush (NS) 0.9 % injection 10 mL, 10 mL, Intravenous, PRN, Sindy Guadeloupe, MD .  sodium chloride flush (NS) 0.9 % injection 10 mL, 10 mL, Intravenous, PRN, Sindy Guadeloupe, MD, 10 mL at 08/31/19 1123  Physical exam:  Vitals:   02/29/20 1356  BP: 110/66  Pulse: 75  Resp: 16  Temp: (!) 97.5 F (36.4 C)  TempSrc: Oral  SpO2: 99%  Weight: 168 lb 1.6 oz (76.2 kg)   Physical Exam Cardiovascular:     Rate and Rhythm: Normal rate and regular rhythm.     Heart sounds: Normal heart sounds.  Pulmonary:     Comments: Breath sounds decreased b/l diffusely. TTP over posterior back around 8th rib Abdominal:     General: Bowel sounds are normal.     Palpations: Abdomen is soft.  Skin:    General: Skin is warm and dry.  Neurological:     Mental Status: He is alert and oriented to person, place, and time.      CMP Latest Ref Rng & Units 02/29/2020  Glucose 70 - 99 mg/dL 106(H)  BUN 8 - 23 mg/dL 18  Creatinine 0.61 - 1.24 mg/dL 1.69(H)  Sodium 135 - 145 mmol/L 136  Potassium 3.5 - 5.1 mmol/L 4.5  Chloride 98 - 111 mmol/L 100  CO2 22 - 32 mmol/L 28  Calcium 8.9 - 10.3 mg/dL 9.2    Total Protein 6.5 - 8.1 g/dL 7.7  Total Bilirubin 0.3 - 1.2 mg/dL 0.8  Alkaline Phos 38 - 126 U/L 111  AST 15 - 41 U/L 15  ALT 0 - 44 U/L 14   CBC Latest Ref Rng & Units 02/29/2020  WBC 4.0 - 10.5 K/uL 12.3(H)  Hemoglobin 13.0 - 17.0 g/dL 11.4(L)  Hematocrit 39.0 - 52.0 % 34.9(L)  Platelets 150 - 400 K/uL 272    Assessment and plan- Patient is a 69 y.o. male with h/o stage 1 lung cancer, Stage III renal cancer and currently undergoing radiation treatment for SCC lip. He is here for left sided chest wall pain  Suspect left chest wall pain and arm pain is musculoskeletal as it is tender to palpation. He is scheduled for routine CT chest abdomen pelvis as a part of his surveillance imaging later this week. I will see if scans show any other etiology  Patient has baseline COPD and chronic exertional SOB which does not appear any worse today. He follows up with pulmonary  I will see him after ct scans are back    Visit Diagnosis 1. Malignant neoplasm of upper lobe of right lung ([place])   2. Dyspnea, unspecified type      Dr. Randa Evens, MD, MPH [facility] at [facility] [phone] 03/02/2020 1:02 PM

## 2020-03-03 ENCOUNTER — Inpatient Hospital Stay: Payer: Medicare Other

## 2020-03-03 ENCOUNTER — Ambulatory Visit
Admission: RE | Admit: 2020-03-03 | Discharge: 2020-03-03 | Disposition: A | Discharge status: Home / Self Care | Payer: Medicare Other | Source: Ambulatory Visit | Attending: Oncology | Admitting: Oncology

## 2020-03-03 ENCOUNTER — Other Ambulatory Visit: Payer: Self-pay

## 2020-03-03 ENCOUNTER — Ambulatory Visit
Admission: RE | Admit: 2020-03-03 | Discharge: 2020-03-03 | Disposition: A | Discharge status: Home / Self Care | Payer: Medicare Other | Source: Ambulatory Visit | Attending: Radiation Oncology | Admitting: Radiation Oncology

## 2020-03-03 DIAGNOSIS — Z923 Personal history of irradiation: Secondary | ICD-10-CM | POA: Diagnosis not present

## 2020-03-03 DIAGNOSIS — R5383 Other fatigue: Secondary | ICD-10-CM | POA: Diagnosis not present

## 2020-03-03 DIAGNOSIS — M79602 Pain in left arm: Secondary | ICD-10-CM | POA: Diagnosis not present

## 2020-03-03 DIAGNOSIS — C7951 Secondary malignant neoplasm of bone: Secondary | ICD-10-CM | POA: Diagnosis not present

## 2020-03-03 DIAGNOSIS — E785 Hyperlipidemia, unspecified: Secondary | ICD-10-CM | POA: Diagnosis not present

## 2020-03-03 DIAGNOSIS — C3411 Malignant neoplasm of upper lobe, right bronchus or lung: Secondary | ICD-10-CM | POA: Diagnosis not present

## 2020-03-03 DIAGNOSIS — Z7951 Long term (current) use of inhaled steroids: Secondary | ICD-10-CM | POA: Diagnosis not present

## 2020-03-03 DIAGNOSIS — I252 Old myocardial infarction: Secondary | ICD-10-CM | POA: Diagnosis not present

## 2020-03-03 DIAGNOSIS — Z7982 Long term (current) use of aspirin: Secondary | ICD-10-CM | POA: Diagnosis not present

## 2020-03-03 DIAGNOSIS — R0789 Other chest pain: Secondary | ICD-10-CM | POA: Diagnosis not present

## 2020-03-03 DIAGNOSIS — M545 Low back pain: Secondary | ICD-10-CM | POA: Diagnosis not present

## 2020-03-03 DIAGNOSIS — I129 Hypertensive chronic kidney disease with stage 1 through stage 4 chronic kidney disease, or unspecified chronic kidney disease: Secondary | ICD-10-CM | POA: Diagnosis not present

## 2020-03-03 DIAGNOSIS — C641 Malignant neoplasm of right kidney, except renal pelvis: Secondary | ICD-10-CM | POA: Diagnosis not present

## 2020-03-03 DIAGNOSIS — Z7984 Long term (current) use of oral hypoglycemic drugs: Secondary | ICD-10-CM | POA: Diagnosis not present

## 2020-03-03 DIAGNOSIS — Z85819 Personal history of malignant neoplasm of unspecified site of lip, oral cavity, and pharynx: Secondary | ICD-10-CM | POA: Diagnosis not present

## 2020-03-03 DIAGNOSIS — Z9221 Personal history of antineoplastic chemotherapy: Secondary | ICD-10-CM | POA: Diagnosis not present

## 2020-03-03 DIAGNOSIS — Z510 Encounter for antineoplastic radiation therapy: Secondary | ICD-10-CM | POA: Diagnosis not present

## 2020-03-03 DIAGNOSIS — Z87891 Personal history of nicotine dependence: Secondary | ICD-10-CM | POA: Diagnosis not present

## 2020-03-03 DIAGNOSIS — E1122 Type 2 diabetes mellitus with diabetic chronic kidney disease: Secondary | ICD-10-CM | POA: Diagnosis not present

## 2020-03-03 DIAGNOSIS — R5381 Other malaise: Secondary | ICD-10-CM | POA: Diagnosis not present

## 2020-03-03 DIAGNOSIS — Z905 Acquired absence of kidney: Secondary | ICD-10-CM | POA: Diagnosis not present

## 2020-03-03 DIAGNOSIS — Z95828 Presence of other vascular implants and grafts: Secondary | ICD-10-CM

## 2020-03-03 DIAGNOSIS — J449 Chronic obstructive pulmonary disease, unspecified: Secondary | ICD-10-CM | POA: Diagnosis not present

## 2020-03-03 DIAGNOSIS — N189 Chronic kidney disease, unspecified: Secondary | ICD-10-CM | POA: Diagnosis not present

## 2020-03-03 DIAGNOSIS — Z79899 Other long term (current) drug therapy: Secondary | ICD-10-CM | POA: Diagnosis not present

## 2020-03-03 DIAGNOSIS — C4400 Unspecified malignant neoplasm of skin of lip: Secondary | ICD-10-CM | POA: Diagnosis not present

## 2020-03-03 DIAGNOSIS — I2510 Atherosclerotic heart disease of native coronary artery without angina pectoris: Secondary | ICD-10-CM | POA: Diagnosis not present

## 2020-03-03 LAB — COMPREHENSIVE METABOLIC PANEL
ALT: 13 U/L (ref 0–44)
AST: 16 U/L (ref 15–41)
Albumin: 3.9 g/dL (ref 3.5–5.0)
Alkaline Phosphatase: 108 U/L (ref 38–126)
Anion gap: 11 (ref 5–15)
BUN: 22 mg/dL (ref 8–23)
CO2: 26 mmol/L (ref 22–32)
Calcium: 9.4 mg/dL (ref 8.9–10.3)
Chloride: 100 mmol/L (ref 98–111)
Creatinine, Ser: 1.82 mg/dL — ABNORMAL HIGH (ref 0.61–1.24)
GFR calc Af Amer: 42 mL/min — ABNORMAL LOW (ref >60)
GFR calc non Af Amer: 37 mL/min — ABNORMAL LOW (ref >60)
Glucose, Bld: 154 mg/dL — ABNORMAL HIGH (ref 70–99)
Potassium: 3.9 mmol/L (ref 3.5–5.1)
Sodium: 137 mmol/L (ref 135–145)
Total Bilirubin: 0.7 mg/dL (ref 0.3–1.2)
Total Protein: 7.7 g/dL (ref 6.5–8.1)

## 2020-03-03 LAB — CBC WITH DIFFERENTIAL/PLATELET
Abs Immature Granulocytes: 0.14 10*3/uL — ABNORMAL HIGH (ref 0.00–0.07)
Basophils Absolute: 0 10*3/uL (ref 0.0–0.1)
Basophils Relative: 0 %
Eosinophils Absolute: 0 10*3/uL (ref 0.0–0.5)
Eosinophils Relative: 0 %
HCT: 35.8 % — ABNORMAL LOW (ref 39.0–52.0)
Hemoglobin: 11.8 g/dL — ABNORMAL LOW (ref 13.0–17.0)
Immature Granulocytes: 1 %
Lymphocytes Relative: 5 %
Lymphs Abs: 1.1 10*3/uL (ref 0.7–4.0)
MCH: 29.3 pg (ref 26.0–34.0)
MCHC: 33 g/dL (ref 30.0–36.0)
MCV: 88.8 fL (ref 80.0–100.0)
Monocytes Absolute: 1.4 10*3/uL — ABNORMAL HIGH (ref 0.1–1.0)
Monocytes Relative: 6 %
Neutro Abs: 20.6 10*3/uL — ABNORMAL HIGH (ref 1.7–7.7)
Neutrophils Relative %: 88 %
Platelets: 297 10*3/uL (ref 150–400)
RBC: 4.03 MIL/uL — ABNORMAL LOW (ref 4.22–5.81)
RDW: 17.1 % — ABNORMAL HIGH (ref 11.5–15.5)
WBC: 23.3 10*3/uL — ABNORMAL HIGH (ref 4.0–10.5)
nRBC: 0 % (ref 0.0–0.2)

## 2020-03-03 MED ORDER — HEPARIN SOD (PORK) LOCK FLUSH 100 UNIT/ML IV SOLN
500.0000 [IU] | Freq: Once | INTRAVENOUS | Status: AC
  Administered 2020-03-03: 500 [IU] via INTRAVENOUS
  Filled 2020-03-03: qty 5

## 2020-03-03 MED ORDER — IOHEXOL 300 MG/ML  SOLN
75.0000 mL | Freq: Once | INTRAMUSCULAR | Status: AC | PRN
  Administered 2020-03-03: 75 mL via INTRAVENOUS

## 2020-03-03 MED ORDER — SODIUM CHLORIDE 0.9% FLUSH
10.0000 mL | Freq: Once | INTRAVENOUS | Status: AC
  Administered 2020-03-03: 10 mL via INTRAVENOUS
  Filled 2020-03-03: qty 10

## 2020-03-06 ENCOUNTER — Inpatient Hospital Stay (HOSPITAL_BASED_OUTPATIENT_CLINIC_OR_DEPARTMENT_OTHER): Payer: Medicare Other | Admitting: Oncology

## 2020-03-06 ENCOUNTER — Encounter: Payer: Self-pay | Admitting: Oncology

## 2020-03-06 ENCOUNTER — Ambulatory Visit
Admission: RE | Admit: 2020-03-06 | Discharge: 2020-03-06 | Disposition: A | Discharge status: Home / Self Care | Payer: Medicare Other | Source: Ambulatory Visit | Attending: Radiation Oncology | Admitting: Radiation Oncology

## 2020-03-06 ENCOUNTER — Other Ambulatory Visit: Payer: Self-pay

## 2020-03-06 VITALS — BP 104/63 | HR 69 | Temp 97.0°F | Resp 16 | Wt 166.9 lb

## 2020-03-06 DIAGNOSIS — C641 Malignant neoplasm of right kidney, except renal pelvis: Secondary | ICD-10-CM

## 2020-03-06 DIAGNOSIS — Z7984 Long term (current) use of oral hypoglycemic drugs: Secondary | ICD-10-CM | POA: Diagnosis not present

## 2020-03-06 DIAGNOSIS — E1122 Type 2 diabetes mellitus with diabetic chronic kidney disease: Secondary | ICD-10-CM | POA: Diagnosis not present

## 2020-03-06 DIAGNOSIS — C3411 Malignant neoplasm of upper lobe, right bronchus or lung: Secondary | ICD-10-CM | POA: Diagnosis not present

## 2020-03-06 DIAGNOSIS — Z905 Acquired absence of kidney: Secondary | ICD-10-CM | POA: Diagnosis not present

## 2020-03-06 DIAGNOSIS — N189 Chronic kidney disease, unspecified: Secondary | ICD-10-CM | POA: Diagnosis not present

## 2020-03-06 DIAGNOSIS — M79602 Pain in left arm: Secondary | ICD-10-CM | POA: Diagnosis not present

## 2020-03-06 DIAGNOSIS — I129 Hypertensive chronic kidney disease with stage 1 through stage 4 chronic kidney disease, or unspecified chronic kidney disease: Secondary | ICD-10-CM | POA: Diagnosis not present

## 2020-03-06 DIAGNOSIS — Z923 Personal history of irradiation: Secondary | ICD-10-CM | POA: Diagnosis not present

## 2020-03-06 DIAGNOSIS — Z7951 Long term (current) use of inhaled steroids: Secondary | ICD-10-CM | POA: Diagnosis not present

## 2020-03-06 DIAGNOSIS — R0789 Other chest pain: Secondary | ICD-10-CM | POA: Diagnosis not present

## 2020-03-06 DIAGNOSIS — Z7982 Long term (current) use of aspirin: Secondary | ICD-10-CM | POA: Diagnosis not present

## 2020-03-06 DIAGNOSIS — Z85819 Personal history of malignant neoplasm of unspecified site of lip, oral cavity, and pharynx: Secondary | ICD-10-CM | POA: Diagnosis not present

## 2020-03-06 DIAGNOSIS — Z9221 Personal history of antineoplastic chemotherapy: Secondary | ICD-10-CM | POA: Diagnosis not present

## 2020-03-06 DIAGNOSIS — R5383 Other fatigue: Secondary | ICD-10-CM | POA: Diagnosis not present

## 2020-03-06 DIAGNOSIS — Z79899 Other long term (current) drug therapy: Secondary | ICD-10-CM | POA: Diagnosis not present

## 2020-03-06 DIAGNOSIS — R5381 Other malaise: Secondary | ICD-10-CM | POA: Diagnosis not present

## 2020-03-06 DIAGNOSIS — I252 Old myocardial infarction: Secondary | ICD-10-CM | POA: Diagnosis not present

## 2020-03-06 DIAGNOSIS — M545 Low back pain: Secondary | ICD-10-CM | POA: Diagnosis not present

## 2020-03-06 DIAGNOSIS — I2510 Atherosclerotic heart disease of native coronary artery without angina pectoris: Secondary | ICD-10-CM | POA: Diagnosis not present

## 2020-03-06 DIAGNOSIS — C4400 Unspecified malignant neoplasm of skin of lip: Secondary | ICD-10-CM | POA: Diagnosis not present

## 2020-03-06 DIAGNOSIS — E785 Hyperlipidemia, unspecified: Secondary | ICD-10-CM | POA: Diagnosis not present

## 2020-03-06 DIAGNOSIS — J449 Chronic obstructive pulmonary disease, unspecified: Secondary | ICD-10-CM | POA: Diagnosis not present

## 2020-03-06 DIAGNOSIS — Z87891 Personal history of nicotine dependence: Secondary | ICD-10-CM | POA: Diagnosis not present

## 2020-03-06 DIAGNOSIS — Z510 Encounter for antineoplastic radiation therapy: Secondary | ICD-10-CM | POA: Diagnosis not present

## 2020-03-06 NOTE — Progress Notes (Signed)
Pt lip is hurting really bad and his eating is not as good as it has been. Still urinating and having a BM. Today lip hurting and then he said left arm hurting

## 2020-03-07 ENCOUNTER — Ambulatory Visit
Admission: RE | Admit: 2020-03-07 | Discharge: 2020-03-07 | Disposition: A | Discharge status: Home / Self Care | Payer: Medicare Other | Source: Ambulatory Visit | Attending: Radiation Oncology | Admitting: Radiation Oncology

## 2020-03-07 DIAGNOSIS — C4400 Unspecified malignant neoplasm of skin of lip: Secondary | ICD-10-CM | POA: Diagnosis not present

## 2020-03-07 DIAGNOSIS — Z510 Encounter for antineoplastic radiation therapy: Secondary | ICD-10-CM | POA: Diagnosis not present

## 2020-03-08 ENCOUNTER — Ambulatory Visit: Payer: Medicare Other

## 2020-03-09 ENCOUNTER — Ambulatory Visit: Payer: Medicare Other

## 2020-03-09 NOTE — Progress Notes (Signed)
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 As a part of his staging work-up he underwent CT chest as well as CT neck. 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 CT neck was unremarkable however CT chest showed a right upper lobe nodule 2.1 x 1.7 cm in with minimal spiculation as well as a 0.5 cm right lower lobe nodule. No mediastinal or hilar adenopathy was noted. Patient was referred to [facility] for further work-up given his preference. He underwent a PET CT scan on 06/10/2019 which showed a hypermetabolic right upper lobe lung nodule measuring 2 cm. Equivocal right infrahilar hypermetabolism with an SUV of 3.1. 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 Large right kidney mass measuring 9.7 x 9 cm. Patient underwent CT-guided lung biopsy which was consistent with large cell neuroendocrine carcinoma. Patient completedconcurrentchemoradiation with carboplatin and etoposide on 09/28/2019.  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 Tumor necrosis present tumor extension  into renal sinus. Margins negative. Lymphovascular invasion positive. Sarcomatoid features not identified. [id number]   Interval history-patient currently reports pain due to his ongoing lip radiation treatment.  It becomes difficult for him to open his mouth and he has not been able to wear his dentures.  He is still active and is able to perform his ADLs and IADLs.  Reports ongoing fatigue.  Left chest wall pain has improved.  ECOG PS- 2 Pain scale- 3   Review of systems- Review of Systems  Constitutional: Positive for malaise/fatigue. Negative for chills, fever and weight loss.  HENT: Negative for congestion, ear discharge and nosebleeds.        Lip pain  Eyes: Negative for blurred vision.  Respiratory: Negative for cough, hemoptysis, sputum production, shortness of breath and wheezing.   Cardiovascular: Negative for chest pain, palpitations, orthopnea and claudication.  Gastrointestinal: Negative for abdominal pain, blood in stool, constipation, diarrhea, heartburn, melena, nausea and vomiting.  Genitourinary: Negative for dysuria, flank pain, frequency, hematuria and urgency.  Musculoskeletal: Negative for back pain, joint pain and myalgias.  Skin: Negative for rash.  Neurological: Negative for dizziness, tingling, focal weakness, seizures, weakness and headaches.  Endo/Heme/Allergies: Does not bruise/bleed easily.  Psychiatric/Behavioral: Negative for depression and suicidal ideas. The patient does not have insomnia.       Allergies  Allergen Reactions  . Codeine Nausea And Vomiting and Other (See Comments)    Pt states when he takes codeine, he throws up blood  . Niacin And Related Other (See Comments)    "made his body feel hot & he had a hard time breathing"     Past Medical History:  Diagnosis Date  . Anxiety   . COPD (chronic obstructive pulmonary disease) ([place])   . Coronary artery disease 2006   Acute coronary syndrome in March 2006.   . Diabetes mellitus  without complication ([place])   . Dyslipidemia   . Hypertension 10/09/2010   EF 40-45%  . Malignant neoplasm of upper lobe of right lung (HCC)    Chemo + rad tx's.   . Melanoma in situ of ear, right ([place]) 09/2018  . Myocardial infarction ([place])   . Renal cancer, right ([place]) 11/2019   Nephrectomy.  . Sleep apnea    uses cpap machine     Past Surgical History:  Procedure Laterality Date  . Arterial evalation      no evidence of ICA stenosis  . CARDIAC CATHETERIZATION    . CARDIAC SURGERY    . CORONARY ARTERY BYPASS GRAFT  2006   Post CABG surgery with a LIMA to the LAD, a vein to the diagonal ,,a vein to the the obtuse marginal and vein to the PDA.  [name] CORONARY STENT PLACEMENT  2010  . EAR CYST EXCISION Right 10/05/2018   Procedure: Right Partial pinnectomy;  Surgeon: Izora Gala, MD;  Location: [place];  Service: ENT;  Laterality: Right;  . GRAFT(S) ANGIOGRAM  10/21/2011   Procedure: GRAFT(S) Cyril Loosen;  Surgeon: Leonie Man, MD;  Location: [facility] CATH LAB;  Service: Cardiovascular;;  . LEFT HEART CATHETERIZATION WITH CORONARY ANGIOGRAM N/A 10/21/2011   Procedure: LEFT HEART CATHETERIZATION WITH CORONARY ANGIOGRAM;  Surgeon: Leonie Man, MD;  Location: [facility] CATH LAB;  Service: Cardiovascular;  Laterality: N/A;  . LYMPH NODE BIOPSY Right 10/05/2018   Procedure: sentinel NODE BIOPSY with right partial modified neck dissection;  Surgeon: Izora Gala, MD;  Location: [place];  Service: ENT;  Laterality: Right;  . PORTA CATH INSERTION N/A 07/12/2019   Procedure: PORTA CATH INSERTION;  Surgeon: Algernon Huxley, MD;  Location: [place] CV LAB;  Service: Cardiovascular;  Laterality: N/A;  . ROBOT ASSISTED LAPAROSCOPIC NEPHRECTOMY Right 12/13/2019   Procedure: XI ROBOTIC ASSISTED LAPAROSCOPIC NEPHRECTOMY;  Surgeon: Hollice Espy, MD;  Location: [facility] ORS;  Service: Urology;  Laterality: Right;  . SURGERY OF LIP     SKIN CANCER    Social History   Socioeconomic History  . Marital status:  Married    Spouse name: JoAnne Strnad  . Number of children: Not on file  . Years of education: Not on file  . Highest education level: Not on file  Occupational History  . Not on file  Tobacco Use  . Smoking status: Current Every Day Smoker    Packs/day: 2.00    Years: 50.00    Pack years: 100.00    Types: Cigarettes  . Smokeless tobacco: Former Systems developer    Types: [place] date: 01/23/2013  Substance and Sexual Activity  . Alcohol use: No  . Drug use: Not Currently  . Sexual activity: Not Currently  Other Topics Concern  . Not on file  Social History Narrative   Lives with son and grandson   Social Determinants of Health   Financial Resource Strain:   . Difficulty of Paying Living Expenses:   Food Insecurity:   . Worried About Charity fundraiser in the Last Year:   . Arboriculturist in the Last Year:   Transportation Needs:   . Film/video editor (Medical):   [name] Lack of Transportation (Non-Medical):   Physical Activity:   . Days  of Exercise per Week:   . Minutes of Exercise per Session:   Stress:   . Feeling of Stress :   Social Connections:   . Frequency of Communication with Friends and Family:   . Frequency of Social Gatherings with Friends and Family:   . Attends Religious Services:   . Active Member of Clubs or Organizations:   . Attends Archivist Meetings:   [name] Marital Status:   Intimate Partner Violence:   . Fear of Current or Ex-Partner:   . Emotionally Abused:   [name] Physically Abused:   . Sexually Abused:     Family History  Problem Relation Age of Onset  . Hypertension Mother   . Heart attack Mother   . Heart attack Father 14     Current Outpatient Medications:  .  amLODipine (NORVASC) 10 MG tablet, Take 10 mg by mouth every morning., Disp: , Rfl:  .  aspirin EC 81 MG tablet, Take 81 mg by mouth daily., Disp: , Rfl:  .  atorvastatin (LIPITOR) 40 MG tablet, TAKE 1 TABLET (40 MG TOTAL) BY MOUTH AT BEDTIME. [place]  (Patient taking differently: Take 40 mg by mouth at bedtime. ), Disp: 90 tablet, Rfl: 0 .  bisoprolol (ZEBETA) 5 MG tablet, Take 0.5 tablets (2.5 mg total) by mouth daily for 7 days, THEN 1 tablet (5 mg total) daily., Disp: 90 tablet, Rfl: 3 .  busPIRone (BUSPAR) 7.5 MG tablet, Take 7.5 mg by mouth 2 (two) times daily. , Disp: , Rfl:  .  cyclobenzaprine (FLEXERIL) 5 MG tablet, Take 1 tablet (5 mg total) by mouth 3 (three) times daily as needed for muscle spasms., Disp: 21 tablet, Rfl: 0 .  ezetimibe (ZETIA) 10 MG tablet, TAKE 1 TABLET BY MOUTH EVERY DAY, Disp: 90 tablet, Rfl: 0 .  Fluticasone-Salmeterol (ADVAIR DISKUS) 500-50 MCG/DOSE AEPB, Inhale 1 puff into the lungs 2 (two) times daily., Disp: , Rfl:  .  Fluticasone-Umeclidin-Vilant (TRELEGY ELLIPTA) 100-62.5-25 MCG/INH AEPB, Inhale 1 puff into the lungs daily., Disp: , Rfl:  .  INCRUSE ELLIPTA 62.5 MCG/INH AEPB, Inhale 1 puff into the lungs daily. , Disp: , Rfl: 2 .  Ipratropium-Albuterol (COMBIVENT RESPIMAT) 20-100 MCG/ACT AERS respimat, Inhale 1 puff into the lungs every 6 (six) hours as needed for wheezing or shortness of breath. , Disp: , Rfl:  .  isosorbide mononitrate (IMDUR) 60 MG 24 hr tablet, TAKE 1 TABLET BY MOUTH EVERY DAY, Disp: 90 tablet, Rfl: 1 .  losartan (COZAAR) 50 MG tablet, Take 1 tablet (50 mg total) by mouth daily., Disp: 30 tablet, Rfl: 11 .  metFORMIN (GLUCOPHAGE) 500 MG tablet, TAKE 1 TABLET (500 MG TOTAL) BY MOUTH 2 (TWO) TIMES DAILY WITH A MEAL. (Patient taking differently: Take 500 mg by mouth daily with breakfast. ), Disp: 180 tablet, Rfl: 0 .  oxyCODONE (OXY IR/ROXICODONE) 5 MG immediate release tablet, Take 1 tablet (5 mg total) by mouth every 6 (six) hours as needed for severe pain., Disp: 120 tablet, Rfl: 0 .  ranolazine (RANEXA) 1000 MG SR tablet, TAKE 1 TABLET BY MOUTH TWICE A DAY (Patient taking differently: Take 1,000 mg by mouth 2 (two) times daily. ), Disp: 60 tablet, Rfl: 11 .  opium-belladonna (B&O  SUPPRETTES) 16.2-60 MG suppository, Place 1 suppository rectally every 6 (six) hours as needed for bladder spasms. (Patient not taking: Reported on 02/29/2020), Disp: 4 suppository, Rfl: 0 .  oxybutynin (DITROPAN) 5 MG tablet, Take 1 tablet (5 mg total) by mouth  every 8 (eight) hours as needed for bladder spasms. (Patient not taking: Reported on 02/29/2020), Disp: 20 tablet, Rfl: 0 .  oxyCODONE-acetaminophen (PERCOCET/ROXICET) 5-325 MG tablet, Take 1-2 tablets by mouth every 4 (four) hours as needed for moderate pain. (Patient not taking: Reported on 02/29/2020), Disp: 20 tablet, Rfl: 0 No current facility-administered medications for this visit.  Facility-Administered Medications Ordered in Other Visits:  .  heparin lock flush 100 unit/mL, 500 Units, Intravenous, Once, Sindy Guadeloupe, MD .  sodium chloride flush (NS) 0.9 % injection 10 mL, 10 mL, Intravenous, PRN, Sindy Guadeloupe, MD .  sodium chloride flush (NS) 0.9 % injection 10 mL, 10 mL, Intravenous, PRN, Sindy Guadeloupe, MD, 10 mL at 08/31/19 1123  Physical exam:  Vitals:   03/06/20 1207  BP: 104/63  Pulse: 69  Resp: 16  Temp: (!) 97 F (36.1 C)  TempSrc: Tympanic  Weight: 166 lb 14.4 oz (75.7 kg)   Physical Exam HENT:     Mouth/Throat:     Comments: There is excoriation and superficial ulceration noted throughout the lower lip secondary to ongoing radiation Cardiovascular:     Rate and Rhythm: Normal rate and regular rhythm.     Heart sounds: Normal heart sounds.  Pulmonary:     Effort: Pulmonary effort is normal.     Comments: Breath sounds decreased bilaterally diffusely Abdominal:     General: Bowel sounds are normal.     Palpations: Abdomen is soft.  Skin:    General: Skin is warm and dry.  Neurological:     Mental Status: He is alert and oriented to person, place, and time.      CMP Latest Ref Rng & Units 03/03/2020  Glucose 70 - 99 mg/dL 154(H)  BUN 8 - 23 mg/dL 22  Creatinine 0.61 - 1.24 mg/dL 1.82(H)  Sodium 135  - 145 mmol/L 137  Potassium 3.5 - 5.1 mmol/L 3.9  Chloride 98 - 111 mmol/L 100  CO2 22 - 32 mmol/L 26  Calcium 8.9 - 10.3 mg/dL 9.4  Total Protein 6.5 - 8.1 g/dL 7.7  Total Bilirubin 0.3 - 1.2 mg/dL 0.7  Alkaline Phos 38 - 126 U/L 108  AST 15 - 41 U/L 16  ALT 0 - 44 U/L 13   CBC Latest Ref Rng & Units 03/03/2020  WBC 4.0 - 10.5 K/uL 23.3(H)  Hemoglobin 13.0 - 17.0 g/dL 11.8(L)  Hematocrit 39.0 - 52.0 % 35.8(L)  Platelets 150 - 400 K/uL 297    No images are attached to the encounter.  CT Chest W Contrast  Result Date: 03/03/2020 CLINICAL DATA:  Right upper lobe lung cancer (large-cell neuroendocrine carcinoma stage I), diagnosed 2020, status post chemotherapy and radiation. RCC, status post right nephrectomy in February 2021. 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 Intermittent left chest discomfort. EXAM: CT CHEST, ABDOMEN, AND PELVIS WITH CONTRAST TECHNIQUE: Multidetector CT imaging of the chest, abdomen and pelvis was performed following the standard protocol during bolus administration of intravenous contrast. CONTRAST:  59mL OMNIPAQUE IOHEXOL 300 MG/ML  SOLN COMPARISON:  01/12/2020 FINDINGS: CT CHEST FINDINGS Cardiovascular: The heart is normal in size. No pericardial effusion. No evidence of thoracic aortic aneurysm. Atherosclerotic calcifications of the aortic arch. Three vessel coronary atherosclerosis. Postsurgical changes related to prior CABG. Left chest port terminates in the distal left brachiocephalic vein, unchanged. Mediastinum/Nodes: No suspicious mediastinal, hilar, or axillary lymphadenopathy. Visualized thyroid is unremarkable. Lungs/Pleura: Moderate centrilobular emphysematous changes, upper lung predominant. Masslike fibrosis in the lateral right  upper lobe (series 3/image 66), reflecting radiation changes, improved (currently measuring approximately 4.2 x 1.6 cm, previously 4.8 x 2.0 cm). No focal consolidation. Scattered bilateral pulmonary nodules in both  lobes, right lower lobe predominant, mildly progressive and considered suspicious for metastatic disease. Index lesions include: --9 mm medial left lower lobe nodule (series 3/image 87), previously 8 mm --7 mm medial right middle lobe nodule (series 3/image 94), unchanged --10 mm lateral right lower lobe nodule (series 3/image 94), previously 9 mm when measured in a similar fashion --8 mm posterior right lower lobe nodule (series 3/image 108), previously 7 mm No pleural effusion or pneumothorax. Musculoskeletal: No focal osseous lesions.  Median sternotomy. CT ABDOMEN PELVIS FINDINGS Hepatobiliary: Liver is within normal limits. Gallbladder is unremarkable. No intrahepatic or extrahepatic ductal dilatation. Pancreas: Within normal limits. Spleen: Within normal limits. Adrenals/Urinary Tract: 2.8 cm left adrenal mass, unchanged, previously characterized as a benign adrenal adenoma. Stable mild thickening of the medial right adrenal gland (series 2/image 53). Status post right nephrectomy. No abnormal soft tissue in the surgical bed. 2 mm nonobstructing left upper pole renal calculus (series 2/image 70). 4.0 cm posterior left lower pole renal cyst (series 2/image 36), benign (Bosniak I). No hydronephrosis. Bladder is mildly thick-walled although underdistended. Stomach/Bowel: Stomach is within normal limits. No evidence of bowel obstruction. Normal appendix (series 2/image 36). Moderate right colonic stool burden, raising the possibility of mild constipation. Vascular/Lymphatic: No evidence of abdominal aortic aneurysm. Atherosclerotic calcifications of the abdominal aorta and branch vessels. No suspicious abdominopelvic lymphadenopathy. Reproductive: Prostate is grossly unremarkable. Other: No abdominopelvic ascites. Musculoskeletal: 2.0 cm sclerotic lesion involving the L3 vertebral body (series 2/image 75), progressive. IMPRESSION: Radiation changes in the right upper lobe, mildly improved. Bilateral pulmonary  metastases, mildly progressive, with index lesions as above. Osseous metastasis at L3, mildly progressive. Status post right nephrectomy. Left chest port terminates in the distal left brachiocephalic vein, unchanged. Additional stable ancillary findings as above. Electronically Signed   By: Julian Hy M.D.   On: 03/03/2020 12:44   CT ABDOMEN PELVIS W CONTRAST  Result Date: 03/03/2020 CLINICAL DATA:  Right upper lobe lung cancer (large-cell neuroendocrine carcinoma stage I), diagnosed 2020, status post chemotherapy and radiation. RCC, status post right nephrectomy in February 2021. 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 Intermittent left chest discomfort. EXAM: CT CHEST, ABDOMEN, AND PELVIS WITH CONTRAST TECHNIQUE: Multidetector CT imaging of the chest, abdomen and pelvis was performed following the standard protocol during bolus administration of intravenous contrast. CONTRAST:  21mL OMNIPAQUE IOHEXOL 300 MG/ML  SOLN COMPARISON:  01/12/2020 FINDINGS: CT CHEST FINDINGS Cardiovascular: The heart is normal in size. No pericardial effusion. No evidence of thoracic aortic aneurysm. Atherosclerotic calcifications of the aortic arch. Three vessel coronary atherosclerosis. Postsurgical changes related to prior CABG. Left chest port terminates in the distal left brachiocephalic vein, unchanged. Mediastinum/Nodes: No suspicious mediastinal, hilar, or axillary lymphadenopathy. Visualized thyroid is unremarkable. Lungs/Pleura: Moderate centrilobular emphysematous changes, upper lung predominant. Masslike fibrosis in the lateral right upper lobe (series 3/image 66), reflecting radiation changes, improved (currently measuring approximately 4.2 x 1.6 cm, previously 4.8 x 2.0 cm). No focal consolidation. Scattered bilateral pulmonary nodules in both lobes, right lower lobe predominant, mildly progressive and considered suspicious for metastatic disease. Index lesions include: --9 mm medial left lower lobe  nodule (series 3/image 87), previously 8 mm --7 mm medial right middle lobe nodule (series 3/image 94), unchanged --10 mm lateral right lower lobe nodule (series 3/image 94), previously 9 mm when measured  in a similar fashion --8 mm posterior right lower lobe nodule (series 3/image 108), previously 7 mm No pleural effusion or pneumothorax. Musculoskeletal: No focal osseous lesions.  Median sternotomy. CT ABDOMEN PELVIS FINDINGS Hepatobiliary: Liver is within normal limits. Gallbladder is unremarkable. No intrahepatic or extrahepatic ductal dilatation. Pancreas: Within normal limits. Spleen: Within normal limits. Adrenals/Urinary Tract: 2.8 cm left adrenal mass, unchanged, previously characterized as a benign adrenal adenoma. Stable mild thickening of the medial right adrenal gland (series 2/image 53). Status post right nephrectomy. No abnormal soft tissue in the surgical bed. 2 mm nonobstructing left upper pole renal calculus (series 2/image 70). 4.0 cm posterior left lower pole renal cyst (series 2/image 36), benign (Bosniak I). No hydronephrosis. Bladder is mildly thick-walled although underdistended. Stomach/Bowel: Stomach is within normal limits. No evidence of bowel obstruction. Normal appendix (series 2/image 36). Moderate right colonic stool burden, raising the possibility of mild constipation. Vascular/Lymphatic: No evidence of abdominal aortic aneurysm. Atherosclerotic calcifications of the abdominal aorta and branch vessels. No suspicious abdominopelvic lymphadenopathy. Reproductive: Prostate is grossly unremarkable. Other: No abdominopelvic ascites. Musculoskeletal: 2.0 cm sclerotic lesion involving the L3 vertebral body (series 2/image 75), progressive. IMPRESSION: Radiation changes in the right upper lobe, mildly improved. Bilateral pulmonary metastases, mildly progressive, with index lesions as above. Osseous metastasis at L3, mildly progressive. Status post right nephrectomy. Left chest port  terminates in the distal left brachiocephalic vein, unchanged. Additional stable ancillary findings as above. Electronically Signed   By: Julian Hy M.D.   On: 03/03/2020 12:44     Assessment and plan- Patient is a 69 y.o. male with h/o stage 1 lung cancer, Stage III renal cancer and currently undergoing radiation treatment for SCC lip.  He is here to discuss CT scan results and further management  1.  CT scan shows bilateral subcentimeter pulmonary nodules which appear mildly progressive as compared to his prior scan 2 months ago.  However these Nodules remain subcentimeter and a minimally increased for example lung nodule which was previously 8 mm is now 9 mm.  The 1 which was 9 mm is now 10 mm.  Although these nodules are suspicious for metastatic disease they are too small to biopsy at this time.  Patient has a history of large cell neuro endocrine carcinoma of the lung as well as renal cell carcinoma s/p resection.  He also has ongoing squamous cell carcinoma of the lip.  It would therefore be important to biopsy to decide what type of cancer we are dealing with  However patient has multiple malignancies and has ongoing fatigue as well as difficulty opening his mouth from his ongoing lip cancer.  Even if this is biopsy-proven malignancy he is not an immediate candidate for any treatment.  I would like him to recover from his lip radiation first.  I will plan to get a repeat CT scan in 3 months time.  If these nodules are further enlarged I will plan for biopsy and further management.  He may be a candidate for immunotherapy at that time  2.  ONG:EXBMWU numbers have been going up ever since his right radical nephrectomy.  His serum creatinine has been around 1.7-1.8 since March 2021.  I will refer him to nephrology  3.  Suspect left posterior chest wall pain likely musculoskeletal.  No obvious etiology noted on CT scan.  3.  Patient noted to have possible metastases at the L3.  He does not  have any significant pain in that area.  I will touch  base with Dr. Donella Stade to see if he would consider prophylactic radiation to that area. Will hold off on bisphosphonates at this time but consider based on ct findings in 3 months   Visit Diagnosis 1. Malignant neoplasm of upper lobe of right lung ([place])   2. Renal cell carcinoma of right kidney ([place])   3. H/O malignant neoplasm of lip      Dr. Randa Evens, MD, MPH [facility] at [facility] [phone] 03/09/2020 10:04 AM

## 2020-03-10 ENCOUNTER — Ambulatory Visit: Payer: Medicare Other

## 2020-03-14 ENCOUNTER — Ambulatory Visit
Admission: RE | Admit: 2020-03-14 | Discharge: 2020-03-14 | Disposition: A | Discharge status: Home / Self Care | Payer: Medicare Other | Source: Ambulatory Visit | Attending: Radiation Oncology | Admitting: Radiation Oncology

## 2020-03-14 DIAGNOSIS — Z510 Encounter for antineoplastic radiation therapy: Secondary | ICD-10-CM | POA: Insufficient documentation

## 2020-03-14 DIAGNOSIS — C001 Malignant neoplasm of external lower lip: Secondary | ICD-10-CM | POA: Diagnosis not present

## 2020-03-14 DIAGNOSIS — C4400 Unspecified malignant neoplasm of skin of lip: Secondary | ICD-10-CM | POA: Insufficient documentation

## 2020-03-15 ENCOUNTER — Ambulatory Visit
Admission: RE | Admit: 2020-03-15 | Discharge: 2020-03-15 | Disposition: A | Discharge status: Home / Self Care | Payer: Medicare Other | Source: Ambulatory Visit | Attending: Radiation Oncology | Admitting: Radiation Oncology

## 2020-03-15 DIAGNOSIS — C4400 Unspecified malignant neoplasm of skin of lip: Secondary | ICD-10-CM | POA: Diagnosis not present

## 2020-03-15 DIAGNOSIS — Z510 Encounter for antineoplastic radiation therapy: Secondary | ICD-10-CM | POA: Diagnosis not present

## 2020-03-16 ENCOUNTER — Ambulatory Visit
Admission: RE | Admit: 2020-03-16 | Discharge: 2020-03-16 | Disposition: A | Discharge status: Home / Self Care | Payer: Medicare Other | Source: Ambulatory Visit | Attending: Radiation Oncology | Admitting: Radiation Oncology

## 2020-03-16 ENCOUNTER — Ambulatory Visit: Payer: Medicare Other

## 2020-03-16 DIAGNOSIS — J449 Chronic obstructive pulmonary disease, unspecified: Secondary | ICD-10-CM | POA: Diagnosis not present

## 2020-03-16 DIAGNOSIS — Z510 Encounter for antineoplastic radiation therapy: Secondary | ICD-10-CM | POA: Diagnosis not present

## 2020-03-16 DIAGNOSIS — C4400 Unspecified malignant neoplasm of skin of lip: Secondary | ICD-10-CM | POA: Diagnosis not present

## 2020-03-17 ENCOUNTER — Ambulatory Visit
Admission: RE | Admit: 2020-03-17 | Discharge: 2020-03-17 | Disposition: A | Discharge status: Home / Self Care | Payer: Medicare Other | Source: Ambulatory Visit | Attending: Radiation Oncology | Admitting: Radiation Oncology

## 2020-03-17 DIAGNOSIS — C4400 Unspecified malignant neoplasm of skin of lip: Secondary | ICD-10-CM | POA: Diagnosis not present

## 2020-03-17 DIAGNOSIS — Z510 Encounter for antineoplastic radiation therapy: Secondary | ICD-10-CM | POA: Diagnosis not present

## 2020-03-20 ENCOUNTER — Other Ambulatory Visit: Payer: Self-pay | Admitting: Cardiovascular Disease

## 2020-03-20 ENCOUNTER — Ambulatory Visit
Admission: RE | Admit: 2020-03-20 | Discharge: 2020-03-20 | Disposition: A | Discharge status: Home / Self Care | Payer: Medicare Other | Source: Ambulatory Visit | Attending: Radiation Oncology | Admitting: Radiation Oncology

## 2020-03-20 DIAGNOSIS — Z510 Encounter for antineoplastic radiation therapy: Secondary | ICD-10-CM | POA: Diagnosis not present

## 2020-03-20 DIAGNOSIS — C4400 Unspecified malignant neoplasm of skin of lip: Secondary | ICD-10-CM | POA: Diagnosis not present

## 2020-03-21 ENCOUNTER — Ambulatory Visit
Admission: RE | Admit: 2020-03-21 | Discharge: 2020-03-21 | Disposition: A | Discharge status: Home / Self Care | Payer: Medicare Other | Source: Ambulatory Visit | Attending: Radiation Oncology | Admitting: Radiation Oncology

## 2020-03-21 DIAGNOSIS — C7A1 Malignant poorly differentiated neuroendocrine tumors: Secondary | ICD-10-CM | POA: Diagnosis not present

## 2020-03-21 DIAGNOSIS — Z510 Encounter for antineoplastic radiation therapy: Secondary | ICD-10-CM | POA: Diagnosis not present

## 2020-03-21 DIAGNOSIS — C4400 Unspecified malignant neoplasm of skin of lip: Secondary | ICD-10-CM | POA: Diagnosis not present

## 2020-03-22 ENCOUNTER — Ambulatory Visit: Payer: Medicare Other

## 2020-03-27 ENCOUNTER — Encounter: Payer: Self-pay | Admitting: Oncology

## 2020-03-29 ENCOUNTER — Other Ambulatory Visit: Payer: Self-pay | Admitting: *Deleted

## 2020-03-30 MED ORDER — XTAMPZA ER 18 MG PO C12A: 1.0000 | EXTENDED_RELEASE_CAPSULE | Freq: Two times a day (BID) | ORAL | 0 refills | Status: DC

## 2020-03-30 MED ORDER — OXYCODONE HCL 10 MG PO TABS: 10.0000 mg | ORAL_TABLET | ORAL | 0 refills | Status: AC | PRN

## 2020-04-03 ENCOUNTER — Ambulatory Visit: Payer: Medicare Other | Admitting: Cardiovascular Disease

## 2020-04-03 ENCOUNTER — Other Ambulatory Visit: Payer: Self-pay | Admitting: Oncology

## 2020-04-04 ENCOUNTER — Ambulatory Visit: Payer: Medicare Other | Admitting: Radiation Oncology

## 2020-04-04 ENCOUNTER — Other Ambulatory Visit: Payer: Self-pay | Admitting: Cardiovascular Disease

## 2020-04-05 ENCOUNTER — Ambulatory Visit
Admission: RE | Admit: 2020-04-05 | Discharge: 2020-04-05 | Disposition: A | Discharge status: Home / Self Care | Payer: Medicare Other | Source: Ambulatory Visit | Attending: Radiation Oncology | Admitting: Radiation Oncology

## 2020-04-05 DIAGNOSIS — Z510 Encounter for antineoplastic radiation therapy: Secondary | ICD-10-CM | POA: Diagnosis not present

## 2020-04-05 DIAGNOSIS — C7951 Secondary malignant neoplasm of bone: Secondary | ICD-10-CM | POA: Diagnosis not present

## 2020-04-05 DIAGNOSIS — C4400 Unspecified malignant neoplasm of skin of lip: Secondary | ICD-10-CM | POA: Diagnosis not present

## 2020-04-05 DIAGNOSIS — C001 Malignant neoplasm of external lower lip: Secondary | ICD-10-CM | POA: Diagnosis not present

## 2020-04-06 DIAGNOSIS — Z510 Encounter for antineoplastic radiation therapy: Secondary | ICD-10-CM | POA: Diagnosis not present

## 2020-04-06 DIAGNOSIS — C4400 Unspecified malignant neoplasm of skin of lip: Secondary | ICD-10-CM | POA: Diagnosis not present

## 2020-04-06 DIAGNOSIS — C001 Malignant neoplasm of external lower lip: Secondary | ICD-10-CM | POA: Diagnosis not present

## 2020-04-06 DIAGNOSIS — C7951 Secondary malignant neoplasm of bone: Secondary | ICD-10-CM | POA: Diagnosis not present

## 2020-04-07 ENCOUNTER — Other Ambulatory Visit: Payer: Self-pay | Admitting: *Deleted

## 2020-04-07 DIAGNOSIS — I951 Orthostatic hypotension: Secondary | ICD-10-CM | POA: Diagnosis not present

## 2020-04-07 DIAGNOSIS — C7951 Secondary malignant neoplasm of bone: Secondary | ICD-10-CM

## 2020-04-07 DIAGNOSIS — J449 Chronic obstructive pulmonary disease, unspecified: Secondary | ICD-10-CM | POA: Diagnosis not present

## 2020-04-10 ENCOUNTER — Encounter: Payer: Self-pay | Admitting: Cardiovascular Disease

## 2020-04-10 ENCOUNTER — Other Ambulatory Visit: Payer: Self-pay

## 2020-04-10 ENCOUNTER — Ambulatory Visit: Admission: RE | Admit: 2020-04-10 | Payer: Medicare Other | Source: Ambulatory Visit

## 2020-04-10 ENCOUNTER — Ambulatory Visit (INDEPENDENT_AMBULATORY_CARE_PROVIDER_SITE_OTHER): Payer: Medicare Other | Admitting: Cardiovascular Disease

## 2020-04-10 DIAGNOSIS — I25810 Atherosclerosis of coronary artery bypass graft(s) without angina pectoris: Secondary | ICD-10-CM

## 2020-04-10 DIAGNOSIS — J439 Emphysema, unspecified: Secondary | ICD-10-CM | POA: Diagnosis not present

## 2020-04-10 DIAGNOSIS — Z85528 Personal history of other malignant neoplasm of kidney: Secondary | ICD-10-CM

## 2020-04-10 DIAGNOSIS — Z720 Tobacco use: Secondary | ICD-10-CM

## 2020-04-10 DIAGNOSIS — C3411 Malignant neoplasm of upper lobe, right bronchus or lung: Secondary | ICD-10-CM

## 2020-04-10 DIAGNOSIS — Z951 Presence of aortocoronary bypass graft: Secondary | ICD-10-CM | POA: Diagnosis not present

## 2020-04-10 DIAGNOSIS — C7951 Secondary malignant neoplasm of bone: Secondary | ICD-10-CM | POA: Diagnosis not present

## 2020-04-10 DIAGNOSIS — C4400 Unspecified malignant neoplasm of skin of lip: Secondary | ICD-10-CM | POA: Diagnosis not present

## 2020-04-10 DIAGNOSIS — C001 Malignant neoplasm of external lower lip: Secondary | ICD-10-CM | POA: Diagnosis not present

## 2020-04-10 DIAGNOSIS — E785 Hyperlipidemia, unspecified: Secondary | ICD-10-CM | POA: Diagnosis not present

## 2020-04-10 DIAGNOSIS — C4402 Squamous cell carcinoma of skin of lip: Secondary | ICD-10-CM

## 2020-04-10 DIAGNOSIS — Z01818 Encounter for other preprocedural examination: Secondary | ICD-10-CM

## 2020-04-10 DIAGNOSIS — Z510 Encounter for antineoplastic radiation therapy: Secondary | ICD-10-CM | POA: Diagnosis not present

## 2020-04-10 DIAGNOSIS — C7A8 Other malignant neuroendocrine tumors: Secondary | ICD-10-CM

## 2020-04-10 NOTE — Patient Instructions (Signed)
Medication Instructions:  CONTINUE WITH CURRENT MEDICATIONS. NO CHANGES.  *If you need a refill on your cardiac medications before your next appointment, please call your pharmacy*   Follow-Up: At [facility], you and your health needs are our priority.  As part of our continuing mission to provide you with exceptional heart care, we have created designated Provider Care Teams.  These Care Teams include your primary Cardiologist (physician) and Advanced Practice Providers (APPs -  Physician Assistants and Nurse Practitioners) who all work together to provide you with the care you need, when you need it.  We recommend signing up for the patient portal called "MyChart".  Sign up information is provided on this After Visit Summary.  MyChart is used to connect with patients for Virtual Visits (Telemedicine).  Patients are able to view lab/test results, encounter notes, upcoming appointments, etc.  Non-urgent messages can be sent to your provider as well.   To learn more about what you can do with MyChart, go to [email or web address].    Your next appointment:   4 month(s)  The format for your next appointment:   In Person  Provider:   You may see Almyra Deforest PA-C   Follow up: WITH DR.KELLY IN 6-8 MO

## 2020-04-10 NOTE — Progress Notes (Signed)
Patient ID: COLTER MAGOWAN, male   DOB: March 28, 1951, 69 y.o.   MRN: [id number]     HPI: TRISTIAN SICKINGER is a 69 y.o. male presents to the office for a 58-monthfollow-up cardiology evaluation.  Mr. LDahirsuffered an acute coronary syndrome in March 2006 and underwent CABG surgery with LIMA to the LAD, vein to the diagonal, vein to the obtuse marginal, and vein to the PDA. In November 2010 he underwent PCI to the ostium of the proximal  graft to the RCA as well as native diagonal since the graft that supplied the diagonal was found to be occluded. His last catheterization in January 2013 showed a patent stent in the vein graft to the RCA, the  LIMA was small and nearly atretic and did not reach the LAD. Vein graft to diagonal was occluded. The vein graft to the marginal vessel was widely patent. Unfortunately, he continue to have significant tobacco use. He has a history of hyperlipidemia, hypertension, and uses supplemental oxygen at night. At times he does note some episodes of chest pain which have improved with initiation of Ranexa therapy.  A nuclear perfusion study on 06/15/2013 was interpreted as a low risk study demonstrating moderate size distal antero-apical infarction with very mild preinfarction ischemia. He did have antero-apical dyskinesis and septal wall motion consistent with his prior sternotomy. He has felt improved.  He quit smoking for very short duration but unfortunately resumed his long-standing habit.  He is smoking one pack per day.  He admits to shortness of breath with activity.  He denies any episodes of chest tightness.  His medical regimen consists of amlodipine 10 mg, isosorbide 60 mg losartan HCT 100/12.5 Toprol-XL 100 mg Ranexa 1000 mg twice a day for his hypertension as well as CAD.  He continues on dual antiplatelet therapy without bleeding issues.  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   L:aboratory from his primary M.D. in March 2018 had  revealed  hemoglobin A1c at 8.5.  His glucose was 282.  Creatinine 1.32.  Lipid studies showed cholesterol 169 with elevation of triglycerides 181, increased VLDL at 36, and an LDL of 98.  He continues to smoke.  He sees Dr. [place]lijio Milesin [place].    I last saw him in March 2019.  At that time he was not eating as well and had lost weight from 214  pounds down to 190.  He was still smoking and was not exercising.  He denied recurrent anginal symptomatology.  I saw him in January 2021 and since his last evaluation with me in March 2019 he was diagnosed with multiple cancers.    He had seen HAlmyra Deforest PUtahin August 2020.  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  [facility] which was consistent with his prior infarct in the anterior wall without associated ischemia.  EF estimate was 53%.  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 He has been undergoing several cycles of chemotherapy and radiation treatment resulting in hair loss.  He was also found to have an 11 cm lower pole right renal mass and was scheduled to undergo robotic assisted laparoscopic nephrectomy by Dr. AHollice Espy  on December 13, 2018.  When I saw him in January he required preoperative clearance.  During that evaluation he denied any chest pain and was unaware of PND orthopnea but had noticed that his pulse was increased.  I recommended initiation of low-dose bisoprolol.  An echo Doppler already on November 08, 2019 showed an EF of 50% with grade 1 diastolic dysfunction, apical hypokinesis and mild biatrial enlargement.  He was seen in February 2021 by Almyra Deforest, PAin follow-up and was given clearance for his nephrectomy.  Subsequently, he has not  radiation oncology treatment by Dr. Berton Mount for his lower lip squamous cell carcinoma.  He continues to smoke cigarettes.  He now is in need to undergo radiation treatments for his spine.  He presents for evaluation. Past Medical History:  Diagnosis Date  . Anxiety   . COPD (chronic obstructive pulmonary disease) ([place])   . Coronary artery disease 2006   Acute coronary syndrome in March 2006.   . Diabetes mellitus without complication ([place])   . Dyslipidemia   . Hypertension 10/09/2010   EF 40-45%  . Malignant neoplasm of upper lobe of right lung ([facility])    Chemo + rad tx's.   . 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 Myocardial infarction ([place])   . Renal cancer, right ([place]) 11/2019   Nephrectomy.  . Sleep apnea    uses cpap machine    Past Surgical History:  Procedure Laterality Date  . Arterial evalation      no evidence of ICA stenosis  . CARDIAC CATHETERIZATION    . CARDIAC SURGERY    . CORONARY ARTERY BYPASS GRAFT  2006   Post CABG surgery with a LIMA to the LAD, a vein to the diagonal ,,a vein to the the obtuse marginal and vein to the PDA.  [name] CORONARY STENT PLACEMENT  2010  . EAR CYST EXCISION Right 10/05/2018   Procedure: Right Partial pinnectomy;  Surgeon: Izora Gala, MD;  Location: [facility];  Service: ENT;  Laterality: Right;  . GRAFT(S) ANGIOGRAM  10/21/2011   Procedure: GRAFT(S) Cyril Loosen;  Surgeon: Leonie Man, MD;  Location: [facility] CATH LAB;  Service: Cardiovascular;;  . LEFT HEART CATHETERIZATION WITH CORONARY ANGIOGRAM N/A 10/21/2011   Procedure: LEFT HEART CATHETERIZATION WITH CORONARY ANGIOGRAM;  Surgeon: Leonie Man, MD;  Location: [facility] CATH LAB;  Service: Cardiovascular;  Laterality: N/A;  . LYMPH NODE BIOPSY Right 10/05/2018   Procedure: sentinel NODE BIOPSY with right partial modified neck dissection;  Surgeon: Izora Gala, MD;  Location: [place];  Service: ENT;  Laterality: Right;  . PORTA CATH INSERTION N/A 07/12/2019   Procedure: PORTA CATH  INSERTION;  Surgeon: Algernon Huxley, MD;  Location: [place] CV LAB;  Service: Cardiovascular;  Laterality: N/A;  . ROBOT ASSISTED LAPAROSCOPIC NEPHRECTOMY Right 12/13/2019   Procedure: XI ROBOTIC ASSISTED LAPAROSCOPIC NEPHRECTOMY;  Surgeon: Hollice Espy, MD;  Location: [facility]S;  Service: Urology;  Laterality: Right;  . SURGERY OF LIP     SKIN CANCER    Allergies  Allergen Reactions  . Codeine Nausea And Vomiting and Other (See Comments)    Pt states when he takes codeine, he throws up blood  . Niacin And Related Other (See Comments)    "made his body feel hot & he had a hard time breathing"    Current Outpatient Medications  Medication Sig Dispense Refill  . amLODipine (NORVASC) 10 MG tablet Take 10 mg by mouth every morning.    [name]  aspirin EC 81 MG tablet Take 81 mg by mouth daily.    [name] atorvastatin (LIPITOR) 40 MG tablet TAKE 1 TABLET (40 MG TOTAL) BY MOUTH AT BEDTIME. 90 tablet 0  . bisoprolol (ZEBETA) 5 MG tablet Take 0.5 tablets (2.5 mg total) by mouth daily for 7 days, THEN 1 tablet (5 mg total) daily. 90 tablet 3  . busPIRone (BUSPAR) 7.5 MG tablet Take 7.5 mg by mouth 2 (two) times daily.     . cyclobenzaprine (FLEXERIL) 5 MG tablet TAKE 1 TABLET BY MOUTH THREE TIMES A DAY AS NEEDED FOR MUSCLE SPASMS 21 tablet 0  . ezetimibe (ZETIA) 10 MG tablet TAKE 1 TABLET BY MOUTH EVERY DAY 90 tablet 2  . Fluticasone-Salmeterol (ADVAIR DISKUS) 500-50 MCG/DOSE AEPB Inhale 1 puff into the lungs 2 (two) times daily.    . Fluticasone-Umeclidin-Vilant (TRELEGY ELLIPTA) 100-62.5-25 MCG/INH AEPB Inhale 1 puff into the lungs daily.    . INCRUSE ELLIPTA 62.5 MCG/INH AEPB Inhale 1 puff into the lungs daily.   2  . Ipratropium-Albuterol (COMBIVENT RESPIMAT) 20-100 MCG/ACT AERS respimat Inhale 1 puff into the lungs every 6 (six) hours as needed for wheezing or shortness of breath.     . isosorbide mononitrate (IMDUR) 60 MG 24 hr tablet TAKE 1 TABLET BY MOUTH EVERY DAY 90 tablet 1  . losartan (COZAAR)  50 MG tablet Take 1 tablet (50 mg total) by mouth daily. 30 tablet 11  . metFORMIN (GLUCOPHAGE) 500 MG tablet TAKE 1 TABLET (500 MG TOTAL) BY MOUTH 2 (TWO) TIMES DAILY WITH A MEAL. (Patient taking differently: Take 500 mg by mouth daily with breakfast. ) 180 tablet 0  . opium-belladonna (B&O SUPPRETTES) 16.2-60 MG suppository Place 1 suppository rectally every 6 (six) hours as needed for bladder spasms. 4 suppository 0  . oxybutynin (DITROPAN) 5 MG tablet Take 1 tablet (5 mg total) by mouth every 8 (eight) hours as needed for bladder spasms. 20 tablet 0  . oxyCODONE ER (XTAMPZA ER) 18 MG C12A Take 1 tablet by mouth every 12 (twelve) hours. 60 capsule 0  . Oxycodone HCl 10 MG TABS Take 1 tablet (10 mg total) by mouth every 4 (four) hours as needed. 120 tablet 0  . oxyCODONE-acetaminophen (PERCOCET/ROXICET) 5-325 MG tablet Take 1-2 tablets by mouth every 4 (four) hours as needed for moderate pain. 20 tablet 0  . ranolazine (RANEXA) 1000 MG SR tablet TAKE 1 TABLET BY MOUTH TWICE A DAY 60 tablet 11   No current facility-administered medications for this visit.   Facility-Administered Medications Ordered in Other Visits  Medication Dose Route Frequency Provider Last Rate Last Admin  . heparin lock flush 100 unit/mL  500 Units Intravenous Once Sindy Guadeloupe, MD      . sodium chloride flush (NS) 0.9 % injection 10 mL  10 mL Intravenous PRN Sindy Guadeloupe, MD      . sodium chloride flush (NS) 0.9 % injection 10 mL  10 mL Intravenous PRN Sindy Guadeloupe, MD   10 mL at 08/31/19 1123    Social History   Socioeconomic History  . Marital status: Married    Spouse name: JoAnne Vila  . Number of children: Not on file  . Years of education: Not on file  . Highest education level: Not on file  Occupational History  . Not on file  Tobacco Use  . Smoking status: Current Every Day Smoker    Packs/day: 2.00    Years: 50.00    Pack years: 100.00  Types: Cigarettes  . Smokeless tobacco: Former Systems developer     Types: Chew    Quit date: 01/23/2013  Vaping Use  . Vaping Use: Never used  Substance and Sexual Activity  . Alcohol use: No  . Drug use: Not Currently  . Sexual activity: Not Currently  Other Topics Concern  . Not on file  Social History Narrative   Lives with son and grandson   Social Determinants of Health   Financial Resource Strain:   . Difficulty of Paying Living Expenses:   Food Insecurity:   . Worried About Charity fundraiser in the Last Year:   . Arboriculturist in the Last Year:   Transportation Needs:   . Film/video editor (Medical):   [name] Lack of Transportation (Non-Medical):   Physical Activity:   . Days of Exercise per Week:   . Minutes of Exercise per Session:   Stress:   . Feeling of Stress :   Social Connections:   . Frequency of Communication with Friends and Family:   . Frequency of Social Gatherings with Friends and Family:   . Attends Religious Services:   . Active Member of Clubs or Organizations:   . Attends Archivist Meetings:   [name] Marital Status:   Intimate Partner Violence:   . Fear of Current or Ex-Partner:   . Emotionally Abused:   [name] Physically Abused:   . Sexually Abused:     Family History  Problem Relation Age of Onset  . Hypertension Mother   . Heart attack Mother   . Heart attack Father 66   Socially he continues to smoke.  He has been under increased stress.  His wife recently left him.  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 Endocrine: Positive for poorly controlled diabetes Neuro: Positive for neuropathy Skin: Negative; No rashes or skin lesions Psychiatric: Negative; No behavioral problems, depression Sleep: Positive for OSA on CPAP Other comprehensive 14 point system review is negative.  PE BP 116/60   Pulse 61   Ht _0  (1.727 m)   Wt 168 lb 12.8 oz (76.6 kg)   SpO2 96%   BMI 25.67 kg/m .  Repeat blood pressure by me 122/70  Wt Readings from Last 3 Encounters:  04/10/20 168 lb 12.8 oz (76.6 kg)  03/06/20 166 lb 14.4 oz (75.7 kg)  02/29/20 168 lb 1.6 oz (76.2 kg)   General: Alert, oriented, no distress.  Skin: normal turgor, no rashes, warm and dry HEENT: Normocephalic, atraumatic. Pupils equal round and reactive to light; sclera anicteric; extraocular muscles intact;  Nose without nasal septal hypertrophy Mouth/Parynx benign; Mallinpatti scale 3 Neck: No JVD, no carotid bruits; normal carotid upstroke Lungs: clear to ausculatation and percussion; no wheezing or rales Chest wall: without tenderness to palpitation Heart: PMI not displaced, RRR, s1 s2 normal, 1/6 systolic murmur, no diastolic murmur, no rubs, gallops, thrills, or heaves Abdomen: soft, nontender; no hepatosplenomehaly, BS+; abdominal aorta nontender and not dilated by palpation. Back: no CVA tenderness Pulses 2+ Musculoskeletal: full range of motion,  normal strength, no joint deformities Extremities: no clubbing cyanosis or edema, Homan's sign negative  Neurologic: grossly nonfocal; Cranial nerves grossly wnl Psychologic: Normal mood and affect   ECG (independently read by me): NSR at 61; NSTT changes; QTc 463 msec  January 2021 ECG (independently read by me): Sinus tachycardia at 102 bpm.  Isolated PVC.  QTc interval 466 ms.  PR interval 126 ms  March 2019 ECG (independently read by me): Normal sinus rhythm at 68 bpm.  No significant ST-T  changes.  No ectopy.   October 2018 ECG (independently read by me): Normal sinus rhythm at 65 bpm.  Nonspecific ST changes.  QTc interval 455 ms.  March 2018 ECG (independently read by me):Normal sinus rhythm at 67 bpm.  Nonspecific ST changes.  December 2016 ECG (independently read by me): Normal sinus rhythm at 69 bpm.  Nonspecific T changes.  QTc interval 469 ms.  June 2016 ECG (independently read by me): Sinus bradycardia 59 bpm. Anterolateral ST changes  November 2015 ECG (independently read by me): Sinus rhythm at 69 bpm.  Nonspecific ST changes.  No ectopy.  QTc interval 458 ms  ECG (independently read by me): Sinus rhythm at 59 beats per minute. Nondiagnostic anterolateral T changes  LABS:  BMP Latest Ref Rng & Units 03/03/2020 02/29/2020 01/12/2020  Glucose 70 - 99 mg/dL 154(H) 106(H) 118(H)  BUN 8 - 23 mg/dL _0 Creatinine 0.61 - 1.24 mg/dL 1.82(H) 1.69(H) 1.71(H)  BUN/Creat Ratio 10 - 24 - - -  Sodium 135 - 145 mmol/L 137 136 134(L)  Potassium 3.5 - 5.1 mmol/L 3.9 4.5 4.2  Chloride 98 - 111 mmol/L 100 100 102  CO2 22 - 32 mmol/L _1 Calcium 8.9 - 10.3 mg/dL 9.4 9.2 8.8(L)   Hepatic Function Latest Ref Rng & Units 03/03/2020 02/29/2020 01/12/2020  Total Protein 6.5 - 8.1 g/dL 7.7 7.7 7.2  Albumin 3.5 - 5.0 g/dL 3.9 3.8 3.5  AST 15 - 41 U/L _2 ALT 0 - 44 U/L _3 Alk Phosphatase 38 - 126 U/L 108 111 92  Total Bilirubin 0.3 - 1.2 mg/dL 0.7 0.8 0.5   CBC Latest Ref Rng & Units 03/03/2020 02/29/2020 01/12/2020  WBC 4.0 - 10.5 K/uL 23.3(H) 12.3(H) 13.7(H)  Hemoglobin 13.0 - 17.0 g/dL 11.8(L) 11.4(L) 10.8(L)  Hematocrit 39 - 52 % 35.8(L) 34.9(L) 34.1(L)  Platelets 150 - 400 K/uL 297 272 290   Lab Results  Component Value Date   MCV 88.8 03/03/2020   MCV 89.5 02/29/2020   MCV 91.2 01/12/2020   Lab Results  Component Value Date   TSH 3.350 11/15/2019   Lipid Panel     Component Value Date/Time   CHOL 100 11/15/2019 0827   TRIG 109 11/15/2019  0827   HDL 29 (L) 11/15/2019 0827   CHOLHDL 3.4 11/15/2019 0827   CHOLHDL 4.8 01/03/2017 1303   VLDL 36 (H) 01/03/2017 1303   LDLCALC 51 11/15/2019 0827     IMPRESSION:  1. Coronary artery disease involving coronary bypass graft of native heart without angina pectoris   2. S/P CABG x 4, 2006.PCI 11/10, cath this adm- Med Rx   3. Hyperlipidemia LDL goal <70   4. Pulmonary emphysema, unspecified emphysema type ([place])   5. Malignant neoplasm of upper lobe of right lung ([place])   6. Tobacco abuse   7. Pre-procedural examination   8. Renal carcinoma: Status post nephrectomy   9. Large cell neuroendocrine carcinoma (  [place])   10. Squamous cell carcinoma of lip       ASSESSMENT AND PLAN: Mr. Mathe is a 69 year old male who developed an acute coronary syndrome in March 2006. He underwent CABG revascularization surgery in 2006 and has undergone intervention to the ostium and proximal RCA graft as well as the native diagonal vessel since the graft to this vessel was occluded. He continues to smoke cigarettes and remotely had quit for short duration.  He had smoked 2 packs per day for many years and currently is smoking 1 pack/day.  His most recent [place] study was unchanged from previously and showed a moderate region of scar involving his prior anterior wall without associated ischemia and EF 53%.  Based on that study he was given preoperative clearance for potential surgery last September 2020.  He subsequently been diagnosed with neuroendocrine tumor of the right upper lobe as well as a right renal mass.  He has been undergoing radiation therapy and chemotherapy for his neuroendocrine carcinoma and is since I last saw him underwent successful robotic assisted left nephrectomy by Dr. Hollice Espy on December 13, 2019.  When I last saw him, I initiated low-dose bisoprolol with his resting tachycardia.  His pulse today is 61 on bisoprolol 5 mg daily.  He continues to be on a amlodipine 10 mg,  ranolazine 1000 mg twice a day isosorbide 60 mg losartan 50 mg both for blood pressure and CAD.  Since I last saw him he did have one episode of chest pain which he took sublingual nitroglycerin.  We will be initiating radiation therapy for his spine metastasis.  He continues to be on atorvastatin 40 mg and Zetia for hyperlipidemia with target LDL less than 70.  With his COPD is/emphysema he is on Incruse Ellipta and Trelegy Ellipta as well as Combivent Respimat.  He continues to be on Advair.  Smoking cessation was advised but he has no interest in quitting.  I have recommended he follow-up with Almyra Deforest, PA in 4 months and see me in 8 months for reevaluation.    Time spent: 25 minutes Troy Sine, MD, [facility]  04/12/2020 2:13 PM

## 2020-04-11 ENCOUNTER — Ambulatory Visit
Admission: RE | Admit: 2020-04-11 | Discharge: 2020-04-11 | Disposition: A | Discharge status: Home / Self Care | Payer: Medicare Other | Source: Ambulatory Visit | Attending: Radiation Oncology | Admitting: Radiation Oncology

## 2020-04-11 DIAGNOSIS — C4400 Unspecified malignant neoplasm of skin of lip: Secondary | ICD-10-CM | POA: Diagnosis not present

## 2020-04-11 DIAGNOSIS — Z510 Encounter for antineoplastic radiation therapy: Secondary | ICD-10-CM | POA: Diagnosis not present

## 2020-04-12 ENCOUNTER — Encounter: Payer: Self-pay | Admitting: Cardiovascular Disease

## 2020-04-12 ENCOUNTER — Ambulatory Visit
Admission: RE | Admit: 2020-04-12 | Discharge: 2020-04-12 | Disposition: A | Discharge status: Home / Self Care | Payer: Medicare Other | Source: Ambulatory Visit | Attending: Radiation Oncology | Admitting: Radiation Oncology

## 2020-04-12 DIAGNOSIS — Z510 Encounter for antineoplastic radiation therapy: Secondary | ICD-10-CM | POA: Diagnosis not present

## 2020-04-12 DIAGNOSIS — C4400 Unspecified malignant neoplasm of skin of lip: Secondary | ICD-10-CM | POA: Diagnosis not present

## 2020-04-13 ENCOUNTER — Ambulatory Visit
Admission: RE | Admit: 2020-04-13 | Discharge: 2020-04-13 | Disposition: A | Discharge status: Home / Self Care | Payer: Medicare Other | Source: Ambulatory Visit | Attending: Radiation Oncology | Admitting: Radiation Oncology

## 2020-04-13 DIAGNOSIS — C001 Malignant neoplasm of external lower lip: Secondary | ICD-10-CM | POA: Diagnosis not present

## 2020-04-13 DIAGNOSIS — Z510 Encounter for antineoplastic radiation therapy: Secondary | ICD-10-CM | POA: Insufficient documentation

## 2020-04-13 DIAGNOSIS — C4400 Unspecified malignant neoplasm of skin of lip: Secondary | ICD-10-CM | POA: Diagnosis not present

## 2020-04-13 DIAGNOSIS — C7951 Secondary malignant neoplasm of bone: Secondary | ICD-10-CM | POA: Diagnosis not present

## 2020-04-14 ENCOUNTER — Inpatient Hospital Stay: Payer: Medicare Other | Attending: Oncology

## 2020-04-14 ENCOUNTER — Ambulatory Visit: Payer: Medicare Other

## 2020-04-15 DIAGNOSIS — J449 Chronic obstructive pulmonary disease, unspecified: Secondary | ICD-10-CM | POA: Diagnosis not present

## 2020-04-18 ENCOUNTER — Emergency Department: Payer: Medicare Other

## 2020-04-18 ENCOUNTER — Ambulatory Visit: Payer: Medicare Other

## 2020-04-18 ENCOUNTER — Telehealth: Payer: Self-pay | Admitting: *Deleted

## 2020-04-18 ENCOUNTER — Inpatient Hospital Stay
Admission: EM | Admit: 2020-04-18 | Discharge: 2020-04-21 | DRG: 871 | Disposition: A | Discharge status: Hospice | Payer: Medicare Other | Attending: Family Medicine | Admitting: Family Medicine

## 2020-04-18 ENCOUNTER — Other Ambulatory Visit: Payer: Self-pay

## 2020-04-18 DIAGNOSIS — C7951 Secondary malignant neoplasm of bone: Secondary | ICD-10-CM | POA: Diagnosis present

## 2020-04-18 DIAGNOSIS — J9622 Acute and chronic respiratory failure with hypercapnia: Secondary | ICD-10-CM

## 2020-04-18 DIAGNOSIS — Z515 Encounter for palliative care: Secondary | ICD-10-CM | POA: Diagnosis not present

## 2020-04-18 DIAGNOSIS — D649 Anemia, unspecified: Secondary | ICD-10-CM | POA: Diagnosis not present

## 2020-04-18 DIAGNOSIS — F17210 Nicotine dependence, cigarettes, uncomplicated: Secondary | ICD-10-CM | POA: Diagnosis present

## 2020-04-18 DIAGNOSIS — Z955 Presence of coronary angioplasty implant and graft: Secondary | ICD-10-CM

## 2020-04-18 DIAGNOSIS — R6521 Severe sepsis with septic shock: Secondary | ICD-10-CM

## 2020-04-18 DIAGNOSIS — I5032 Chronic diastolic (congestive) heart failure: Secondary | ICD-10-CM | POA: Diagnosis present

## 2020-04-18 DIAGNOSIS — E1122 Type 2 diabetes mellitus with diabetic chronic kidney disease: Secondary | ICD-10-CM | POA: Diagnosis present

## 2020-04-18 DIAGNOSIS — Y95 Nosocomial condition: Secondary | ICD-10-CM | POA: Diagnosis present

## 2020-04-18 DIAGNOSIS — Z8249 Family history of ischemic heart disease and other diseases of the circulatory system: Secondary | ICD-10-CM

## 2020-04-18 DIAGNOSIS — Z85819 Personal history of malignant neoplasm of unspecified site of lip, oral cavity, and pharynx: Secondary | ICD-10-CM

## 2020-04-18 DIAGNOSIS — Z86006 Personal history of melanoma in-situ: Secondary | ICD-10-CM

## 2020-04-18 DIAGNOSIS — N179 Acute kidney failure, unspecified: Secondary | ICD-10-CM | POA: Diagnosis present

## 2020-04-18 DIAGNOSIS — M7989 Other specified soft tissue disorders: Secondary | ICD-10-CM | POA: Diagnosis not present

## 2020-04-18 DIAGNOSIS — C7971 Secondary malignant neoplasm of right adrenal gland: Secondary | ICD-10-CM | POA: Diagnosis present

## 2020-04-18 DIAGNOSIS — R404 Transient alteration of awareness: Secondary | ICD-10-CM | POA: Diagnosis not present

## 2020-04-18 DIAGNOSIS — Z20822 Contact with and (suspected) exposure to covid-19: Secondary | ICD-10-CM | POA: Diagnosis not present

## 2020-04-18 DIAGNOSIS — J440 Chronic obstructive pulmonary disease with acute lower respiratory infection: Secondary | ICD-10-CM | POA: Diagnosis present

## 2020-04-18 DIAGNOSIS — E119 Type 2 diabetes mellitus without complications: Secondary | ICD-10-CM

## 2020-04-18 DIAGNOSIS — C7800 Secondary malignant neoplasm of unspecified lung: Secondary | ICD-10-CM | POA: Diagnosis not present

## 2020-04-18 DIAGNOSIS — Z9221 Personal history of antineoplastic chemotherapy: Secondary | ICD-10-CM

## 2020-04-18 DIAGNOSIS — C3490 Malignant neoplasm of unspecified part of unspecified bronchus or lung: Secondary | ICD-10-CM | POA: Diagnosis not present

## 2020-04-18 DIAGNOSIS — Z85820 Personal history of malignant melanoma of skin: Secondary | ICD-10-CM

## 2020-04-18 DIAGNOSIS — J189 Pneumonia, unspecified organism: Secondary | ICD-10-CM | POA: Diagnosis present

## 2020-04-18 DIAGNOSIS — Z7982 Long term (current) use of aspirin: Secondary | ICD-10-CM

## 2020-04-18 DIAGNOSIS — C3410 Malignant neoplasm of upper lobe, unspecified bronchus or lung: Secondary | ICD-10-CM | POA: Diagnosis not present

## 2020-04-18 DIAGNOSIS — Z905 Acquired absence of kidney: Secondary | ICD-10-CM

## 2020-04-18 DIAGNOSIS — C7972 Secondary malignant neoplasm of left adrenal gland: Secondary | ICD-10-CM | POA: Diagnosis not present

## 2020-04-18 DIAGNOSIS — Z7984 Long term (current) use of oral hypoglycemic drugs: Secondary | ICD-10-CM

## 2020-04-18 DIAGNOSIS — F17200 Nicotine dependence, unspecified, uncomplicated: Secondary | ICD-10-CM | POA: Diagnosis present

## 2020-04-18 DIAGNOSIS — N1831 Chronic kidney disease, stage 3a: Secondary | ICD-10-CM | POA: Diagnosis not present

## 2020-04-18 DIAGNOSIS — I2510 Atherosclerotic heart disease of native coronary artery without angina pectoris: Secondary | ICD-10-CM | POA: Diagnosis present

## 2020-04-18 DIAGNOSIS — F418 Other specified anxiety disorders: Secondary | ICD-10-CM | POA: Diagnosis not present

## 2020-04-18 DIAGNOSIS — I252 Old myocardial infarction: Secondary | ICD-10-CM

## 2020-04-18 DIAGNOSIS — R410 Disorientation, unspecified: Secondary | ICD-10-CM | POA: Diagnosis not present

## 2020-04-18 DIAGNOSIS — I10 Essential (primary) hypertension: Secondary | ICD-10-CM

## 2020-04-18 DIAGNOSIS — Z85528 Personal history of other malignant neoplasm of kidney: Secondary | ICD-10-CM

## 2020-04-18 DIAGNOSIS — I499 Cardiac arrhythmia, unspecified: Secondary | ICD-10-CM | POA: Diagnosis not present

## 2020-04-18 DIAGNOSIS — J449 Chronic obstructive pulmonary disease, unspecified: Secondary | ICD-10-CM

## 2020-04-18 DIAGNOSIS — J90 Pleural effusion, not elsewhere classified: Secondary | ICD-10-CM | POA: Diagnosis not present

## 2020-04-18 DIAGNOSIS — C649 Malignant neoplasm of unspecified kidney, except renal pelvis: Secondary | ICD-10-CM | POA: Diagnosis not present

## 2020-04-18 DIAGNOSIS — Z66 Do not resuscitate: Secondary | ICD-10-CM | POA: Diagnosis present

## 2020-04-18 DIAGNOSIS — Z8589 Personal history of malignant neoplasm of other organs and systems: Secondary | ICD-10-CM | POA: Diagnosis not present

## 2020-04-18 DIAGNOSIS — J9621 Acute and chronic respiratory failure with hypoxia: Secondary | ICD-10-CM | POA: Diagnosis present

## 2020-04-18 DIAGNOSIS — Z951 Presence of aortocoronary bypass graft: Secondary | ICD-10-CM

## 2020-04-18 DIAGNOSIS — D3A8 Other benign neuroendocrine tumors: Secondary | ICD-10-CM | POA: Diagnosis not present

## 2020-04-18 DIAGNOSIS — I517 Cardiomegaly: Secondary | ICD-10-CM | POA: Diagnosis not present

## 2020-04-18 DIAGNOSIS — C7801 Secondary malignant neoplasm of right lung: Secondary | ICD-10-CM | POA: Diagnosis present

## 2020-04-18 DIAGNOSIS — Z85118 Personal history of other malignant neoplasm of bronchus and lung: Secondary | ICD-10-CM

## 2020-04-18 DIAGNOSIS — E785 Hyperlipidemia, unspecified: Secondary | ICD-10-CM | POA: Diagnosis present

## 2020-04-18 DIAGNOSIS — A419 Sepsis, unspecified organism: Secondary | ICD-10-CM

## 2020-04-18 DIAGNOSIS — Z79899 Other long term (current) drug therapy: Secondary | ICD-10-CM

## 2020-04-18 DIAGNOSIS — G9341 Metabolic encephalopathy: Secondary | ICD-10-CM | POA: Diagnosis not present

## 2020-04-18 DIAGNOSIS — C3411 Malignant neoplasm of upper lobe, right bronchus or lung: Secondary | ICD-10-CM | POA: Diagnosis not present

## 2020-04-18 DIAGNOSIS — Z743 Need for continuous supervision: Secondary | ICD-10-CM | POA: Diagnosis not present

## 2020-04-18 DIAGNOSIS — R0902 Hypoxemia: Secondary | ICD-10-CM | POA: Diagnosis not present

## 2020-04-18 DIAGNOSIS — D631 Anemia in chronic kidney disease: Secondary | ICD-10-CM | POA: Diagnosis present

## 2020-04-18 DIAGNOSIS — I130 Hypertensive heart and chronic kidney disease with heart failure and stage 1 through stage 4 chronic kidney disease, or unspecified chronic kidney disease: Secondary | ICD-10-CM | POA: Diagnosis not present

## 2020-04-18 LAB — URINALYSIS, COMPLETE (UACMP) WITH MICROSCOPIC
Bilirubin Urine: NEGATIVE
Glucose, UA: NEGATIVE mg/dL
Ketones, ur: NEGATIVE mg/dL
Leukocytes,Ua: NEGATIVE
Nitrite: NEGATIVE
Protein, ur: 30 mg/dL — AB
Specific Gravity, Urine: 1.019 (ref 1.005–1.030)
pH: 5 (ref 5.0–8.0)

## 2020-04-18 LAB — BRAIN NATRIURETIC PEPTIDE: B Natriuretic Peptide: 325.7 pg/mL — ABNORMAL HIGH (ref 0.0–100.0)

## 2020-04-18 LAB — CBC WITH DIFFERENTIAL/PLATELET
Abs Immature Granulocytes: 0.1 10*3/uL — ABNORMAL HIGH (ref 0.00–0.07)
Basophils Absolute: 0.1 10*3/uL (ref 0.0–0.1)
Basophils Relative: 0 %
Eosinophils Absolute: 0 10*3/uL (ref 0.0–0.5)
Eosinophils Relative: 0 %
HCT: 29.4 % — ABNORMAL LOW (ref 39.0–52.0)
Hemoglobin: 9.7 g/dL — ABNORMAL LOW (ref 13.0–17.0)
Immature Granulocytes: 1 %
Lymphocytes Relative: 7 %
Lymphs Abs: 1 10*3/uL (ref 0.7–4.0)
MCH: 31 pg (ref 26.0–34.0)
MCHC: 33 g/dL (ref 30.0–36.0)
MCV: 93.9 fL (ref 80.0–100.0)
Monocytes Absolute: 1.8 10*3/uL — ABNORMAL HIGH (ref 0.1–1.0)
Monocytes Relative: 14 %
Neutro Abs: 10.6 10*3/uL — ABNORMAL HIGH (ref 1.7–7.7)
Neutrophils Relative %: 78 %
Platelets: 220 10*3/uL (ref 150–400)
RBC: 3.13 MIL/uL — ABNORMAL LOW (ref 4.22–5.81)
RDW: 15.6 % — ABNORMAL HIGH (ref 11.5–15.5)
WBC: 13.6 10*3/uL — ABNORMAL HIGH (ref 4.0–10.5)
nRBC: 0 % (ref 0.0–0.2)

## 2020-04-18 LAB — PROTIME-INR
INR: 1.2 (ref 0.8–1.2)
Prothrombin Time: 14.5 seconds (ref 11.4–15.2)

## 2020-04-18 LAB — COMPREHENSIVE METABOLIC PANEL
ALT: 17 U/L (ref 0–44)
AST: 56 U/L — ABNORMAL HIGH (ref 15–41)
Albumin: 3.2 g/dL — ABNORMAL LOW (ref 3.5–5.0)
Alkaline Phosphatase: 85 U/L (ref 38–126)
Anion gap: 11 (ref 5–15)
BUN: 33 mg/dL — ABNORMAL HIGH (ref 8–23)
CO2: 25 mmol/L (ref 22–32)
Calcium: 8.4 mg/dL — ABNORMAL LOW (ref 8.9–10.3)
Chloride: 98 mmol/L (ref 98–111)
Creatinine, Ser: 2.45 mg/dL — ABNORMAL HIGH (ref 0.61–1.24)
GFR calc Af Amer: 30 mL/min — ABNORMAL LOW (ref >60)
GFR calc non Af Amer: 26 mL/min — ABNORMAL LOW (ref >60)
Glucose, Bld: 92 mg/dL (ref 70–99)
Potassium: 4.7 mmol/L (ref 3.5–5.1)
Sodium: 134 mmol/L — ABNORMAL LOW (ref 135–145)
Total Bilirubin: 1.1 mg/dL (ref 0.3–1.2)
Total Protein: 6.7 g/dL (ref 6.5–8.1)

## 2020-04-18 LAB — GLUCOSE, CAPILLARY
Glucose-Capillary: 74 mg/dL (ref 70–99)
Glucose-Capillary: 120 mg/dL — ABNORMAL HIGH (ref 70–99)

## 2020-04-18 LAB — SARS CORONAVIRUS 2 BY RT PCR (HOSPITAL ORDER, PERFORMED IN ~~LOC~~ HOSPITAL LAB): SARS Coronavirus 2: NEGATIVE

## 2020-04-18 LAB — PROCALCITONIN: Procalcitonin: 0.14 ng/mL

## 2020-04-18 LAB — MRSA PCR SCREENING: MRSA by PCR: NEGATIVE

## 2020-04-18 LAB — LIPASE, BLOOD: Lipase: 16 U/L (ref 11–51)

## 2020-04-18 LAB — LACTIC ACID, PLASMA
Lactic Acid, Venous: 0.8 mmol/L (ref 0.5–1.9)
Lactic Acid, Venous: 0.7 mmol/L (ref 0.5–1.9)

## 2020-04-18 LAB — APTT: aPTT: 33 seconds (ref 24–36)

## 2020-04-18 MED ORDER — INSULIN ASPART 100 UNIT/ML ~~LOC~~ SOLN
0.0000 [IU] | Freq: Three times a day (TID) | SUBCUTANEOUS | Status: DC
  Administered 2020-04-19: 3 [IU] via SUBCUTANEOUS
  Filled 2020-04-18: qty 1

## 2020-04-18 MED ORDER — ATORVASTATIN CALCIUM 20 MG PO TABS
40.0000 mg | ORAL_TABLET | Freq: Every day | ORAL | Status: DC
  Administered 2020-04-18 – 2020-04-20 (×4): 40 mg via ORAL
  Filled 2020-04-18 (×3): qty 2

## 2020-04-18 MED ORDER — VANCOMYCIN HCL IN DEXTROSE 1-5 GM/200ML-% IV SOLN
1000.0000 mg | Freq: Once | INTRAVENOUS | Status: AC
  Administered 2020-04-18: 1000 mg via INTRAVENOUS
  Filled 2020-04-18: qty 200

## 2020-04-18 MED ORDER — ALPRAZOLAM 0.25 MG PO TABS
0.2500 mg | ORAL_TABLET | Freq: Three times a day (TID) | ORAL | Status: DC
  Administered 2020-04-18 – 2020-04-21 (×10): 0.25 mg via ORAL
  Filled 2020-04-18 (×10): qty 1

## 2020-04-18 MED ORDER — SODIUM CHLORIDE 0.9 % IV BOLUS (SEPSIS)
500.0000 mL | Freq: Once | INTRAVENOUS | Status: AC
  Administered 2020-04-18: 500 mL via INTRAVENOUS

## 2020-04-18 MED ORDER — EZETIMIBE 10 MG PO TABS
10.0000 mg | ORAL_TABLET | Freq: Every day | ORAL | Status: DC
  Administered 2020-04-19: 10 mg via ORAL
  Filled 2020-04-18: qty 1

## 2020-04-18 MED ORDER — ENOXAPARIN SODIUM 30 MG/0.3ML ~~LOC~~ SOLN
30.0000 mg | SUBCUTANEOUS | Status: DC
  Administered 2020-04-18: 30 mg via SUBCUTANEOUS
  Filled 2020-04-18: qty 0.3

## 2020-04-18 MED ORDER — SODIUM CHLORIDE 0.9 % IV SOLN
2.0000 g | Freq: Once | INTRAVENOUS | Status: AC
  Administered 2020-04-18: 2 g via INTRAVENOUS
  Filled 2020-04-18: qty 2

## 2020-04-18 MED ORDER — OXYCODONE HCL 5 MG PO TABS
10.0000 mg | ORAL_TABLET | ORAL | Status: DC | PRN
  Administered 2020-04-19 – 2020-04-21 (×4): 10 mg via ORAL
  Filled 2020-04-18 (×4): qty 2

## 2020-04-18 MED ORDER — INSULIN ASPART 100 UNIT/ML ~~LOC~~ SOLN: 0.0000 [IU] | Freq: Every day | SUBCUTANEOUS | Status: DC

## 2020-04-18 MED ORDER — ACETAMINOPHEN 325 MG PO TABS: 650.0000 mg | ORAL_TABLET | Freq: Four times a day (QID) | ORAL | Status: DC | PRN

## 2020-04-18 MED ORDER — VANCOMYCIN HCL 500 MG/100ML IV SOLN
500.0000 mg | Freq: Once | INTRAVENOUS | Status: AC
  Administered 2020-04-18: 500 mg via INTRAVENOUS
  Filled 2020-04-18: qty 100

## 2020-04-18 MED ORDER — IPRATROPIUM-ALBUTEROL 0.5-2.5 (3) MG/3ML IN SOLN
3.0000 mL | Freq: Once | RESPIRATORY_TRACT | Status: AC
  Administered 2020-04-18: 3 mL via RESPIRATORY_TRACT
  Filled 2020-04-18: qty 3

## 2020-04-18 MED ORDER — ENOXAPARIN SODIUM 40 MG/0.4ML ~~LOC~~ SOLN: 40.0000 mg | SUBCUTANEOUS | Status: DC

## 2020-04-18 MED ORDER — BUSPIRONE HCL 15 MG PO TABS
7.5000 mg | ORAL_TABLET | Freq: Two times a day (BID) | ORAL | Status: DC
  Administered 2020-04-18 – 2020-04-20 (×5): 7.5 mg via ORAL
  Filled 2020-04-18 (×6): qty 1
  Filled 2020-04-18: qty 2

## 2020-04-18 MED ORDER — ASPIRIN EC 81 MG PO TBEC
81.0000 mg | DELAYED_RELEASE_TABLET | Freq: Every day | ORAL | Status: DC
  Administered 2020-04-18 – 2020-04-21 (×4): 81 mg via ORAL
  Filled 2020-04-18 (×4): qty 1

## 2020-04-18 MED ORDER — PIPERACILLIN-TAZOBACTAM 3.375 G IVPB
3.3750 g | Freq: Three times a day (TID) | INTRAVENOUS | Status: DC
  Administered 2020-04-18 – 2020-04-21 (×9): 3.375 g via INTRAVENOUS
  Filled 2020-04-18 (×9): qty 50

## 2020-04-18 MED ORDER — RANOLAZINE ER 500 MG PO TB12
1000.0000 mg | ORAL_TABLET | Freq: Two times a day (BID) | ORAL | Status: DC
  Administered 2020-04-19 – 2020-04-20 (×4): 1000 mg via ORAL
  Filled 2020-04-18 (×7): qty 2

## 2020-04-18 MED ORDER — IPRATROPIUM-ALBUTEROL 0.5-2.5 (3) MG/3ML IN SOLN
3.0000 mL | RESPIRATORY_TRACT | Status: DC
  Administered 2020-04-18 – 2020-04-19 (×6): 3 mL via RESPIRATORY_TRACT
  Filled 2020-04-18 (×6): qty 3

## 2020-04-18 MED ORDER — ONDANSETRON HCL 4 MG/2ML IJ SOLN: 4.0000 mg | Freq: Three times a day (TID) | INTRAMUSCULAR | Status: DC | PRN

## 2020-04-18 MED ORDER — CYCLOBENZAPRINE HCL 10 MG PO TABS
5.0000 mg | ORAL_TABLET | Freq: Three times a day (TID) | ORAL | Status: DC | PRN
  Administered 2020-04-19: 5 mg via ORAL
  Filled 2020-04-18: qty 1

## 2020-04-18 MED ORDER — ALBUTEROL SULFATE (2.5 MG/3ML) 0.083% IN NEBU
5.0000 mg | INHALATION_SOLUTION | Freq: Once | RESPIRATORY_TRACT | Status: AC
  Administered 2020-04-18: 5 mg via RESPIRATORY_TRACT
  Filled 2020-04-18: qty 6

## 2020-04-18 MED ORDER — ALBUTEROL SULFATE (2.5 MG/3ML) 0.083% IN NEBU: 2.5000 mg | INHALATION_SOLUTION | RESPIRATORY_TRACT | Status: DC | PRN

## 2020-04-18 MED ORDER — DM-GUAIFENESIN ER 30-600 MG PO TB12
1.0000 | ORAL_TABLET | Freq: Two times a day (BID) | ORAL | Status: DC
  Administered 2020-04-18 – 2020-04-21 (×6): 1 via ORAL
  Filled 2020-04-18 (×7): qty 1

## 2020-04-18 MED ORDER — NICOTINE 21 MG/24HR TD PT24
21.0000 mg | MEDICATED_PATCH | Freq: Every day | TRANSDERMAL | Status: DC
  Administered 2020-04-19 – 2020-04-20 (×2): 21 mg via TRANSDERMAL
  Filled 2020-04-18 (×4): qty 1

## 2020-04-18 MED ORDER — METHYLPREDNISOLONE SODIUM SUCC 125 MG IJ SOLR
125.0000 mg | Freq: Once | INTRAMUSCULAR | Status: AC
  Administered 2020-04-18: 125 mg via INTRAVENOUS
  Filled 2020-04-18: qty 2

## 2020-04-18 MED ORDER — FLUTICASONE-UMECLIDIN-VILANT 100-62.5-25 MCG/INH IN AEPB: 1.0000 | INHALATION_SPRAY | Freq: Every day | RESPIRATORY_TRACT | Status: DC

## 2020-04-18 NOTE — ED Provider Notes (Signed)
[facility] Emergency Department Provider Note  ____________________________________________  Time seen: Approximately 8:11 AM  I have reviewed the triage vital signs and the nursing notes.   HISTORY  Chief Complaint Altered Mental Status    Level 5 Caveat: Portions of the History and Physical including HPI and review of systems are unable to be completely obtained due to patient being a poor historian   HPI Troy [name] is a 69 y.o. male with a history of COPD, CAD, hypertension, diabetes, small cell lung cancer status post chemotherapy in December 2020 and radiation therapy as recently as last week, who is brought to the ED today due to shortness of breath and altered mental status, gradually developing over the last 4 days and much worse today.    EMS report that on their arrival patient had room air oxygen saturation of 67%, hypotensive with a blood pressure of about 80/50.  They gave 2 L IV saline bolus prior to arrival in the ED with improvement in blood pressure.  End-tidal CO2 was normal.  Patient has shortness of breath and nonproductive cough.  Denies chest pain or leg pain.  No belly pain.  Reports that he has not been eating well over the last 4 days due to malaise, but no acute pain.  No headache or vision change.    Past Medical History:  Diagnosis Date  . Anxiety   . COPD (chronic obstructive pulmonary disease) ([place])   . Coronary artery disease 2006   Acute coronary syndrome in March 2006.   . Diabetes mellitus without complication ([place])   . Dyslipidemia   . Hypertension 10/09/2010   EF 40-45%  . Malignant neoplasm of upper lobe of right lung (HCC)    Chemo + rad tx's.   . 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 Myocardial infarction ([place])   . Renal cancer, right ([place]) 11/2019   Nephrectomy.  . Sleep apnea    uses cpap machine     Patient Active Problem List   Diagnosis Date Noted  . HCAP (healthcare-associated  pneumonia) 04/18/2020  . HLD (hyperlipidemia) 04/18/2020  . COPD (chronic obstructive pulmonary disease) ([place]) 04/18/2020  . Sepsis ([place]) 04/18/2020  . Depression with anxiety 04/18/2020  . CAD (coronary artery disease) 04/18/2020  . Chronic diastolic CHF (congestive heart failure) ([place]) 04/18/2020  . Acute renal failure superimposed on stage 3a chronic kidney disease ([place]) 04/18/2020  . Normocytic anemia 04/18/2020  . Acute on chronic respiratory failure with hypoxia and hypercapnia ([place]) 12/14/2019  . Left nephrolithiasis 12/13/2019  . 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 Goals of care, counseling/discussion 07/05/2019  . Renal mass 07/05/2019  . Malignant neoplasm of upper lobe of right lung ([place]) 07/05/2019  . Melanoma in situ of ear, right ([place]) 10/05/2018  . Cancer of lower lip 09/01/2018  . Melanoma of ear, right ([place]) 09/01/2018  . Skin cancer 06/18/2018  . Smokes 2 packs of cigarettes per day 06/18/2018  . Obesity (BMI 30.0-34.9) 09/05/2014  . Smoking 10/21/2011  . Unstable angina ([place]) 10/18/2011  . S/P CABG x 4, 2006.PCI 11/10, cath this adm- Med Rx 10/18/2011  . HTN (hypertension) 10/18/2011  . Dyslipidemia 10/18/2011  . Pulmonary emphysema ([place]) 10/18/2011  . Cardiomyopathy, ischemic, EF 40-45% 2D 11/10 10/18/2011     Past Surgical History:  Procedure Laterality Date  . Arterial evalation      no evidence of ICA stenosis  . CARDIAC CATHETERIZATION    . CARDIAC SURGERY    .  CORONARY ARTERY BYPASS GRAFT  2006   Post CABG surgery with a LIMA to the LAD, a vein to the diagonal ,,a vein to the the obtuse marginal and vein to the PDA.  [name] CORONARY STENT PLACEMENT  2010  . EAR CYST EXCISION Right 10/05/2018   Procedure: Right Partial pinnectomy;  Surgeon: Izora Gala, MD;  Location: [place];  Service: ENT;  Laterality: Right;  . GRAFT(S) ANGIOGRAM  10/21/2011   Procedure: GRAFT(S) Cyril Loosen;  Surgeon: Leonie Man, MD;  Location: [facility] CATH LAB;   Service: Cardiovascular;;  . LEFT HEART CATHETERIZATION WITH CORONARY ANGIOGRAM N/A 10/21/2011   Procedure: LEFT HEART CATHETERIZATION WITH CORONARY ANGIOGRAM;  Surgeon: Leonie Man, MD;  Location: [facility] CATH LAB;  Service: Cardiovascular;  Laterality: N/A;  . LYMPH NODE BIOPSY Right 10/05/2018   Procedure: sentinel NODE BIOPSY with right partial modified neck dissection;  Surgeon: Izora Gala, MD;  Location: [place];  Service: ENT;  Laterality: Right;  . PORTA CATH INSERTION N/A 07/12/2019   Procedure: PORTA CATH INSERTION;  Surgeon: Algernon Huxley, MD;  Location: [place] CV LAB;  Service: Cardiovascular;  Laterality: N/A;  . ROBOT ASSISTED LAPAROSCOPIC NEPHRECTOMY Right 12/13/2019   Procedure: XI ROBOTIC ASSISTED LAPAROSCOPIC NEPHRECTOMY;  Surgeon: Hollice Espy, MD;  Location: [facility] ORS;  Service: Urology;  Laterality: Right;  . SURGERY OF LIP     SKIN CANCER     Prior to Admission medications   Medication Sig Start Date End Date Taking? Authorizing Provider  ALPRAZolam (XANAX) 0.25 MG tablet Take 0.25 mg by mouth 3 (three) times daily. 04/07/20  Yes [provider]  amLODipine (NORVASC) 10 MG tablet Take 10 mg by mouth every morning. 12/28/19  Yes [provider]  aspirin EC 81 MG tablet Take 81 mg by mouth daily.   Yes [provider]  atorvastatin (LIPITOR) 40 MG tablet TAKE 1 TABLET (40 MG TOTAL) BY MOUTH AT BEDTIME. Patient taking differently: Take 40 mg by mouth at bedtime.  04/04/20  Yes Troy Sine, MD  bisoprolol (ZEBETA) 5 MG tablet Take 5 mg by mouth daily.   Yes [provider]  busPIRone (BUSPAR) 7.5 MG tablet Take 7.5 mg by mouth 2 (two) times daily.  09/04/14  Yes [provider]  cyclobenzaprine (FLEXERIL) 5 MG tablet TAKE 1 TABLET BY MOUTH THREE TIMES A DAY AS NEEDED FOR MUSCLE SPASMS Patient taking differently: Take 5 mg by mouth 3 (three) times daily as needed for muscle spasms.  04/03/20  Yes Sindy Guadeloupe, MD  ezetimibe  (ZETIA) 10 MG tablet TAKE 1 TABLET BY MOUTH EVERY DAY Patient taking differently: Take by mouth daily.  03/20/20  Yes Troy Sine, MD  Fluticasone-Umeclidin-Vilant (TRELEGY ELLIPTA) 100-62.5-25 MCG/INH AEPB Inhale 1 puff into the lungs daily.   Yes [provider]  INCRUSE ELLIPTA 62.5 MCG/INH AEPB Inhale 1 puff into the lungs daily.  12/09/16  Yes [provider]  Ipratropium-Albuterol (COMBIVENT RESPIMAT) 20-100 MCG/ACT AERS respimat Inhale 2 puffs into the lungs every 6 (six) hours.    Yes [provider]  losartan (COZAAR) 50 MG tablet Take 1 tablet (50 mg total) by mouth daily. 12/15/19 12/14/20 Yes Swayze, Ava, DO  metFORMIN (GLUCOPHAGE) 500 MG tablet TAKE 1 TABLET (500 MG TOTAL) BY MOUTH 2 (TWO) TIMES DAILY WITH A MEAL. Patient taking differently: Take 500 mg by mouth daily with breakfast.  09/06/19  Yes Troy Sine, MD  oxyCODONE ER [facility]) 18 MG C12A Take 1 tablet  by mouth every 12 (twelve) hours. 03/30/20  Yes Sindy Guadeloupe, MD  Oxycodone HCl 10 MG TABS Take 1 tablet (10 mg total) by mouth every 4 (four) hours as needed. 03/30/20  Yes Sindy Guadeloupe, MD  ranolazine (RANEXA) 1000 MG SR tablet TAKE 1 TABLET BY MOUTH TWICE A DAY Patient taking differently: Take 1,000 mg by mouth 2 (two) times daily.  06/11/19  Yes Troy Sine, MD     Allergies Codeine and Niacin and related   Family History  Problem Relation Age of Onset  . Hypertension Mother   . Heart attack Mother   . Heart attack Father 34    Social History Social History   Tobacco Use  . Smoking status: Current Every Day Smoker    Packs/day: 2.00    Years: 50.00    Pack years: 100.00    Types: Cigarettes  . Smokeless tobacco: Former Systems developer    Types: Chew    Quit date: 01/23/2013  Vaping Use  . Vaping Use: Never used  Substance Use Topics  . Alcohol use: No  . Drug use: Not Currently    Review of Systems Level 5 Caveat: Portions of the History and Physical including HPI and  review of systems are unable to be completely obtained due to patient being a poor historian   Constitutional:   No known fever.  ENT:   No rhinorrhea. Cardiovascular:   No chest pain or syncope. Respiratory:   Positive shortness of breath and cough. Gastrointestinal:   Negative for abdominal pain, vomiting and diarrhea.  Musculoskeletal:   Negative for focal pain or swelling ____________________________________________   PHYSICAL EXAM:  VITAL SIGNS: ED Triage Vitals [04/18/20 0754]  Enc Vitals Group     BP      Pulse      Resp      Temp      Temp src      SpO2      Weight 168 lb 12.8 oz (76.6 kg)     Height 5\' 8"  (1.727 m)     Head Circumference      Peak Flow      Pain Score 3     Pain Loc      Pain Edu?      Excl. in [place]?     Vital signs reviewed, nursing assessments reviewed.   Constitutional:   Awake and alert, oriented to person and place.  Ill-appearing. Eyes:   Conjunctivae are normal. EOMI. PERRL. ENT      Head:   Normocephalic and atraumatic.      Nose:   No congestion/rhinnorhea.       Mouth/Throat:   Dry mucous membranes, no pharyngeal erythema. No peritonsillar mass.       Neck:   No meningismus. Full ROM. Hematological/Lymphatic/Immunilogical:   No cervical lymphadenopathy. Cardiovascular:   RRR, heart rate 90. Symmetric bilateral radial and DP pulses.  No murmurs. Cap refill less than 2 seconds. Respiratory:   Mild tachypnea, respiratory rate of 22.  Diffusely rhonchorous breath sounds.  Diffuse expiratory wheezing with mildly prolonged expiratory phase. Gastrointestinal:   Soft and nontender. Non distended. There is no CVA tenderness.  No rebound, rigidity, or guarding.  Radiation markers on abdomen appear to target lumbar spine or retroperitoneum Musculoskeletal:   Normal range of motion in all extremities. No joint effusions.  No lower extremity tenderness.  Right leg swelling compared to left with increased calf circumference.  Negative Homans'  sign. Neurologic:  GCS 14 Normal speech and language.  Motor grossly intact. No acute focal neurologic deficits are appreciated.  Skin:    Skin is warm, dry and intact. No rash noted.  No petechiae, purpura, or bullae.  ____________________________________________    LABS (pertinent positives/negatives) (all labs ordered are listed, but only abnormal results are displayed) Labs Reviewed  COMPREHENSIVE METABOLIC PANEL - Abnormal; Notable for the following components:      Result Value   Sodium 134 (*)    BUN 33 (*)    Creatinine, Ser 2.45 (*)    Calcium 8.4 (*)    Albumin 3.2 (*)    AST 56 (*)    GFR calc non Af Amer 26 (*)    GFR calc Af Amer 30 (*)    All other components within normal limits  CBC WITH DIFFERENTIAL/PLATELET - Abnormal; Notable for the following components:   WBC 13.6 (*)    RBC 3.13 (*)    Hemoglobin 9.7 (*)    HCT 29.4 (*)    RDW 15.6 (*)    Neutro Abs 10.6 (*)    Monocytes Absolute 1.8 (*)    Abs Immature Granulocytes 0.10 (*)    All other components within normal limits  BLOOD GAS, VENOUS - Abnormal; Notable for the following components:   pH, Ven 7.22 (*)    pCO2, Ven 64 (*)    All other components within normal limits  SARS CORONAVIRUS 2 BY RT PCR (HOSPITAL ORDER, [facility])  CULTURE, BLOOD (ROUTINE X 2)  CULTURE, BLOOD (ROUTINE X 2)  URINE CULTURE  LACTIC ACID, PLASMA  LIPASE, BLOOD  PROCALCITONIN  APTT  PROTIME-INR  LACTIC ACID, PLASMA  URINALYSIS, COMPLETE (UACMP) WITH MICROSCOPIC   ____________________________________________   EKG    ____________________________________________    RADIOLOGY  CT ABDOMEN PELVIS WO CONTRAST  Result Date: 04/18/2020 CLINICAL DATA:  Shortness of breath, altered mental status and not eating or drinking. 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 EXAM: CT CHEST, ABDOMEN AND PELVIS WITHOUT CONTRAST TECHNIQUE: Multidetector CT imaging of the chest, abdomen  and pelvis was performed following the standard protocol without IV contrast. COMPARISON:  CT scan 03/03/2020 FINDINGS: CT CHEST FINDINGS Cardiovascular: The heart is normal in size. No pericardial effusion. Stable tortuosity and extensive calcification of the thoracic aorta and coronary arteries. Surgical changes from coronary artery bypass surgery. Mediastinum/Nodes: Stable scattered sub 8 mm mediastinal and hilar lymph nodes. No supraclavicular or axillary adenopathy. Lungs/Pleura: Stable advanced emphysematous changes and pulmonary scarring. Progressive pulmonary metastatic disease. Stable radiation changes in the right upper lobe laterally. No obvious recurrent tumor in this location. Right lower lobe airspace consolidation with air bronchograms suggesting pneumonia. The left lung is clear of an acute process. Musculoskeletal: New lytic bone lesion involving the right scapula is noted. There is also a progressive destructive lytic bone lesion involving the left scapula. No obvious sternal or thoracic vertebral body lesions. No obvious rib lesions. CT ABDOMEN PELVIS FINDINGS Hepatobiliary: No obvious hepatic lesions are identified without contrast. Layering high attenuation material in the gallbladder is likely sludge. No common bile duct dilatation. Pancreas: No pancreatic mass, inflammation or ductal dilatation. Spleen: Normal size.  No focal lesions. Adrenals/Urinary Tract: Enlarging bilateral adrenal gland metastasis. Status post right nephrectomy. Stable left renal cysts. The bladder is unremarkable. Stomach/Bowel: The stomach, duodenum, small bowel and colon are grossly normal without contrast. Vascular/Lymphatic: Stable advanced vascular calcifications. No mesenteric or retroperitoneal mass or adenopathy. Reproductive: The prostate gland  and seminal vesicles are unremarkable Other: No pelvic mass or adenopathy. No free pelvic fluid collections. No inguinal mass or adenopathy. No abdominal wall hernia or  subcutaneous lesions. Musculoskeletal: Progressive destructive bone lesion involving the left ischium. Progressive destructive lesion involving the left ilium anteriorly. Slight progression of L3 lytic metastasis. IMPRESSION: 1. Progressive pulmonary metastatic disease. 2. Right lower lobe airspace consolidation with air bronchograms suggesting pneumonia. 3. Enlarging bilateral adrenal gland metastasis. 4. Progressive osseous metastatic disease. 5. Stable radiation changes in the right upper lobe laterally. No obvious recurrent tumor in this location. Aortic Atherosclerosis (ICD10-I70.0) and Emphysema (ICD10-J43.9). Electronically Signed   By: Marijo Sanes M.D.   On: 04/18/2020 10:30   CT Chest Wo Contrast  Result Date: 04/18/2020 CLINICAL DATA:  Shortness of breath, altered mental status and not eating or drinking. 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 EXAM: CT CHEST, ABDOMEN AND PELVIS WITHOUT CONTRAST TECHNIQUE: Multidetector CT imaging of the chest, abdomen and pelvis was performed following the standard protocol without IV contrast. COMPARISON:  CT scan 03/03/2020 FINDINGS: CT CHEST FINDINGS Cardiovascular: The heart is normal in size. No pericardial effusion. Stable tortuosity and extensive calcification of the thoracic aorta and coronary arteries. Surgical changes from coronary artery bypass surgery. Mediastinum/Nodes: Stable scattered sub 8 mm mediastinal and hilar lymph nodes. No supraclavicular or axillary adenopathy. Lungs/Pleura: Stable advanced emphysematous changes and pulmonary scarring. Progressive pulmonary metastatic disease. Stable radiation changes in the right upper lobe laterally. No obvious recurrent tumor in this location. Right lower lobe airspace consolidation with air bronchograms suggesting pneumonia. The left lung is clear of an acute process. Musculoskeletal: New lytic bone lesion involving the right scapula is noted. There is also a progressive destructive  lytic bone lesion involving the left scapula. No obvious sternal or thoracic vertebral body lesions. No obvious rib lesions. CT ABDOMEN PELVIS FINDINGS Hepatobiliary: No obvious hepatic lesions are identified without contrast. Layering high attenuation material in the gallbladder is likely sludge. No common bile duct dilatation. Pancreas: No pancreatic mass, inflammation or ductal dilatation. Spleen: Normal size.  No focal lesions. Adrenals/Urinary Tract: Enlarging bilateral adrenal gland metastasis. Status post right nephrectomy. Stable left renal cysts. The bladder is unremarkable. Stomach/Bowel: The stomach, duodenum, small bowel and colon are grossly normal without contrast. Vascular/Lymphatic: Stable advanced vascular calcifications. No mesenteric or retroperitoneal mass or adenopathy. Reproductive: The prostate gland and seminal vesicles are unremarkable Other: No pelvic mass or adenopathy. No free pelvic fluid collections. No inguinal mass or adenopathy. No abdominal wall hernia or subcutaneous lesions. Musculoskeletal: Progressive destructive bone lesion involving the left ischium. Progressive destructive lesion involving the left ilium anteriorly. Slight progression of L3 lytic metastasis. IMPRESSION: 1. Progressive pulmonary metastatic disease. 2. Right lower lobe airspace consolidation with air bronchograms suggesting pneumonia. 3. Enlarging bilateral adrenal gland metastasis. 4. Progressive osseous metastatic disease. 5. Stable radiation changes in the right upper lobe laterally. No obvious recurrent tumor in this location. Aortic Atherosclerosis (ICD10-I70.0) and Emphysema (ICD10-J43.9). Electronically Signed   By: Marijo Sanes M.D.   On: 04/18/2020 10:30   US Venous Img Lower Unilateral Right  Result Date: 04/18/2020 CLINICAL DATA:  69 year old male with a history swelling EXAM: RIGHT LOWER EXTREMITY VENOUS DOPPLER ULTRASOUND TECHNIQUE: Gray-scale sonography with graded compression, as well as  color Doppler and duplex ultrasound were performed to evaluate the lower extremity deep venous systems from the level of the common femoral vein and including the common femoral, femoral, profunda femoral, popliteal and calf veins including the posterior tibial, peroneal  and gastrocnemius veins when visible. The superficial great saphenous vein was also interrogated. Spectral Doppler was utilized to evaluate flow at rest and with distal augmentation maneuvers in the common femoral, femoral and popliteal veins. COMPARISON:  None. FINDINGS: Contralateral Common Femoral Vein: Respiratory phasicity is normal and symmetric with the symptomatic side. No evidence of thrombus. Normal compressibility. Common Femoral Vein: No evidence of thrombus. Normal compressibility, respiratory phasicity and response to augmentation. Saphenofemoral Junction: No evidence of thrombus. Normal compressibility and flow on color Doppler imaging. Profunda Femoral Vein: No evidence of thrombus. Normal compressibility and flow on color Doppler imaging. Femoral Vein: No evidence of thrombus. Normal compressibility, respiratory phasicity and response to augmentation. Popliteal Vein: No evidence of thrombus. Normal compressibility, respiratory phasicity and response to augmentation. Calf Veins: No evidence of thrombus. Normal compressibility and flow on color Doppler imaging. Superficial Great Saphenous Vein: No evidence of thrombus. Normal compressibility and flow on color Doppler imaging. Other Findings:  None. IMPRESSION: Sonographic survey of the right lower extremity negative for DVT Electronically Signed   By: Corrie Mckusick D.O.   On: 04/18/2020 09:55   DG Chest Port 1 View  Result Date: 04/18/2020 CLINICAL DATA:  ALTERED MENTAL STATUS EXAM: PORTABLE CHEST 1 VIEW COMPARISON:  12/14/2019; 06/25/2019; chest CT-03/03/2020 FINDINGS: Grossly unchanged enlarged cardiac silhouette and mediastinal contours post median sternotomy and CABG. Slight  retraction of a left jugular approach port a catheter with tip now projected over the central aspect of the left innominate vein. The lungs remain hyperexpanded. Ill-defined nodular interstitial opacities involving the peripheral aspect of the right mid lung as well as the right lung base are unchanged. No new discrete focal airspace opacities. Persistent blunting of the right costophrenic angle without definite pleural effusion. Note is made of a crescentic lucency below the left hemidiaphragm. No acute osseous abnormalities. IMPRESSION: 1. Similar findings of cardiomegaly and known right greater than left pulmonary metastatic disease. 2. Crescentic lucency below the left hemidiaphragm may represent air within the stomach, however pneumoperitoneum could have a similar appearance. Correlation for abdominal symptoms is advised. Further evaluation with decubital abdominal radiographic series could be performed as indicated. 3. Continued retraction of left jugular approach port a catheter with tip now projected over the central aspect the left innominate vein. Critical Value/emergent results were called by telephone at the time of interpretation on 04/18/2020 at 8:23 am to provider Senan Urey , who verbally acknowledged these results. Electronically Signed   By: Sandi Mariscal M.D.   On: 04/18/2020 08:22    ____________________________________________   PROCEDURES .Critical Care Performed by: Carrie Mew, MD Authorized by: Carrie Mew, MD   Critical care provider statement:    Critical care time (minutes):  35   Critical care time was exclusive of:  Separately billable procedures and treating other patients   Critical care was necessary to treat or prevent imminent or life-threatening deterioration of the following conditions:  Respiratory failure, sepsis and shock   Critical care was time spent personally by me on the following activities:  Development of treatment plan with patient or  surrogate, discussions with consultants, evaluation of patient's response to treatment, examination of patient, obtaining history from patient or surrogate, ordering and performing treatments and interventions, ordering and review of laboratory studies, ordering and review of radiographic studies, pulse oximetry, re-evaluation of patient's condition and review of old charts    ____________________________________________  DIFFERENTIAL DIAGNOSIS   Pneumonia and sepsis, pulmonary edema, pleural effusion, COPD exacerbation, hypercapnia and CO2 narcosis, pulmonary embolism, right  lower extremity DVT, dehydration  CLINICAL IMPRESSION / ASSESSMENT AND PLAN / ED COURSE  Medications ordered in the ED: Medications  ipratropium-albuterol (DUONEB) 0.5-2.5 (3) MG/3ML nebulizer solution 3 mL (has no administration in time range)  albuterol (PROVENTIL) (2.5 MG/3ML) 0.083% nebulizer solution 2.5 mg (has no administration in time range)  dextromethorphan-guaiFENesin (MUCINEX DM) 30-600 MG per 12 hr tablet 1 tablet (has no administration in time range)  nicotine (NICODERM CQ - dosed in mg/24 hours) patch 21 mg (has no administration in time range)  ondansetron (ZOFRAN) injection 4 mg (has no administration in time range)  acetaminophen (TYLENOL) tablet 650 mg (has no administration in time range)  sodium chloride 0.9 % bolus 500 mL (0 mLs Intravenous Stopped 04/18/20 0934)  vancomycin (VANCOCIN) IVPB 1000 mg/200 mL premix (0 mg Intravenous Stopped 04/18/20 0934)  ceFEPIme (MAXIPIME) 2 g in sodium chloride 0.9 % 100 mL IVPB (0 g Intravenous Stopped 04/18/20 0934)  methylPREDNISolone sodium succinate (SOLU-MEDROL) 125 mg/2 mL injection 125 mg (125 mg Intravenous Given 04/18/20 0930)  ipratropium-albuterol (DUONEB) 0.5-2.5 (3) MG/3ML nebulizer solution 3 mL (3 mLs Nebulization Given 04/18/20 0930)  albuterol (PROVENTIL) (2.5 MG/3ML) 0.083% nebulizer solution 5 mg (5 mg Nebulization Given 04/18/20 0931)  vancomycin  (VANCOREADY) IVPB 500 mg/100 mL (0 mg Intravenous Stopped 04/18/20 1057)    Pertinent labs & imaging results that were available during my care of the patient were reviewed by me and considered in my medical decision making (see chart for details).   Mikal Blasdell Marsicano was evaluated in Emergency Department on 04/18/2020 for the symptoms described in the history of present illness. He was evaluated in the context of the global COVID-19 pandemic, which necessitated consideration that the patient might be at risk for infection with the SARS-CoV-2 virus that causes COVID-19. Institutional protocols and algorithms that pertain to the evaluation of patients at risk for COVID-19 are in a state of rapid change based on information released by regulatory bodies including the CDC and federal and state organizations. These policies and algorithms were followed during the patient's care in the ED.   Patient presents with hypoxia tachypnea hypotension altered mental status, most concerning for septic shock.  Will give an additional 500 mL saline bolus for a full 30 mL/kg IV fluid bolus.  Start sepsis panel and empiric vancomycin and cefepime for HCAP coverage.  Covid swab, chest x-ray, right lower extremity ultrasound.  If chest x-ray nondiagnostic, consider CTA chest to rule out PE.  We will plan to give bronchodilators, plan for admission.  Clinical Course as of Apr 18 1124  Tue Apr 18, 2020  0825 Received call from radiology noting chest x-ray appearance of air under the left hemidiaphragm, possibly in stomach or possibly from pneumoperitoneum.  Patient's abdominal exam is benign, denies abdominal pain.  Clinically this finding is unlikely to be due to pneumoperitoneum but will consider further abdominal imaging if condition/exam changes.   [PS]  1761 CXR without clear airspace disease to explain the pt's presentation. Has AKI on CKD, unable to give IV con for CT. Will obtain non-con CT chest,abd/pelvis for occult pna  or complications of metastatic disease and rad. Tx.    [PS]  1054 CT shows [place] pneumonia   [PS]  1056 IVF 2500 mL completed. Sepsis reassessment completed. Bp normal, cap refill normal, no signs of shock at this time. Vasopressors not indicated.    [PS]    Clinical Course User Index [PS] Carrie Mew, MD    ----------------------------------------- 11:25 AM on  04/18/2020 -----------------------------------------  Family at bedside updated.  Case discussed with hospitalist.  Weaning O2 as tolerated.   ____________________________________________   FINAL CLINICAL IMPRESSION(S) / ED DIAGNOSES    Final diagnoses:  Pneumonia of right lower lobe due to infectious organism  Septic shock ([place])  Malignant neoplasm of lung, unspecified laterality, unspecified part of lung ([place])  Type 2 diabetes mellitus without complication, without long-term current use of insulin [facility])     ED Discharge Orders    None      Portions of this note were generated with dragon dictation software. Dictation errors may occur despite best attempts at proofreading.   Carrie Mew, MD 04/18/20 1125

## 2020-04-18 NOTE — ED Notes (Signed)
No blood cultures available in ED. Lab called and ED will have to send someone to come pick up boxes. Delay in getting cultures due to this.

## 2020-04-18 NOTE — ED Notes (Signed)
Pt sleeping  Oxygen in place.  Family with pt.

## 2020-04-18 NOTE — Telephone Encounter (Signed)
Wife called reporting that patient is in ER and she does not know if patient will make it to his appointment with [place] today.  He went in by EMS with SHORT OF BREATH, O2 sat of 67%, b/p 80/50, AMS, not eating or drinking,

## 2020-04-18 NOTE — ED Notes (Signed)
Pt resting quietly  Oxygen in place  Family with pt.  nsr on monitor.

## 2020-04-18 NOTE — ED Notes (Signed)
Pt went to CT

## 2020-04-18 NOTE — Progress Notes (Signed)
Received 2 liters with EMS, another 900 cc in ED, sepsis protocol fluid requirements met

## 2020-04-18 NOTE — ED Notes (Signed)
Pt awake and talking  Oxygen in place.   Family with pt.   nsr on monitor.

## 2020-04-18 NOTE — Telephone Encounter (Signed)
Ok will f/u

## 2020-04-18 NOTE — ED Notes (Signed)
nsr on monitor.  Iv meds infusing.  Pt drowsy.  Easily aroused.

## 2020-04-18 NOTE — ED Notes (Signed)
Pt sleeping   Family at bedside.

## 2020-04-18 NOTE — ED Triage Notes (Signed)
Pt arrives via [facility] from home with c/o AMs. Family reports pt had cancer treatment 4 days ago and since has had AMS.  Pt coherent with ems. EMs reports O2 at 67% RA. Pt placed on nonrebreather. Pt also hypotensive. Pt given 2L fluids. HR-70, CBg-131   Pt is alert to place but disoriented to time. Pt reoriented easily.

## 2020-04-18 NOTE — Progress Notes (Signed)
CODE SEPSIS - PHARMACY COMMUNICATION  **Broad Spectrum Antibiotics should be administered within 1 hour of Sepsis diagnosis**  Time Code Sepsis Called/Page Received: 3005  Antibiotics Ordered: cefepime and vancomycin  Time of 1st antibiotic administration: 0819  Additional action taken by pharmacy: n/a  If necessary, Name of Provider/Nurse Contacted: n/a    Rocky Morel ,PharmD Clinical Pharmacist  04/18/2020  8:35 AM

## 2020-04-18 NOTE — Consult Note (Signed)
Pharmacy Antibiotic Note  Troy [name] is a 69 y.o. male admitted on 04/18/2020 with HCAP in [place].  Pharmacy has been consulted for pip/tazo and vancomycin dosing.  Plan: Zosyn 3.375g IV q8h (4 hour infusion).   Pt received vancomycin 1500 mg in the ED loading dose. Baseline Scr is 1.6-1.8. Scr this morning was 2.45. Will need to dose by levels. Ordered MRSA PCR, if negative will recommend to d/c vancomycin as it has a negative predictive value of 99.2% for MRSA PNA.    Height: 5\' 8"  (172.7 cm) Weight: 76.6 kg (168 lb 12.8 oz) IBW/kg (Calculated) : 68.4  Temp (24hrs), Avg:98 F (36.7 C), Min:98 F (36.7 C), Max:98 F (36.7 C)  Recent Labs  Lab 04/18/20 0804 04/18/20 1048  WBC 13.6*  --   CREATININE 2.45*  --   LATICACIDVEN 0.8 0.7    Estimated Creatinine Clearance: 26.8 mL/min (A) (by C-G formula based on SCr of 2.45 mg/dL (H)).    Allergies  Allergen Reactions  . Codeine Nausea And Vomiting and Other (See Comments)    Pt states when he takes codeine, he throws up blood  . Niacin And Related Other (See Comments)    "made his body feel hot & he had a hard time breathing"    Dose adjustments this admission: none  Microbiology results: 7/6 BCx: pending  7/6 MRSA PCR: ordered.   Thank you for allowing pharmacy to be a part of this patient's care.  Oswald Hillock, PharmD, BCPS 04/18/2020 1:00 PM

## 2020-04-18 NOTE — ED Notes (Signed)
Family with pt

## 2020-04-18 NOTE — Consult Note (Signed)
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 As a part of his staging work-up he underwent CT chest as well as CT neck. 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 CT neck was unremarkable however CT chest showed a right upper lobe nodule 2.1 x 1.7 cm in with minimal spiculation as well as a 0.5 cm right lower lobe nodule. No mediastinal or hilar adenopathy was noted. Patient was referred to [facility] for further work-up given his preference. He underwent a PET CT scan on 06/10/2019 which showed a hypermetabolic right upper lobe lung nodule measuring 2 cm. Equivocal right infrahilar hypermetabolism with an SUV of 3.1. 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 Large right kidney mass measuring 9.7 x 9 cm. Patient underwent CT-guided lung biopsy which was consistent with large cell neuroendocrine carcinoma. Patient completedconcurrentchemoradiation with carboplatin and etoposide on 09/28/2019.  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 Tumor necrosis present tumor extension into renal sinus. Margins negative. Lymphovascular invasion positive.  Sarcomatoid features not identified. [id number]  Currently patient admitted for hypotension, acute hypoxic respiratory failure and altered mental status.  Patient had a repeat CT chest abdomen and pelvis without contrast which shows bilateral progressive pulmonary metastases.  New osseous metastases involving the ischium and scapula.  I will call bilateral adrenal gland metastases  Patient's wife is at his bedside.  She reports that he has not been doing well especially over the last week or so.  He began to get palliative radiation treatment for his L3 lesion but has had uncontrolled pain in his back as well as bilateral shoulders.  Currently patient is on a nonrebreather and unable to give much history  ECOG PS- 3     Review of systems- Review of Systems  Unable to perform ROS: Medical condition    Allergies  Allergen Reactions  . Codeine Nausea And Vomiting and Other (See Comments)    Pt states when he takes codeine, he throws up blood  . Niacin And Related Other (See Comments)    "made his body feel hot & he had a hard time breathing"    Patient Active Problem List   Diagnosis Date Noted  . HCAP (healthcare-associated pneumonia) 04/18/2020  . HLD (hyperlipidemia) 04/18/2020  . COPD (chronic obstructive pulmonary disease) ([place]) 04/18/2020  . Sepsis ([place]) 04/18/2020  . Depression with anxiety 04/18/2020  . CAD (coronary artery disease) 04/18/2020  . Chronic diastolic CHF (congestive heart failure) ([place]) 04/18/2020  . Acute renal failure superimposed on stage 3a chronic kidney disease ([place]) 04/18/2020  . Normocytic anemia 04/18/2020  . Acute metabolic encephalopathy 95/18/8416  . Malignant neoplasm of lung ([place])   . Acute on chronic  respiratory failure with hypoxia and hypercapnia ([place]) 12/14/2019  . Left nephrolithiasis 12/13/2019  . Large cell neuroendocrine carcinoma ([place]) 07/05/2019  . Goals of care, counseling/discussion 07/05/2019  . Renal mass 07/05/2019  . Malignant  neoplasm of upper lobe of right lung ([place]) 07/05/2019  . Melanoma in situ of ear, right ([place]) 10/05/2018  . Cancer of lower lip 09/01/2018  . Melanoma of ear, right ([place]) 09/01/2018  . Skin cancer 06/18/2018  . Smokes 2 packs of cigarettes per day 06/18/2018  . Obesity (BMI 30.0-34.9) 09/05/2014  . Smoking 10/21/2011  . Unstable angina ([place]) 10/18/2011  . S/P CABG x 4, 2006.PCI 11/10, cath this adm- Med Rx 10/18/2011  . HTN (hypertension) 10/18/2011  . Dyslipidemia 10/18/2011  . Pulmonary emphysema ([place]) 10/18/2011  . Cardiomyopathy, ischemic, EF 40-45% 2D 11/10 10/18/2011     Past Medical History:  Diagnosis Date  . Anxiety   . COPD (chronic obstructive pulmonary disease) ([place])   . Coronary artery disease 2006   Acute coronary syndrome in March 2006.   . Diabetes mellitus without complication ([place])   . Dyslipidemia   . Hypertension 10/09/2010   EF 40-45%  . Malignant neoplasm of upper lobe of right lung ([facility])    Chemo + rad tx's.   . Melanoma in situ of ear, right ([place]) 09/2018  . Myocardial infarction ([place])   . Renal cancer, right ([place]) 11/2019   Nephrectomy.  . Sleep apnea    uses cpap machine     Past Surgical History:  Procedure Laterality Date  . Arterial evalation      no evidence of ICA stenosis  . CARDIAC CATHETERIZATION    . CARDIAC SURGERY    . CORONARY ARTERY BYPASS GRAFT  2006   Post CABG surgery with a LIMA to the LAD, a vein to the diagonal ,,a vein to the the obtuse marginal and vein to the PDA.  [name] CORONARY STENT PLACEMENT  2010  . EAR CYST EXCISION Right 10/05/2018   Procedure: Right Partial pinnectomy;  Surgeon: Izora Gala, MD;  Location: [facility];  Service: ENT;  Laterality: Right;  . GRAFT(S) ANGIOGRAM  10/21/2011   Procedure: GRAFT(S) Cyril Loosen;  Surgeon: Leonie Man, MD;  Location: [facility] CATH LAB;  Service: Cardiovascular;;  . LEFT HEART CATHETERIZATION WITH CORONARY ANGIOGRAM N/A 10/21/2011   Procedure: LEFT HEART CATHETERIZATION WITH CORONARY  ANGIOGRAM;  Surgeon: Leonie Man, MD;  Location: [facility] C[facility] LAB;  Service: Cardiovascular;  Laterality: N/A;  . LYMPH NODE BIOPSY Right 10/05/2018   Procedure: sentinel NODE BIOPSY with right partial modified neck dissection;  Surgeon: Izora Gala, MD;  Location: [facility];  Service: ENT;  Laterality: Right;  . PORTA CATH INSERTION N/A 07/12/2019   Procedure: PORTA CATH INSERTION;  Surgeon: Algernon Huxley, MD;  Location: [facility] CV LAB;  Service: Cardiovascular;  Laterality: N/A;  . ROBOT ASSISTED LAPAROSCOPIC NEPHRECTOMY Right 12/13/2019   Procedure: XI ROBOTIC ASSISTED LAPAROSCOPIC NEPHRECTOMY;  Surgeon: Hollice Espy, MD;  Location: [facility] ORS;  Service: Urology;  Laterality: Right;  . SURGERY OF LIP     SKIN CANCER    Social History   Socioeconomic History  . Marital status: Married    Spouse name: JoAnne Showers  . Number of children: Not on file  . Years of education: Not on file  . Highest education level: Not on file  Occupational History  . Not on file  Tobacco Use  . Smoking status: Current Every Day Smoker    Packs/day: 2.00  Years: 50.00    Pack years: 100.00    Types: Cigarettes  . Smokeless tobacco: Former Systems developer    Types: Chew    Quit date: 01/23/2013  Vaping Use  . Vaping Use: Never used  Substance and Sexual Activity  . Alcohol use: No  . Drug use: Not Currently  . Sexual activity: Not Currently  Other Topics Concern  . Not on file  Social History Narrative   Lives with son and grandson   Social Determinants of Health   Financial Resource Strain:   . Difficulty of Paying Living Expenses:   Food Insecurity:   . Worried About Charity fundraiser in the Last Year:   . Arboriculturist in the Last Year:   Transportation Needs:   . Film/video editor (Medical):   [name] Lack of Transportation (Non-Medical):   Physical Activity:   . Days of Exercise per Week:   . Minutes of Exercise per Session:   Stress:   . Feeling of Stress :   Social Connections:   .  Frequency of Communication with Friends and Family:   . Frequency of Social Gatherings with Friends and Family:   . Attends Religious Services:   . Active Member of Clubs or Organizations:   . Attends Archivist Meetings:   [name] Marital Status:   Intimate Partner Violence:   . Fear of Current or Ex-Partner:   . Emotionally Abused:   [name] Physically Abused:   . Sexually Abused:      Family History  Problem Relation Age of Onset  . Hypertension Mother   . Heart attack Mother   . Heart attack Father 73     Current Facility-Administered Medications:  .  acetaminophen (TYLENOL) tablet 650 mg, 650 mg, Oral, Q6H PRN, Ivor Costa, MD .  albuterol (PROVENTIL) (2.5 MG/3ML) 0.083% nebulizer solution 2.5 mg, 2.5 mg, Nebulization, Q4H PRN, Ivor Costa, MD .  ALPRAZolam Duanne Moron) tablet 0.25 mg, 0.25 mg, Oral, TID, Ivor Costa, MD, 0.25 mg at 04/18/20 1642 .  aspirin EC tablet 81 mg, 81 mg, Oral, Daily, Ivor Costa, MD, 81 mg at 04/18/20 1642 .  atorvastatin (LIPITOR) tablet 40 mg, 40 mg, Oral, QHS, Ivor Costa, MD .  busPIRone (BUSPAR) tablet 7.5 mg, 7.5 mg, Oral, BID, Ivor Costa, MD .  cyclobenzaprine (FLEXERIL) tablet 5 mg, 5 mg, Oral, TID PRN, Ivor Costa, MD .  dextromethorphan-guaiFENesin ([place] DM) 30-600 MG per 12 hr tablet 1 tablet, 1 tablet, Oral, BID, Ivor Costa, MD .  enoxaparin (LOVENOX) injection 40 mg, 40 mg, Subcutaneous, Q24H, Ivor Costa, MD .  ezetimibe (ZETIA) tablet 10 mg, 10 mg, Oral, Daily, Ivor Costa, MD .  Fluticasone-Umeclidin-Vilant 100-62.5-25 MCG/INH AEPB 1 puff, 1 puff, Inhalation, Daily, Ivor Costa, MD .  insulin aspart (novoLOG) injection 0-5 Units, 0-5 Units, Subcutaneous, QHS, Ivor Costa, MD .  insulin aspart (novoLOG) injection 0-9 Units, 0-9 Units, Subcutaneous, TID WC, Ivor Costa, MD .  ipratropium-albuterol (DUONEB) 0.5-2.5 (3) MG/3ML nebulizer solution 3 mL, 3 mL, Nebulization, Q4H, Ivor Costa, MD, 3 mL at 04/18/20 1642 .  nicotine (NICODERM CQ - dosed in  mg/24 hours) patch 21 mg, 21 mg, Transdermal, Daily, Ivor Costa, MD .  ondansetron [facility]) injection 4 mg, 4 mg, Intravenous, Q8H PRN, Ivor Costa, MD .  oxyCODONE (Oxy IR/ROXICODONE) immediate release tablet 10 mg, 10 mg, Oral, Q4H PRN, Ivor Costa, MD .  piperacillin-tazobactam (ZOSYN) IVPB 3.375 g, 3.375 g, Intravenous, Q8H, Oswald Hillock, RPH, Last Rate: 12.5  mL/hr at 04/18/20 1454, 3.375 g at 04/18/20 1454 .  ranolazine (RANEXA) 12 hr tablet 1,000 mg, 1,000 mg, Oral, BID, Ivor Costa, MD  Current Outpatient Medications:  .  ALPRAZolam (XANAX) 0.25 MG tablet, Take 0.25 mg by mouth 3 (three) times daily., Disp: , Rfl:  .  amLODipine (NORVASC) 10 MG tablet, Take 10 mg by mouth every morning., Disp: , Rfl:  .  aspirin EC 81 MG tablet, Take 81 mg by mouth daily., Disp: , Rfl:  .  atorvastatin (LIPITOR) 40 MG tablet, TAKE 1 TABLET (40 MG TOTAL) BY MOUTH AT BEDTIME. (Patient taking differently: Take 40 mg by mouth at bedtime. ), Disp: 90 tablet, Rfl: 0 .  bisoprolol (ZEBETA) 5 MG tablet, Take 5 mg by mouth daily., Disp: , Rfl:  .  busPIRone (BUSPAR) 7.5 MG tablet, Take 7.5 mg by mouth 2 (two) times daily. , Disp: , Rfl:  .  cyclobenzaprine (FLEXERIL) 5 MG tablet, TAKE 1 TABLET BY MOUTH THREE TIMES A DAY AS NEEDED FOR MUSCLE SPASMS (Patient taking differently: Take 5 mg by mouth 3 (three) times daily as needed for muscle spasms. ), Disp: 21 tablet, Rfl: 0 .  ezetimibe (ZETIA) 10 MG tablet, TAKE 1 TABLET BY MOUTH EVERY DAY (Patient taking differently: Take by mouth daily. ), Disp: 90 tablet, Rfl: 2 .  Fluticasone-Umeclidin-Vilant (TRELEGY ELLIPTA) 100-62.5-25 MCG/INH AEPB, Inhale 1 puff into the lungs daily., Disp: , Rfl:  .  INCRUSE ELLIPTA 62.5 MCG/INH AEPB, Inhale 1 puff into the lungs daily. , Disp: , Rfl: 2 .  Ipratropium-Albuterol (COMBIVENT RESPIMAT) 20-100 MCG/ACT AERS respimat, Inhale 2 puffs into the lungs every 6 (six) hours. , Disp: , Rfl:  .  losartan (COZAAR) 50 MG tablet, Take 1 tablet  (50 mg total) by mouth daily., Disp: 30 tablet, Rfl: 11 .  metFORMIN (GLUCOPHAGE) 500 MG tablet, TAKE 1 TABLET (500 MG TOTAL) BY MOUTH 2 (TWO) TIMES DAILY WITH A MEAL. (Patient taking differently: Take 500 mg by mouth daily with breakfast. ), Disp: 180 tablet, Rfl: 0 .  oxyCODONE ER (XTAMPZA ER) 18 MG C12A, Take 1 tablet by mouth every 12 (twelve) hours., Disp: 60 capsule, Rfl: 0 .  Oxycodone HCl 10 MG TABS, Take 1 tablet (10 mg total) by mouth every 4 (four) hours as needed., Disp: 120 tablet, Rfl: 0 .  ranolazine (RANEXA) 1000 MG SR tablet, TAKE 1 TABLET BY MOUTH TWICE A DAY (Patient taking differently: Take 1,000 mg by mouth 2 (two) times daily. ), Disp: 60 tablet, Rfl: 11  Facility-Administered Medications Ordered in Other Encounters:  .  heparin lock flush 100 unit/mL, 500 Units, Intravenous, Once, Randa Evens C, MD .  sodium chloride flush (NS) 0.9 % injection 10 mL, 10 mL, Intravenous, PRN, Sindy Guadeloupe, MD .  sodium chloride flush (NS) 0.9 % injection 10 mL, 10 mL, Intravenous, PRN, Sindy Guadeloupe, MD, 10 mL at 08/31/19 1123   Physical exam:  Vitals:   04/18/20 1642 04/18/20 1643 04/18/20 1644 04/18/20 1645  BP:      Pulse: 74 70 68 68  Resp: 19 (!) 21 16 17   Temp:      SpO2: 100% 100% 100% 100%  Weight:      Height:       Physical Exam Constitutional:      Comments: Patient is on a nonrebreather.  He responds to verbal commands but unable to verbalize  HENT:     Head: Normocephalic and atraumatic.  Cardiovascular:  Rate and Rhythm: Normal rate and regular rhythm.     Heart sounds: Normal heart sounds.  Pulmonary:     Comments: Breath sounds decreased bilaterally diffusely Abdominal:     General: Bowel sounds are normal.     Palpations: Abdomen is soft.  Musculoskeletal:     Cervical back: Normal range of motion.  Skin:    General: Skin is warm and dry.        CMP Latest Ref Rng & Units 04/18/2020  Glucose 70 - 99 mg/dL 92  BUN 8 - 23 mg/dL 33(H)    Creatinine 0.61 - 1.24 mg/dL 2.45(H)  Sodium 135 - 145 mmol/L 134(L)  Potassium 3.5 - 5.1 mmol/L 4.7  Chloride 98 - 111 mmol/L 98  CO2 22 - 32 mmol/L 25  Calcium 8.9 - 10.3 mg/dL 8.4(L)  Total Protein 6.5 - 8.1 g/dL 6.7  Total Bilirubin 0.3 - 1.2 mg/dL 1.1  Alkaline Phos 38 - 126 U/L 85  AST 15 - 41 U/L 56(H)  ALT 0 - 44 U/L 17   CBC Latest Ref Rng & Units 04/18/2020  WBC 4.0 - 10.5 K/uL 13.6(H)  Hemoglobin 13.0 - 17.0 g/dL 9.7(L)  Hematocrit 39 - 52 % 29.4(L)  Platelets 150 - 400 K/uL 220    @IMAGES @  CT ABDOMEN PELVIS WO CONTRAST  Result Date: 04/18/2020 CLINICAL DATA:  Shortness of breath, altered mental status and not eating or drinking. 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 EXAM: CT CHEST, ABDOMEN AND PELVIS WITHOUT CONTRAST TECHNIQUE: Multidetector CT imaging of the chest, abdomen and pelvis was performed following the standard protocol without IV contrast. COMPARISON:  CT scan 03/03/2020 FINDINGS: CT CHEST FINDINGS Cardiovascular: The heart is normal in size. No pericardial effusion. Stable tortuosity and extensive calcification of the thoracic aorta and coronary arteries. Surgical changes from coronary artery bypass surgery. Mediastinum/Nodes: Stable scattered sub 8 mm mediastinal and hilar lymph nodes. No supraclavicular or axillary adenopathy. Lungs/Pleura: Stable advanced emphysematous changes and pulmonary scarring. Progressive pulmonary metastatic disease. Stable radiation changes in the right upper lobe laterally. No obvious recurrent tumor in this location. Right lower lobe airspace consolidation with air bronchograms suggesting pneumonia. The left lung is clear of an acute process. Musculoskeletal: New lytic bone lesion involving the right scapula is noted. There is also a progressive destructive lytic bone lesion involving the left scapula. No obvious sternal or thoracic vertebral body lesions. No obvious rib lesions. CT ABDOMEN PELVIS FINDINGS  Hepatobiliary: No obvious hepatic lesions are identified without contrast. Layering high attenuation material in the gallbladder is likely sludge. No common bile duct dilatation. Pancreas: No pancreatic mass, inflammation or ductal dilatation. Spleen: Normal size.  No focal lesions. Adrenals/Urinary Tract: Enlarging bilateral adrenal gland metastasis. Status post right nephrectomy. Stable left renal cysts. The bladder is unremarkable. Stomach/Bowel: The stomach, duodenum, small bowel and colon are grossly normal without contrast. Vascular/Lymphatic: Stable advanced vascular calcifications. No mesenteric or retroperitoneal mass or adenopathy. Reproductive: The prostate gland and seminal vesicles are unremarkable Other: No pelvic mass or adenopathy. No free pelvic fluid collections. No inguinal mass or adenopathy. No abdominal wall hernia or subcutaneous lesions. Musculoskeletal: Progressive destructive bone lesion involving the left ischium. Progressive destructive lesion involving the left ilium anteriorly. Slight progression of L3 lytic metastasis. IMPRESSION: 1. Progressive pulmonary metastatic disease. 2. Right lower lobe airspace consolidation with air bronchograms suggesting pneumonia. 3. Enlarging bilateral adrenal gland metastasis. 4. Progressive osseous metastatic disease. 5. Stable radiation changes in the right upper lobe laterally. No  obvious recurrent tumor in this location. Aortic Atherosclerosis (ICD10-I70.0) and Emphysema (ICD10-J43.9). Electronically Signed   By: Marijo Sanes M.D.   On: 04/18/2020 10:30   CT Chest Wo Contrast  Result Date: 04/18/2020 CLINICAL DATA:  Shortness of breath, altered mental status and not eating or drinking. 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 EXAM: CT CHEST, ABDOMEN AND PELVIS WITHOUT CONTRAST TECHNIQUE: Multidetector CT imaging of the chest, abdomen and pelvis was performed following the standard protocol without IV contrast. COMPARISON:  CT  scan 03/03/2020 FINDINGS: CT CHEST FINDINGS Cardiovascular: The heart is normal in size. No pericardial effusion. Stable tortuosity and extensive calcification of the thoracic aorta and coronary arteries. Surgical changes from coronary artery bypass surgery. Mediastinum/Nodes: Stable scattered sub 8 mm mediastinal and hilar lymph nodes. No supraclavicular or axillary adenopathy. Lungs/Pleura: Stable advanced emphysematous changes and pulmonary scarring. Progressive pulmonary metastatic disease. Stable radiation changes in the right upper lobe laterally. No obvious recurrent tumor in this location. Right lower lobe airspace consolidation with air bronchograms suggesting pneumonia. The left lung is clear of an acute process. Musculoskeletal: New lytic bone lesion involving the right scapula is noted. There is also a progressive destructive lytic bone lesion involving the left scapula. No obvious sternal or thoracic vertebral body lesions. No obvious rib lesions. CT ABDOMEN PELVIS FINDINGS Hepatobiliary: No obvious hepatic lesions are identified without contrast. Layering high attenuation material in the gallbladder is likely sludge. No common bile duct dilatation. Pancreas: No pancreatic mass, inflammation or ductal dilatation. Spleen: Normal size.  No focal lesions. Adrenals/Urinary Tract: Enlarging bilateral adrenal gland metastasis. Status post right nephrectomy. Stable left renal cysts. The bladder is unremarkable. Stomach/Bowel: The stomach, duodenum, small bowel and colon are grossly normal without contrast. Vascular/Lymphatic: Stable advanced vascular calcifications. No mesenteric or retroperitoneal mass or adenopathy. Reproductive: The prostate gland and seminal vesicles are unremarkable Other: No pelvic mass or adenopathy. No free pelvic fluid collections. No inguinal mass or adenopathy. No abdominal wall hernia or subcutaneous lesions. Musculoskeletal: Progressive destructive bone lesion involving the left  ischium. Progressive destructive lesion involving the left ilium anteriorly. Slight progression of L3 lytic metastasis. IMPRESSION: 1. Progressive pulmonary metastatic disease. 2. Right lower lobe airspace consolidation with air bronchograms suggesting pneumonia. 3. Enlarging bilateral adrenal gland metastasis. 4. Progressive osseous metastatic disease. 5. Stable radiation changes in the right upper lobe laterally. No obvious recurrent tumor in this location. Aortic Atherosclerosis (ICD10-I70.0) and Emphysema (ICD10-J43.9). Electronically Signed   By: Marijo Sanes M.D.   On: 04/18/2020 10:30   US Venous Img Lower Unilateral Right  Result Date: 04/18/2020 CLINICAL DATA:  69 year old male with a history swelling EXAM: RIGHT LOWER EXTREMITY VENOUS DOPPLER ULTRASOUND TECHNIQUE: Gray-scale sonography with graded compression, as well as color Doppler and duplex ultrasound were performed to evaluate the lower extremity deep venous systems from the level of the common femoral vein and including the common femoral, femoral, profunda femoral, popliteal and calf veins including the posterior tibial, peroneal and gastrocnemius veins when visible. The superficial great saphenous vein was also interrogated. Spectral Doppler was utilized to evaluate flow at rest and with distal augmentation maneuvers in the common femoral, femoral and popliteal veins. COMPARISON:  None. FINDINGS: Contralateral Common Femoral Vein: Respiratory phasicity is normal and symmetric with the symptomatic side. No evidence of thrombus. Normal compressibility. Common Femoral Vein: No evidence of thrombus. Normal compressibility, respiratory phasicity and response to augmentation. Saphenofemoral Junction: No evidence of thrombus. Normal compressibility and flow on color Doppler imaging. Profunda Femoral Vein:  No evidence of thrombus. Normal compressibility and flow on color Doppler imaging. Femoral Vein: No evidence of thrombus. Normal compressibility,  respiratory phasicity and response to augmentation. Popliteal Vein: No evidence of thrombus. Normal compressibility, respiratory phasicity and response to augmentation. Calf Veins: No evidence of thrombus. Normal compressibility and flow on color Doppler imaging. Superficial Great Saphenous Vein: No evidence of thrombus. Normal compressibility and flow on color Doppler imaging. Other Findings:  None. IMPRESSION: Sonographic survey of the right lower extremity negative for DVT Electronically Signed   By: Corrie Mckusick D.O.   On: 04/18/2020 09:55   DG Chest Port 1 View  Result Date: 04/18/2020 CLINICAL DATA:  ALTERED MENTAL STATUS EXAM: PORTABLE CHEST 1 VIEW COMPARISON:  12/14/2019; 06/25/2019; chest CT-03/03/2020 FINDINGS: Grossly unchanged enlarged cardiac silhouette and mediastinal contours post median sternotomy and CABG. Slight retraction of a left jugular approach port a catheter with tip now projected over the central aspect of the left innominate vein. The lungs remain hyperexpanded. Ill-defined nodular interstitial opacities involving the peripheral aspect of the right mid lung as well as the right lung base are unchanged. No new discrete focal airspace opacities. Persistent blunting of the right costophrenic angle without definite pleural effusion. Note is made of a crescentic lucency below the left hemidiaphragm. No acute osseous abnormalities. IMPRESSION: 1. Similar findings of cardiomegaly and known right greater than left pulmonary metastatic disease. 2. Crescentic lucency below the left hemidiaphragm may represent air within the stomach, however pneumoperitoneum could have a similar appearance. Correlation for abdominal symptoms is advised. Further evaluation with decubital abdominal radiographic series could be performed as indicated. 3. Continued retraction of left jugular approach port a catheter with tip now projected over the central aspect the left innominate vein. Critical Value/emergent  results were called by telephone at the time of interpretation on 04/18/2020 at 8:23 am to provider PHILLIP STAFFORD , who verbally acknowledged these results. Electronically Signed   By: Sandi Mariscal M.D.   On: 04/18/2020 08:22    Assessment and plan- Patient is a 69 y.o. 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  Patient was also able to understand CT scan findings to some extent but he is unable to verbalize much.  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  It is likely that this is from his renal cell carcinoma rather than lung primary.  However to a certain this he would ideally need tissue diagnosis.  Overall patient's performance status is poor and he has gone through multiple malignancies and treatments for those in the last 1 year.  I do not think he is a candidate for any further systemic treatment at this time.   Patient is a DNR and is currently getting treatment for his pneumonia.  I have explained to the patient's wife that it would be reasonable to consider home hospice at this time and foregoing further plans and biopsies.  Even if his performance status improves and treatment is considered, it would be palliative and not curative and improve his overall survival by a few months but not years.  Currently his prognosis and weeks.  Patient's wife understands and is leaning towards best supportive care.  However she acknowledges  that patient is fearful of conversations revolving around death and dying and would ideally like him to make those decisions for himself.  Today patient is unable to verbalize much and it  remains to be seen if his respiratory status can improve significantly to communicate with [place].  I will have my colleague NP Altha Harm from palliative care see him tomorrow to address his goals of care and ongoing symptoms of pain.  Patient is currently on oxycodone 10 mg every 4 hours as needed for pain but may need as needed IV pain medication   Thank you for this kind referral and the opportunity to participate in the care of this patient   Visit Diagnosis 1. Pneumonia of right lower lobe due to infectious organism   2. Septic shock ([place])   3. Malignant neoplasm of lung, unspecified laterality, unspecified part of lung ([place])   4. Type 2 diabetes mellitus without complication, without long-term current use of insulin ([place])     Dr. Randa Evens, MD, MPH [facility] [phone] 04/18/2020  5:05 PM

## 2020-04-18 NOTE — ED Notes (Signed)
Meds given.  Pt alert.  Oxygen in place.  Iv in place.  nsr on monitor.   fsbs 74  No insulin at this time

## 2020-04-18 NOTE — H&P (Addendum)
History and Physical    Troy [name] EXN:[id number] DOB: 02-15-1951 DOA: 04/18/2020  Referring MD/NP/PA:   PCP: [name], MD   Patient coming from:  The patient is coming from home.  At baseline, pt is partially dependent for most of ADL.        Chief Complaint: AMS, SOB and cough  HPI: Troy [name] is a 69 y.o. 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  Patient has AMS, and is unable to provide accurate medical history, therefore, most of the history is obtained by discussing the case with ED physician, per EMS report, and with the nursing staff. His wife at bedside also provided some history.   Per his wife, pt has hx of metastasized lung cancer.  Patient had chemotherapy.  Currently pt is doing radiation therapy.  He has been having cough, shortness of breath in the past several days.  No chest pain.  No fever or chills.  Patient has nausea and vomited few times with some abdominal pain.  No diarrhea.  Patient has generalized weakness and cannot stand up to walk without help. Not sure if pt has symptoms of UTI. When I saw pt in ED, he was confused, but he could recognize his wife and knows that he is in hospital.  He is not oriented to time.  Moves all extremities.  No facial droop or slurred speech. Per report, patient had oxygen desaturation to 67% on room air and has hypotension with blood pressure 80/50.  Patient was given total of 2.5 L normal saline, blood pressure improved to 97/60.  ED Course: pt was found to have WBC 13.6, lactic acid of 0.8, INR 1.2, PTT 33, negative COVID-19 PCR, worsening renal function, temperature normal, heart rate 81, RR 19, oxygen  saturation 100% on room air. Pt is admitted to progressive bed as inpatient  CT scan of chest and abdomen: 1. Progressive pulmonary metastatic disease. 2. Right lower lobe airspace consolidation with air bronchograms suggesting pneumonia. 3. Enlarging bilateral adrenal gland metastasis. 4. Progressive osseous metastatic disease. 5. Stable radiation changes in the right upper lobe laterally. No obvious recurrent tumor in this location. 6. Aortic Atherosclerosis (ICD10-I70.0) and Emphysema (ICD10-J43.9).  Review of Systems: Could not be reviewed accurately due to altered mental status   Allergy:  Allergies  Allergen Reactions   Codeine Nausea And Vomiting and Other (See Comments)    Pt states when he takes codeine, he throws up blood   Niacin And Related Other (See Comments)    "made his body feel hot & he had a hard time breathing"    Past Medical History:  Diagnosis Date   Anxiety    COPD (chronic obstructive pulmonary disease) ([place])    Coronary artery disease 2006   Acute coronary syndrome in March 2006.    Diabetes mellitus without complication ([place])    Dyslipidemia    Hypertension 10/09/2010   EF 40-45%   Malignant neoplasm of upper lobe of right lung (HCC)    Chemo + rad tx's.    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   Sleep apnea    uses cpap machine  Past Surgical History:  Procedure Laterality Date   Arterial evalation      no evidence of ICA stenosis   CARDIAC CATHETERIZATION     CARDIAC SURGERY     CORONARY ARTERY BYPASS GRAFT  2006   Post CABG surgery with a LIMA to the LAD, a vein to the diagonal ,,a vein to the the obtuse marginal and vein to the PDA.   CORONARY STENT PLACEMENT  2010   EAR CYST EXCISION Right 10/05/2018   Procedure: Right Partial pinnectomy;  Surgeon: Izora Gala, MD;  Location: [facility];  Service: ENT;  Laterality: Right;    GRAFT(S) ANGIOGRAM  10/21/2011   Procedure: GRAFT(S) Cyril Loosen;  Surgeon: Leonie Man, MD;  Location: [facility] CATH LAB;  Service: Cardiovascular;;   LEFT HEART CATHETERIZATION WITH CORONARY ANGIOGRAM N/A 10/21/2011   Procedure: LEFT HEART CATHETERIZATION WITH CORONARY ANGIOGRAM;  Surgeon: Leonie Man, MD;  Location: [facility] CATH LAB;  Service: Cardiovascular;  Laterality: N/A;   LYMPH NODE BIOPSY Right 10/05/2018   Procedure: sentinel NODE BIOPSY with right partial modified neck dissection;  Surgeon: Izora Gala, MD;  Location: [facility];  Service: ENT;  Laterality: Right;   PORTA CATH INSERTION N/A 07/12/2019   Procedure: PORTA CATH INSERTION;  Surgeon: Algernon Huxley, MD;  Location: [facility] CV LAB;  Service: Cardiovascular;  Laterality: N/A;   ROBOT ASSISTED LAPAROSCOPIC NEPHRECTOMY Right 12/13/2019   Procedure: XI ROBOTIC ASSISTED LAPAROSCOPIC NEPHRECTOMY;  Surgeon: Hollice Espy, MD;  Location: [facility] ORS;  Service: Urology;  Laterality: Right;   SURGERY OF LIP     SKIN CANCER    Social History:  reports that he has been smoking cigarettes. He has a 100.00 pack-year smoking history. He quit smokeless tobacco use about 7 years ago.  His smokeless tobacco use included chew. He reports previous drug use. He reports that he does not drink alcohol.  Family History:  Family History  Problem Relation Age of Onset   Hypertension Mother    Heart attack Mother    Heart attack Father 28     Prior to Admission medications   Medication Sig Start Date End Date Taking? Authorizing Provider  ALPRAZolam (XANAX) 0.25 MG tablet Take 0.25 mg by mouth 3 (three) times daily. 04/07/20  Yes [provider]  amLODipine (NORVASC) 10 MG tablet Take 10 mg by mouth every morning. 12/28/19  Yes [provider]  aspirin EC 81 MG tablet Take 81 mg by mouth daily.   Yes [provider]  atorvastatin (LIPITOR) 40 MG tablet TAKE 1 TABLET (40 MG TOTAL) BY MOUTH AT BEDTIME. Patient taking  differently: Take 40 mg by mouth at bedtime.  04/04/20  Yes Troy Sine, MD  bisoprolol (ZEBETA) 5 MG tablet Take 5 mg by mouth daily.   Yes [provider]  busPIRone (BUSPAR) 7.5 MG tablet Take 7.5 mg by mouth 2 (two) times daily.  09/04/14  Yes [provider]  cyclobenzaprine (FLEXERIL) 5 MG tablet TAKE 1 TABLET BY MOUTH THREE TIMES A DAY AS NEEDED FOR MUSCLE SPASMS Patient taking differently: Take 5 mg by mouth 3 (three) times daily as needed for muscle spasms.  04/03/20  Yes Sindy Guadeloupe, MD  ezetimibe (ZETIA) 10 MG tablet TAKE 1 TABLET BY MOUTH EVERY DAY Patient taking differently: Take by mouth daily.  03/20/20  Yes Troy Sine, MD  Fluticasone-Umeclidin-Vilant (TRELEGY ELLIPTA) 100-62.5-25 MCG/INH AEPB Inhale 1 puff into the lungs daily.   Yes [provider]  INCRUSE ELLIPTA 62.5  MCG/INH AEPB Inhale 1 puff into the lungs daily.  12/09/16  Yes [provider]  Ipratropium-Albuterol (COMBIVENT RESPIMAT) 20-100 MCG/ACT AERS respimat Inhale 2 puffs into the lungs every 6 (six) hours.    Yes [provider]  losartan (COZAAR) 50 MG tablet Take 1 tablet (50 mg total) by mouth daily. 12/15/19 12/14/20 Yes Swayze, Ava, DO  metFORMIN (GLUCOPHAGE) 500 MG tablet TAKE 1 TABLET (500 MG TOTAL) BY MOUTH 2 (TWO) TIMES DAILY WITH A MEAL. Patient taking differently: Take 500 mg by mouth daily with breakfast.  09/06/19  Yes Troy Sine, MD  oxyCODONE ER [facility] ER) 18 MG C12A Take 1 tablet by mouth every 12 (twelve) hours. 03/30/20  Yes Sindy Guadeloupe, MD  Oxycodone HCl 10 MG TABS Take 1 tablet (10 mg total) by mouth every 4 (four) hours as needed. 03/30/20  Yes Sindy Guadeloupe, MD  ranolazine (RANEXA) 1000 MG SR tablet TAKE 1 TABLET BY MOUTH TWICE A DAY Patient taking differently: Take 1,000 mg by mouth 2 (two) times daily.  06/11/19  Yes Troy Sine, MD    Physical Exam: Vitals:   04/18/20 0934 04/18/20 1022 04/18/20 1057 04/18/20 1200  BP: (!) 104/57  102/65 97/60 105/66  Pulse: 75 76 81 77  Resp: 18 19 19 16   Temp: 98 F (36.7 C)     SpO2: 100% 100% 100% 100%  Weight:      Height:       General: Not in acute distress HEENT:       Eyes: PERRL, EOMI, no scleral icterus.       ENT: No discharge from the ears and nose, no pharynx injection, no tonsillar enlargement.        Neck: No JVD, no bruit, no mass felt. Heme: No neck lymph node enlargement. Cardiac: S1/S2, RRR, No murmurs, No gallops or rubs. Respiratory: Has rhonchi bilaterally GI: Soft, nondistended, has tenderness in central abdomen, no organomegaly, BS present. GU: No hematuria Ext:  1+ pitting leg edema bilaterally. 2+DP/PT pulse bilaterally. Musculoskeletal: No joint deformities, No joint redness or warmth, no limitation of ROM in spin. Skin: No rashes.  Neuro: confused, could recognize his wife and knows that he is in hospital.  He is not oriented to time. cranial nerves II-XII grossly intact, moves all extremities  Psych: Patient is not psychotic, no suicidal or hemocidal ideation.  Labs on Admission: I have personally reviewed following labs and imaging studies  CBC: Recent Labs  Lab 04/18/20 0804  WBC 13.6*  NEUTROABS 10.6*  HGB 9.7*  HCT 29.4*  MCV 93.9  PLT 182   Basic Metabolic Panel: Recent Labs  Lab 04/18/20 0804  NA 134*  K 4.7  CL 98  CO2 25  GLUCOSE 92  BUN 33*  CREATININE 2.45*  CALCIUM 8.4*   GFR: Estimated Creatinine Clearance: 26.8 mL/min (A) (by C-G formula based on SCr of 2.45 mg/dL (H)). Liver Function Tests: Recent Labs  Lab 04/18/20 0804  AST 56*  ALT 17  ALKPHOS 85  BILITOT 1.1  PROT 6.7  ALBUMIN 3.2*   Recent Labs  Lab 04/18/20 0804  LIPASE 16   No results for input(s): AMMONIA in the last 168 hours. Coagulation Profile: Recent Labs  Lab 04/18/20 0804  INR 1.2   Cardiac Enzymes: No results for input(s): CKTOTAL, CKMB, CKMBINDEX, TROPONINI in the last 168 hours. BNP (last 3 results) No results for  input(s): PROBNP in the last 8760 hours. HbA1C: No results for input(s): HGBA1C  in the last 72 hours. CBG: No results for input(s): GLUCAP in the last 168 hours. Lipid Profile: No results for input(s): CHOL, HDL, LDLCALC, TRIG, CHOLHDL, LDLDIRECT in the last 72 hours. Thyroid Function Tests: No results for input(s): TSH, T4TOTAL, FREET4, T3FREE, THYROIDAB in the last 72 hours. Anemia Panel: No results for input(s): VITAMINB12, FOLATE, FERRITIN, TIBC, IRON, RETICCTPCT in the last 72 hours. Urine analysis:    Component Value Date/Time   COLORURINE YELLOW (A) 12/03/2019 1123   APPEARANCEUR CLEAR (A) 12/03/2019 1123   APPEARANCEUR Clear 10/26/2019 1511   LABSPEC 1.008 12/03/2019 1123   PHURINE 7.0 12/03/2019 1123   GLUCOSEU NEGATIVE 12/03/2019 1123   HGBUR NEGATIVE 12/03/2019 [place] 12/03/2019 1123   BILIRUBINUR Negative 10/26/2019 1511   KETONESUR NEGATIVE 12/03/2019 1123   PROTEINUR NEGATIVE 12/03/2019 1123   UROBILINOGEN 1.0 09/04/2009 0122   NITRITE NEGATIVE 12/03/2019 1123   LEUKOCYTESUR TRACE (A) 12/03/2019 1123   Sepsis Labs: @LABRCNTIP (procalcitonin:4,lacticidven:4) ) Recent Results (from the past 240 hour(s))  SARS Coronavirus 2 by RT PCR (hospital order, performed in [place] hospital lab) Nasopharyngeal Nasopharyngeal Swab     Status: None   Collection Time: 04/18/20  8:05 AM   Specimen: Nasopharyngeal Swab  Result Value Ref Range Status   SARS Coronavirus 2 NEGATIVE NEGATIVE Final    Comment: (NOTE) SARS-CoV-2 target nucleic acids are NOT DETECTED.  The SARS-CoV-2 RNA is generally detectable in upper and lower respiratory specimens during the acute phase of infection. The lowest concentration of SARS-CoV-2 viral copies this assay can detect is 250 copies / mL. A negative result does not preclude SARS-CoV-2 infection and should not be used as the sole basis for treatment or other patient management decisions.  A negative result may occur  with improper specimen collection / handling, submission of specimen other than nasopharyngeal swab, presence of viral mutation(s) within the areas targeted by this assay, and inadequate number of viral copies (<250 copies / mL). A negative result must be combined with clinical observations, patient history, and epidemiological information.  Fact Sheet for Patients:   StrictlyIdeas.no  Fact Sheet for Healthcare Providers: BankingDealers.co.za  This test is not yet approved or  cleared by the [place] FDA and has been authorized for detection and/or diagnosis of SARS-CoV-2 by FDA under an Emergency Use Authorization (EUA).  This EUA will remain in effect (meaning this test can be used) for the duration of the COVID-19 declaration under Section 564(b)(1) of the Act, 21 U.S.C. section 360bbb-3(b)(1), unless the authorization is terminated or revoked sooner.  Performed at [facility], [address] [place], [place] [address]      Radiological Exams on Admission: CT ABDOMEN PELVIS WO CONTRAST  Result Date: 04/18/2020 CLINICAL DATA:  Shortness of breath, altered mental status and not eating or drinking. 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 EXAM: CT CHEST, ABDOMEN AND PELVIS WITHOUT CONTRAST TECHNIQUE: Multidetector CT imaging of the chest, abdomen and pelvis was performed following the standard protocol without IV contrast. COMPARISON:  CT scan 03/03/2020 FINDINGS: CT CHEST FINDINGS Cardiovascular: The heart is normal in size. No pericardial effusion. Stable tortuosity and extensive calcification of the thoracic aorta and coronary arteries. Surgical changes from coronary artery bypass surgery. Mediastinum/Nodes: Stable scattered sub 8 mm mediastinal and hilar lymph nodes. No supraclavicular or axillary adenopathy. Lungs/Pleura: Stable advanced emphysematous changes and pulmonary scarring. Progressive  pulmonary metastatic disease. Stable radiation changes in the right upper lobe laterally. No obvious recurrent tumor in  this location. Right lower lobe airspace consolidation with air bronchograms suggesting pneumonia. The left lung is clear of an acute process. Musculoskeletal: New lytic bone lesion involving the right scapula is noted. There is also a progressive destructive lytic bone lesion involving the left scapula. No obvious sternal or thoracic vertebral body lesions. No obvious rib lesions. CT ABDOMEN PELVIS FINDINGS Hepatobiliary: No obvious hepatic lesions are identified without contrast. Layering high attenuation material in the gallbladder is likely sludge. No common bile duct dilatation. Pancreas: No pancreatic mass, inflammation or ductal dilatation. Spleen: Normal size.  No focal lesions. Adrenals/Urinary Tract: Enlarging bilateral adrenal gland metastasis. Status post right nephrectomy. Stable left renal cysts. The bladder is unremarkable. Stomach/Bowel: The stomach, duodenum, small bowel and colon are grossly normal without contrast. Vascular/Lymphatic: Stable advanced vascular calcifications. No mesenteric or retroperitoneal mass or adenopathy. Reproductive: The prostate gland and seminal vesicles are unremarkable Other: No pelvic mass or adenopathy. No free pelvic fluid collections. No inguinal mass or adenopathy. No abdominal wall hernia or subcutaneous lesions. Musculoskeletal: Progressive destructive bone lesion involving the left ischium. Progressive destructive lesion involving the left ilium anteriorly. Slight progression of L3 lytic metastasis. IMPRESSION: 1. Progressive pulmonary metastatic disease. 2. Right lower lobe airspace consolidation with air bronchograms suggesting pneumonia. 3. Enlarging bilateral adrenal gland metastasis. 4. Progressive osseous metastatic disease. 5. Stable radiation changes in the right upper lobe laterally. No obvious recurrent tumor in this location. Aortic  Atherosclerosis (ICD10-I70.0) and Emphysema (ICD10-J43.9). Electronically Signed   By: Marijo Sanes M.D.   On: 04/18/2020 10:30   CT Chest Wo Contrast  Result Date: 04/18/2020 CLINICAL DATA:  Shortness of breath, altered mental status and not eating or drinking. 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 EXAM: CT CHEST, ABDOMEN AND PELVIS WITHOUT CONTRAST TECHNIQUE: Multidetector CT imaging of the chest, abdomen and pelvis was performed following the standard protocol without IV contrast. COMPARISON:  CT scan 03/03/2020 FINDINGS: CT CHEST FINDINGS Cardiovascular: The heart is normal in size. No pericardial effusion. Stable tortuosity and extensive calcification of the thoracic aorta and coronary arteries. Surgical changes from coronary artery bypass surgery. Mediastinum/Nodes: Stable scattered sub 8 mm mediastinal and hilar lymph nodes. No supraclavicular or axillary adenopathy. Lungs/Pleura: Stable advanced emphysematous changes and pulmonary scarring. Progressive pulmonary metastatic disease. Stable radiation changes in the right upper lobe laterally. No obvious recurrent tumor in this location. Right lower lobe airspace consolidation with air bronchograms suggesting pneumonia. The left lung is clear of an acute process. Musculoskeletal: New lytic bone lesion involving the right scapula is noted. There is also a progressive destructive lytic bone lesion involving the left scapula. No obvious sternal or thoracic vertebral body lesions. No obvious rib lesions. CT ABDOMEN PELVIS FINDINGS Hepatobiliary: No obvious hepatic lesions are identified without contrast. Layering high attenuation material in the gallbladder is likely sludge. No common bile duct dilatation. Pancreas: No pancreatic mass, inflammation or ductal dilatation. Spleen: Normal size.  No focal lesions. Adrenals/Urinary Tract: Enlarging bilateral adrenal gland metastasis. Status post right nephrectomy. Stable left renal cysts. The  bladder is unremarkable. Stomach/Bowel: The stomach, duodenum, small bowel and colon are grossly normal without contrast. Vascular/Lymphatic: Stable advanced vascular calcifications. No mesenteric or retroperitoneal mass or adenopathy. Reproductive: The prostate gland and seminal vesicles are unremarkable Other: No pelvic mass or adenopathy. No free pelvic fluid collections. No inguinal mass or adenopathy. No abdominal wall hernia or subcutaneous lesions. Musculoskeletal: Progressive destructive bone lesion involving the left ischium. Progressive destructive lesion involving the left ilium anteriorly. Slight  progression of L3 lytic metastasis. IMPRESSION: 1. Progressive pulmonary metastatic disease. 2. Right lower lobe airspace consolidation with air bronchograms suggesting pneumonia. 3. Enlarging bilateral adrenal gland metastasis. 4. Progressive osseous metastatic disease. 5. Stable radiation changes in the right upper lobe laterally. No obvious recurrent tumor in this location. Aortic Atherosclerosis (ICD10-I70.0) and Emphysema (ICD10-J43.9). Electronically Signed   By: Marijo Sanes M.D.   On: 04/18/2020 10:30   US Venous Img Lower Unilateral Right  Result Date: 04/18/2020 CLINICAL DATA:  69 year old male with a history swelling EXAM: RIGHT LOWER EXTREMITY VENOUS DOPPLER ULTRASOUND TECHNIQUE: Gray-scale sonography with graded compression, as well as color Doppler and duplex ultrasound were performed to evaluate the lower extremity deep venous systems from the level of the common femoral vein and including the common femoral, femoral, profunda femoral, popliteal and calf veins including the posterior tibial, peroneal and gastrocnemius veins when visible. The superficial great saphenous vein was also interrogated. Spectral Doppler was utilized to evaluate flow at rest and with distal augmentation maneuvers in the common femoral, femoral and popliteal veins. COMPARISON:  None. FINDINGS: Contralateral Common  Femoral Vein: Respiratory phasicity is normal and symmetric with the symptomatic side. No evidence of thrombus. Normal compressibility. Common Femoral Vein: No evidence of thrombus. Normal compressibility, respiratory phasicity and response to augmentation. Saphenofemoral Junction: No evidence of thrombus. Normal compressibility and flow on color Doppler imaging. Profunda Femoral Vein: No evidence of thrombus. Normal compressibility and flow on color Doppler imaging. Femoral Vein: No evidence of thrombus. Normal compressibility, respiratory phasicity and response to augmentation. Popliteal Vein: No evidence of thrombus. Normal compressibility, respiratory phasicity and response to augmentation. Calf Veins: No evidence of thrombus. Normal compressibility and flow on color Doppler imaging. Superficial Great Saphenous Vein: No evidence of thrombus. Normal compressibility and flow on color Doppler imaging. Other Findings:  None. IMPRESSION: Sonographic survey of the right lower extremity negative for DVT Electronically Signed   By: Corrie Mckusick D.O.   On: 04/18/2020 09:55   DG Chest Port 1 View  Result Date: 04/18/2020 CLINICAL DATA:  ALTERED MENTAL STATUS EXAM: PORTABLE CHEST 1 VIEW COMPARISON:  12/14/2019; 06/25/2019; chest CT-03/03/2020 FINDINGS: Grossly unchanged enlarged cardiac silhouette and mediastinal contours post median sternotomy and CABG. Slight retraction of a left jugular approach port a catheter with tip now projected over the central aspect of the left innominate vein. The lungs remain hyperexpanded. Ill-defined nodular interstitial opacities involving the peripheral aspect of the right mid lung as well as the right lung base are unchanged. No new discrete focal airspace opacities. Persistent blunting of the right costophrenic angle without definite pleural effusion. Note is made of a crescentic lucency below the left hemidiaphragm. No acute osseous abnormalities. IMPRESSION: 1. Similar findings of  cardiomegaly and known right greater than left pulmonary metastatic disease. 2. Crescentic lucency below the left hemidiaphragm may represent air within the stomach, however pneumoperitoneum could have a similar appearance. Correlation for abdominal symptoms is advised. Further evaluation with decubital abdominal radiographic series could be performed as indicated. 3. Continued retraction of left jugular approach port a catheter with tip now projected over the central aspect the left innominate vein. Critical Value/emergent results were called by telephone at the time of interpretation on 04/18/2020 at 8:23 am to provider PHILLIP STAFFORD , who verbally acknowledged these results. Electronically Signed   By: Sandi Mariscal M.D.   On: 04/18/2020 08:22     EKG: Not done in ED, will get one.   Assessment/Plan Principal Problem:   HCAP (healthcare-associated pneumonia)  Active Problems:   HTN (hypertension)   Smoking   Malignant neoplasm of upper lobe of right lung (HCC)   Acute on chronic respiratory failure with hypoxia and hypercapnia (HCC)   HLD (hyperlipidemia)   COPD (chronic obstructive pulmonary disease) (HCC)   Sepsis (HCC)   Depression with anxiety   CAD (coronary artery disease)   Chronic diastolic CHF (congestive heart failure) (HCC)   Acute renal failure superimposed on stage 3a chronic kidney disease (HCC)   Normocytic anemia   Acute metabolic encephalopathy   Sepsis and acute on chronic respiratory failure with hypoxia and hypercapnia due to HCAP (healthcare-associated pneumonia): Patient meets criteria for sepsis with hypotension and leukocytosis.  Lactic acid is normal.  Blood pressure responded to IV fluid resuscitation, stabilized, currently blood pressure 97/60.  Hemodynamically stable.  -Admitted to progressive unit as inpatient - IV Vancomycin and cefepime were given in ED. Will switch cefepime to Zosyn to cover possible aspiration - Mucinex for cough  - Bronchodilators -  Urine legionella and S. pneumococcal antigen - Follow up blood culture x2, sputum culture - will get Procalcitonin -->0.14 - IVF: 2 .5L of NS bolus in ED (patient has diastolic congestive heart failure, limiting aggressive IV fluids treatment)  HTN (hypertension) -hold all blood pressure medications due to hypotension (Zocor, amlodipine, Zebeta)  Smoking -Nicotine patch  Malignant neoplasm of upper lobe of right lung ([place]): Metastasized to multiple location.  Patient had a chemotherapy and is doing radiation therapy currently. 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 -Continue aspirin, Zetia, Lipitor and Ranexa  Chronic diastolic CHF (congestive heart failure) ([place]): 2D echo 11/08/2019 showed EF of 50% with grade 1 diastolic dysfunction.  Patient has 1+ leg edema.  No JVD.  Does not seem to have pulmonary edema chest x-ray.  CHF does not seem to be exacerbated, he is at risk of having CHF exacerbation. -Check BNP -Will not give diuretics due to sepsis and hypotension  Acute renal failure superimposed on stage 3a chronic kidney disease ([place]): Baseline creatinine 1.0-1.2, recent creatinine 1.82 on 03/03/2020.  His creatinine is 2.45, BUN 33.  Likely due to multifactorial etiology, including decreased oral intake, possible ATN secondary to hypotension and continuation of Cozaar -Cozaar is on hold -IV fluid as above -Follow-up renal function by BMP  Normocytic anemia: Hgb 11.8 on 03/03/20 -->9.7. No active bleeding -Follow-up of CBC   Acute metabolic encephalopathy: Likely multifactorial etiology as listed above -Frequent neuro checks     DVT ppx: SQ Lovenox Code Status: DNR (I discussed with patient's wife, and explained the  meaning of CODE STATUS. Wife states that patient is DNR) Family Communication:   Yes, patient's wife at bed side Disposition Plan:  Anticipate discharge back to previous environment Consults called:  none Admission status: progressive unit as inpt     Status is: Inpatient  Remains inpatient appropriate because:Inpatient level of care appropriate due to severity of illness.  Patient has multiple comorbidities, now presents with sepsis and acute on chronic respiratory failure with hypoxia due to HCAP.  Patient also has worsening renal function and altered mental status.  His presentation is highly complicated.  Given his multiple severe comorbidities, patient is at high risk of deteriorating.  Patient will need to be treated in hospital for at least 2  days.   Dispo: The patient is from: Home              Anticipated d/c is to: Home              Anticipated d/c date is: 2 days              Patient currently is not medically stable to d/c.           Date of Service 04/18/2020    Ivor [facility]   If 7PM-7AM, please contact night-coverage [email or web address] 04/18/2020, 12:25 PM

## 2020-04-18 NOTE — Progress Notes (Signed)
PHARMACY -  BRIEF ANTIBIOTIC NOTE   Pharmacy has received consult(s) for cefepime and vancomycin from an ED provider.  The patient's profile has been reviewed for ht/wt/allergies/indication/available labs.    One time order(s) placed for cefepime 2g IV x1 and vancomycin 1 g IV x1 by EDP  Will order another dose of vancomycin 500 mg IV x1 to complete total loading dose of 1500 mg  Further antibiotics/pharmacy consults should be ordered by admitting physician if indicated.                       Thank you, Rocky Morel 04/18/2020  8:31 AM

## 2020-04-19 ENCOUNTER — Ambulatory Visit: Payer: Medicare Other

## 2020-04-19 ENCOUNTER — Encounter: Payer: Self-pay | Admitting: Internal Medicine

## 2020-04-19 DIAGNOSIS — Z515 Encounter for palliative care: Secondary | ICD-10-CM

## 2020-04-19 LAB — CBC
HCT: 30.3 % — ABNORMAL LOW (ref 39.0–52.0)
Hemoglobin: 9.6 g/dL — ABNORMAL LOW (ref 13.0–17.0)
MCH: 29.8 pg (ref 26.0–34.0)
MCHC: 31.7 g/dL (ref 30.0–36.0)
MCV: 94.1 fL (ref 80.0–100.0)
Platelets: 185 10*3/uL (ref 150–400)
RBC: 3.22 MIL/uL — ABNORMAL LOW (ref 4.22–5.81)
RDW: 14.7 % (ref 11.5–15.5)
WBC: 12.8 10*3/uL — ABNORMAL HIGH (ref 4.0–10.5)
nRBC: 0 % (ref 0.0–0.2)

## 2020-04-19 LAB — BASIC METABOLIC PANEL
Anion gap: 9 (ref 5–15)
BUN: 37 mg/dL — ABNORMAL HIGH (ref 8–23)
CO2: 24 mmol/L (ref 22–32)
Calcium: 8.6 mg/dL — ABNORMAL LOW (ref 8.9–10.3)
Chloride: 102 mmol/L (ref 98–111)
Creatinine, Ser: 2.03 mg/dL — ABNORMAL HIGH (ref 0.61–1.24)
GFR calc Af Amer: 37 mL/min — ABNORMAL LOW (ref >60)
GFR calc non Af Amer: 32 mL/min — ABNORMAL LOW (ref >60)
Glucose, Bld: 129 mg/dL — ABNORMAL HIGH (ref 70–99)
Potassium: 4.9 mmol/L (ref 3.5–5.1)
Sodium: 135 mmol/L (ref 135–145)

## 2020-04-19 LAB — BLOOD GAS, VENOUS
Acid-base deficit: 2 mmol/L (ref 0.0–2.0)
Bicarbonate: 26.2 mmol/L (ref 20.0–28.0)
O2 Saturation: 43.3 %
Patient temperature: 37
pCO2, Ven: 64 mmHg — ABNORMAL HIGH (ref 44.0–60.0)
pH, Ven: 7.22 — ABNORMAL LOW (ref 7.250–7.430)

## 2020-04-19 LAB — GLUCOSE, CAPILLARY
Glucose-Capillary: 128 mg/dL — ABNORMAL HIGH (ref 70–99)
Glucose-Capillary: 222 mg/dL — ABNORMAL HIGH (ref 70–99)
Glucose-Capillary: 166 mg/dL — ABNORMAL HIGH (ref 70–99)

## 2020-04-19 LAB — PROCALCITONIN: Procalcitonin: 0.12 ng/mL

## 2020-04-19 MED ORDER — SODIUM CHLORIDE 0.9 % IV SOLN
INTRAVENOUS | Status: DC | PRN
  Administered 2020-04-19 (×2): 250 mL via INTRAVENOUS

## 2020-04-19 MED ORDER — UMECLIDINIUM BROMIDE 62.5 MCG/INH IN AEPB
1.0000 | INHALATION_SPRAY | Freq: Every day | RESPIRATORY_TRACT | Status: DC
  Administered 2020-04-19 – 2020-04-20 (×2): 1 via RESPIRATORY_TRACT
  Filled 2020-04-19: qty 7

## 2020-04-19 MED ORDER — ENOXAPARIN SODIUM 40 MG/0.4ML ~~LOC~~ SOLN
40.0000 mg | SUBCUTANEOUS | Status: DC
  Administered 2020-04-19 – 2020-04-20 (×2): 40 mg via SUBCUTANEOUS
  Filled 2020-04-19 (×2): qty 0.4

## 2020-04-19 MED ORDER — ACETAMINOPHEN 325 MG PO TABS: 650.0000 mg | ORAL_TABLET | Freq: Four times a day (QID) | ORAL | Status: DC | PRN

## 2020-04-19 MED ORDER — FLUTICASONE FUROATE-VILANTEROL 100-25 MCG/INH IN AEPB
1.0000 | INHALATION_SPRAY | Freq: Every day | RESPIRATORY_TRACT | Status: DC
  Administered 2020-04-19 – 2020-04-20 (×2): 1 via RESPIRATORY_TRACT
  Filled 2020-04-19: qty 28

## 2020-04-19 MED ORDER — IPRATROPIUM-ALBUTEROL 0.5-2.5 (3) MG/3ML IN SOLN
3.0000 mL | Freq: Four times a day (QID) | RESPIRATORY_TRACT | Status: DC
  Administered 2020-04-19 – 2020-04-21 (×8): 3 mL via RESPIRATORY_TRACT
  Filled 2020-04-19 (×9): qty 3

## 2020-04-19 NOTE — Progress Notes (Signed)
PROGRESS NOTE    Patient: Troy [name]                            PCP: [name], MD                    DOB: [birth year]            DOA: 04/18/2020 GMW:[id number]             DOS: 04/19/2020, 1:43 PM   LOS: 1 day   Date of Service: The patient was seen and examined on 04/19/2020  Subjective:   The patient was seen and examined this Am. Was awake alert cooperative, vitals were stable   Brief Narrative:  Per HPI  Troy [name] is a 69 y.o. 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 (HCC)   Acute on chronic respiratory failure with hypoxia and hypercapnia (HCC)   HLD (hyperlipidemia)   COPD (chronic obstructive pulmonary disease) (HCC)   Sepsis (HCC)   Depression with anxiety   CAD (coronary artery disease)   Chronic diastolic CHF (congestive heart failure) (HCC)   Acute renal failure superimposed on stage 3a chronic kidney disease (HCC)   Normocytic anemia   Acute metabolic encephalopathy   Palliative care encounter   Sepsis and acute on chronic respiratory failure with hypoxia and hypercapnia due to HCAP (healthcare-associated pneumonia): -Patient met the criteria for sepsis due to hypotension, leukocytosis.  Normal lactic acid, -Hypotension responded well to IV fluid resuscitation -Currently  hemodynamically stable  - IV Vancomycin and cefepime were given in ED. Will switch cefepime to Zosyn to cover possible aspiration - Mucinex for cough  - Bronchodilators - Urine legionella and S. 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 -Palliative/hospice was consulted -Follow-up with oncology   HLD (hyperlipidemia) -Lipitor, Zetia  COPD (chronic obstructive pulmonary disease) (HCC) -Bronchodilators  Depression with anxiety -Continue home medications  CAD (coronary artery disease): S/p of CABG, No chest pain. -Continue aspirin, Zetia, Lipitor and Ranexa  Chronic diastolic CHF (congestive heart failure) ([place]):  2D echo 11/08/2019 showed EF of 50% with grade 1 diastolic dysfunction.  Patient has 1+ leg edema.  No JVD.  Does not seem to  have pulmonary edema chest x-ray.  CHF does not seem to be exacerbated, he is at risk of having CHF exacerbation. -Check BNP -Will not give diuretics due to sepsis and hypotension  Acute renal failure superimposed on stage 3a chronic kidney disease (HCC):  Baseline creatinine 1.0-1.2, recent creatinine 1.82 on 03/03/2020.   His creatinine is 2.45, BUN 33.  Likely due to multifactorial etiology, including decreased oral intake, possible ATN secondary to hypotension and continuation of Cozaar -Cozaar is on hold -IV fluid as above -Follow-up renal function by BMP  Normocytic anemia: Hgb 11.8 on 03/03/20 -->9.7. No active bleeding -Follow-up of CBC   Acute metabolic encephalopathy: Likely multifactorial etiology as listed above -Frequent neuro checks     DVT ppx: SQ Lovenox Code Status: DNR (I discussed with patient's wife, and explained the meaning of CODE STATUS. Wife states that patient is DNR) Hospice consulted, patient is accepted hospice at home. Family Communication:   Yes, patient's wife at bed side Disposition Plan:  Anticipate discharge back to previous environment Consults called:  none Admission status: progressive unit as inpt     Status is: Inpatient   ----------------------------------------------------------------------------------------------------------------------------------------------     Procedures:   No admission procedures for hospital encounter.     Antimicrobials:  Anti-infectives (From admission, onward)   Start     Dose/Rate Route Frequency Ordered Stop   04/18/20 1400  piperacillin-tazobactam (ZOSYN) IVPB 3.375 g     Discontinue     3.375 g 12.5 mL/hr over 240 Minutes Intravenous Every 8 hours 04/18/20 1308     04/18/20 0930  vancomycin (VANCOREADY) IVPB 500 mg/100 mL        500 mg 100 mL/hr over 60 Minutes Intravenous  Once 04/18/20 0833 04/18/20 1057   04/18/20 0800  vancomycin (VANCOCIN) IVPB 1000 mg/200 mL premix        1,000 mg 200 mL/hr over 60 Minutes Intravenous  Once 04/18/20 0756 04/18/20 0934   04/18/20 0800  ceFEPIme (MAXIPIME) 2 g in sodium chloride 0.9 % 100 mL IVPB        2 g 200 mL/hr over 30 Minutes Intravenous  Once 04/18/20 0756 04/18/20 0934       Medication:  . ALPRAZolam  0.25 mg Oral TID  . aspirin EC  81 mg Oral Daily  .  atorvastatin  40 mg Oral QHS  . busPIRone  7.5 mg Oral BID  . dextromethorphan-guaiFENesin  1 tablet Oral BID  . enoxaparin (LOVENOX) injection  40 mg Subcutaneous Q24H  . ezetimibe  10 mg Oral Daily  . fluticasone furoate-vilanterol  1 puff Inhalation Daily   And  . umeclidinium bromide  1 puff Inhalation Daily  . insulin aspart  0-5 Units Subcutaneous QHS  . insulin aspart  0-9 Units Subcutaneous TID WC  . ipratropium-albuterol  3 mL Nebulization Q6H  . nicotine  21 mg Transdermal Daily  . ranolazine  1,000 mg Oral BID    sodium chloride, acetaminophen, albuterol, cyclobenzaprine, ondansetron (ZOFRAN) IV, oxyCODONE   Objective:   Vitals:   04/19/20 0421 04/19/20 0500 04/19/20 0719 04/19/20 0725  BP:   (!) 106/56   Pulse:   76 75  Resp:    18  Temp:   98 F (36.7 C)   TempSrc:   Oral   SpO2: 100% 100% 100% 100%  Weight:      Height:        Intake/Output Summary (Last 24 hours) at 04/19/2020 1343 Last data filed at 04/19/2020 0534 Gross per 24  hour  Intake --  Output 0 ml  Net 0 ml   Filed Weights   04/18/20 0754 04/19/20 0226  Weight: 76.6 kg 76.5 kg     Examination:   Physical Exam  Constitution:  Alert, cooperative, no distress,  Appears calm and comfortable  Psychiatric:stable mood HEENT: Normocephalic, PERRL, otherwise with in Normal limits  Chest:Chest symmetric Cardio vascular:  S1/S2, RRR, No murmure, No Rubs or Gallops  pulmonary: Clear to auscultation bilaterally, respirations unlabored, negative wheezes / crackles Abdomen: Soft, non-tender, non-distended, bowel sounds,no masses, no organomegaly Muscular skeletal: Limited exam - in bed, able to move all 4 extremities, Normal strength,  Neuro: CNII-XII intact. , normal motor and sensation, reflexes intact  Extremities: No pitting edema lower extremities, +2 pulses  Skin: Dry, warm to touch, negative for any Rashes, No open wounds Wounds: per nursing documentation     ------------------------------------------------------------------------------------------------------------------------------------------    LABs:  CBC Latest Ref Rng & Units 04/19/2020 04/18/2020 03/03/2020  WBC 4.0 - 10.5 K/uL 12.8(H) 13.6(H) 23.3(H)  Hemoglobin 13.0 - 17.0 g/dL 9.6(L) 9.7(L) 11.8(L)  Hematocrit 39 - 52 % 30.3(L) 29.4(L) 35.8(L)  Platelets 150 - 400 K/uL 185 220 297   CMP Latest Ref Rng & Units 04/19/2020 04/18/2020 03/03/2020  Glucose 70 - 99 mg/dL 129(H) 92 154(H)  BUN 8 - 23 mg/dL 37(H) 33(H) 22  Creatinine 0.61 - 1.24 mg/dL 2.03(H) 2.45(H) 1.82(H)  Sodium 135 - 145 mmol/L 135 134(L) 137  Potassium 3.5 - 5.1 mmol/L 4.9 4.7 3.9  Chloride 98 - 111 mmol/L 102 98 100  CO2 22 - 32 mmol/L '24 25 26  '$ Calcium 8.9 - 10.3 mg/dL 8.6(L) 8.4(L) 9.4  Total Protein 6.5 - 8.1 g/dL - 6.7 7.7  Total Bilirubin 0.3 - 1.2 mg/dL - 1.1 0.7  Alkaline Phos 38 - 126 U/L - 85 108  AST 15 - 41 U/L - 56(H) 16  ALT 0 - 44 U/L - 17 13       Micro Results Recent Results (from the past 240 hour(s))  Blood Culture (routine x 2)     Status: None (Preliminary result)   Collection Time: 04/18/20  8:04 AM   Specimen: BLOOD  Result Value Ref Range Status   Specimen Description BLOOD BLOOD RIGHT ARM  Final   Special Requests   Final    BOTTLES DRAWN AEROBIC AND ANAEROBIC Blood Culture adequate volume   Culture   Final    NO GROWTH 1 DAY Performed at [facility], [address] [place], [place] [address]    Report Status PENDING  Incomplete  Blood Culture (routine x 2)     Status: None (Preliminary result)   Collection Time: 04/18/20  8:05 AM   Specimen: BLOOD  Result Value Ref Range Status   Specimen Description BLOOD RIGHT ANTECUBITAL  Final   Special Requests   Final    BOTTLES DRAWN AEROBIC AND ANAEROBIC Blood Culture adequate volume   Culture   Final    NO GROWTH 1 DAY Performed at [facility], [address] [place], [place] [address]    Report Status  PENDING  Incomplete  SARS Coronavirus 2 by RT PCR (hospital order, performed in [place] hospital lab) Nasopharyngeal Nasopharyngeal Swab     Status: None   Collection Time: 04/18/20  8:05 AM   Specimen: Nasopharyngeal Swab  Result Value Ref Range Status   SARS Coronavirus 2 NEGATIVE NEGATIVE Final    Comment: (NOTE) SARS-CoV-2 target nucleic acids are NOT DETECTED.  The SARS-CoV-2 RNA is generally detectable in upper and lower respiratory specimens during the acute phase of infection. The lowest concentration of SARS-CoV-2 viral copies this assay can detect is 250 copies / mL. A negative result does not preclude SARS-CoV-2 infection and should not be used as the sole basis for treatment or other patient management decisions.  A negative result may occur with improper specimen collection / handling, submission of specimen other than nasopharyngeal swab, presence of viral mutation(s) within the areas targeted by this assay, and inadequate number of viral copies (<250 copies / mL). A negative result must be combined with clinical observations, patient history, and epidemiological information.  Fact Sheet for Patients:   StrictlyIdeas.no  Fact Sheet for Healthcare Providers: BankingDealers.co.za  This test is not yet approved or  cleared by the [place] FDA and has been authorized for detection and/or diagnosis of SARS-CoV-2 by FDA under an Emergency Use Authorization (EUA).  This EUA will remain in effect (meaning this test can be used) for the duration of the COVID-19 declaration under Section 564(b)(1) of the Act, 21 U.S.C. section 360bbb-3(b)(1), unless the authorization is terminated or revoked sooner.  Performed at [facility], [address] [place], [place] [address]   MRSA PCR Screening     Status: None   Collection Time: 04/18/20  3:45 PM   Specimen: Nasal Mucosa; Nasopharyngeal  Result Value Ref Range  Status   MRSA by PCR NEGATIVE NEGATIVE Final    Comment:        The GeneXpert MRSA Assay (FDA approved for NASAL specimens only), is one component of a comprehensive MRSA colonization surveillance program. It is not intended to diagnose MRSA infection nor to guide or monitor treatment for MRSA infections. Performed at [facility], [address] [place], [place] [address]     Radiology Reports CT ABDOMEN PELVIS WO CONTRAST  Result Date: 04/18/2020 CLINICAL DATA:  Shortness of breath, altered mental status and not eating or drinking. 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 EXAM: CT CHEST, ABDOMEN AND PELVIS WITHOUT CONTRAST TECHNIQUE: Multidetector CT imaging of the chest, abdomen and pelvis was performed following the standard protocol without IV contrast. COMPARISON:  CT scan 03/03/2020 FINDINGS: CT CHEST FINDINGS Cardiovascular: The heart is normal in size. No pericardial effusion. Stable tortuosity and extensive calcification of the thoracic aorta and coronary arteries. Surgical changes from coronary artery bypass surgery. Mediastinum/Nodes: Stable scattered sub 8 mm mediastinal and hilar lymph nodes. No supraclavicular or axillary adenopathy. Lungs/Pleura: Stable advanced emphysematous changes and pulmonary scarring. Progressive pulmonary metastatic disease. Stable radiation changes in the right upper lobe laterally. No obvious recurrent tumor in this location. Right lower lobe airspace consolidation with air bronchograms suggesting pneumonia. The left lung is clear of an acute process. Musculoskeletal: New lytic bone lesion involving the right scapula is noted. There is also a progressive destructive lytic bone lesion involving the left scapula. No obvious sternal or thoracic vertebral body lesions. No obvious rib lesions. CT ABDOMEN PELVIS FINDINGS Hepatobiliary: No obvious hepatic lesions are identified without contrast. Layering high attenuation material in  the gallbladder is likely sludge. No common bile duct dilatation. Pancreas: No pancreatic mass, inflammation or ductal dilatation. Spleen: Normal size.  No focal lesions. Adrenals/Urinary Tract: Enlarging bilateral adrenal gland metastasis. Status post right nephrectomy. Stable left renal cysts. The bladder is unremarkable. Stomach/Bowel: The stomach, duodenum, small bowel and colon are grossly normal without contrast. Vascular/Lymphatic: Stable advanced vascular calcifications. No mesenteric or retroperitoneal mass or  adenopathy. Reproductive: The prostate gland and seminal vesicles are unremarkable Other: No pelvic mass or adenopathy. No free pelvic fluid collections. No inguinal mass or adenopathy. No abdominal wall hernia or subcutaneous lesions. Musculoskeletal: Progressive destructive bone lesion involving the left ischium. Progressive destructive lesion involving the left ilium anteriorly. Slight progression of L3 lytic metastasis. IMPRESSION: 1. Progressive pulmonary metastatic disease. 2. Right lower lobe airspace consolidation with air bronchograms suggesting pneumonia. 3. Enlarging bilateral adrenal gland metastasis. 4. Progressive osseous metastatic disease. 5. Stable radiation changes in the right upper lobe laterally. No obvious recurrent tumor in this location. Aortic Atherosclerosis (ICD10-I70.0) and Emphysema (ICD10-J43.9). Electronically Signed   By: Marijo Sanes M.D.   On: 04/18/2020 10:30   CT Chest Wo Contrast  Result Date: 04/18/2020 CLINICAL DATA:  Shortness of breath, altered mental status and not eating or drinking. 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 EXAM: CT CHEST, ABDOMEN AND PELVIS WITHOUT CONTRAST TECHNIQUE: Multidetector CT imaging of the chest, abdomen and pelvis was performed following the standard protocol without IV contrast. COMPARISON:  CT scan 03/03/2020 FINDINGS: CT CHEST FINDINGS Cardiovascular: The heart is normal in size. No pericardial effusion.  Stable tortuosity and extensive calcification of the thoracic aorta and coronary arteries. Surgical changes from coronary artery bypass surgery. Mediastinum/Nodes: Stable scattered sub 8 mm mediastinal and hilar lymph nodes. No supraclavicular or axillary adenopathy. Lungs/Pleura: Stable advanced emphysematous changes and pulmonary scarring. Progressive pulmonary metastatic disease. Stable radiation changes in the right upper lobe laterally. No obvious recurrent tumor in this location. Right lower lobe airspace consolidation with air bronchograms suggesting pneumonia. The left lung is clear of an acute process. Musculoskeletal: New lytic bone lesion involving the right scapula is noted. There is also a progressive destructive lytic bone lesion involving the left scapula. No obvious sternal or thoracic vertebral body lesions. No obvious rib lesions. CT ABDOMEN PELVIS FINDINGS Hepatobiliary: No obvious hepatic lesions are identified without contrast. Layering high attenuation material in the gallbladder is likely sludge. No common bile duct dilatation. Pancreas: No pancreatic mass, inflammation or ductal dilatation. Spleen: Normal size.  No focal lesions. Adrenals/Urinary Tract: Enlarging bilateral adrenal gland metastasis. Status post right nephrectomy. Stable left renal cysts. The bladder is unremarkable. Stomach/Bowel: The stomach, duodenum, small bowel and colon are grossly normal without contrast. Vascular/Lymphatic: Stable advanced vascular calcifications. No mesenteric or retroperitoneal mass or adenopathy. Reproductive: The prostate gland and seminal vesicles are unremarkable Other: No pelvic mass or adenopathy. No free pelvic fluid collections. No inguinal mass or adenopathy. No abdominal wall hernia or subcutaneous lesions. Musculoskeletal: Progressive destructive bone lesion involving the left ischium. Progressive destructive lesion involving the left ilium anteriorly. Slight progression of L3 lytic  metastasis. IMPRESSION: 1. Progressive pulmonary metastatic disease. 2. Right lower lobe airspace consolidation with air bronchograms suggesting pneumonia. 3. Enlarging bilateral adrenal gland metastasis. 4. Progressive osseous metastatic disease. 5. Stable radiation changes in the right upper lobe laterally. No obvious recurrent tumor in this location. Aortic Atherosclerosis (ICD10-I70.0) and Emphysema (ICD10-J43.9). Electronically Signed   By: Marijo Sanes M.D.   On: 04/18/2020 10:30   US Venous Img Lower Unilateral Right  Result Date: 04/18/2020 CLINICAL DATA:  69 year old male with a history swelling EXAM: RIGHT LOWER EXTREMITY VENOUS DOPPLER ULTRASOUND TECHNIQUE: Gray-scale sonography with graded compression, as well as color Doppler and duplex ultrasound were performed to evaluate the lower extremity deep venous systems from the level of the common femoral vein and including the common femoral, femoral, profunda femoral, popliteal and calf veins including  the posterior tibial, peroneal and gastrocnemius veins when visible. The superficial great saphenous vein was also interrogated. Spectral Doppler was utilized to evaluate flow at rest and with distal augmentation maneuvers in the common femoral, femoral and popliteal veins. COMPARISON:  None. FINDINGS: Contralateral Common Femoral Vein: Respiratory phasicity is normal and symmetric with the symptomatic side. No evidence of thrombus. Normal compressibility. Common Femoral Vein: No evidence of thrombus. Normal compressibility, respiratory phasicity and response to augmentation. Saphenofemoral Junction: No evidence of thrombus. Normal compressibility and flow on color Doppler imaging. Profunda Femoral Vein: No evidence of thrombus. Normal compressibility and flow on color Doppler imaging. Femoral Vein: No evidence of thrombus. Normal compressibility, respiratory phasicity and response to augmentation. Popliteal Vein: No evidence of thrombus. Normal  compressibility, respiratory phasicity and response to augmentation. Calf Veins: No evidence of thrombus. Normal compressibility and flow on color Doppler imaging. Superficial Great Saphenous Vein: No evidence of thrombus. Normal compressibility and flow on color Doppler imaging. Other Findings:  None. IMPRESSION: Sonographic survey of the right lower extremity negative for DVT Electronically Signed   By: Corrie Mckusick D.O.   On: 04/18/2020 09:55   DG Chest Port 1 View  Result Date: 04/18/2020 CLINICAL DATA:  ALTERED MENTAL STATUS EXAM: PORTABLE CHEST 1 VIEW COMPARISON:  12/14/2019; 06/25/2019; chest CT-03/03/2020 FINDINGS: Grossly unchanged enlarged cardiac silhouette and mediastinal contours post median sternotomy and CABG. Slight retraction of a left jugular approach port a catheter with tip now projected over the central aspect of the left innominate vein. The lungs remain hyperexpanded. Ill-defined nodular interstitial opacities involving the peripheral aspect of the right mid lung as well as the right lung base are unchanged. No new discrete focal airspace opacities. Persistent blunting of the right costophrenic angle without definite pleural effusion. Note is made of a crescentic lucency below the left hemidiaphragm. No acute osseous abnormalities. IMPRESSION: 1. Similar findings of cardiomegaly and known right greater than left pulmonary metastatic disease. 2. Crescentic lucency below the left hemidiaphragm may represent air within the stomach, however pneumoperitoneum could have a similar appearance. Correlation for abdominal symptoms is advised. Further evaluation with decubital abdominal radiographic series could be performed as indicated. 3. Continued retraction of left jugular approach port a catheter with tip now projected over the central aspect the left innominate vein. Critical Value/emergent results were called by telephone at the time of interpretation on 04/18/2020 at 8:23 am to provider PHILLIP  STAFFORD , who verbally acknowledged these results. Electronically Signed   By: Sandi Mariscal M.D.   On: 04/18/2020 08:22    SIGNED: Deatra Grace, MD, FACP, FHM. [facility],  Pager (please use [email or web address] to page/[email or web address])  If 7PM-7AM, please contact night-coverage [email or web address] 04/19/2020, 1:43 PM

## 2020-04-19 NOTE — Consult Note (Signed)
[name]  Telephone:([phone] [phone]   Name: Troy [name] Date: 04/19/2020 MRN: [id number]  DOB: 12-04-1950  Patient Care Team: [name], MD as PCP - General (Internal Medicine) Troy Sine, MD as PCP - Cardiology (Cardiology)    REASON FOR CONSULTATION: Troy [name] is a 69 y.o. 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  As part of his staging work-up for cancer he was found to have right upper lobe nodule.  He ultimately underwent PET scan and was found to have hypermetabolic lesions in the lung, left adrenal, and right kidney.  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  Patient is status post concurrent chemoradiation.  Patient was admitted to the hospital on 04/18/2020 for sepsis likely from pneumonia.  Repeat CT of the chest, abdomen, and pelvis revealed progressive disease in the lung, skeleton, and adrenals.  He is felt to likely be a poor candidate for further systemic treatment.  Palliative care was consulted to help address goals.  SOCIAL HISTORY:     reports that he has been smoking cigarettes. He has a 100.00 pack-year smoking history. He quit smokeless tobacco use about 7 years ago.  His smokeless tobacco use included chew. He reports previous drug use. He reports that he does not drink alcohol.   Patient is married but separated from his wife.  He lives with his son and grandson.  Patient also has a daughter who lives nearby.  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  ADVANCE DIRECTIVES:  Does not have  CODE STATUS: DNR  PAST MEDICAL HISTORY: Past Medical History:  Diagnosis Date  . Anxiety   . COPD (chronic obstructive pulmonary disease) ([place])   . Coronary artery disease 2006   Acute  coronary syndrome in March 2006.   . Diabetes mellitus without complication ([place])   . Dyslipidemia   . Hypertension 10/09/2010   EF 40-45%  . Malignant neoplasm of upper lobe of right lung ([facility])    Chemo + rad tx's.   . 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 Myocardial infarction ([place])   . Renal cancer, right ([place]) 11/2019   Nephrectomy.  . Sleep apnea    uses cpap machine    PAST SURGICAL HISTORY:  Past Surgical History:  Procedure Laterality Date  . Arterial evalation      no evidence of ICA stenosis  . CARDIAC CATHETERIZATION    . CARDIAC SURGERY    . CORONARY ARTERY BYPASS GRAFT  2006   Post CABG surgery with a LIMA to the LAD, a vein to the diagonal ,,a vein to the the obtuse marginal and vein to the PDA.  [name] CORONARY STENT PLACEMENT  2010  . EAR CYST EXCISION Right 10/05/2018   Procedure: Right Partial pinnectomy;  Surgeon: Izora Gala, MD;  Location: [facility];  Service: ENT;  Laterality: Right;  . GRAFT(S) ANGIOGRAM  10/21/2011   Procedure: GRAFT(S) Cyril Loosen;  Surgeon: Leonie Man, MD;  Location: [facility] CATH LAB;  Service: Cardiovascular;;  . LEFT HEART CATHETERIZATION WITH CORONARY ANGIOGRAM N/A 10/21/2011   Procedure: LEFT HEART CATHETERIZATION WITH CORONARY ANGIOGRAM;  Surgeon: Leonie Man, MD;  Location: [facility] CATH LAB;  Service: Cardiovascular;  Laterality: N/A;  . LYMPH NODE BIOPSY Right 10/05/2018   Procedure: sentinel  NODE BIOPSY with right partial modified neck dissection;  Surgeon: Izora Gala, MD;  Location: [place];  Service: ENT;  Laterality: Right;  . PORTA CATH INSERTION N/A 07/12/2019   Procedure: PORTA CATH INSERTION;  Surgeon: Algernon Huxley, MD;  Location: [place] CV LAB;  Service: Cardiovascular;  Laterality: N/A;  . ROBOT ASSISTED LAPAROSCOPIC NEPHRECTOMY Right 12/13/2019   Procedure: XI ROBOTIC ASSISTED LAPAROSCOPIC NEPHRECTOMY;  Surgeon: Hollice Espy, MD;  Location: [facility] ORS;  Service: Urology;  Laterality: Right;  . 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 ([place])  07/02/2019 Cancer Staging   Staging form: Lung, AJCC 8th Edition - Clinical stage from 07/02/2019: Stage IA3 (cT1c, cN0, cM0) - Signed by Sindy Guadeloupe, MD on 07/05/2019   07/05/2019 Initial Diagnosis   Malignant neoplasm of upper lobe of right lung ([place])   07/20/2019 -  Chemotherapy   The patient had palonosetron (ALOXI) injection 0.25 mg, 0.25 mg, Intravenous,  Once, 4 of 4 cycles Administration: 0.25 mg (07/20/2019), 0.25 mg (08/10/2019), 0.25 mg (08/31/2019), 0.25 mg (09/28/2019) pegfilgrastim (NEULASTA ONPRO KIT) injection 6 mg, 6 mg, Subcutaneous, Once, 1 of 1 cycle Administration: 6 mg (09/02/2019) CARBOplatin (PARAPLATIN) 440 mg in sodium chloride 0.9 % 250 mL chemo infusion, 440 mg (100 % of original dose 439.5 mg), Intravenous,  Once, 4 of 4 cycles Dose modification:   (original dose 439.5 mg, Cycle 1) Administration: 440 mg (07/20/2019), 440 mg (08/10/2019), 350 mg (08/31/2019), 400 mg (09/28/2019) etoposide (VEPESID) 200 mg in sodium chloride 0.9 % 500 mL chemo infusion, 100 mg/m2 = 200 mg, Intravenous,  Once, 4 of 4 cycles Administration: 200 mg (07/20/2019), 200 mg (07/21/2019), 200 mg (09/02/2019), 200 mg (08/10/2019), 200 mg (07/22/2019), 200 mg (08/11/2019), 200 mg (08/12/2019), 200 mg (09/01/2019), 200 mg (08/31/2019), 200 mg (09/28/2019), 200 mg (09/29/2019) fosaprepitant (EMEND) 150 mg, dexamethasone (DECADRON) 12 mg in sodium chloride 0.9 % 145 mL IVPB, , Intravenous,  Once, 4 of 4 cycles Administration:  (07/20/2019),  (08/10/2019),  (08/31/2019),  (09/28/2019)  for chemotherapy treatment.      ALLERGIES:  is allergic to codeine and niacin and related.  MEDICATIONS:  Current Facility-Administered Medications  Medication Dose Route Frequency Provider Last Rate Last Admin  . 0.9 %  sodium chloride infusion   Intravenous PRN Skipper Cliche A, MD 10 mL/hr at  04/19/20 0638 250 mL at 04/19/20 0638  . acetaminophen (TYLENOL) tablet 650 mg  650 mg Oral Q6H PRN Ivor Costa, MD      . albuterol (PROVENTIL) (2.5 MG/3ML) 0.083% nebulizer solution 2.5 mg  2.5 mg Nebulization Q4H PRN Ivor Costa, MD      . ALPRAZolam Duanne Moron) tablet 0.25 mg  0.25 mg Oral TID Ivor Costa, MD   0.25 mg at 04/19/20 0930  . aspirin EC tablet 81 mg  81 mg Oral Daily Ivor Costa, MD   81 mg at 04/19/20 0930  . atorvastatin (LIPITOR) tablet 40 mg  40 mg Oral QHS Ivor Costa, MD   40 mg at 04/18/20 2220  . busPIRone (BUSPAR) tablet 7.5 mg  7.5 mg Oral BID Ivor Costa, MD   7.5 mg at 04/19/20 0933  . cyclobenzaprine (FLEXERIL) tablet 5 mg  5 mg Oral TID PRN Ivor Costa, MD      . dextromethorphan-guaiFENesin [facility] DM) 30-600 MG per 12 hr tablet 1 tablet  1 tablet Oral BID Ivor Costa, MD  1 tablet at 04/19/20 0931  . enoxaparin (LOVENOX) injection 30 mg  30 mg Subcutaneous Q24H Ivor Costa, MD   30 mg at 04/18/20 2224  . ezetimibe (ZETIA) tablet 10 mg  10 mg Oral Daily Ivor Costa, MD   10 mg at 04/19/20 0930  . fluticasone furoate-vilanterol (BREO ELLIPTA) 100-25 MCG/INH 1 puff  1 puff Inhalation Daily Ivor Costa, MD   1 puff at 04/19/20 0934   And  . umeclidinium bromide (INCRUSE ELLIPTA) 62.5 MCG/INH 1 puff  1 puff Inhalation Daily Ivor Costa, MD   1 puff at 04/19/20 0934  . insulin aspart (novoLOG) injection 0-5 Units  0-5 Units Subcutaneous QHS Ivor Costa, MD      . insulin aspart (novoLOG) injection 0-9 Units  0-9 Units Subcutaneous TID WC Ivor Costa, MD      . ipratropium-albuterol (DUONEB) 0.5-2.5 (3) MG/3ML nebulizer solution 3 mL  3 mL Nebulization Q6H Shahmehdi, Seyed A, MD      . nicotine (NICODERM CQ - dosed in mg/24 hours) patch 21 mg  21 mg Transdermal Daily Ivor Costa, MD      . ondansetron [facility]) injection 4 mg  4 mg Intravenous Q8H PRN Ivor Costa, MD      . oxyCODONE (Oxy IR/ROXICODONE) immediate release tablet 10 mg  10 mg Oral Q4H PRN Ivor Costa, MD      .  piperacillin-tazobactam (ZOSYN) IVPB 3.375 g  3.375 g Intravenous Q8H Oswald Hillock, RPH 12.5 mL/hr at 04/19/20 0640 3.375 g at 04/19/20 0640  . ranolazine (RANEXA) 12 hr tablet 1,000 mg  1,000 mg Oral BID Ivor Costa, MD   1,000 mg at 04/19/20 8299   Facility-Administered Medications Ordered in Other Encounters  Medication Dose Route Frequency Provider Last Rate Last Admin  . heparin lock flush 100 unit/mL  500 Units Intravenous Once Sindy Guadeloupe, MD      . sodium chloride flush (NS) 0.9 % injection 10 mL  10 mL Intravenous PRN Sindy Guadeloupe, MD      . sodium chloride flush (NS) 0.9 % injection 10 mL  10 mL Intravenous PRN Sindy Guadeloupe, MD   10 mL at 08/31/19 1123    VITAL SIGNS: BP (!) 106/56 (BP Location: Left Arm)   Pulse 75   Temp 98 F (36.7 C) (Oral)   Resp 18   Ht _0  (1.727 m)   Wt 168 lb 11.2 oz (76.5 kg)   SpO2 100%   BMI 25.65 kg/m  Filed Weights   04/18/20 0754 04/19/20 0226  Weight: 168 lb 12.8 oz (76.6 kg) 168 lb 11.2 oz (76.5 kg)    Estimated body mass index is 25.65 kg/m as calculated from the following:   Height as of this encounter: _1  (1.727 m).   Weight as of this encounter: 168 lb 11.2 oz (76.5 kg).  LABS: CBC:    Component Value Date/Time   WBC 12.8 (H) 04/19/2020 0449   HGB 9.6 (L) 04/19/2020 0449   HGB 10.9 (L) 11/15/2019 0827   HCT 30.3 (L) 04/19/2020 0449   HCT 34.5 (L) 11/15/2019 0827   PLT 185 04/19/2020 0449   PLT 282 11/15/2019 0827   MCV 94.1 04/19/2020 0449   MCV 97 11/15/2019 0827   MCV 93 07/13/2014 1331   NEUTROABS 10.6 (H) 04/18/2020 0804   LYMPHSABS 1.0 04/18/2020 0804   MONOABS 1.8 (H) 04/18/2020 0804   EOSABS 0.0 04/18/2020 0804   BASOSABS 0.1 04/18/2020 0804   Comprehensive Metabolic  Panel:    Component Value Date/Time   NA 135 04/19/2020 0449   NA 142 11/15/2019 0827   NA 139 07/13/2014 1331   K 4.9 04/19/2020 0449   K 4.1 07/13/2014 1331   CL 102 04/19/2020 0449   CL 107 07/13/2014 1331   CO2 24  04/19/2020 0449   CO2 26 07/13/2014 1331   BUN 37 (H) 04/19/2020 0449   BUN 9 11/15/2019 0827   BUN 16 07/13/2014 1331   CREATININE 2.03 (H) 04/19/2020 0449   CREATININE 1.32 (H) 01/03/2017 1303   GLUCOSE 129 (H) 04/19/2020 0449   GLUCOSE 102 (H) 07/13/2014 1331   CALCIUM 8.6 (L) 04/19/2020 0449   CALCIUM 8.7 07/13/2014 1331   AST 56 (H) 04/18/2020 0804   AST 14 (L) 07/13/2014 1331   ALT 17 04/18/2020 0804   ALT 23 07/13/2014 1331   ALKPHOS 85 04/18/2020 0804   ALKPHOS 78 07/13/2014 1331   BILITOT 1.1 04/18/2020 0804   BILITOT 0.4 11/15/2019 0827   BILITOT 0.8 07/13/2014 1331   PROT 6.7 04/18/2020 0804   PROT 6.7 11/15/2019 0827   PROT 7.3 07/13/2014 1331   ALBUMIN 3.2 (L) 04/18/2020 0804   ALBUMIN 3.7 (L) 11/15/2019 0827   ALBUMIN 3.5 07/13/2014 1331    RADIOGRAPHIC STUDIES: CT ABDOMEN PELVIS WO CONTRAST  Result Date: 04/18/2020 CLINICAL DATA:  Shortness of breath, altered mental status and not eating or drinking. 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 EXAM: CT CHEST, ABDOMEN AND PELVIS WITHOUT CONTRAST TECHNIQUE: Multidetector CT imaging of the chest, abdomen and pelvis was performed following the standard protocol without IV contrast. COMPARISON:  CT scan 03/03/2020 FINDINGS: CT CHEST FINDINGS Cardiovascular: The heart is normal in size. No pericardial effusion. Stable tortuosity and extensive calcification of the thoracic aorta and coronary arteries. Surgical changes from coronary artery bypass surgery. Mediastinum/Nodes: Stable scattered sub 8 mm mediastinal and hilar lymph nodes. No supraclavicular or axillary adenopathy. Lungs/Pleura: Stable advanced emphysematous changes and pulmonary scarring. Progressive pulmonary metastatic disease. Stable radiation changes in the right upper lobe laterally. No obvious recurrent tumor in this location. Right lower lobe airspace consolidation with air bronchograms suggesting pneumonia. The left lung is clear of an acute  process. Musculoskeletal: New lytic bone lesion involving the right scapula is noted. There is also a progressive destructive lytic bone lesion involving the left scapula. No obvious sternal or thoracic vertebral body lesions. No obvious rib lesions. CT ABDOMEN PELVIS FINDINGS Hepatobiliary: No obvious hepatic lesions are identified without contrast. Layering high attenuation material in the gallbladder is likely sludge. No common bile duct dilatation. Pancreas: No pancreatic mass, inflammation or ductal dilatation. Spleen: Normal size.  No focal lesions. Adrenals/Urinary Tract: Enlarging bilateral adrenal gland metastasis. Status post right nephrectomy. Stable left renal cysts. The bladder is unremarkable. Stomach/Bowel: The stomach, duodenum, small bowel and colon are grossly normal without contrast. Vascular/Lymphatic: Stable advanced vascular calcifications. No mesenteric or retroperitoneal mass or adenopathy. Reproductive: The prostate gland and seminal vesicles are unremarkable Other: No pelvic mass or adenopathy. No free pelvic fluid collections. No inguinal mass or adenopathy. No abdominal wall hernia or subcutaneous lesions. Musculoskeletal: Progressive destructive bone lesion involving the left ischium. Progressive destructive lesion involving the left ilium anteriorly. Slight progression of L3 lytic metastasis. IMPRESSION: 1. Progressive pulmonary metastatic disease. 2. Right lower lobe airspace consolidation with air bronchograms suggesting pneumonia. 3. Enlarging bilateral adrenal gland metastasis. 4. Progressive osseous metastatic disease. 5. Stable radiation changes in the right upper lobe laterally. No obvious  recurrent tumor in this location. Aortic Atherosclerosis (ICD10-I70.0) and Emphysema (ICD10-J43.9). Electronically Signed   By: Marijo Sanes M.D.   On: 04/18/2020 10:30   CT Chest Wo Contrast  Result Date: 04/18/2020 CLINICAL DATA:  Shortness of breath, altered mental status and not eating  or drinking. 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 EXAM: CT CHEST, ABDOMEN AND PELVIS WITHOUT CONTRAST TECHNIQUE: Multidetector CT imaging of the chest, abdomen and pelvis was performed following the standard protocol without IV contrast. COMPARISON:  CT scan 03/03/2020 FINDINGS: CT CHEST FINDINGS Cardiovascular: The heart is normal in size. No pericardial effusion. Stable tortuosity and extensive calcification of the thoracic aorta and coronary arteries. Surgical changes from coronary artery bypass surgery. Mediastinum/Nodes: Stable scattered sub 8 mm mediastinal and hilar lymph nodes. No supraclavicular or axillary adenopathy. Lungs/Pleura: Stable advanced emphysematous changes and pulmonary scarring. Progressive pulmonary metastatic disease. Stable radiation changes in the right upper lobe laterally. No obvious recurrent tumor in this location. Right lower lobe airspace consolidation with air bronchograms suggesting pneumonia. The left lung is clear of an acute process. Musculoskeletal: New lytic bone lesion involving the right scapula is noted. There is also a progressive destructive lytic bone lesion involving the left scapula. No obvious sternal or thoracic vertebral body lesions. No obvious rib lesions. CT ABDOMEN PELVIS FINDINGS Hepatobiliary: No obvious hepatic lesions are identified without contrast. Layering high attenuation material in the gallbladder is likely sludge. No common bile duct dilatation. Pancreas: No pancreatic mass, inflammation or ductal dilatation. Spleen: Normal size.  No focal lesions. Adrenals/Urinary Tract: Enlarging bilateral adrenal gland metastasis. Status post right nephrectomy. Stable left renal cysts. The bladder is unremarkable. Stomach/Bowel: The stomach, duodenum, small bowel and colon are grossly normal without contrast. Vascular/Lymphatic: Stable advanced vascular calcifications. No mesenteric or retroperitoneal mass or adenopathy. Reproductive: The  prostate gland and seminal vesicles are unremarkable Other: No pelvic mass or adenopathy. No free pelvic fluid collections. No inguinal mass or adenopathy. No abdominal wall hernia or subcutaneous lesions. Musculoskeletal: Progressive destructive bone lesion involving the left ischium. Progressive destructive lesion involving the left ilium anteriorly. Slight progression of L3 lytic metastasis. IMPRESSION: 1. Progressive pulmonary metastatic disease. 2. Right lower lobe airspace consolidation with air bronchograms suggesting pneumonia. 3. Enlarging bilateral adrenal gland metastasis. 4. Progressive osseous metastatic disease. 5. Stable radiation changes in the right upper lobe laterally. No obvious recurrent tumor in this location. Aortic Atherosclerosis (ICD10-I70.0) and Emphysema (ICD10-J43.9). Electronically Signed   By: Marijo Sanes M.D.   On: 04/18/2020 10:30   US Venous Img Lower Unilateral Right  Result Date: 04/18/2020 CLINICAL DATA:  69 year old male with a history swelling EXAM: RIGHT LOWER EXTREMITY VENOUS DOPPLER ULTRASOUND TECHNIQUE: Gray-scale sonography with graded compression, as well as color Doppler and duplex ultrasound were performed to evaluate the lower extremity deep venous systems from the level of the common femoral vein and including the common femoral, femoral, profunda femoral, popliteal and calf veins including the posterior tibial, peroneal and gastrocnemius veins when visible. The superficial great saphenous vein was also interrogated. Spectral Doppler was utilized to evaluate flow at rest and with distal augmentation maneuvers in the common femoral, femoral and popliteal veins. COMPARISON:  None. FINDINGS: Contralateral Common Femoral Vein: Respiratory phasicity is normal and symmetric with the symptomatic side. No evidence of thrombus. Normal compressibility. Common Femoral Vein: No evidence of thrombus. Normal compressibility, respiratory phasicity and response to augmentation.  Saphenofemoral Junction: No evidence of thrombus. Normal compressibility and flow on color Doppler imaging. Profunda Femoral Vein:  No evidence of thrombus. Normal compressibility and flow on color Doppler imaging. Femoral Vein: No evidence of thrombus. Normal compressibility, respiratory phasicity and response to augmentation. Popliteal Vein: No evidence of thrombus. Normal compressibility, respiratory phasicity and response to augmentation. Calf Veins: No evidence of thrombus. Normal compressibility and flow on color Doppler imaging. Superficial Great Saphenous Vein: No evidence of thrombus. Normal compressibility and flow on color Doppler imaging. Other Findings:  None. IMPRESSION: Sonographic survey of the right lower extremity negative for DVT Electronically Signed   By: Corrie Mckusick D.O.   On: 04/18/2020 09:55   DG Chest Port 1 View  Result Date: 04/18/2020 CLINICAL DATA:  ALTERED MENTAL STATUS EXAM: PORTABLE CHEST 1 VIEW COMPARISON:  12/14/2019; 06/25/2019; chest CT-03/03/2020 FINDINGS: Grossly unchanged enlarged cardiac silhouette and mediastinal contours post median sternotomy and CABG. Slight retraction of a left jugular approach port a catheter with tip now projected over the central aspect of the left innominate vein. The lungs remain hyperexpanded. Ill-defined nodular interstitial opacities involving the peripheral aspect of the right mid lung as well as the right lung base are unchanged. No new discrete focal airspace opacities. Persistent blunting of the right costophrenic angle without definite pleural effusion. Note is made of a crescentic lucency below the left hemidiaphragm. No acute osseous abnormalities. IMPRESSION: 1. Similar findings of cardiomegaly and known right greater than left pulmonary metastatic disease. 2. Crescentic lucency below the left hemidiaphragm may represent air within the stomach, however pneumoperitoneum could have a similar appearance. Correlation for abdominal symptoms  is advised. Further evaluation with decubital abdominal radiographic series could be performed as indicated. 3. Continued retraction of left jugular approach port a catheter with tip now projected over the central aspect the left innominate vein. Critical Value/emergent results were called by telephone at the time of interpretation on 04/18/2020 at 8:23 am to provider PHILLIP STAFFORD , who verbally acknowledged these results. Electronically Signed   By: Sandi Mariscal M.D.   On: 04/18/2020 08:22    PERFORMANCE STATUS (ECOG) : 3 - Symptomatic, >50% confined to bed  Review of Systems Unless otherwise noted, a complete review of systems is negative.  Physical Exam General: NAD, frail appearing Pulmonary: Unlabored Extremities: no edema, no joint deformities Skin: no rashes Neurological: Weakness, some confusion  IMPRESSION: Patient had some lethargy and confusion this morning when seen.  He was able to engage me in a brief conversation but kept his eyes closed.  He was not oriented to location or situation.   I met in person with patient's wife.  Patient's daughter participated in the conversation via phone call.  Family report the patient has been declining over the past couple of weeks.  He has had more difficulty ambulating and has required more assistance from family with ADLs.  Family report that they have been told that the cancer is "terminal" and that hospice has been previously recommended.  They recognize that he is likely not a candidate for further systemic treatment.  We did discuss the option of hospice involvement in detail.  Family would like for patient to go home with hospice when medically ready.  They would be interested in future residential hospice in the event of decline.  Both wife and daughter felt the patient would be in agreement with hospice involvement.  We discussed CODE STATUS.  Wife says that patient would be a DNR/DNI.  She would not be interested in aggressive  measures at end-of-life.  She would prefer to instead keep him comfortable.  PLAN: -  Best supportive care -Home with hospice when medically ready -TOC consulted to help coordinate hospice -DNR/DNI  Case and plan discussed with Dr. Janese Banks and Dr. Roger Shelter    Time Total: 60 minutes  Visit consisted of counseling and education dealing with the complex and emotionally intense issues of symptom management and palliative care in the setting of serious and potentially life-threatening illness.Greater than 50%  of this time was spent counseling and coordinating care related to the above assessment and plan.  Signed by: Altha Harm, PhD, NP-C

## 2020-04-19 NOTE — Evaluation (Addendum)
Clinical/Bedside Swallow Evaluation Patient Details  Name: Troy [name] MRN: [id number] Date of Birth: [birth year]  Today's Date: 04/19/2020 Time: SLP Start Time (ACUTE ONLY): 1125 SLP Stop Time (ACUTE ONLY): 1210 SLP Time Calculation (min) (ACUTE ONLY): 45 min  Past Medical History:  Past Medical History:  Diagnosis Date  . Anxiety   . COPD (chronic obstructive pulmonary disease) ([place])   . Coronary artery disease 2006   Acute coronary syndrome in March 2006.   . Diabetes mellitus without complication ([place])   . Dyslipidemia   . Hypertension 10/09/2010   EF 40-45%  . Malignant neoplasm of upper lobe of right lung (H[facility])    Chemo + rad tx's.   . 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 Myocardial infarction ([place])   . Renal cancer, right ([place]) 11/2019   Nephrectomy.  . Sleep apnea    uses cpap machine   Past Surgical History:  Past Surgical History:  Procedure Laterality Date  . Arterial evalation      no evidence of ICA stenosis  . CARDIAC CATHETERIZATION    . CARDIAC SURGERY    . CORONARY ARTERY BYPASS GRAFT  2006   Post CABG surgery with a LIMA to the LAD, a vein to the diagonal ,,a vein to the the obtuse marginal and vein to the PDA.  [name] CORONARY STENT PLACEMENT  2010  . EAR CYST EXCISION Right 10/05/2018   Procedure: Right Partial pinnectomy;  Surgeon: Izora Gala, MD;  Location: [place];  Service: ENT;  Laterality: Right;  . GRAFT(S) ANGIOGRAM  10/21/2011   Procedure: GRAFT(S) Cyril Loosen;  Surgeon: Leonie Man, MD;  Location: [facility] CATH LAB;  Service: Cardiovascular;;  . LEFT HEART CATHETERIZATION WITH CORONARY ANGIOGRAM N/A 10/21/2011   Procedure: LEFT HEART CATHETERIZATION WITH CORONARY ANGIOGRAM;  Surgeon: Leonie Man, MD;  Location: [facility] CATH LAB;  Service: Cardiovascular;  Laterality: N/A;  . LYMPH NODE BIOPSY Right 10/05/2018   Procedure: sentinel NODE BIOPSY with right partial modified neck dissection;  Surgeon: Izora Gala, MD;  Location: [place];  Service:  ENT;  Laterality: Right;  . PORTA CATH INSERTION N/A 07/12/2019   Procedure: PORTA CATH INSERTION;  Surgeon: Algernon Huxley, MD;  Location: [place] CV LAB;  Service: Cardiovascular;  Laterality: N/A;  . ROBOT ASSISTED LAPAROSCOPIC NEPHRECTOMY Right 12/13/2019   Procedure: XI ROBOTIC ASSISTED LAPAROSCOPIC NEPHRECTOMY;  Surgeon: Hollice Espy, MD;  Location: [facility] ORS;  Service: Urology;  Laterality: Right;  . SURGERY OF LIP     SKIN CANCER   HPI:  Pt is a 69 y.o. 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  Per chart notes, As part of his staging work-up for cancer he was found to have right upper lobe nodule.  He ultimately underwent PET scan and was found to have hypermetabolic lesions in the lung, left adrenal, and right kidney.  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  Patient is status post concurrent chemoradiation.  Patient was admitted to the hospital on 04/18/2020 for sepsis likely from pneumonia.  Repeat CT of the chest, abdomen, and pelvis revealed progressive disease in the lung, skeleton, and adrenals.  Palliative Care/Hospice is following pt.    Assessment / Plan / Recommendation Clinical Impression  Pt appears to present w/ grossly adequate oropharyngeal phase swallow function w/ No pharyngeal phase dysphagia noted, No neuromuscular deficits noted. Pt has had recent labial/oral Ca txs which have caused oral soreness of Gums especially when wearing his Dentures(he has Not been wearing the Dentures recently) as well as an Abrasion/healing of  Right corner of lip - tender/sore still. Pt denied Odynophagia.Pt consumed po trials w/ No overt, clinical s/s of aspiration during po trials. Pt appears at reduced risk for aspiration following general aspiration precautions. During po trials, pt consumed consistencies of ice chips, thin liquids, purees, and minced solids(moistened for ease of gumming) w/ no overt coughing, decline in vocal quality, or change in respiratory presentation during/post trials. Oral phase appeared [facility] w/ timely bolus management, gumming/mashing, and control of bolus propulsion for A-P transfer for swallowing of consistencies given. Oral clearing achieved w/ all trial consistencies. No full solid food trials given d/t not wearing Dentures, oral soreness -- recommended more Minced foods for ease. OM Exam appeared grossly [facility] w/ no lingual weakness noted; no erythema or lesions. Right labial corner appeared almost healed w/ scab, tender to touch w/ tongue blade. Labial seal during oral phase wfl. Speech Clear. Pt fed self w/ setup support.  Recommend a more Minced food consistency diet for oral ease of intake w/ gravies to moisten foods, soups; Thin liquids. Recommend general aspiration precautions, Pills WHOLE in Puree for safer, easier swallowing as needed. Education given on Pills in Puree; food consistencies and easy to eat options. Recommend continue oral hygiene regimen as established during his oral Ca care but to avoid swallowing any numbing mouth/throat sprays Before eating/drining. Recommend frequent oral care for hygiene; swab kits given. Handouts on above. NSG to reconsult if any new needs arise. NSG agreed. Noted Hospice Liason following to consult.  SLP Visit Diagnosis: Dysphagia, oral phase (R13.11) (mild)    Aspiration Risk  Mild aspiration risk;Risk for inadequate nutrition/hydration    Diet Recommendation  Minced foods diet w/ gravies to moisten foods, Soups; Thin liquids. General aspiration  precautions.  Medication Administration: Whole meds with puree (for ease of swallowing)    Other  Recommendations Recommended Consults:  (Dietician f/u) Oral Care Recommendations: Oral care BID;Oral care before and after PO;Patient independent with oral care Other Recommendations:  (n/a)   Follow up Recommendations None      Frequency and Duration  (n/a)   (n/a)       Prognosis Prognosis for Safe Diet Advancement: Fair Barriers to Reach Goals: Severity of deficits      Swallow Study   General Date of Onset: 04/18/20 HPI: Pt is a 69 y.o. 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  Per chart notes, As part of his staging work-up for cancer he was found to have right upper lobe nodule.  He ultimately underwent PET scan and was found to have hypermetabolic lesions in the lung, left adrenal, and right kidney.  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  Patient is status post concurrent chemoradiation.  Patient was admitted to the hospital on  04/18/2020 for sepsis likely from pneumonia.  Repeat CT of the chest, abdomen, and pelvis revealed progressive disease in the lung, skeleton, and adrenals.  Palliative Care/Hospice is following pt.  Type of Study: Bedside Swallow Evaluation Previous Swallow Assessment: none reported Diet Prior to this Study: NPO Temperature Spikes Noted: No (wbc 12.8 declining) Respiratory Status: Nasal cannula (4L) History of Recent Intubation: No Behavior/Cognition: Alert;Cooperative;Pleasant mood Oral Cavity Assessment:   (Abrasion/healing of Right corner/lip - tender/sore) Oral Care Completed by SLP: Yes Oral Cavity - Dentition: Edentulous Vision: Functional for self-feeding Self-Feeding Abilities: Able to feed self;Needs assist;Needs set up (weakness ) Patient Positioning: Upright in bed (supported) Baseline Vocal Quality: Normal Volitional Cough: Strong Volitional Swallow: Able to elicit    Oral/Motor/Sensory Function Overall Oral Motor/Sensory Function: Within functional limits (gag +)   Ice Chips Ice chips: Within functional limits Presentation: Spoon (fed; 3 trials)   Thin Liquid Thin Liquid: Within functional limits Presentation: Cup;Self Fed;Straw (~10+ trials)    Nectar Thick Nectar Thick Liquid: Not tested   Honey Thick Honey Thick Liquid: Not tested   Puree Puree: Within functional limits Presentation: Self Fed;Spoon (~2-3 ozs)   Solid     Solid: Impaired Presentation: Self Fed;Spoon (5 trials) Oral Phase Impairments: Impaired mastication (edentulous) Pharyngeal Phase Impairments:  (none) Other Comments: minced foods would be easiest d/t gum soreness when wearing Dentures -- he has NOT been wearing his Dentures and has been eating less in past couple of weeks       Orinda Kenner, MS, CCC-SLP [place] Test 04/19/2020,3:18 PM

## 2020-04-19 NOTE — Progress Notes (Addendum)
[facility] Liaison note:  New referral for [name] services at home received from [facility]. Patient information given to referral. Hospice eligibility confirmed.  Writer met in the room with patient and his wife Troy [name] to initiate education regarding hospice services, philosophy and team approach to care with understanding voiced. DME needs discussed, delivery planned for 7/8.  Hospice contact numbers given to [name]. Will continue to follow through discharge. Thank you for the opportunity to be involved in the care of this patient and his family. Flo Shanks BSN, RN, [place] [phone]

## 2020-04-19 NOTE — Progress Notes (Signed)
Patient transferred from stretcher to bed by staff, oriented to room, and call light use. Patient's wife Arville Go at bedside. Patient being closely monitored and near nurses station. Patient is stable at this time.

## 2020-04-19 NOTE — ED Notes (Signed)
Report called to lenoir rn floor nurse

## 2020-04-19 NOTE — Progress Notes (Signed)
Report received from Amy, RN on patient awaiting arrival.

## 2020-04-20 ENCOUNTER — Ambulatory Visit: Payer: Medicare Other

## 2020-04-20 ENCOUNTER — Telehealth: Payer: Self-pay | Admitting: *Deleted

## 2020-04-20 LAB — GLUCOSE, CAPILLARY
Glucose-Capillary: 112 mg/dL — ABNORMAL HIGH (ref 70–99)
Glucose-Capillary: 138 mg/dL — ABNORMAL HIGH (ref 70–99)
Glucose-Capillary: 122 mg/dL — ABNORMAL HIGH (ref 70–99)
Glucose-Capillary: 106 mg/dL — ABNORMAL HIGH (ref 70–99)

## 2020-04-20 LAB — CBC
HCT: 27.8 % — ABNORMAL LOW (ref 39.0–52.0)
Hemoglobin: 9.1 g/dL — ABNORMAL LOW (ref 13.0–17.0)
MCH: 30.7 pg (ref 26.0–34.0)
MCHC: 32.7 g/dL (ref 30.0–36.0)
MCV: 93.9 fL (ref 80.0–100.0)
Platelets: 211 10*3/uL (ref 150–400)
RBC: 2.96 MIL/uL — ABNORMAL LOW (ref 4.22–5.81)
RDW: 15 % (ref 11.5–15.5)
WBC: 12.8 10*3/uL — ABNORMAL HIGH (ref 4.0–10.5)
nRBC: 0 % (ref 0.0–0.2)

## 2020-04-20 LAB — BASIC METABOLIC PANEL
Anion gap: 10 (ref 5–15)
BUN: 42 mg/dL — ABNORMAL HIGH (ref 8–23)
CO2: 26 mmol/L (ref 22–32)
Calcium: 8.4 mg/dL — ABNORMAL LOW (ref 8.9–10.3)
Chloride: 102 mmol/L (ref 98–111)
Creatinine, Ser: 1.79 mg/dL — ABNORMAL HIGH (ref 0.61–1.24)
GFR calc Af Amer: 43 mL/min — ABNORMAL LOW (ref >60)
GFR calc non Af Amer: 37 mL/min — ABNORMAL LOW (ref >60)
Glucose, Bld: 127 mg/dL — ABNORMAL HIGH (ref 70–99)
Potassium: 4.5 mmol/L (ref 3.5–5.1)
Sodium: 138 mmol/L (ref 135–145)

## 2020-04-20 LAB — URINE CULTURE: Culture: NO GROWTH

## 2020-04-20 LAB — PROCALCITONIN: Procalcitonin: <0.1 ng/mL

## 2020-04-20 LAB — STREP PNEUMONIAE URINARY ANTIGEN: Strep Pneumo Urinary Antigen: NEGATIVE

## 2020-04-20 NOTE — Progress Notes (Signed)
PT Cancellation Note  Patient Details Name: Troy [name] MRN: [id number] DOB: 1951-02-07   Cancelled Treatment:    Reason Eval/Treat Not Completed: Other (comment).  PT consult received.  Chart reviewed.  Upon PT arrival, [facility] Liaison present.  Will re-attempt PT evaluation at a later date/time as able.  Leitha Bleak, PT 04/20/20, 11:16 AM

## 2020-04-20 NOTE — Progress Notes (Signed)
PROGRESS NOTE    Patient: Troy [name]                            PCP: Sherron Monday, MD                    DOB: [birth year]            DOA: 04/18/2020 ZGY:[id number]             DOS: 04/20/2020, 1:18 PM   LOS: 2 days   Date of Service: The patient was seen and examined on 04/20/2020  Subjective:   The patient was seen and examined.  Stable no acute distress.  Sitting at the side of bed. Hemodynamically stable.  Family member present at bedside daughter and sister-in-law.  Plan of care was discussed with the patient and family members at bedside   Brief Narrative:  Per HPI  BRAYDAN MARRIOTT is a 69 y.o. 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 (HCC)   Acute on chronic respiratory failure with hypoxia and hypercapnia (HCC)   HLD (hyperlipidemia)   COPD (chronic obstructive pulmonary disease) (HCC)   Sepsis (HCC)   Depression with anxiety   CAD (coronary artery disease)   Chronic diastolic CHF (congestive heart failure) (HCC)   Acute renal failure superimposed on stage 3a chronic kidney disease (HCC)   Normocytic anemia   Acute metabolic encephalopathy   Palliative care encounter   Sepsis and acute on chronic respiratory failure with hypoxia and hypercapnia due to HCAP (healthcare-associated  pneumonia): -Patient met the criteria for sepsis due to hypotension, leukocytosis.  Normal lactic acid, -Hypotension resolved with fluid resuscitation BP as low as 82/39, currently 109/65 -Currently  stable  - IV Vancomycin and cefepime were given in ED. Will switch cefepime to Zosyn to cover possible aspiration - Mucinex for cough  - Bronchodilators - Urine legionella and S. 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 -Palliative/hospice was consulted -Follow-up with oncology -recommended no further intervention, home with hospice   HLD (hyperlipidemia) -Lipitor, Zetia  COPD (chronic obstructive pulmonary disease) (HCC) -Bronchodilators  Depression with anxiety -Continue home medications  CAD (coronary artery disease): S/p of  CABG, No chest pain. -Continue aspirin, Zetia, Lipitor and Ranexa  Chronic diastolic CHF (congestive heart failure) ([place]):  2D echo 11/08/2019 showed EF of 50% with grade 1 diastolic dysfunction.  Patient has 1+ leg edema.  No JVD.  Does not seem to have pulmonary edema chest x-ray.  CHF does not seem to  be exacerbated, he is at risk of having CHF exacerbation. -Check BNP -Will not give diuretics due to sepsis and hypotension  Acute renal failure superimposed on stage 3a chronic kidney disease ([place]):  Baseline creatinine 1.0-1.2, recent creatinine 1.82 on 03/03/2020.  His creatinine is 2.45, BUN 33.  Likely due to multifactorial etiology, including decreased oral intake, possible ATN secondary to hypotension and continuation of Cozaar -Cozaar is on hold -IV fluid as above -Follow-up renal function by BMP  Normocytic anemia: Hgb 11.8 on 03/03/20 -->9.7. No active bleeding -Follow-up of CBC   Acute metabolic encephalopathy: Likely multifactorial etiology as listed above -Frequent neuro checks     DVT ppx: SQ Lovenox Code Status: DNR (I discussed with patient's wife, and explained the meaning of CODE STATUS. Wife states that patient is DNR) Hospice consulted, patient is accepted hospice at home. Family Communication:   Yes, patient's wife at bed side Disposition Plan:  Anticipate discharge back to previous environment Consults called:  none Admission status: progressive unit as inpt     Status is: Inpatient   ----------------------------------------------------------------------------------------------------------------------------------------------     Procedures:   No admission procedures for hospital encounter.     Antimicrobials:  Anti-infectives (From admission, onward)   Start     Dose/Rate Route Frequency Ordered Stop   04/18/20 1400  piperacillin-tazobactam (ZOSYN) IVPB 3.375 g     Discontinue     3.375 g 12.5 mL/hr over 240 Minutes Intravenous Every 8 hours 04/18/20 1308     04/18/20 0930  vancomycin (VANCOREADY) IVPB 500 mg/100 mL        500 mg 100 mL/hr over 60 Minutes Intravenous  Once 04/18/20 0833 04/18/20 1057   04/18/20 0800  vancomycin (VANCOCIN) IVPB 1000 mg/200 mL premix        1,000 mg 200 mL/hr over 60 Minutes Intravenous  Once 04/18/20  0756 04/18/20 0934   04/18/20 0800  ceFEPIme (MAXIPIME) 2 g in sodium chloride 0.9 % 100 mL IVPB        2 g 200 mL/hr over 30 Minutes Intravenous  Once 04/18/20 0756 04/18/20 0934       Medication:  . ALPRAZolam  0.25 mg Oral TID  . aspirin EC  81 mg Oral Daily  . atorvastatin  40 mg Oral QHS  . busPIRone  7.5 mg Oral BID  . dextromethorphan-guaiFENesin  1 tablet Oral BID  . enoxaparin (LOVENOX) injection  40 mg Subcutaneous Q24H  . fluticasone furoate-vilanterol  1 puff Inhalation Daily   And  . umeclidinium bromide  1 puff Inhalation Daily  . insulin aspart  0-5 Units Subcutaneous QHS  . insulin aspart  0-9 Units Subcutaneous TID WC  . ipratropium-albuterol  3 mL Nebulization Q6H  . nicotine  21 mg Transdermal Daily  . ranolazine  1,000 mg Oral BID    sodium chloride, acetaminophen, albuterol, cyclobenzaprine, ondansetron (ZOFRAN) IV, oxyCODONE   Objective:   Vitals:   04/19/20 2057 04/20/20 0557 04/20/20 0725 04/20/20 0830  BP:  (!) 96/54 109/67   Pulse:  80 82 86  Resp:   17 (!) 22  Temp:  97.9 F (36.6 C) 98 F (36.7 C)   TempSrc:  Oral Oral   SpO2: 95% 97% 94% 92%  Weight:      Height:        Intake/Output Summary (Last 24 hours) at 04/20/2020 1318 Last data filed at 04/20/2020 1000 Gross per 24 hour  Intake 1238.13 ml  Output 350 ml  Net 888.13 ml   Filed Weights   04/18/20 0754 04/19/20 0226  Weight: 76.6 kg 76.5 kg     Examination:    Physical Exam  Constitution:  Alert, cooperative, no distress,  Psychiatric: Normal and stable mood and affect, cognition intact,   HEENT: Normocephalic, PERRL, otherwise with in Normal limits  Chest:Chest symmetric Cardio vascular:  S1/S2, RRR, No murmure, No Rubs or Gallops  pulmonary: Clear to auscultation bilaterally, respirations unlabored, negative wheezes / crackles Abdomen: Soft, non-tender, non-distended, bowel sounds,no masses, no organomegaly Muscular skeletal: Limited exam - in bed, able to move all 4  extremities, Normal strength,  Neuro: CNII-XII intact. , normal motor and sensation, reflexes intact  Extremities: No pitting edema lower extremities, +2 pulses  Skin: Dry, warm to touch, negative for any Rashes, No open wounds Wounds: non-visible - nursing documentation       ------------------------------------------------------------------------------------------------------------------------------------------    LABs:  CBC Latest Ref Rng & Units 04/20/2020 04/19/2020 04/18/2020  WBC 4.0 - 10.5 K/uL 12.8(H) 12.8(H) 13.6(H)  Hemoglobin 13.0 - 17.0 g/dL 9.1(L) 9.6(L) 9.7(L)  Hematocrit 39 - 52 % 27.8(L) 30.3(L) 29.4(L)  Platelets 150 - 400 K/uL 211 185 220   CMP Latest Ref Rng & Units 04/20/2020 04/19/2020 04/18/2020  Glucose 70 - 99 mg/dL 127(H) 129(H) 92  BUN 8 - 23 mg/dL 42(H) 37(H) 33(H)  Creatinine 0.61 - 1.24 mg/dL 1.79(H) 2.03(H) 2.45(H)  Sodium 135 - 145 mmol/L 138 135 134(L)  Potassium 3.5 - 5.1 mmol/L 4.5 4.9 4.7  Chloride 98 - 111 mmol/L 102 102 98  CO2 22 - 32 mmol/L '26 24 25  '$ Calcium 8.9 - 10.3 mg/dL 8.4(L) 8.6(L) 8.4(L)  Total Protein 6.5 - 8.1 g/dL - - 6.7  Total Bilirubin 0.3 - 1.2 mg/dL - - 1.1  Alkaline Phos 38 - 126 U/L - - 85  AST 15 - 41 U/L - - 56(H)  ALT 0 - 44 U/L - - 17       Micro Results Recent Results (from the past 240 hour(s))  Blood Culture (routine x 2)     Status: None (Preliminary result)   Collection Time: 04/18/20  8:04 AM   Specimen: BLOOD  Result Value Ref Range Status   Specimen Description BLOOD BLOOD RIGHT ARM  Final   Special Requests   Final    BOTTLES DRAWN AEROBIC AND ANAEROBIC Blood Culture adequate volume   Culture   Final    NO GROWTH 2 DAYS Performed at [facility], [address] [place], [place] [address]    Report Status PENDING  Incomplete  Blood Culture (routine x 2)     Status: None (Preliminary result)   Collection Time: 04/18/20  8:05 AM   Specimen: BLOOD  Result Value Ref Range Status   Specimen  Description BLOOD RIGHT ANTECUBITAL  Final   Special Requests   Final    BOTTLES DRAWN AEROBIC AND ANAEROBIC Blood Culture adequate volume   Culture   Final    NO GROWTH 2 DAYS Performed at [facility], [address] [place], [place] [address]    Report Status PENDING  Incomplete  SARS Coronavirus 2 by RT PCR (hospital order, performed in [facility]  hospital lab) Nasopharyngeal Nasopharyngeal Swab     Status: None   Collection Time: 04/18/20  8:05 AM   Specimen: Nasopharyngeal Swab  Result Value Ref Range Status   SARS Coronavirus 2 NEGATIVE NEGATIVE Final    Comment: (NOTE) SARS-CoV-2 target nucleic acids are NOT DETECTED.  The SARS-CoV-2 RNA is generally detectable in upper and lower rroved or  cleared by the [place] and has been authorized for detection and/or diagnosis of SARS-CoV-2 by FDA under an Emergency Use Authorization (EUA).  This EUA will remain in effect (meaning this test can be used) for the duration of the COVID-19 declaration under Section 564(b)(1) of the Act, 21 U.S.C. section 360bbb-3(b)(1), unless the authorization is terminated or revoked sooner.  Performed at [facility], [address] [place], [place] [address]   Urine culture     Status: None   Collection Time: 04/18/20 12:30 PM   Specimen: Urine, Random  Result Value Ref Range Status   Specimen Description   Final    URINE, RANDOM Performed at [facility], [address], [place], [place] [address]    Special Requests   Final    NONE Performed at [facility], [address] [place], [place] [address]    Culture   Final    NO GROWTH Performed at [facility], [place] [address], [place], [place] [address]    Report Status 04/20/2020 FINAL  Final  MRSA PCR Screening     Status: None   Collection Time: 04/18/20  3:45 PM   Specimen: Nasal Mucosa; Nasopharyngeal  Result Value Ref Range Status   MRSA by PCR NEGATIVE NEGATIVE Final     Comment:        The GeneXpert MRSA Assay (FDA approved for NASAL specimens only), is one component of a comprehensive MRSA colonization surveillance program. It is not intended to diagnose MRSA infection nor to guide or monitor treatment for MRSA infections. Performed at [facility], [address], [place], [place] [address]     Radiology Reports CT ABDOMEN PELVIS WO CONTRAST  Result Date: 04/18/2020 CLINICAL DATA:  Shortness of breath, altered mental status and not eating or drinking. 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 EXAM: CT CHEST, ABDOMEN AND PELVIS WITHOUT CONTRAST TECHNIQUE: Multidetector CT imaging of the chest, abdomen and pelvis was performed following the standard protocol without IV contrast. COMPARISON:  CT scan 03/03/2020 FINDINGS: CT CHEST FINDINGS Cardiovascular: The heart is normal in size. No pericardial effusion. Stable tortuosity and extensive calcification of the thoracic aorta and coronary arteries. Surgical changes from coronary artery bypass surgery. Mediastinum/Nodes: Stable scattered sub 8 mm mediastinal and hilar lymph nodes. No supraclavicular or axillary adenopathy. Lungs/Pleura: Stable advanced emphysematous changes and pulmonary scarring. Progressive pulmonary metastatic disease. Stable radiation changes in the right upper lobe laterally. No obvious recurrent tumor in this location. Right lower lobe airspace consolidation with air bronchograms suggesting pneumonia. The left lung is clear of an acute process. Musculoskeletal: New lytic bone lesion involving the right scapula is noted. There is also a progressive destructive lytic bone lesion involving the left scapula. No obvious sternal or thoracic vertebral body lesions. No obvious rib lesions. CT ABDOMEN PELVIS FINDINGS Hepatobiliary: No obvious hepatic lesions are identified without contrast. Layering high attenuation material in the gallbladder is likely sludge. No common bile  duct dilatation. Pancreas: No pancreatic mass, inflammation or ductal dilatation. Spleen: Normal size.  No focal lesions. Adrenals/Urinary Tract: Enlarging bilateral adrenal gland  metastasis. Status post right nephrectomy. Stable left renal cysts. The bladder is unremarkable. Stomach/Bowel: The stomach, duodenum, small bowel and colon are grossly normal without contrast. Vascular/Lymphatic: Stable advanced vascular calcifications. No mesenteric or retroperitoneal mass or adenopathy. Reproductive: The prostate gland and seminal vesicles are unremarkable Other: No pelvic mass or adenopathy. No free pelvic fluid collections. No inguinal mass or adenopathy. No abdominal wall hernia or subcutaneous lesions. Musculoskeletal: Progressive destructive bone lesion involving the left ischium. Progressive destructive lesion involving the left ilium anteriorly. Slight progression of L3 lytic metastasis. IMPRESSION: 1. Progressive pulmonary metastatic disease. 2. Right lower lobe airspace consolidation with air bronchograms suggesting pneumonia. 3. Enlarging bilateral adrenal gland metastasis. 4. Progressive osseous metastatic disease. 5. Stable radiation changes in the right upper lobe laterally. No obvious recurrent tumor in this location. Aortic Atherosclerosis (ICD10-I70.0) and Emphysema (ICD10-J43.9). Electronically Signed   By: Marijo Sanes M.D.   On: 04/18/2020 10:30   CT Chest Wo Contrast  Result Date: 04/18/2020 CLINICAL DATA:  Shortness of breath, altered mental status and not eating or drinking. 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 EXAM: CT CHEST, ABDOMEN AND PELVIS WITHOUT CONTRAST TECHNIQUE: Multidetector CT imaging of the chest, abdomen and pelvis was performed following the standard protocol without IV contrast. COMPARISON:  CT scan 03/03/2020 FINDINGS: CT CHEST FINDINGS Cardiovascular: The heart is normal in size. No pericardial effusion. Stable tortuosity and extensive calcification of  the thoracic aorta and coronary arteries. Surgical changes from coronary artery bypass surgery. Mediastinum/Nodes: Stable scattered sub 8 mm mediastinal and hilar lymph nodes. No supraclavicular or axillary adenopathy. Lungs/Pleura: Stable advanced emphysematous changes and pulmonary scarring. Progressive pulmonary metastatic disease. Stable radiation changes in the right upper lobe laterally. No obvious recurrent tumor in this location. Right lower lobe airspace consolidation with air bronchograms suggesting pneumonia. The left lung is clear of an acute process. Musculoskeletal: New lytic bone lesion involving the right scapula is noted. There is also a progressive destructive lytic bone lesion involving the left scapula. No obvious sternal or thoracic vertebral body lesions. No obvious rib lesions. CT ABDOMEN PELVIS FINDINGS Hepatobiliary: No obvious hepatic lesions are identified without contrast. Layering high attenuation material in the gallbladder is likely sludge. No common bile duct dilatation. Pancreas: No pancreatic mass, inflammation or ductal dilatation. Spleen: Normal size.  No focal lesions. Adrenals/Urinary Tract: Enlarging bilateral adrenal gland metastasis. Status post right nephrectomy. Stable left renal cysts. The bladder is unremarkable. Stomach/Bowel: The stomach, duodenum, small bowel and colon are grossly normal without contrast. Vascular/Lymphatic: Stable advanced vascular calcifications. No mesenteric or retroperitoneal mass or adenopathy. Reproductive: The prostate gland and seminal vesicles are unremarkable Other: No pelvic mass or adenopathy. No free pelvic fluid collections. No inguinal mass or adenopathy. No abdominal wall hernia or subcutaneous lesions. Musculoskeletal: Progressive destructive bone lesion involving the left ischium. Progressive destructive lesion involving the left ilium anteriorly. Slight progression of L3 lytic metastasis. IMPRESSION: 1. Progressive pulmonary  metastatic disease. 2. Right lower lobe airspace consolidation with air bronchograms suggesting pneumonia. 3. Enlarging bilateral adrenal gland metastasis. 4. Progressive osseous metastatic disease. 5. Stable radiation changes in the right upper lobe laterally. No obvious recurrent tumor in this location. Aortic Atherosclerosis (ICD10-I70.0) and Emphysema (ICD10-J43.9). Electronically Signed   By: Marijo Sanes M.D.   On: 04/18/2020 10:30   US Venous Img Lower Unilateral Right  Result Date: 04/18/2020 CLINICAL DATA:  69 year old male with a history swelling EXAM: RIGHT LOWER EXTREMITY VENOUS DOPPLER ULTRASOUND TECHNIQUE: Gray-scale sonography with graded compression, as well  as color Doppler and duplex ultrasound were performed to evaluate the lower extremity deep venous systems from the level of the common femoral vein and including the common femoral, femoral, profunda femoral, popliteal and calf veins including the posterior tibial, peroneal and gastrocnemius veins when visible. The superficial great saphenous vein was also interrogated. Spectral Doppler was utilized to evaluate flow at rest and with distal augmentation maneuvers in the common femoral, femoral and popliteal veins. COMPARISON:  None. FINDINGS: Contralateral Common Femoral Vein: Respiratory phasicity is normal and symmetric with the symptomatic side. No evidence of thrombus. Normal compressibility. Common Femoral Vein: No evidence of thrombus. Normal compressibility, respiratory phasicity and response to augmentation. Saphenofemoral Junction: No evidence of thrombus. Normal compressibility and flow on color Doppler imaging. Profunda Femoral Vein: No evidence of thrombus. Normal compressibility and flow on color Doppler imaging. Femoral Vein: No evidence of thrombus. Normal compressibility, respiratory phasicity and response to augmentation. Popliteal Vein: No evidence of thrombus. Normal compressibility, respiratory phasicity and response to  augmentation. Calf Veins: No evidence of thrombus. Normal compressibility and flow on color Doppler imaging. Superficial Great Saphenous Vein: No evidence of thrombus. Normal compressibility and flow on color Doppler imaging. Other Findings:  None. IMPRESSION: Sonographic survey of the right lower extremity negative for DVT Electronically Signed   By: Corrie Mckusick D.O.   On: 04/18/2020 09:55   DG Chest Port 1 View  Result Date: 04/18/2020 CLINICAL DATA:  ALTERED MENTAL STATUS EXAM: PORTABLE CHEST 1 VIEW COMPARISON:  12/14/2019; 06/25/2019; chest CT-03/03/2020 FINDINGS: Grossly unchanged enlarged cardiac silhouette and mediastinal contours post median sternotomy and CABG. Slight retraction of a left jugular approach port a catheter with tip now projected over the central aspect of the left innominate vein. The lungs remain hyperexpanded. Ill-defined nodular interstitial opacities involving the peripheral aspect of the right mid lung as well as the right lung base are unchanged. No new discrete focal airspace opacities. Persistent blunting of the right costophrenic angle without definite pleural effusion. Note is made of a crescentic lucency below the left hemidiaphragm. No acute osseous abnormalities. IMPRESSION: 1. Similar findings of cardiomegaly and known right greater than left pulmonary metastatic disease. 2. Crescentic lucency below the left hemidiaphragm may represent air within the stomach, however pneumoperitoneum could have a similar appearance. Correlation for abdominal symptoms is advised. Further evaluation with decubital abdominal radiographic series could be performed as indicated. 3. Continued retraction of left jugular approach port a catheter with tip now projected over the central aspect the left innominate vein. Critical Value/emergent results were called by telephone at the time of interpretation on 04/18/2020 at 8:23 am to provider PHILLIP STAFFORD , who verbally acknowledged these results.  Electronically Signed   By: Sandi Mariscal M.D.   On: 04/18/2020 08:22    SIGNED: Deatra Syris, MD, FACP, FHM. [facility],  Pager (please use [email or web address] to page/text)  If 7PM-7AM, please contact night-coverage [email or web address], Password [facility] 04/20/2020, 1:18 PM

## 2020-04-20 NOTE — Progress Notes (Signed)
PT Cancellation Note  Patient Details Name: Troy [name] MRN: [id number] DOB: 04-Jan-1951   Cancelled Treatment:    Reason Eval/Treat Not Completed: Other (comment).  Pt resting in bed upon PT arrival; family present and agreeable to therapist waking pt.  Pt drowsy initially upon waking; agreeable to getting to chair but pt then reporting needing to use bathroom but pt did not assist to get up and appearing drowsy; then pt refusing to get OOB.  Pt's sheets noted to be wet--NT notified who came to change pt's linens; pt appearing more alert after linen change but pt also reporting increased back pain (from logrolling in bed for linen change) and pt refusing to get up (NT, pt's nurse, and pt's family member report pt has been having a lot of pain with any movement/mobility).  Nurse updated regarding above attempted session.  Per chart, plan for home with hospice.  Will re-attempt PT evaluation at a later date/time as able/as appropriate.  Leitha Bleak, PT 04/20/20, 4:06 PM

## 2020-04-20 NOTE — Progress Notes (Signed)
Speech Language Pathology Treatment: Dysphagia  Patient Details Name: Troy [name] MRN: [id number] DOB: 1951/09/28 Today's Date: 04/20/2020 Time: 1235-1310 SLP Time Calculation (min) (ACUTE ONLY): 35 min  Assessment / Plan / Recommendation Clinical Impression  Pt seen for ongoing assessment of swallowing and education re: diet consistency - family present in room for such also. He appears improved in strength and alertness today; verbally responsive and feeding self Lunch meal sitting EOB. Pt is on [place] O2 support. Min fatigued w/ exertion of tasks per family member.   Pt consumed/fed self trials of thin liquids, purees(magic cup), and minced solids(mostly meat). No overt clinical s/s of aspiration were noted w/ any consistency; respiratory status remained calm and unlabored, vocal quality clear b/t trials. Pt followed general aspiration precautions for eating/drinking slowly. Oral phase appeared grossly [facility] for bolus management and timely A-P transfer for swallowing; oral clearing achieved w/ all consistencies. Pt is Edentulous so he required min extra time for gumming/mashing the meats. He denied any soreness of gums. Recommend continue a Dysphagia level 2 diet (MINCED diet) w/ gravies added to moisten foods and added Purees; Thin liquids. Recommend general aspiration precautions; Pills Whole in Puree for ease of swallowing; tray setup and positioning assistance for meals - out of bed is best. Discussed diet consistency and foods recommended for ease of mastication when not using Dentures; preparation options. Handouts given on food consistency and recommendations to Sister. ST services can be reconsult for any new needs while admitted. NSG updated. Precautions posted at bedside.      HPI HPI: Pt is a 69 y.o. 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  Per chart notes, As part of his staging work-up for cancer he was found to have right upper lobe nodule.  He ultimately underwent PET scan and was found to have hypermetabolic lesions in the lung, left adrenal, and right kidney.  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  Patient is status post concurrent chemoradiation.  Patient was admitted to the hospital on 04/18/2020 for sepsis likely from pneumonia.  Repeat CT of the chest, abdomen, and pelvis revealed progressive disease in the lung, skeleton, and adrenals.  Palliative Care/Hospice is following pt.       SLP Plan  All goals met       Recommendations  Diet recommendations: Dysphagia 2 (fine chop);Thin liquid;Dysphagia 1 (puree) Liquids provided via: Cup;Straw Medication Administration: Whole meds with puree (for ease of swallowing) Supervision: Patient able to self feed (setup support) Compensations: Minimize environmental distractions;Slow rate;Small sips/bites;Follow solids with liquid Postural Changes and/or Swallow Maneuvers: Seated upright 90 degrees;Upright 30-60 min after meal;Out of bed for meals                General recommendations:  (dietician f/u) Oral Care Recommendations: Oral care BID;Oral care before and after PO;Patient independent with oral care Follow up Recommendations: None SLP Visit Diagnosis: Dysphagia, oral phase (R13.11) (Edentulous; sore gums(min)) Plan: All goals met       GO                 Orinda Kenner, MS, CCC-SLP Troy [name] 04/20/2020, 4:29 PM

## 2020-04-20 NOTE — Telephone Encounter (Signed)
Daughter Dola Argyle requesting that Dr Janese Banks call to give her perspective on how Mr Musa is doing. Please return her call [phone]

## 2020-04-20 NOTE — Progress Notes (Signed)
Manufacturing engineer hospital Liaison note:  Follow up visit made to new referral for [facility] hospice services at home.  Family at bedside, daughter Daine Floras and sister in law. Writer was able to provide education to these family members. Questions answered. DME is being delivered today.  Will continue to follow thorough discharge. Flo Shanks BSN RN, [place] [phone]

## 2020-04-20 NOTE — Progress Notes (Signed)
Hematology/Oncology Consult note [facility]  [phone] [phone]  Patient Care Team: [name], MD as PCP - General (Internal Medicine) Troy Sine, MD as PCP - Cardiology (Cardiology)   Name of the patient: Troy [name]  [id number]  November 01, 1950   Date of visit:04/20/2020   Interval history- he is laying comfortably in bed. Did not open his eyes for me. On 2 L O2    Review of systems- unable to obtain. Patient sleeping and did not wake up for me    Allergies  Allergen Reactions  . Codeine Nausea And Vomiting and Other (See Comments)    Pt states when he takes codeine, he throws up blood  . Niacin And Related Other (See Comments)    "made his body feel hot & he had a hard time breathing"     Past Medical History:  Diagnosis Date  . Anxiety   . COPD (chronic obstructive pulmonary disease) ([place])   . Coronary artery disease 2006   Acute coronary syndrome in March 2006.   . Diabetes mellitus without complication ([place])   . Dyslipidemia   . Hypertension 10/09/2010   EF 40-45%  . Malignant neoplasm of upper lobe of right lung (HCC)    Chemo + rad tx's.   . 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 Myocardial infarction ([place])   . Renal cancer, right ([place]) 11/2019   Nephrectomy.  . Sleep apnea    uses cpap machine     Past Surgical History:  Procedure Laterality Date  . Arterial evalation      no evidence of ICA stenosis  . CARDIAC CATHETERIZATION    . CARDIAC SURGERY    . CORONARY ARTERY BYPASS GRAFT  2006   Post CABG surgery with a LIMA to the LAD, a vein to the diagonal ,,a vein to the the obtuse marginal and vein to the PDA.  [name] CORONARY STENT PLACEMENT  2010  . EAR CYST EXCISION Right 10/05/2018   Procedure: Right Partial pinnectomy;  Surgeon: Izora Gala, MD;  Location: [facility];  Service: ENT;  Laterality: Right;  . GRAFT(S) ANGIOGRAM  10/21/2011   Procedure: GRAFT(S) Cyril Loosen;  Surgeon: Leonie Man, MD;  Location: [facility] CATH LAB;  Service: Cardiovascular;;  . LEFT HEART CATHETERIZATION WITH CORONARY ANGIOGRAM N/A 10/21/2011   Procedure: LEFT HEART CATHETERIZATION WITH CORONARY ANGIOGRAM;  Surgeon: Leonie Man, MD;  Location: [facility] CATH LAB;  Service: Cardiovascular;  Laterality: N/A;  . LYMPH NODE BIOPSY Right 10/05/2018   Procedure: sentinel NODE BIOPSY with right partial modified neck dissection;  Surgeon: Izora Gala, MD;  Location: [facility];  Service: ENT;  Laterality: Right;  . PORTA CATH INSERTION N/A 07/12/2019   Procedure: PORTA CATH INSERTION;  Surgeon: Algernon Huxley, MD;  Location: [facility] CV LAB;  Service: Cardiovascular;  Laterality: N/A;  . ROBOT ASSISTED LAPAROSCOPIC NEPHRECTOMY Right 12/13/2019   Procedure: XI ROBOTIC ASSISTED LAPAROSCOPIC NEPHRECTOMY;  Surgeon: Hollice Espy, MD;  Location: [facility] ORS;  Service: Urology;  Laterality: Right;  . SURGERY OF LIP     SKIN CANCER    Social History   Socioeconomic History  . Marital status: Married    Spouse name: JoAnne Mory  . Number of children: Not on file  . Years of education: Not on file  . Highest education level: Not on file  Occupational History  . Not on file  Tobacco Use  . Smoking status: Current Every Day  Smoker    Packs/day: 2.00    Years: 50.00    Pack years: 100.00    Types: Cigarettes  . Smokeless tobacco: Former Systems developer    Types: Chew    Quit date: 01/23/2013  Vaping Use  . Vaping Use: Never used  Substance and Sexual Activity  . Alcohol use: No  . Drug use: Not Currently  . Sexual activity: Not Currently  Other Topics Concern  . Not on file  Social History Narrative   Lives with son and grandson   Social Determinants of Health   Financial Resource Strain:   . Difficulty of Paying Living Expenses:   Food Insecurity:   . Worried About Charity fundraiser in the Last Year:   . Arboriculturist in the Last Year:   Transportation Needs:   . Film/video editor (Medical):   [name] Lack  of Transportation (Non-Medical):   Physical Activity:   . Days of Exercise per Week:   . Minutes of Exercise per Session:   Stress:   . Feeling of Stress :   Social Connections:   . Frequency of Communication with Friends and Family:   . Frequency of Social Gatherings with Friends and Family:   . Attends Religious Services:   . Active Member of Clubs or Organizations:   . Attends Archivist Meetings:   [name] Marital Status:   Intimate Partner Violence:   . Fear of Current or Ex-Partner:   . Emotionally Abused:   [name] Physically Abused:   . Sexually Abused:     Family History  Problem Relation Age of Onset  . Hypertension Mother   . Heart attack Mother   . Heart attack Father 33     Current Facility-Administered Medications:  .  0.9 %  sodium chloride infusion, , Intravenous, PRN, Shahmehdi, Seyed A, MD, Last Rate: 10 mL/hr at 04/19/20 2224, 250 mL at 04/19/20 2224 .  acetaminophen (TYLENOL) tablet 650 mg, 650 mg, Oral, Q6H PRN, Oswald Hillock, RPH .  albuterol (PROVENTIL) (2.5 MG/3ML) 0.083% nebulizer solution 2.5 mg, 2.5 mg, Nebulization, Q4H PRN, Ivor Costa, MD .  ALPRAZolam Duanne Moron) tablet 0.25 mg, 0.25 mg, Oral, TID, Ivor Costa, MD, 0.25 mg at 04/20/20 1007 .  aspirin EC tablet 81 mg, 81 mg, Oral, Daily, Ivor Costa, MD, 81 mg at 04/20/20 1007 .  atorvastatin (LIPITOR) tablet 40 mg, 40 mg, Oral, QHS, Ivor Costa, MD, 40 mg at 04/19/20 2217 .  busPIRone (BUSPAR) tablet 7.5 mg, 7.5 mg, Oral, BID, Ivor Costa, MD, 7.5 mg at 04/20/20 1008 .  cyclobenzaprine (FLEXERIL) tablet 5 mg, 5 mg, Oral, TID PRN, Ivor Costa, MD, 5 mg at 04/19/20 2216 .  dextromethorphan-guaiFENesin (MUCINEX DM) 30-600 MG per 12 hr tablet 1 tablet, 1 tablet, Oral, BID, Ivor Costa, MD, 1 tablet at 04/20/20 1009 .  enoxaparin (LOVENOX) injection 40 mg, 40 mg, Subcutaneous, Q24H, Oswald Hillock, RPH, 40 mg at 04/19/20 2217 .  fluticasone furoate-vilanterol (BREO ELLIPTA) 100-25 MCG/INH 1 puff, 1 puff,  Inhalation, Daily, 1 puff at 04/20/20 1000 **AND** umeclidinium bromide (INCRUSE ELLIPTA) 62.5 MCG/INH 1 puff, 1 puff, Inhalation, Daily, Ivor Costa, MD, 1 puff at 04/20/20 1000 .  insulin aspart (novoLOG) injection 0-5 Units, 0-5 Units, Subcutaneous, QHS, Niu, Xilin, MD .  insulin aspart (novoLOG) injection 0-9 Units, 0-9 Units, Subcutaneous, TID WC, Ivor Costa, MD, 3 Units at 04/19/20 1838 .  ipratropium-albuterol (DUONEB) 0.5-2.5 (3) MG/3ML nebulizer solution 3 mL, 3 mL, Nebulization, Q6H, Shahmehdi,  Seyed A, MD, 3 mL at 04/20/20 1330 .  nicotine (NICODERM CQ - dosed in mg/24 hours) patch 21 mg, 21 mg, Transdermal, Daily, Ivor Costa, MD, 21 mg at 04/20/20 1030 .  ondansetron (ZOFRAN) injection 4 mg, 4 mg, Intravenous, Q8H PRN, Ivor Costa, MD .  oxyCODONE (Oxy IR/ROXICODONE) immediate release tablet 10 mg, 10 mg, Oral, Q4H PRN, Ivor Costa, MD, 10 mg at 04/20/20 1244 .  piperacillin-tazobactam (ZOSYN) IVPB 3.375 g, 3.375 g, Intravenous, Q8H, Oswald Hillock, RPH, Last Rate: 12.5 mL/hr at 04/20/20 1457, 3.375 g at 04/20/20 1457 .  ranolazine (RANEXA) 12 hr tablet 1,000 mg, 1,000 mg, Oral, BID, Ivor Costa, MD, 1,000 mg at 04/20/20 1009  Facility-Administered Medications Ordered in Other Encounters:  .  heparin lock flush 100 unit/mL, 500 Units, Intravenous, Once, Sindy Guadeloupe, MD .  sodium chloride flush (NS) 0.9 % injection 10 mL, 10 mL, Intravenous, PRN, Sindy Guadeloupe, MD .  sodium chloride flush (NS) 0.9 % injection 10 mL, 10 mL, Intravenous, PRN, Sindy Guadeloupe, MD, 10 mL at 08/31/19 1123  Physical exam:  Vitals:   04/20/20 0557 04/20/20 0725 04/20/20 0830 04/20/20 1334  BP: (!) 96/54 109/67    Pulse: 80 82 86 82  Resp:  17 (!) 22 20  Temp: 97.9 F (36.6 C) 98 F (36.7 C)    TempSrc: Oral Oral    SpO2: 97% 94% 92% 96%  Weight:      Height:       Physical Exam Constitutional:      General: He is not in acute distress. Cardiovascular:     Rate and Rhythm: Normal rate and regular  rhythm.     Heart sounds: Normal heart sounds.  Pulmonary:     Effort: Pulmonary effort is normal.     Comments: Breath sounds decreased b/l diffusely Abdominal:     General: Bowel sounds are normal.     Palpations: Abdomen is soft.  Skin:    General: Skin is warm and dry.      CMP Latest Ref Rng & Units 04/20/2020  Glucose 70 - 99 mg/dL 127(H)  BUN 8 - 23 mg/dL 42(H)  Creatinine 0.61 - 1.24 mg/dL 1.79(H)  Sodium 135 - 145 mmol/L 138  Potassium 3.5 - 5.1 mmol/L 4.5  Chloride 98 - 111 mmol/L 102  CO2 22 - 32 mmol/L 26  Calcium 8.9 - 10.3 mg/dL 8.4(L)  Total Protein 6.5 - 8.1 g/dL -  Total Bilirubin 0.3 - 1.2 mg/dL -  Alkaline Phos 38 - 126 U/L -  AST 15 - 41 U/L -  ALT 0 - 44 U/L -   CBC Latest Ref Rng & Units 04/20/2020  WBC 4.0 - 10.5 K/uL 12.8(H)  Hemoglobin 13.0 - 17.0 g/dL 9.1(L)  Hematocrit 39 - 52 % 27.8(L)  Platelets 150 - 400 K/uL 211    @IMAGES @  CT ABDOMEN PELVIS WO CONTRAST  Result Date: 04/18/2020 CLINICAL DATA:  Shortness of breath, altered mental status and not eating or drinking. 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 EXAM: CT CHEST, ABDOMEN AND PELVIS WITHOUT CONTRAST TECHNIQUE: Multidetector CT imaging of the chest, abdomen and pelvis was performed following the standard protocol without IV contrast. COMPARISON:  CT scan 03/03/2020 FINDINGS: CT CHEST FINDINGS Cardiovascular: The heart is normal in size. No pericardial effusion. Stable tortuosity and extensive calcification of the thoracic aorta and coronary arteries. Surgical changes from coronary artery bypass surgery. Mediastinum/Nodes: Stable scattered sub 8 mm mediastinal and hilar  lymph nodes. No supraclavicular or axillary adenopathy. Lungs/Pleura: Stable advanced emphysematous changes and pulmonary scarring. Progressive pulmonary metastatic disease. Stable radiation changes in the right upper lobe laterally. No obvious recurrent tumor in this location. Right lower lobe airspace  consolidation with air bronchograms suggesting pneumonia. The left lung is clear of an acute process. Musculoskeletal: New lytic bone lesion involving the right scapula is noted. There is also a progressive destructive lytic bone lesion involving the left scapula. No obvious sternal or thoracic vertebral body lesions. No obvious rib lesions. CT ABDOMEN PELVIS FINDINGS Hepatobiliary: No obvious hepatic lesions are identified without contrast. Layering high attenuation material in the gallbladder is likely sludge. No common bile duct dilatation. Pancreas: No pancreatic mass, inflammation or ductal dilatation. Spleen: Normal size.  No focal lesions. Adrenals/Urinary Tract: Enlarging bilateral adrenal gland metastasis. Status post right nephrectomy. Stable left renal cysts. The bladder is unremarkable. Stomach/Bowel: The stomach, duodenum, small bowel and colon are grossly normal without contrast. Vascular/Lymphatic: Stable advanced vascular calcifications. No mesenteric or retroperitoneal mass or adenopathy. Reproductive: The prostate gland and seminal vesicles are unremarkable Other: No pelvic mass or adenopathy. No free pelvic fluid collections. No inguinal mass or adenopathy. No abdominal wall hernia or subcutaneous lesions. Musculoskeletal: Progressive destructive bone lesion involving the left ischium. Progressive destructive lesion involving the left ilium anteriorly. Slight progression of L3 lytic metastasis. IMPRESSION: 1. Progressive pulmonary metastatic disease. 2. Right lower lobe airspace consolidation with air bronchograms suggesting pneumonia. 3. Enlarging bilateral adrenal gland metastasis. 4. Progressive osseous metastatic disease. 5. Stable radiation changes in the right upper lobe laterally. No obvious recurrent tumor in this location. Aortic Atherosclerosis (ICD10-I70.0) and Emphysema (ICD10-J43.9). Electronically Signed   By: Marijo Sanes M.D.   On: 04/18/2020 10:30   CT Chest Wo  Contrast  Result Date: 04/18/2020 CLINICAL DATA:  Shortness of breath, altered mental status and not eating or drinking. 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 EXAM: CT CHEST, ABDOMEN AND PELVIS WITHOUT CONTRAST TECHNIQUE: Multidetector CT imaging of the chest, abdomen and pelvis was performed following the standard protocol without IV contrast. COMPARISON:  CT scan 03/03/2020 FINDINGS: CT CHEST FINDINGS Cardiovascular: The heart is normal in size. No pericardial effusion. Stable tortuosity and extensive calcification of the thoracic aorta and coronary arteries. Surgical changes from coronary artery bypass surgery. Mediastinum/Nodes: Stable scattered sub 8 mm mediastinal and hilar lymph nodes. No supraclavicular or axillary adenopathy. Lungs/Pleura: Stable advanced emphysematous changes and pulmonary scarring. Progressive pulmonary metastatic disease. Stable radiation changes in the right upper lobe laterally. No obvious recurrent tumor in this location. Right lower lobe airspace consolidation with air bronchograms suggesting pneumonia. The left lung is clear of an acute process. Musculoskeletal: New lytic bone lesion involving the right scapula is noted. There is also a progressive destructive lytic bone lesion involving the left scapula. No obvious sternal or thoracic vertebral body lesions. No obvious rib lesions. CT ABDOMEN PELVIS FINDINGS Hepatobiliary: No obvious hepatic lesions are identified without contrast. Layering high attenuation material in the gallbladder is likely sludge. No common bile duct dilatation. Pancreas: No pancreatic mass, inflammation or ductal dilatation. Spleen: Normal size.  No focal lesions. Adrenals/Urinary Tract: Enlarging bilateral adrenal gland metastasis. Status post right nephrectomy. Stable left renal cysts. The bladder is unremarkable. Stomach/Bowel: The stomach, duodenum, small bowel and colon are grossly normal without contrast. Vascular/Lymphatic:  Stable advanced vascular calcifications. No mesenteric or retroperitoneal mass or adenopathy. Reproductive: The prostate gland and seminal vesicles are unremarkable Other: No pelvic mass or adenopathy. No  free pelvic fluid collections. No inguinal mass or adenopathy. No abdominal wall hernia or subcutaneous lesions. Musculoskeletal: Progressive destructive bone lesion involving the left ischium. Progressive destructive lesion involving the left ilium anteriorly. Slight progression of L3 lytic metastasis. IMPRESSION: 1. Progressive pulmonary metastatic disease. 2. Right lower lobe airspace consolidation with air bronchograms suggesting pneumonia. 3. Enlarging bilateral adrenal gland metastasis. 4. Progressive osseous metastatic disease. 5. Stable radiation changes in the right upper lobe laterally. No obvious recurrent tumor in this location. Aortic Atherosclerosis (ICD10-I70.0) and Emphysema (ICD10-J43.9). Electronically Signed   By: Marijo Sanes M.D.   On: 04/18/2020 10:30   US Venous Img Lower Unilateral Right  Result Date: 04/18/2020 CLINICAL DATA:  69 year old male with a history swelling EXAM: RIGHT LOWER EXTREMITY VENOUS DOPPLER ULTRASOUND TECHNIQUE: Gray-scale sonography with graded compression, as well as color Doppler and duplex ultrasound were performed to evaluate the lower extremity deep venous systems from the level of the common femoral vein and including the common femoral, femoral, profunda femoral, popliteal and calf veins including the posterior tibial, peroneal and gastrocnemius veins when visible. The superficial great saphenous vein was also interrogated. Spectral Doppler was utilized to evaluate flow at rest and with distal augmentation maneuvers in the common femoral, femoral and popliteal veins. COMPARISON:  None. FINDINGS: Contralateral Common Femoral Vein: Respiratory phasicity is normal and symmetric with the symptomatic side. No evidence of thrombus. Normal compressibility. Common  Femoral Vein: No evidence of thrombus. Normal compressibility, respiratory phasicity and response to augmentation. Saphenofemoral Junction: No evidence of thrombus. Normal compressibility and flow on color Doppler imaging. Profunda Femoral Vein: No evidence of thrombus. Normal compressibility and flow on color Doppler imaging. Femoral Vein: No evidence of thrombus. Normal compressibility, respiratory phasicity and response to augmentation. Popliteal Vein: No evidence of thrombus. Normal compressibility, respiratory phasicity and response to augmentation. Calf Veins: No evidence of thrombus. Normal compressibility and flow on color Doppler imaging. Superficial Great Saphenous Vein: No evidence of thrombus. Normal compressibility and flow on color Doppler imaging. Other Findings:  None. IMPRESSION: Sonographic survey of the right lower extremity negative for DVT Electronically Signed   By: Corrie Mckusick D.O.   On: 04/18/2020 09:55   DG Chest Port 1 View  Result Date: 04/18/2020 CLINICAL DATA:  ALTERED MENTAL STATUS EXAM: PORTABLE CHEST 1 VIEW COMPARISON:  12/14/2019; 06/25/2019; chest CT-03/03/2020 FINDINGS: Grossly unchanged enlarged cardiac silhouette and mediastinal contours post median sternotomy and CABG. Slight retraction of a left jugular approach port a catheter with tip now projected over the central aspect of the left innominate vein. The lungs remain hyperexpanded. Ill-defined nodular interstitial opacities involving the peripheral aspect of the right mid lung as well as the right lung base are unchanged. No new discrete focal airspace opacities. Persistent blunting of the right costophrenic angle without definite pleural effusion. Note is made of a crescentic lucency below the left hemidiaphragm. No acute osseous abnormalities. IMPRESSION: 1. Similar findings of cardiomegaly and known right greater than left pulmonary metastatic disease. 2. Crescentic lucency below the left hemidiaphragm may represent  air within the stomach, however pneumoperitoneum could have a similar appearance. Correlation for abdominal symptoms is advised. Further evaluation with decubital abdominal radiographic series could be performed as indicated. 3. Continued retraction of left jugular approach port a catheter with tip now projected over the central aspect the left innominate vein. Critical Value/emergent results were called by telephone at the time of interpretation on 04/18/2020 at 8:23 am to provider PHILLIP STAFFORD , who verbally acknowledged these results. Electronically Signed  By: Sandi Mariscal M.D.   On: 04/18/2020 08:22     Assessment and plan- Patient is a 69 y.o. 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  Scans do show progressive disease with progressive pulmonary, bilateral adrenal gland as well as bone metastases.  His performance status is overall poor and therefore further biopsies and treatment options are not being considered at this time.  His overall prognosis is likely in weeks- <3 months.  Appreciate input by palliative care  Back pain: Continue as needed oxycodone.  He will need a long-acting pain medicine that would be covered by hospice probably MS Contin or fentanyl patch.  I have updated patients daughter Dola Argyle and his wife   Visit Diagnosis 1. Pneumonia of right lower lobe due to infectious organism   2. Septic shock ([place])   3. Malignant neoplasm of lung, unspecified laterality, unspecified part of lung ([place])   4. Type 2 diabetes mellitus without complication, without long-term current use of insulin (HCC)      Dr. Randa Evens, MD, MPH [facility] [phone] 04/20/2020 4:55 PM

## 2020-04-20 NOTE — Progress Notes (Signed)
[place]  Telephone:([phone] [phone]   Name: Troy [name] Date: 04/20/2020 MRN: [id number]  DOB: May 14, 1951  Patient Care Team: [name], MD as PCP - General (Internal Medicine) Troy Sine, MD as PCP - Cardiology (Cardiology)    REASON FOR CONSULTATION: Troy [name] is a 69 y.o. 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  As part of his staging work-up for cancer he was found to have right upper lobe nodule.  He ultimately underwent PET scan and was found to have hypermetabolic lesions in the lung, left adrenal, and right kidney.  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  Patient is status post concurrent chemoradiation.  Patient was admitted to the hospital on 04/18/2020 for sepsis likely from pneumonia.  Repeat CT of the chest, abdomen, and pelvis revealed progressive disease in the lung, skeleton, and adrenals.  He is felt to likely be a poor candidate for further systemic treatment.  Palliative care was consulted to help address goals.  CODE STATUS: DNR  PAST MEDICAL HISTORY: Past Medical History:  Diagnosis Date  . Anxiety   . COPD (chronic obstructive pulmonary disease) ([place])   . Coronary artery disease 2006   Acute coronary syndrome in March 2006.   . Diabetes mellitus without complication ([place])   . Dyslipidemia   . Hypertension 10/09/2010   EF 40-45%  . Malignant neoplasm of upper lobe of right lung (H[facility])    Chemo + rad tx's.   . 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 Myocardial infarction ([place])   . Renal cancer, right ([place]) 11/2019   Nephrectomy.  . Sleep apnea    uses cpap machine    PAST SURGICAL HISTORY:  Past Surgical History:  Procedure Laterality Date  . Arterial  evalation      no evidence of ICA stenosis  . CARDIAC CATHETERIZATION    . CARDIAC SURGERY    . CORONARY ARTERY BYPASS GRAFT  2006   Post CABG surgery with a LIMA to the LAD, a vein to the diagonal ,,a vein to the the obtuse marginal and vein to the PDA.  [name] CORONARY STENT PLACEMENT  2010  . EAR CYST EXCISION Right 10/05/2018   Procedure: Right Partial pinnectomy;  Surgeon: Izora Gala, MD;  Location: [place];  Service: ENT;  Laterality: Right;  . GRAFT(S) ANGIOGRAM  10/21/2011   Procedure: GRAFT(S) Cyril Loosen;  Surgeon: Leonie Man, MD;  Location: [facility] CATH LAB;  Service: Cardiovascular;;  . LEFT HEART CATHETERIZATION WITH CORONARY ANGIOGRAM N/A 10/21/2011   Procedure: LEFT HEART CATHETERIZATION WITH CORONARY ANGIOGRAM;  Surgeon: Leonie Man, MD;  Location: [facility] CATH LAB;  Service: Cardiovascular;  Laterality: N/A;  . LYMPH NODE BIOPSY Right 10/05/2018   Procedure: sentinel NODE BIOPSY with right partial modified neck dissection;  Surgeon: Izora Gala, MD;  Location: [place];  Service: ENT;  Laterality: Right;  . PORTA CATH INSERTION N/A 07/12/2019   Procedure: PORTA CATH INSERTION;  Surgeon: Algernon Huxley, MD;  Location: [place] CV LAB;  Service: Cardiovascular;  Laterality: N/A;  . ROBOT ASSISTED LAPAROSCOPIC NEPHRECTOMY Right 12/13/2019   Procedure: XI ROBOTIC ASSISTED LAPAROSCOPIC NEPHRECTOMY;  Surgeon: Hollice Espy, MD;  Location: [facility] ORS;  Service: Urology;  Laterality: Right;  . 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 lung ([place])   07/20/2019 -  Chemotherapy   The patient had palonosetron (ALOXI) injection 0.25 mg, 0.25 mg, Intravenous,  Once,  4 of 4 cycles Administration: 0.25 mg (07/20/2019), 0.25 mg (08/10/2019), 0.25 mg (08/31/2019), 0.25 mg (09/28/2019) pegfilgrastim (NEULASTA ONPRO KIT) injection 6 mg, 6 mg, Subcutaneous, Once, 1 of 1 cycle Administration: 6 mg (09/02/2019) CARBOplatin (PARAPLATIN) 440 mg in sodium chloride 0.9 % 250 mL chemo infusion, 440 mg (100 % of original dose 439.5 mg), Intravenous,  Once, 4 of 4 cycles Dose modification:   (original dose 439.5 mg, Cycle 1) Administration: 440 mg (07/20/2019), 440 mg (08/10/2019), 350 mg (08/31/2019), 400 mg (09/28/2019) etoposide (VEPESID) 200 mg in sodium chloride 0.9 % 500 mL chemo infusion, 100 mg/m2 = 200 mg, Intravenous,  Once, 4 of 4 cycles Administration: 200 mg (07/20/2019), 200 mg (07/21/2019), 200 mg (09/02/2019), 200 mg (08/10/2019), 200 mg (07/22/2019), 200 mg (08/11/2019), 200 mg (08/12/2019), 200 mg (09/01/2019), 200 mg (08/31/2019), 200 mg (09/28/2019), 200 mg (09/29/2019) fosaprepitant (EMEND) 150 mg, dexamethasone (DECADRON) 12 mg in sodium chloride 0.9 % 145 mL IVPB, , Intravenous,  Once, 4 of 4 cycles Administration:  (07/20/2019),  (08/10/2019),  (08/31/2019),  (09/28/2019)  for chemotherapy treatment.      ALLERGIES:  is allergic to codeine and niacin and related.  MEDICATIONS:  Current Facility-Administered Medications  Medication Dose Route Frequency Provider Last Rate Last Admin  . 0.9 %  sodium chloride infusion   Intravenous PRN Skipper Cliche A, MD 10 mL/hr at 04/19/20 2224 250 mL at 04/19/20 2224  . acetaminophen (TYLENOL) tablet 650 mg  650 mg Oral Q6H PRN Oswald Hillock, RPH      . albuterol (PROVENTIL) (2.5 MG/3ML) 0.083% nebulizer solution 2.5 mg  2.5 mg Nebulization Q4H PRN Ivor Costa, MD      . ALPRAZolam Duanne Moron) tablet 0.25 mg  0.25 mg Oral TID Ivor Costa, MD   0.25 mg at 04/20/20 1007  . aspirin EC tablet 81 mg  81 mg Oral Daily Ivor Costa, MD   81 mg at 04/20/20 1007  . atorvastatin (LIPITOR) tablet 40 mg  40 mg Oral QHS Ivor Costa,  MD   40 mg at 04/19/20 2217  . busPIRone (BUSPAR) tablet 7.5 mg  7.5 mg Oral BID Ivor Costa, MD   7.5 mg at 04/20/20 1008  . cyclobenzaprine (FLEXERIL) tablet 5 mg  5 mg Oral TID PRN Ivor Costa, MD   5 mg at 04/19/20 2216  . dextromethorphan-guaiFENesin (MUCINEX DM) 30-600 MG per 12 hr tablet 1 tablet  1 tablet Oral BID Ivor Costa, MD   1 tablet at 04/20/20 1009  . enoxaparin (LOVENOX) injection 40 mg  40 mg Subcutaneous Q24H Oswald Hillock, RPH   40 mg at 04/19/20 2217  . fluticasone furoate-vilanterol (BREO ELLIPTA) 100-25 MCG/INH 1 puff  1 puff Inhalation Daily Ivor Costa, MD   1 puff at 04/19/20 0934   And  . umeclidinium bromide (INCRUSE ELLIPTA) 62.5 MCG/INH 1 puff  1 puff Inhalation Daily Ivor Costa, MD   1 puff at 04/19/20 0934  . insulin aspart (novoLOG) injection 0-5 Units  0-5 Units Subcutaneous QHS Ivor Costa, MD      . insulin aspart (novoLOG) injection 0-9 Units  0-9 Units Subcutaneous TID WC Ivor Costa, MD   3 Units at 04/19/20 1838  . ipratropium-albuterol (DUONEB) 0.5-2.5 (3) MG/3ML nebulizer solution 3 mL  3 mL Nebulization Q6H Shahmehdi, Seyed A, MD   3 mL at 04/20/20 1330  . nicotine (NICODERM CQ - dosed in mg/24 hours) patch 21 mg  21 mg Transdermal Daily Ivor Costa, MD   21 mg at 04/19/20 1435  . ondansetron (ZOFRAN) injection 4 mg  4 mg Intravenous Q8H PRN Ivor Costa, MD      . oxyCODONE (Oxy IR/ROXICODONE) immediate release tablet 10 mg  10 mg Oral Q4H PRN Ivor Costa, MD   10 mg at 04/20/20 1244  . piperacillin-tazobactam (ZOSYN) IVPB 3.375 g  3.375 g Intravenous Q8H Oswald Hillock, RPH 12.5 mL/hr at 04/20/20 0556 3.375 g at 04/20/20 0556  . ranolazine (RANEXA) 12 hr tablet 1,000 mg  1,000 mg Oral BID Ivor Costa, MD   1,000 mg at 04/20/20 1009   Facility-Administered Medications Ordered in Other Encounters  Medication Dose Route Frequency Provider Last Rate Last Admin  . heparin lock flush 100 unit/mL  500 Units Intravenous Once Sindy Guadeloupe, MD      . sodium chloride  flush (NS) 0.9 % injection 10 mL  10 mL Intravenous PRN Sindy Guadeloupe, MD      . sodium chloride flush (NS) 0.9 % injection 10 mL  10 mL Intravenous PRN Sindy Guadeloupe, MD   10 mL at 08/31/19 1123    VITAL SIGNS: BP 109/67 (BP Location: Right Arm)   Pulse 82   Temp 98 F (36.7 C) (Oral)   Resp 20   Ht _0  (1.727 m)   Wt 168 lb 11.2 oz (76.5 kg)   SpO2 96%   BMI 25.65 kg/m  Filed Weights   04/18/20 0754 04/19/20 0226  Weight: 168 lb 12.8 oz (76.6 kg) 168 lb 11.2 oz (76.5 kg)    Estimated body mass index is 25.65 kg/m as calculated from the following:   Height as of this encounter: _1  (1.727 m).   Weight as of this encounter: 168 lb 11.2 oz (76.5 kg).  LABS: CBC:    Component Value Date/Time   WBC 12.8 (H) 04/20/2020 0514   HGB 9.1 (L) 04/20/2020 0514   HGB 10.9 (L) 11/15/2019 0827   HCT 27.8 (L) 04/20/2020 0514   HCT 34.5 (L) 11/15/2019 0827   PLT 211 04/20/2020 0514   PLT 282 11/15/2019 0827   MCV 93.9 04/20/2020 0514   MCV 97 11/15/2019 0827   MCV 93 07/13/2014 1331   NEUTROABS 10.6 (H) 04/18/2020 0804   LYMPHSABS 1.0 04/18/2020 0804   MONOABS 1.8 (H) 04/18/2020 0804   EOSABS 0.0 04/18/2020 0804   BASOSABS 0.1 04/18/2020 0804   Comprehensive Metabolic Panel:    Component Value Date/Time   NA 138 04/20/2020 0514   NA 142 11/15/2019 0827   NA 139 07/13/2014 1331   K 4.5 04/20/2020 0514   K 4.1 07/13/2014 1331   CL 102 04/20/2020 0514   CL 107 07/13/2014 1331   CO2 26 04/20/2020 0514   CO2 26 07/13/2014 1331   BUN 42 (H) 04/20/2020 0514   BUN 9 11/15/2019 0827   BUN 16 07/13/2014 1331   CREATININE 1.79 (H) 04/20/2020 0514   CREATININE 1.32 (H) 01/03/2017 1303   GLUCOSE 127 (H) 04/20/2020  5449   GLUCOSE 102 (H) 07/13/2014 1331   CALCIUM 8.4 (L) 04/20/2020 0514   CALCIUM 8.7 07/13/2014 1331   AST 56 (H) 04/18/2020 0804   AST 14 (L) 07/13/2014 1331   ALT 17 04/18/2020 0804   ALT 23 07/13/2014 1331   ALKPHOS 85 04/18/2020 0804   ALKPHOS 78  07/13/2014 1331   BILITOT 1.1 04/18/2020 0804   BILITOT 0.4 11/15/2019 0827   BILITOT 0.8 07/13/2014 1331   PROT 6.7 04/18/2020 0804   PROT 6.7 11/15/2019 0827   PROT 7.3 07/13/2014 1331   ALBUMIN 3.2 (L) 04/18/2020 0804   ALBUMIN 3.7 (L) 11/15/2019 0827   ALBUMIN 3.5 07/13/2014 1331    RADIOGRAPHIC STUDIES: CT ABDOMEN PELVIS WO CONTRAST  Result Date: 04/18/2020 CLINICAL DATA:  Shortness of breath, altered mental status and not eating or drinking. 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 EXAM: CT CHEST, ABDOMEN AND PELVIS WITHOUT CONTRAST TECHNIQUE: Multidetector CT imaging of the chest, abdomen and pelvis was performed following the standard protocol without IV contrast. COMPARISON:  CT scan 03/03/2020 FINDINGS: CT CHEST FINDINGS Cardiovascular: The heart is normal in size. No pericardial effusion. Stable tortuosity and extensive calcification of the thoracic aorta and coronary arteries. Surgical changes from coronary artery bypass surgery. Mediastinum/Nodes: Stable scattered sub 8 mm mediastinal and hilar lymph nodes. No supraclavicular or axillary adenopathy. Lungs/Pleura: Stable advanced emphysematous changes and pulmonary scarring. Progressive pulmonary metastatic disease. Stable radiation changes in the right upper lobe laterally. No obvious recurrent tumor in this location. Right lower lobe airspace consolidation with air bronchograms suggesting pneumonia. The left lung is clear of an acute process. Musculoskeletal: New lytic bone lesion involving the right scapula is noted. There is also a progressive destructive lytic bone lesion involving the left scapula. No obvious sternal or thoracic vertebral body lesions. No obvious rib lesions. CT ABDOMEN PELVIS FINDINGS Hepatobiliary: No obvious hepatic lesions are identified without contrast. Layering high attenuation material in the gallbladder is likely sludge. No common bile duct dilatation. Pancreas: No pancreatic mass,  inflammation or ductal dilatation. Spleen: Normal size.  No focal lesions. Adrenals/Urinary Tract: Enlarging bilateral adrenal gland metastasis. Status post right nephrectomy. Stable left renal cysts. The bladder is unremarkable. Stomach/Bowel: The stomach, duodenum, small bowel and colon are grossly normal without contrast. Vascular/Lymphatic: Stable advanced vascular calcifications. No mesenteric or retroperitoneal mass or adenopathy. Reproductive: The prostate gland and seminal vesicles are unremarkable Other: No pelvic mass or adenopathy. No free pelvic fluid collections. No inguinal mass or adenopathy. No abdominal wall hernia or subcutaneous lesions. Musculoskeletal: Progressive destructive bone lesion involving the left ischium. Progressive destructive lesion involving the left ilium anteriorly. Slight progression of L3 lytic metastasis. IMPRESSION: 1. Progressive pulmonary metastatic disease. 2. Right lower lobe airspace consolidation with air bronchograms suggesting pneumonia. 3. Enlarging bilateral adrenal gland metastasis. 4. Progressive osseous metastatic disease. 5. Stable radiation changes in the right upper lobe laterally. No obvious recurrent tumor in this location. Aortic Atherosclerosis (ICD10-I70.0) and Emphysema (ICD10-J43.9). Electronically Signed   By: Marijo Sanes M.D.   On: 04/18/2020 10:30   CT Chest Wo Contrast  Result Date: 04/18/2020 CLINICAL DATA:  Shortness of breath, altered mental status and not eating or drinking. 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 EXAM: CT CHEST, ABDOMEN AND PELVIS WITHOUT CONTRAST TECHNIQUE: Multidetector CT imaging of the chest, abdomen and pelvis was performed following the standard protocol without IV contrast. COMPARISON:  CT scan 03/03/2020 FINDINGS: CT CHEST FINDINGS Cardiovascular: The heart is normal in  size. No pericardial effusion. Stable tortuosity and extensive calcification of the thoracic aorta and coronary arteries.  Surgical changes from coronary artery bypass surgery. Mediastinum/Nodes: Stable scattered sub 8 mm mediastinal and hilar lymph nodes. No supraclavicular or axillary adenopathy. Lungs/Pleura: Stable advanced emphysematous changes and pulmonary scarring. Progressive pulmonary metastatic disease. Stable radiation changes in the right upper lobe laterally. No obvious recurrent tumor in this location. Right lower lobe airspace consolidation with air bronchograms suggesting pneumonia. The left lung is clear of an acute process. Musculoskeletal: New lytic bone lesion involving the right scapula is noted. There is also a progressive destructive lytic bone lesion involving the left scapula. No obvious sternal or thoracic vertebral body lesions. No obvious rib lesions. CT ABDOMEN PELVIS FINDINGS Hepatobiliary: No obvious hepatic lesions are identified without contrast. Layering high attenuation material in the gallbladder is likely sludge. No common bile duct dilatation. Pancreas: No pancreatic mass, inflammation or ductal dilatation. Spleen: Normal size.  No focal lesions. Adrenals/Urinary Tract: Enlarging bilateral adrenal gland metastasis. Status post right nephrectomy. Stable left renal cysts. The bladder is unremarkable. Stomach/Bowel: The stomach, duodenum, small bowel and colon are grossly normal without contrast. Vascular/Lymphatic: Stable advanced vascular calcifications. No mesenteric or retroperitoneal mass or adenopathy. Reproductive: The prostate gland and seminal vesicles are unremarkable Other: No pelvic mass or adenopathy. No free pelvic fluid collections. No inguinal mass or adenopathy. No abdominal wall hernia or subcutaneous lesions. Musculoskeletal: Progressive destructive bone lesion involving the left ischium. Progressive destructive lesion involving the left ilium anteriorly. Slight progression of L3 lytic metastasis. IMPRESSION: 1. Progressive pulmonary metastatic disease. 2. Right lower lobe airspace  consolidation with air bronchograms suggesting pneumonia. 3. Enlarging bilateral adrenal gland metastasis. 4. Progressive osseous metastatic disease. 5. Stable radiation changes in the right upper lobe laterally. No obvious recurrent tumor in this location. Aortic Atherosclerosis (ICD10-I70.0) and Emphysema (ICD10-J43.9). Electronically Signed   By: Marijo Sanes M.D.   On: 04/18/2020 10:30   US Venous Img Lower Unilateral Right  Result Date: 04/18/2020 CLINICAL DATA:  69 year old male with a history swelling EXAM: RIGHT LOWER EXTREMITY VENOUS DOPPLER ULTRASOUND TECHNIQUE: Gray-scale sonography with graded compression, as well as color Doppler and duplex ultrasound were performed to evaluate the lower extremity deep venous systems from the level of the common femoral vein and including the common femoral, femoral, profunda femoral, popliteal and calf veins including the posterior tibial, peroneal and gastrocnemius veins when visible. The superficial great saphenous vein was also interrogated. Spectral Doppler was utilized to evaluate flow at rest and with distal augmentation maneuvers in the common femoral, femoral and popliteal veins. COMPARISON:  None. FINDINGS: Contralateral Common Femoral Vein: Respiratory phasicity is normal and symmetric with the symptomatic side. No evidence of thrombus. Normal compressibility. Common Femoral Vein: No evidence of thrombus. Normal compressibility, respiratory phasicity and response to augmentation. Saphenofemoral Junction: No evidence of thrombus. Normal compressibility and flow on color Doppler imaging. Profunda Femoral Vein: No evidence of thrombus. Normal compressibility and flow on color Doppler imaging. Femoral Vein: No evidence of thrombus. Normal compressibility, respiratory phasicity and response to augmentation. Popliteal Vein: No evidence of thrombus. Normal compressibility, respiratory phasicity and response to augmentation. Calf Veins: No evidence of thrombus.  Normal compressibility and flow on color Doppler imaging. Superficial Great Saphenous Vein: No evidence of thrombus. Normal compressibility and flow on color Doppler imaging. Other Findings:  None. IMPRESSION: Sonographic survey of the right lower extremity negative for DVT Electronically Signed   By: Corrie Mckusick D.O.   On: 04/18/2020 09:55  DG Chest Port 1 View  Result Date: 04/18/2020 CLINICAL DATA:  ALTERED MENTAL STATUS EXAM: PORTABLE CHEST 1 VIEW COMPARISON:  12/14/2019; 06/25/2019; chest CT-03/03/2020 FINDINGS: Grossly unchanged enlarged cardiac silhouette and mediastinal contours post median sternotomy and CABG. Slight retraction of a left jugular approach port a catheter with tip now projected over the central aspect of the left innominate vein. The lungs remain hyperexpanded. Ill-defined nodular interstitial opacities involving the peripheral aspect of the right mid lung as well as the right lung base are unchanged. No new discrete focal airspace opacities. Persistent blunting of the right costophrenic angle without definite pleural effusion. Note is made of a crescentic lucency below the left hemidiaphragm. No acute osseous abnormalities. IMPRESSION: 1. Similar findings of cardiomegaly and known right greater than left pulmonary metastatic disease. 2. Crescentic lucency below the left hemidiaphragm may represent air within the stomach, however pneumoperitoneum could have a similar appearance. Correlation for abdominal symptoms is advised. Further evaluation with decubital abdominal radiographic series could be performed as indicated. 3. Continued retraction of left jugular approach port a catheter with tip now projected over the central aspect the left innominate vein. Critical Value/emergent results were called by telephone at the time of interpretation on 04/18/2020 at 8:23 am to provider PHILLIP STAFFORD , who verbally acknowledged these results. Electronically Signed   By: Sandi Mariscal M.D.   On:  04/18/2020 08:22    PERFORMANCE STATUS (ECOG) : 3 - Symptomatic, >50% confined to bed  Review of Systems Unless otherwise noted, a complete review of systems is negative.  Physical Exam General: NAD, frail appearing Pulmonary: unlabored Extremities: no edema, no joint deformities Skin: no rashes Neurological: Weakness but otherwise nonfocal  IMPRESSION: Patient is more alert and engaging today.  He is sitting at edge of bed.  Patient denies significant changes or concerns.  Discussed care with patient's daughters and hospice liaison.  Plan is for home with hospice when medically ready.  Symptomatically, he continues to endorse intermittent back pain, which is exacerbated with movement.  Patient says pain is better or relieved when he is still.  He is taking infrequent dosing of oxycodone (twice in past 24 hours).  I note that prior to this hospitalization he was started on Xtampza ER.  This is not covered by the hospice formulary and he would have to be rotated to another long-acting opioid if needed.  Right now, he does not seem to be using enough short acting oxycodone to justify initiation of a long-acting medication.  I did encourage him to utilize oxycodone as needed.  Could consider starting transdermal fentanyl or MS Contin if needed.  This could be done in the outpatient setting.  PLAN: -Best supportive care -Home with hospice when medically ready -Continue use of oxycodone as needed for pain   Patient expressed understanding and was in agreement with this plan. He also understands that He can call the clinic at any time with any questions, concerns, or complaints.     Time Total: 25 minutes  Visit consisted of counseling and education dealing with the complex and emotionally intense issues of symptom management and palliative care in the setting of serious and potentially life-threatening illness.Greater than 50%  of this time was spent counseling and coordinating care  related to the above assessment and plan.  Signed by: Altha Harm, PhD, NP-C

## 2020-04-20 NOTE — Telephone Encounter (Signed)
done

## 2020-04-21 ENCOUNTER — Ambulatory Visit: Payer: Medicare Other

## 2020-04-21 LAB — BASIC METABOLIC PANEL
Anion gap: 6 (ref 5–15)
BUN: 29 mg/dL — ABNORMAL HIGH (ref 8–23)
CO2: 28 mmol/L (ref 22–32)
Calcium: 8.5 mg/dL — ABNORMAL LOW (ref 8.9–10.3)
Chloride: 105 mmol/L (ref 98–111)
Creatinine, Ser: 1.47 mg/dL — ABNORMAL HIGH (ref 0.61–1.24)
GFR calc Af Amer: 55 mL/min — ABNORMAL LOW (ref >60)
GFR calc non Af Amer: 47 mL/min — ABNORMAL LOW (ref >60)
Glucose, Bld: 120 mg/dL — ABNORMAL HIGH (ref 70–99)
Potassium: 4.3 mmol/L (ref 3.5–5.1)
Sodium: 139 mmol/L (ref 135–145)

## 2020-04-21 LAB — LEGIONELLA PNEUMOPHILA SEROGP 1 UR AG: L. pneumophila Serogp 1 Ur Ag: NEGATIVE

## 2020-04-21 LAB — GLUCOSE, CAPILLARY
Glucose-Capillary: 104 mg/dL — ABNORMAL HIGH (ref 70–99)
Glucose-Capillary: 113 mg/dL — ABNORMAL HIGH (ref 70–99)
Glucose-Capillary: 144 mg/dL — ABNORMAL HIGH (ref 70–99)

## 2020-04-21 LAB — CBC
HCT: 27.4 % — ABNORMAL LOW (ref 39.0–52.0)
Hemoglobin: 8.9 g/dL — ABNORMAL LOW (ref 13.0–17.0)
MCH: 30.4 pg (ref 26.0–34.0)
MCHC: 32.5 g/dL (ref 30.0–36.0)
MCV: 93.5 fL (ref 80.0–100.0)
Platelets: 226 10*3/uL (ref 150–400)
RBC: 2.93 MIL/uL — ABNORMAL LOW (ref 4.22–5.81)
RDW: 15.3 % (ref 11.5–15.5)
WBC: 12.4 10*3/uL — ABNORMAL HIGH (ref 4.0–10.5)
nRBC: 0 % (ref 0.0–0.2)

## 2020-04-21 LAB — PROCALCITONIN: Procalcitonin: <0.1 ng/mL

## 2020-04-21 MED ORDER — LEVOFLOXACIN 750 MG PO TABS
750.0000 mg | ORAL_TABLET | Freq: Every day | ORAL | 0 refills | Status: AC
Start: 2020-04-21 — End: 2020-04-28

## 2020-04-21 MED ORDER — NICOTINE 21 MG/24HR TD PT24: 21.0000 mg | MEDICATED_PATCH | Freq: Every day | TRANSDERMAL | 0 refills | Status: AC

## 2020-04-21 NOTE — Evaluation (Signed)
Physical Therapy Evaluation Patient Details Name: Troy [name] MRN: [id number] DOB: April 27, 1951 Today's Date: 04/21/2020   History of Present Illness  Troy [name] is a 21yoM who comes to [facility] on 04/18/20 [name] AMS, SOB, cough. Wife reports pt has been severely weak, unable to stand or walk. Pt was hypoxic and hypotensive. PMH: HTN, HLD, DM, COPD, Rt Renal CA, depression, GAD,CAD, CABG, LungCA, dCHF, CKD3. 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 Pt seen by palliative on 7/8 noted "Repeat CT of the chest, abdomen, and pelvis revealed progressive disease in the lung, skeleton, and adrenals.  He is felt to likely be a poor candidate for further systemic treatment." Plan now is for home with hospice.  Clinical Impression  Pt admitted with above diagnosis. Pt currently with functional limitations due to the deficits listed below (see "PT Problem List"). Upon entry, pt in bed, awake and agreeable to participate. The pt is alert and oriented x3, pleasant, minimally conversational as he has persistent dyspnea. MinA for bed mobility, MinGuardA for safety for transfers, and  Pt motivated to try AMB, able to make it 144f [name] RW and 3L/min. Functional mobility assessment demonstrates increased effort/time requirements, poor tolerance, and need for physical assistance, whereas the patient performed these at a higher level of independence PTA. Pt will benefit from skilled PT intervention to increase independence and safety with basic mobility in preparation for discharge to the venue listed below.       Follow Up Recommendations Supervision for mobility/OOB;Supervision - Intermittent;Other (comment) (pt will be followed by hospice at DC)    Equipment Recommendations  Rolling walker with 5" wheels    Recommendations for Other Services       Precautions / Restrictions Precautions Precautions: Fall Restrictions Weight Bearing Restrictions: No      Mobility  Bed Mobility Overal bed  mobility: Needs Assistance Bed Mobility: Supine to Sit;Sit to Supine     Supine to sit: Min guard Sit to supine: Min assist   General bed mobility comments: needs help with legs into bed  Transfers Overall transfer level: Needs assistance Equipment used: Rolling walker (2 wheeled) Transfers: Sit to/from Stand Sit to Stand: Min guard         General transfer comment: max effort to rise from EOB with use of hands  Ambulation/Gait Ambulation/Gait assistance: Min guard Gait Distance (Feet): 100 Feet Assistive device: Rolling walker (2 wheeled) Gait Pattern/deviations: WFL(Within Functional Limits)     General Gait Details: 3L/min maintained. Pt stops intermittently for SOB recovery, heavy support on RW  Stairs            Wheelchair Mobility    Modified Rankin (Stroke Patients Only)       Balance                                             Pertinent Vitals/Pain      Home Living Family/patient expects to be discharged to:: Private residence Living Arrangements: Children;Other relatives (Troy Amoresand JCheree [name] Son, grandson; both work fulltime first shift) Available Help at Discharge: Family Type of Home: House Home Access: Stairs to enter Entrance Stairs-Rails: None Entrance Stairs-Number of Steps: [place] [facility];Bedside commode Additional Comments: tray table; all recently porvided by hospice; has tanks and concentrator? O2 at night only.    Prior Function Level of Independence:  Needs assistance   Gait / Transfers Assistance Needed: household distance AMB  ADL's / Homemaking Assistance Needed: some self neglect PTA, but presumably able to perform with modI.        Hand Dominance        Extremity/Trunk Assessment   Upper Extremity Assessment Upper Extremity Assessment: Overall WFL for tasks assessed;Generalized weakness    Lower Extremity Assessment Lower Extremity Assessment:  Generalized weakness       Communication      Cognition Arousal/Alertness: Awake/alert Behavior During Therapy: WFL for tasks assessed/performed Overall Cognitive Status: Within Functional Limits for tasks assessed                                        General Comments      Exercises     Assessment/Plan    PT Assessment All further PT needs can be met in the next venue of care  PT Problem List Decreased strength;Decreased activity tolerance;Decreased balance;Decreased mobility       PT Treatment Interventions DME instruction;Gait training;Functional mobility training;Therapeutic activities;Therapeutic exercise;Patient/family education    PT Goals (Current goals can be found in the Care Plan section)  Acute Rehab PT Goals PT Goal Formulation: All assessment and education complete, DC therapy    Frequency     Barriers to discharge Decreased caregiver support      Co-evaluation               AM-PAC PT "6 Clicks" Mobility  Outcome Measure Help needed turning from your back to your side while in a flat bed without using bedrails?: A [place] Help needed moving from lying on your back to sitting on the side of a flat bed without using bedrails?: A [place] Help needed moving to and from a bed to a chair (including a wheelchair)?: A [place] Help needed standing up from a chair using your arms (e.g., wheelchair or bedside chair)?: A Kreisler Help needed to walk in hospital room?: A [place] Help needed climbing 3-5 steps with a railing? : A Lot 6 Click Score: 17    End of Session Equipment Utilized During Treatment: Gait belt;Oxygen Activity Tolerance: Patient tolerated treatment well;Patient limited by fatigue Patient left: in bed;with family/visitor present;with bed alarm set;with call bell/phone within reach;with nursing/sitter in room Nurse Communication: Mobility status PT Visit Diagnosis: Other abnormalities of gait and mobility (R26.89);Difficulty in  walking, not elsewhere classified (R26.2)    Time: 1020-1056 PT Time Calculation (min) (ACUTE ONLY): 36 min   Charges:   PT Evaluation $PT Eval Moderate Complexity: 1 Mod          12:48 PM, 04/21/20 Etta Grandchild, PT, DPT Physical Therapist - [facility]  [phone] (Troy [name])    Troy [name] 04/21/2020, 12:43 PM

## 2020-04-21 NOTE — Discharge Instructions (Signed)
Carbohydrate Counting for Diabetes Mellitus, Adult  Carbohydrate counting is a method of keeping track of how many carbohydrates you eat. Eating carbohydrates naturally increases the amount of sugar (glucose) in the blood. Counting how many carbohydrates you eat helps keep your blood glucose within normal limits, which helps you manage your diabetes (diabetes mellitus). It is important to know how many carbohydrates you can safely have in each meal. This is different for every person. A diet and nutrition specialist (registered dietitian) can help you make a meal plan and calculate how many carbohydrates you should have at each meal and snack. Carbohydrates are found in the following foods:  Grains, such as breads and cereals.  Dried beans and soy products.  Starchy vegetables, such as potatoes, peas, and corn.  Fruit and fruit juices.  Milk and yogurt.  Sweets and snack foods, such as cake, cookies, candy, chips, and soft drinks. How do I count carbohydrates? There are two ways to count carbohydrates in food. You can use either of the methods or a combination of both. Reading "Nutrition Facts" on packaged food The "Nutrition Facts" list is included on the labels of almost all packaged foods and beverages in the [place] It includes:  The serving size.  Information about nutrients in each serving, including the grams (g) of carbohydrate per serving. To use the "Nutrition Facts":  Decide how many servings you will have.  Multiply the number of servings by the number of carbohydrates per serving.  The resulting number is the total amount of carbohydrates that you will be having. Learning standard serving sizes of other foods When you eat carbohydrate foods that are not packaged or do not include "Nutrition Facts" on the label, you need to measure the servings in order to count the amount of carbohydrates:  Measure the foods that you will eat with a food scale or measuring cup, if  needed.  Decide how many standard-size servings you will eat.  Multiply the number of servings by 15. Most carbohydrate-rich foods have about 15 g of carbohydrates per serving. ? For example, if you eat 8 oz (170 g) of strawberries, you will have eaten 2 servings and 30 g of carbohydrates (2 servings x 15 g = 30 g).  For foods that have more than one food mixed, such as soups and casseroles, you must count the carbohydrates in each food that is included. The following list contains standard serving sizes of common carbohydrate-rich foods. Each of these servings has about 15 g of carbohydrates:   hamburger bun or  English muffin.   oz (15 mL) syrup.   oz (14 g) jelly.  1 slice of bread.  1 six-inch tortilla.  3 oz (85 g) cooked rice or pasta.  4 oz (113 g) cooked dried beans.  4 oz (113 g) starchy vegetable, such as peas, corn, or potatoes.  4 oz (113 g) hot cereal.  4 oz (113 g) mashed potatoes or  of a large baked potato.  4 oz (113 g) canned or frozen fruit.  4 oz (120 mL) fruit juice.  4-6 crackers.  6 chicken nuggets.  6 oz (170 g) unsweetened dry cereal.  6 oz (170 g) plain fat-free yogurt or yogurt sweetened with artificial sweeteners.  8 oz (240 mL) milk.  8 oz (170 g) fresh fruit or one small piece of fruit.  24 oz (680 g) popped popcorn. Example of carbohydrate counting Sample meal  3 oz (85 g) chicken breast.  6 oz (170 g)  brown rice.  4 oz (113 g) corn.  8 oz (240 mL) milk.  8 oz (170 g) strawberries with sugar-free whipped topping. Carbohydrate calculation 1. Identify the foods that contain carbohydrates: ? Rice. ? Corn. ? Milk. ? Strawberries. 2. Calculate how many servings you have of each food: ? 2 servings rice. ? 1 serving corn. ? 1 serving milk. ? 1 serving strawberries. 3. Multiply each number of servings by 15 g: ? 2 servings rice x 15 g = 30 g. ? 1 serving corn x 15 g = 15 g. ? 1 serving milk x 15 g = 15 g. ? 1  serving strawberries x 15 g = 15 g. 4. Add together all of the amounts to find the total grams of carbohydrates eaten: ? 30 g + 15 g + 15 g + 15 g = 75 g of carbohydrates total. Summary  Carbohydrate counting is a method of keeping track of how many carbohydrates you eat.  Eating carbohydrates naturally increases the amount of sugar (glucose) in the blood.  Counting how many carbohydrates you eat helps keep your blood glucose within normal limits, which helps you manage your diabetes.  A diet and nutrition specialist (registered dietitian) can help you make a meal plan and calculate how many carbohydrates you should have at each meal and snack. This information is not intended to replace advice given to you by your health care provider. Make sure you discuss any questions you have with your health care provider. Document Revised: 04/24/2017 Document Reviewed: 03/13/2016 Elsevier Patient Education  2020 [place].   Diabetes Basics  Diabetes (diabetes mellitus) is a long-term (chronic) disease. It occurs when the body does not properly use sugar (glucose) that is released from food after you eat. Diabetes may be caused by one or both of these problems:  Your pancreas does not make enough of a hormone called insulin.  Your body does not react in a normal way to insulin that it makes. Insulin lets sugars (glucose) go into cells in your body. This gives you energy. If you have diabetes, sugars cannot get into cells. This causes high blood sugar (hyperglycemia). Follow these instructions at home: How is diabetes treated? You may need to take insulin or other diabetes medicines daily to keep your blood sugar in balance. Take your diabetes medicines every day as told by your doctor. List your diabetes medicines here: Diabetes medicines  Name of medicine: ______________________________ ? Amount (dose): _______________ Time (a.m./p.m.): _______________ Notes:  ___________________________________  Name of medicine: ______________________________ ? Amount (dose): _______________ Time (a.m./p.m.): _______________ Notes: ___________________________________  Name of medicine: ______________________________ ? Amount (dose): _______________ Time (a.m./p.m.): _______________ Notes: ___________________________________ If you use insulin, you will learn how to give yourself insulin by injection. You may need to adjust the amount based on the food that you eat. List the types of insulin you use here: Insulin  Insulin type: ______________________________ ? Amount (dose): _______________ Time (a.m./p.m.): _______________ Notes: ___________________________________  Insulin type: ______________________________ ? Amount (dose): _______________ Time (a.m./p.m.): _______________ Notes: ___________________________________  Insulin type: ______________________________ ? Amount (dose): _______________ Time (a.m./p.m.): _______________ Notes: ___________________________________  Insulin type: ______________________________ ? Amount (dose): _______________ Time (a.m./p.m.): _______________ Notes: ___________________________________  Insulin type: ______________________________ ? Amount (dose): _______________ Time (a.m./p.m.): _______________ Notes: ___________________________________ How do I manage my blood sugar?  Check your blood sugar levels using a blood glucose monitor as directed by your doctor. Your doctor will set treatment goals for you. Generally, you should have these blood sugar levels:  Before  meals (preprandial): 80-130 mg/dL (4.4-7.2 mmol/L).  After meals (postprandial): below 180 mg/dL (10 mmol/L).  A1c level: less than 7%. Write down the times that you will check your blood sugar levels: Blood sugar checks  Time: _______________ Notes: ___________________________________  Time: _______________ Notes:  ___________________________________  Time: _______________ Notes: ___________________________________  Time: _______________ Notes: ___________________________________  Time: _______________ Notes: ___________________________________  Time: _______________ Notes: ___________________________________  What do I need to know about low blood sugar? Low blood sugar is called hypoglycemia. This is when blood sugar is at or below 70 mg/dL (3.9 mmol/L). Symptoms may include:  Feeling: ? Hungry. ? Worried or nervous (anxious). ? Sweaty and clammy. ? Confused. ? Dizzy. ? Sleepy. ? Sick to your stomach (nauseous).  Having: ? A fast heartbeat. ? A headache. ? A change in your vision. ? Tingling or no feeling (numbness) around the mouth, lips, or tongue. ? Jerky movements that you cannot control (seizure).  Having trouble with: ? Moving (coordination). ? Sleeping. ? Passing out (fainting). ? Getting upset easily (irritability). Treating low blood sugar To treat low blood sugar, eat or drink something sugary right away. If you can think clearly and swallow safely, follow the 15:15 rule:  Take 15 grams of a fast-acting carb (carbohydrate). Talk with your doctor about how much you should take.  Some fast-acting carbs are: ? Sugar tablets (glucose pills). Take 3-4 glucose pills. ? 6-8 pieces of hard candy. ? 4-6 oz (120-150 mL) of fruit juice. ? 4-6 oz (120-150 mL) of regular (not diet) soda. ? 1 Tbsp (15 mL) honey or sugar.  Check your blood sugar 15 minutes after you take the carb.  If your blood sugar is still at or below 70 mg/dL (3.9 mmol/L), take 15 grams of a carb again.  If your blood sugar does not go above 70 mg/dL (3.9 mmol/L) after 3 tries, get help right away.  After your blood sugar goes back to normal, eat a meal or a snack within 1 hour. Treating very low blood sugar If your blood sugar is at or below 54 mg/dL (3 mmol/L), you have very low blood sugar (severe  hypoglycemia). This is an emergency. Do not wait to see if the symptoms will go away. Get medical help right away. Call your local emergency services (911 in the [place] Do not drive yourself to the hospital. Questions to ask your health care provider  Do I need to meet with a diabetes educator?  What equipment will I need to care for myself at home?  What diabetes medicines do I need? When should I take them?  How often do I need to check my blood sugar?  What number can I call if I have questions?  When is my next doctor's visit?  Where can I find a support group for people with diabetes? Where to find more information  American Diabetes Association: www.diabetes.org  American Association of Diabetes Educators: www.diabeteseducator.org/patient-resources Contact a doctor if:  Your blood sugar is at or above 240 mg/dL (13.3 mmol/L) for 2 days in a row.  You have been sick or have had a fever for 2 days or more, and you are not getting better.  You have any of these problems for more than 6 hours: ? You cannot eat or drink. ? You feel sick to your stomach (nauseous). ? You throw up (vomit). ? You have watery poop (diarrhea). Get help right away if:  Your blood sugar is lower than 54 mg/dL (3 mmol/L).  You  get confused.  You have trouble: ? Thinking clearly. ? Breathing. Summary  Diabetes (diabetes mellitus) is a long-term (chronic) disease. It occurs when the body does not properly use sugar (glucose) that is released from food after digestion.  Take insulin and diabetes medicines as told.  Check your blood sugar every day, as often as told.  Keep all follow-up visits as told by your doctor. This is important. This information is not intended to replace advice given to you by your health care provider. Make sure you discuss any questions you have with your health care provider. Document Revised: 06/23/2019 Document Reviewed: 01/02/2018 Elsevier Patient Education   [place] Pneumonia, Adult Pneumonia is an infection of the lungs. It causes swelling in the airways of the lungs. Mucus and fluid may also build up inside the airways. One type of pneumonia can happen while a person is in a hospital. A different type can happen when a person is not in a hospital (community-acquired pneumonia).  What are the causes?  This condition is caused by germs (viruses, bacteria, or fungi). Some types of germs can be passed from one person to another. This can happen when you breathe in droplets from the cough or sneeze of an infected person. What increases the risk? You are more likely to develop this condition if you:  Have a long-term (chronic) disease, such as: ? Chronic obstructive pulmonary disease (COPD). ? Asthma. ? Cystic fibrosis. ? Congestive heart failure. ? Diabetes. ? Kidney disease.  Have HIV.  Have sickle cell disease.  Have had your spleen removed.  Do not take good care of your teeth and mouth (poor dental hygiene).  Have a medical condition that increases the risk of breathing in droplets from your own mouth and nose.  Have a weakened body defense system (immune system).  Are a smoker.  Travel to areas where the germs that cause this illness are common.  Are around certain animals or the places they live. What are the signs or symptoms?  A dry cough.  A wet (productive) cough.  Fever.  Sweating.  Chest pain. This often happens when breathing deeply or coughing.  Fast breathing or trouble breathing.  Shortness of breath.  Shaking chills.  Feeling tired (fatigue).  Muscle aches. How is this treated? Treatment for this condition depends on many things. Most adults can be treated at home. In some cases, treatment must happen in a hospital. Treatment may include:  Medicines given by mouth or through an IV tube.  Being given extra oxygen.  Respiratory therapy. In rare cases, treatment  for very bad pneumonia may include:  Using a machine to help you breathe.  Having a procedure to remove fluid from around your lungs. Follow these instructions at home: Medicines  Take over-the-counter and prescription medicines only as told by your doctor. ? Only take cough medicine if you are losing sleep.  If you were prescribed an antibiotic medicine, take it as told by your doctor. Do not stop taking the antibiotic even if you start to feel better. General instructions   Sleep with your head and neck raised (elevated). You can do this by sleeping in a recliner or by putting a few pillows under your head.  Rest as needed. Get at least 8 hours of sleep each night.  Drink enough water to keep your pee (urine) pale yellow.  Eat a healthy diet that includes plenty of vegetables, fruits, whole grains, low-fat dairy products, and lean  protein.  Do not use any products that contain nicotine or tobacco. These include cigarettes, e-cigarettes, and chewing tobacco. If you need help quitting, ask your doctor.  Keep all follow-up visits as told by your doctor. This is important. How is this prevented? A shot (vaccine) can help prevent pneumonia. Shots are often suggested for:  People older than 71 years of age.  People older than 71 years of age who: ? Are having cancer treatment. ? Have long-term (chronic) lung disease. ? Have problems with their body's defense system. You may also prevent pneumonia if you take these actions:  Get the flu (influenza) shot every year.  Go to the dentist as often as told.  Wash your hands often. If you cannot use soap and water, use hand sanitizer. Contact a doctor if:  You have a fever.  You lose sleep because your cough medicine does not help. Get help right away if:  You are short of breath and it gets worse.  You have more chest pain.  Your sickness gets worse. This is very serious if: ? You are an older adult. ? Your body's defense  system is weak.  You cough up blood. Summary  Pneumonia is an infection of the lungs.  Most adults can be treated at home. Some will need treatment in a hospital.  Drink enough water to keep your pee pale yellow.  Get at least 8 hours of sleep each night. This information is not intended to replace advice given to you by your health care provider. Make sure you discuss any questions you have with your health care provider. Document Revised: 01/20/2019 Document Reviewed: 05/28/2018 Elsevier Patient Education  [place].

## 2020-04-21 NOTE — Progress Notes (Signed)
[facility] hospital liaison note:  Follow up visit to new referral for [facility] hospice services at home. Patient seen alert and about to attempt to work with PT. Wife at bedside. Patient was able to ambulate with the use of a walker.  He did return to bed and fell back to sleep afterwards. Patient's wife is requesting EMS transport late this afternoon after her son and grandson arrive home from work. Referral notified. TOC Bridget Cobb made aware. Signed out of facility DNR in place for transport. Flo Shanks BSN, RN, [place] [phone]

## 2020-04-21 NOTE — Consult Note (Signed)
Pharmacy Antibiotic Note  Troy [name] is a 69 y.o. male admitted on 04/18/2020 with HCAP in [facility].  Pharmacy has been consulted for pip/tazo dosing.  Plan: Day 4 of abx.  Zosyn 3.375g IV q8h (4 hour infusion). Switching to levofloxacin at discharge.     Height: 5\' 8"  (172.7 cm) Weight: 74.8 kg (164 lb 12.8 oz) IBW/kg (Calculated) : 68.4  Temp (24hrs), Avg:98.3 F (36.8 C), Min:97.6 F (36.4 C), Max:99.6 F (37.6 C)  Recent Labs  Lab 04/18/20 0804 04/18/20 1048 04/19/20 0449 04/20/20 0514 04/21/20 0528  WBC 13.6*  --  12.8* 12.8* 12.4*  CREATININE 2.45*  --  2.03* 1.79* 1.47*  LATICACIDVEN 0.8 0.7  --   --   --     Estimated Creatinine Clearance: 44.6 mL/min (A) (by C-G formula based on SCr of 1.47 mg/dL (H)).    Allergies  Allergen Reactions  . Codeine Nausea And Vomiting and Other (See Comments)    Pt states when he takes codeine, he throws up blood  . Niacin And Related Other (See Comments)    "made his body feel hot & he had a hard time breathing"    Dose adjustments this admission: none  Microbiology results: 7/6 BCx: pending  7/6 MRSA PCR: ordered.   Thank you for allowing pharmacy to be a part of this patient's care.  Oswald Hillock, PharmD, BCPS 04/21/2020 1:51 PM

## 2020-04-21 NOTE — Progress Notes (Signed)
Patient being discharged per ems he is alert and oriented family at bed side discharge instructions given to patient and family

## 2020-04-21 NOTE — Progress Notes (Signed)
[place]  [phone] [phone]   Name: Troy [name] Date: 04/21/2020 MRN: [id number]  DOB: 12/31/1950  Patient Care Team: [name], MD as PCP - General (Internal Medicine) Troy Sine, MD as PCP - Cardiology (Cardiology)    REASON FOR CONSULTATION: Troy [name] is a 69 y.o. 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  As part of his staging work-up for cancer he was found to have right upper lobe nodule.  He ultimately underwent PET scan and was found to have hypermetabolic lesions in the lung, left adrenal, and right kidney.  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  Patient is status post concurrent chemoradiation.  Patient was admitted to the hospital on 04/18/2020 for sepsis likely from pneumonia.  Repeat CT of the chest, abdomen, and pelvis revealed progressive disease in the lung, skeleton, and adrenals.  He is felt to likely be a poor candidate for further systemic treatment.  Palliative care was consulted to help address goals.  CODE STATUS: DNR  PAST MEDICAL HISTORY: Past Medical History:  Diagnosis Date   Anxiety    COPD (chronic obstructive pulmonary disease) ([place])    Coronary artery disease 2006   Acute coronary syndrome in March 2006.    Diabetes mellitus without complication ([place])    Dyslipidemia    Hypertension 10/09/2010   EF 40-45%   Malignant neoplasm of upper lobe of right lung (HCC)    Chemo + rad tx's.    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   Sleep apnea    uses cpap machine    PAST SURGICAL HISTORY:  Past Surgical History:  Procedure Laterality Date   Arterial  evalation      no evidence of ICA stenosis   CARDIAC CATHETERIZATION     CARDIAC SURGERY     CORONARY ARTERY BYPASS GRAFT  2006   Post CABG surgery with a LIMA to the LAD, a vein to the diagonal ,,a vein to the the obtuse marginal and vein to the PDA.   CORONARY STENT PLACEMENT  2010   EAR CYST EXCISION Right 10/05/2018   Procedure: Right Partial pinnectomy;  Surgeon: Izora Gala, MD;  Location: [place];  Service: ENT;  Laterality: Right;   GRAFT(S) ANGIOGRAM  10/21/2011   Procedure: GRAFT(S) Cyril Loosen;  Surgeon: Leonie Man, MD;  Location[facility] CATH LAB;  Service: Cardiovascular;;   LEFT HEART CATHETERIZATION WITH CORONARY ANGIOGRAM N/A 10/21/2011   Procedure: LEFT HEART CATHETERIZATION WITH CORONARY ANGIOGRAM;  Surgeon: Leonie Man, MD;  Location: [facility] CATH LAB;  Service: Cardiovascular;  Laterality: N/A;   LYMPH NODE BIOPSY Right 10/05/2018   Procedure: sentinel NODE BIOPSY with right partial modified neck dissection;  Surgeon: Izora Gala, MD;  Location: [place];  Service: ENT;  Laterality: Right;   PORTA CATH INSERTION N/A 07/12/2019   Procedure: PORTA CATH INSERTION;  Surgeon: Algernon Huxley, MD;  Location: [place] CV LAB;  Service: Cardiovascular;  Laterality: N/A;   ROBOT ASSISTED LAPAROSCOPIC NEPHRECTOMY Right 12/13/2019   Procedure: XI ROBOTIC ASSISTED LAPAROSCOPIC NEPHRECTOMY;  Surgeon: Hollice Espy, MD;  Location: [facility] ORS;  Service: Urology;  Laterality: Right;   SURGERY OF LIP  SKIN CANCER    HEMATOLOGY/ONCOLOGY HISTORY:  Oncology History  Malignant neoplasm of upper lobe of right lung ([place])  07/02/2019 Cancer Staging   Staging form: Lung, AJCC 8th Edition - Clinical stage from 07/02/2019: Stage IA3 (cT1c, cN0, cM0) - Signed by Sindy Guadeloupe, MD on 07/05/2019   07/05/2019 Initial Diagnosis   Malignant neoplasm of upper lobe of right lung ([place])   07/20/2019 -  Chemotherapy   The patient had palonosetron (ALOXI) injection 0.25 mg, 0.25 mg, Intravenous,  Once,  4 of 4 cycles Administration: 0.25 mg (07/20/2019), 0.25 mg (08/10/2019), 0.25 mg (08/31/2019), 0.25 mg (09/28/2019) pegfilgrastim (NEULASTA ONPRO KIT) injection 6 mg, 6 mg, Subcutaneous, Once, 1 of 1 cycle Administration: 6 mg (09/02/2019) CARBOplatin (PARAPLATIN) 440 mg in sodium chloride 0.9 % 250 mL chemo infusion, 440 mg (100 % of original dose 439.5 mg), Intravenous,  Once, 4 of 4 cycles Dose modification:   (original dose 439.5 mg, Cycle 1) Administration: 440 mg (07/20/2019), 440 mg (08/10/2019), 350 mg (08/31/2019), 400 mg (09/28/2019) etoposide (VEPESID) 200 mg in sodium chloride 0.9 % 500 mL chemo infusion, 100 mg/m2 = 200 mg, Intravenous,  Once, 4 of 4 cycles Administration: 200 mg (07/20/2019), 200 mg (07/21/2019), 200 mg (09/02/2019), 200 mg (08/10/2019), 200 mg (07/22/2019), 200 mg (08/11/2019), 200 mg (08/12/2019), 200 mg (09/01/2019), 200 mg (08/31/2019), 200 mg (09/28/2019), 200 mg (09/29/2019) fosaprepitant (EMEND) 150 mg, dexamethasone (DECADRON) 12 mg in sodium chloride 0.9 % 145 mL IVPB, , Intravenous,  Once, 4 of 4 cycles Administration:  (07/20/2019),  (08/10/2019),  (08/31/2019),  (09/28/2019)  for chemotherapy treatment.      ALLERGIES:  is allergic to codeine and niacin and related.  MEDICATIONS:  Current Facility-Administered Medications  Medication Dose Route Frequency Provider Last Rate Last Admin   0.9 %  sodium chloride infusion   Intravenous PRN Shahmehdi, Seyed A, MD   Stopping Infusion hung by another clincian at 04/20/20 2000   acetaminophen (TYLENOL) tablet 650 mg  650 mg Oral Q6H PRN Oswald Hillock, RPH       albuterol (PROVENTIL) (2.5 MG/3ML) 0.083% nebulizer solution 2.5 mg  2.5 mg Nebulization Q4H PRN Ivor Costa, MD       ALPRAZolam Duanne Moron) tablet 0.25 mg  0.25 mg Oral TID Ivor Costa, MD   0.25 mg at 04/21/20 1138   aspirin EC tablet 81 mg  81 mg Oral Daily Ivor Costa, MD   81 mg at 04/21/20 1138   atorvastatin (LIPITOR) tablet 40 mg  40 mg Oral QHS  Ivor Costa, MD   40 mg at 04/20/20 2308   busPIRone (BUSPAR) tablet 7.5 mg  7.5 mg Oral BID Ivor Costa, MD   7.5 mg at 04/20/20 2310   cyclobenzaprine (FLEXERIL) tablet 5 mg  5 mg Oral TID PRN Ivor Costa, MD   5 mg at 04/19/20 2216   dextromethorphan-guaiFENesin ([place] DM) 30-600 MG per 12 hr tablet 1 tablet  1 tablet Oral BID Ivor Costa, MD   1 tablet at 04/21/20 1138   enoxaparin (LOVENOX) injection 40 mg  40 mg Subcutaneous Q24H Oswald Hillock, RPH   40 mg at 04/20/20 2301   fluticasone furoate-vilanterol (BREO ELLIPTA) 100-25 MCG/INH 1 puff  1 puff Inhalation Daily Ivor Costa, MD   1 puff at 04/20/20 1000   And   umeclidinium bromide (INCRUSE ELLIPTA) 62.5 MCG/INH 1 puff  1 puff Inhalation Daily Ivor Costa, MD   1 puff at 04/20/20 1000   insulin aspart (novoLOG) injection 0-5 Units  0-5 Units Subcutaneous QHS Ivor Costa, MD       insulin aspart (novoLOG) injection 0-9 Units  0-9 Units Subcutaneous TID WC Ivor Costa, MD   3 Units at 04/19/20 1838   ipratropium-albuterol (DUONEB) 0.5-2.5 (3) MG/3ML nebulizer solution 3 mL  3 mL Nebulization Q6H Shahmehdi, Seyed A, MD   3 mL at 04/21/20 1303   nicotine (NICODERM CQ - dosed in mg/24 hours) patch 21 mg  21 mg Transdermal Daily Ivor Costa, MD   21 mg at 04/20/20 1030   ondansetron (ZOFRAN) injection 4 mg  4 mg Intravenous Q8H PRN Ivor Costa, MD       oxyCODONE (Oxy IR/ROXICODONE) immediate release tablet 10 mg  10 mg Oral Q4H PRN Ivor Costa, MD   10 mg at 04/21/20 0435   piperacillin-tazobactam (ZOSYN) IVPB 3.375 g  3.375 g Intravenous Q8H Oswald Hillock, RPH 12.5 mL/hr at 04/21/20 0611 3.375 g at 04/21/20 0321   ranolazine (RANEXA) 12 hr tablet 1,000 mg  1,000 mg Oral BID Ivor Costa, MD   1,000 mg at 04/20/20 2309   Facility-Administered Medications Ordered in Other Encounters  Medication Dose Route Frequency Provider Last Rate Last Admin   heparin lock flush 100 unit/mL  500 Units Intravenous Once Sindy Guadeloupe, MD        sodium chloride flush (NS) 0.9 % injection 10 mL  10 mL Intravenous PRN Sindy Guadeloupe, MD       sodium chloride flush (NS) 0.9 % injection 10 mL  10 mL Intravenous PRN Sindy Guadeloupe, MD   10 mL at 08/31/19 1123    VITAL SIGNS: BP (!) 106/42 (BP Location: Left Arm)    Pulse 87    Temp 98 F (36.7 C)    Resp 16    Ht _0  (1.727 m)    Wt 164 lb 12.8 oz (74.8 kg)    SpO2 94%    BMI 25.06 kg/m  Filed Weights   04/18/20 0754 04/19/20 0226 04/21/20 0436  Weight: 168 lb 12.8 oz (76.6 kg) 168 lb 11.2 oz (76.5 kg) 164 lb 12.8 oz (74.8 kg)    Estimated body mass index is 25.06 kg/m as calculated from the following:   Height as of this encounter: _1  (1.727 m).   Weight as of this encounter: 164 lb 12.8 oz (74.8 kg).  LABS: CBC:    Component Value Date/Time   WBC 12.4 (H) 04/21/2020 0528   HGB 8.9 (L) 04/21/2020 0528   HGB 10.9 (L) 11/15/2019 0827   HCT 27.4 (L) 04/21/2020 0528   HCT 34.5 (L) 11/15/2019 0827   PLT 226 04/21/2020 0528   PLT 282 11/15/2019 0827   MCV 93.5 04/21/2020 0528   MCV 97 11/15/2019 0827   MCV 93 07/13/2014 1331   NEUTROABS 10.6 (H) 04/18/2020 0804   LYMPHSABS 1.0 04/18/2020 0804   MONOABS 1.8 (H) 04/18/2020 0804   EOSABS 0.0 04/18/2020 0804   BASOSABS 0.1 04/18/2020 0804   Comprehensive Metabolic Panel:    Component Value Date/Time   NA 139 04/21/2020 0528   NA 142 11/15/2019 0827   NA 139 07/13/2014 1331   K 4.3 04/21/2020 0528   K 4.1 07/13/2014 1331   CL 105 04/21/2020 0528   CL 107 07/13/2014 1331   CO2 28 04/21/2020 0528   CO2 26 07/13/2014 1331   BUN 29 (H) 04/21/2020 0528   BUN 9 11/15/2019 0827   BUN 16 07/13/2014 1331   CREATININE 1.47 (H)  04/21/2020 0528   CREATININE 1.32 (H) 01/03/2017 1303   GLUCOSE 120 (H) 04/21/2020 0528   GLUCOSE 102 (H) 07/13/2014 1331   CALCIUM 8.5 (L) 04/21/2020 0528   CALCIUM 8.7 07/13/2014 1331   AST 56 (H) 04/18/2020 0804   AST 14 (L) 07/13/2014 1331   ALT 17 04/18/2020 0804   ALT 23 07/13/2014 1331     ALKPHOS 85 04/18/2020 0804   ALKPHOS 78 07/13/2014 1331   BILITOT 1.1 04/18/2020 0804   BILITOT 0.4 11/15/2019 0827   BILITOT 0.8 07/13/2014 1331   PROT 6.7 04/18/2020 0804   PROT 6.7 11/15/2019 0827   PROT 7.3 07/13/2014 1331   ALBUMIN 3.2 (L) 04/18/2020 0804   ALBUMIN 3.7 (L) 11/15/2019 0827   ALBUMIN 3.5 07/13/2014 1331    RADIOGRAPHIC STUDIES: CT ABDOMEN PELVIS WO CONTRAST  Result Date: 04/18/2020 CLINICAL DATA:  Shortness of breath, altered mental status and not eating or drinking. 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 EXAM: CT CHEST, ABDOMEN AND PELVIS WITHOUT CONTRAST TECHNIQUE: Multidetector CT imaging of the chest, abdomen and pelvis was performed following the standard protocol without IV contrast. COMPARISON:  CT scan 03/03/2020 FINDINGS: CT CHEST FINDINGS Cardiovascular: The heart is normal in size. No pericardial effusion. Stable tortuosity and extensive calcification of the thoracic aorta and coronary arteries. Surgical changes from coronary artery bypass surgery. Mediastinum/Nodes: Stable scattered sub 8 mm mediastinal and hilar lymph nodes. No supraclavicular or axillary adenopathy. Lungs/Pleura: Stable advanced emphysematous changes and pulmonary scarring. Progressive pulmonary metastatic disease. Stable radiation changes in the right upper lobe laterally. No obvious recurrent tumor in this location. Right lower lobe airspace consolidation with air bronchograms suggesting pneumonia. The left lung is clear of an acute process. Musculoskeletal: New lytic bone lesion involving the right scapula is noted. There is also a progressive destructive lytic bone lesion involving the left scapula. No obvious sternal or thoracic vertebral body lesions. No obvious rib lesions. CT ABDOMEN PELVIS FINDINGS Hepatobiliary: No obvious hepatic lesions are identified without contrast. Layering high attenuation material in the gallbladder is likely sludge. No common bile duct  dilatation. Pancreas: No pancreatic mass, inflammation or ductal dilatation. Spleen: Normal size.  No focal lesions. Adrenals/Urinary Tract: Enlarging bilateral adrenal gland metastasis. Status post right nephrectomy. Stable left renal cysts. The bladder is unremarkable. Stomach/Bowel: The stomach, duodenum, small bowel and colon are grossly normal without contrast. Vascular/Lymphatic: Stable advanced vascular calcifications. No mesenteric or retroperitoneal mass or adenopathy. Reproductive: The prostate gland and seminal vesicles are unremarkable Other: No pelvic mass or adenopathy. No free pelvic fluid collections. No inguinal mass or adenopathy. No abdominal wall hernia or subcutaneous lesions. Musculoskeletal: Progressive destructive bone lesion involving the left ischium. Progressive destructive lesion involving the left ilium anteriorly. Slight progression of L3 lytic metastasis. IMPRESSION: 1. Progressive pulmonary metastatic disease. 2. Right lower lobe airspace consolidation with air bronchograms suggesting pneumonia. 3. Enlarging bilateral adrenal gland metastasis. 4. Progressive osseous metastatic disease. 5. Stable radiation changes in the right upper lobe laterally. No obvious recurrent tumor in this location. Aortic Atherosclerosis (ICD10-I70.0) and Emphysema (ICD10-J43.9). Electronically Signed   By: Marijo Sanes M.D.   On: 04/18/2020 10:30   CT Chest Wo Contrast  Result Date: 04/18/2020 CLINICAL DATA:  Shortness of breath, altered mental status and not eating or drinking. 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 EXAM: CT CHEST, ABDOMEN AND PELVIS WITHOUT CONTRAST TECHNIQUE: Multidetector CT imaging of the chest, abdomen and pelvis was performed following the standard protocol without IV  contrast. COMPARISON:  CT scan 03/03/2020 FINDINGS: CT CHEST FINDINGS Cardiovascular: The heart is normal in size. No pericardial effusion. Stable tortuosity and extensive calcification of the  thoracic aorta and coronary arteries. Surgical changes from coronary artery bypass surgery. Mediastinum/Nodes: Stable scattered sub 8 mm mediastinal and hilar lymph nodes. No supraclavicular or axillary adenopathy. Lungs/Pleura: Stable advanced emphysematous changes and pulmonary scarring. Progressive pulmonary metastatic disease. Stable radiation changes in the right upper lobe laterally. No obvious recurrent tumor in this location. Right lower lobe airspace consolidation with air bronchograms suggesting pneumonia. The left lung is clear of an acute process. Musculoskeletal: New lytic bone lesion involving the right scapula is noted. There is also a progressive destructive lytic bone lesion involving the left scapula. No obvious sternal or thoracic vertebral body lesions. No obvious rib lesions. CT ABDOMEN PELVIS FINDINGS Hepatobiliary: No obvious hepatic lesions are identified without contrast. Layering high attenuation material in the gallbladder is likely sludge. No common bile duct dilatation. Pancreas: No pancreatic mass, inflammation or ductal dilatation. Spleen: Normal size.  No focal lesions. Adrenals/Urinary Tract: Enlarging bilateral adrenal gland metastasis. Status post right nephrectomy. Stable left renal cysts. The bladder is unremarkable. Stomach/Bowel: The stomach, duodenum, small bowel and colon are grossly normal without contrast. Vascular/Lymphatic: Stable advanced vascular calcifications. No mesenteric or retroperitoneal mass or adenopathy. Reproductive: The prostate gland and seminal vesicles are unremarkable Other: No pelvic mass or adenopathy. No free pelvic fluid collections. No inguinal mass or adenopathy. No abdominal wall hernia or subcutaneous lesions. Musculoskeletal: Progressive destructive bone lesion involving the left ischium. Progressive destructive lesion involving the left ilium anteriorly. Slight progression of L3 lytic metastasis. IMPRESSION: 1. Progressive pulmonary metastatic  disease. 2. Right lower lobe airspace consolidation with air bronchograms suggesting pneumonia. 3. Enlarging bilateral adrenal gland metastasis. 4. Progressive osseous metastatic disease. 5. Stable radiation changes in the right upper lobe laterally. No obvious recurrent tumor in this location. Aortic Atherosclerosis (ICD10-I70.0) and Emphysema (ICD10-J43.9). Electronically Signed   By: Marijo Sanes M.D.   On: 04/18/2020 10:30   US Venous Img Lower Unilateral Right  Result Date: 04/18/2020 CLINICAL DATA:  69 year old male with a history swelling EXAM: RIGHT LOWER EXTREMITY VENOUS DOPPLER ULTRASOUND TECHNIQUE: Gray-scale sonography with graded compression, as well as color Doppler and duplex ultrasound were performed to evaluate the lower extremity deep venous systems from the level of the common femoral vein and including the common femoral, femoral, profunda femoral, popliteal and calf veins including the posterior tibial, peroneal and gastrocnemius veins when visible. The superficial great saphenous vein was also interrogated. Spectral Doppler was utilized to evaluate flow at rest and with distal augmentation maneuvers in the common femoral, femoral and popliteal veins. COMPARISON:  None. FINDINGS: Contralateral Common Femoral Vein: Respiratory phasicity is normal and symmetric with the symptomatic side. No evidence of thrombus. Normal compressibility. Common Femoral Vein: No evidence of thrombus. Normal compressibility, respiratory phasicity and response to augmentation. Saphenofemoral Junction: No evidence of thrombus. Normal compressibility and flow on color Doppler imaging. Profunda Femoral Vein: No evidence of thrombus. Normal compressibility and flow on color Doppler imaging. Femoral Vein: No evidence of thrombus. Normal compressibility, respiratory phasicity and response to augmentation. Popliteal Vein: No evidence of thrombus. Normal compressibility, respiratory phasicity and response to augmentation.  Calf Veins: No evidence of thrombus. Normal compressibility and flow on color Doppler imaging. Superficial Great Saphenous Vein: No evidence of thrombus. Normal compressibility and flow on color Doppler imaging. Other Findings:  None. IMPRESSION: Sonographic survey of the right lower extremity negative for  DVT Electronically Signed   By: Corrie Mckusick D.O.   On: 04/18/2020 09:55   DG Chest Port 1 View  Result Date: 04/18/2020 CLINICAL DATA:  ALTERED MENTAL STATUS EXAM: PORTABLE CHEST 1 VIEW COMPARISON:  12/14/2019; 06/25/2019; chest CT-03/03/2020 FINDINGS: Grossly unchanged enlarged cardiac silhouette and mediastinal contours post median sternotomy and CABG. Slight retraction of a left jugular approach port a catheter with tip now projected over the central aspect of the left innominate vein. The lungs remain hyperexpanded. Ill-defined nodular interstitial opacities involving the peripheral aspect of the right mid lung as well as the right lung base are unchanged. No new discrete focal airspace opacities. Persistent blunting of the right costophrenic angle without definite pleural effusion. Note is made of a crescentic lucency below the left hemidiaphragm. No acute osseous abnormalities. IMPRESSION: 1. Similar findings of cardiomegaly and known right greater than left pulmonary metastatic disease. 2. Crescentic lucency below the left hemidiaphragm may represent air within the stomach, however pneumoperitoneum could have a similar appearance. Correlation for abdominal symptoms is advised. Further evaluation with decubital abdominal radiographic series could be performed as indicated. 3. Continued retraction of left jugular approach port a catheter with tip now projected over the central aspect the left innominate vein. Critical Value/emergent results were called by telephone at the time of interpretation on 04/18/2020 at 8:23 am to provider PHILLIP STAFFORD , who verbally acknowledged these results. Electronically  Signed   By: Sandi Mariscal M.D.   On: 04/18/2020 08:22    PERFORMANCE STATUS (ECOG) : 3 - Symptomatic, >50% confined to bed  Review of Systems Unless otherwise noted, a complete review of systems is negative.  Physical Exam General: NAD, frail appearing Pulmonary: unlabored Extremities: no edema, no joint deformities Skin: no rashes Neurological: Weakness but otherwise nonfocal  IMPRESSION: Patient is comfortable appearing today.  He denies any changes or symptomatic complaints.  Patient was seen after having just walked 100 feet down the hall with physical therapy.  Discussed with physical therapist.  Patient ambulated with use of a walker and was able to stand on his own with some effort.  PT recommended consideration of ambulance transport home from the hospital given the necessity of climbing stairs to access to home.  This information was relayed to the hospice liaison who is helping coordinate discharge.  Spoke with patient's wife.  She confirmed plan for home with hospice later today.  PLAN: -Best supportive care -Home with hospice when medically ready -Continue use of oxycodone as needed for pain   Patient expressed understanding and was in agreement with this plan. He also understands that He can call the clinic at any time with any questions, concerns, or complaints.     Time Total: 15 minutes  Visit consisted of counseling and education dealing with the complex and emotionally intense issues of symptom management and palliative care in the setting of serious and potentially life-threatening illness.Greater than 50%  of this time was spent counseling and coordinating care related to the above assessment and plan.  Signed by: Altha Harm, PhD, NP-C

## 2020-04-21 NOTE — Discharge Summary (Signed)
Physician Discharge Summary [facility]    Patient: Troy [name]                   Admit date: 04/18/2020   DOB: Apr 23, 1951             Discharge date:04/21/2020/12:19 PM JJK:[id number]                          PCP: Troy Marble, MD  Disposition: Home with home hospice  Recommendations for Outpatient Follow-up:    Follow up: Hospice care at home  Discharge Condition: Stable   Code Status:   Code Status: DNR  Diet recommendation: Regular healthy diet   Discharge Diagnoses:    Principal Problem:   HCAP (healthcare-associated pneumonia) Active Problems:   HTN (hypertension)   Smoking   Malignant neoplasm of upper lobe of right lung ([place])   Acute on chronic respiratory failure with hypoxia and hypercapnia (HCC)   HLD (hyperlipidemia)   COPD (chronic obstructive pulmonary disease) ([place])   Sepsis ([place])   Depression with anxiety   CAD (coronary artery disease)   Chronic diastolic CHF (congestive heart failure) ([place])   Acute renal failure superimposed on stage 3a chronic kidney disease ([place])   Normocytic anemia   Acute metabolic encephalopathy   Palliative care encounter   History of Present Illness/ Hospital Course Troy [name] Summary:   Per HPI Troy Mesta Littleis a 69 y.o.malewith medical history significant ofhypertension, hyperlipidemia, diabetes mellitus, COPD, right renal cancer (s/p of nephrectomy), depression, anxiety, CAD, CABG,metastasizedlung cancer (radiation and chemotherapy), 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  Patient's initial work-up consistent with possible pneumonia, SARS-CoV-2 negative CT scan chest and abdomen reviewed metastatic disease right lower lobe airspace consolidation   Sepsisand acute on chronic respiratory failure with hypoxia and hypercapniadue toHCAP (healthcare-associated pneumonia): -Patient met the criteria  for sepsis due to hypotension, leukocytosis.  Normal lactic acid, -Hypotension resolved with fluid resuscitation BP as low as 82/39, currently 109/65 -Currently  stable  - IV Vancomycin and cefepimewere given in ED.  Will switchcefepime to Zosyn to cover possible aspiration >>> switch to p.o. Levaquin - Mucinex for cough  - Bronchodilators - Urine legionella and S. 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 -Palliative/hospice was consulted -Follow-up with oncology -recommended no further intervention, home with hospice   HLD (hyperlipidemia) -Lipitor, Zetia  COPD (chronic obstructive pulmonary disease) (HCC) -Bronchodilators  Depression with anxiety -Continue home medications  CAD (coronary artery disease): S/p of CABG, No chest pain. -Continue aspirin, Zetia, Lipitor and Ranexa  Chronic diastolic CHF (congestive heart failure) ([place]):  2D echo 11/08/2019 showed EF of 50% with grade 1 diastolic  dysfunction. Patient has 1+ leg edema.No JVD. Does not seem to have pulmonary edema  chest x-ray. CHF does not seem to be exacerbated, he is at risk of having CHF exacerbation. -Will not give diuretics due to sepsis and hypotension  Acute renal failure superimposed on stage 3a chronic kidney disease ([place]): Baseline creatinine 1.0-1.2, recent creatinine 1.82 on 03/03/2020. His creatinine is 2.45, BUN 33. Likely due to multifactorial etiology, including decreased oral intake, possible ATN secondary to hypotension and continuation of Cozaar -Follow-up renal function by BMP  Normocytic anemia: Hgb 11.8 on 03/03/20 -->9.7. Noactive bleeding -Follow-up of CBC  Acute metabolic encephalopathy:Likely multifactorial etiology as listed above -Frequent neuro checks     Code Status:DNR (discussed with patient's wife,and explained the meaning of CODE STATUS.Wife states that patientisDNR) Hospice consulted, patient is accepted hospice at home. Family Communication: Yes, patient's wife/daughterat bed side Disposition Plan: Anticipate discharge back to previous environment Consults called:Oncology, hospice,     Nutritional status:          Discharge Instructions:   Discharge Instructions    Activity as tolerated - No restrictions   Complete by: As directed    Diet - low sodium heart healthy   Complete by: As directed    Discharge instructions   Complete by: As directed    Discharging home with home hospice. Please follow-up with oncologist as needed   Increase activity slowly   Complete by: As directed        Medication List    TAKE these medications   ALPRAZolam 0.25 MG tablet Commonly known as: XANAX Take 0.25 mg by mouth 3 (three) times daily.   amLODipine 10 MG tablet Commonly known as: NORVASC Take 10 mg by mouth every morning.   aspirin EC 81 MG tablet Take 81 mg by mouth daily.   atorvastatin 40 MG tablet Commonly known as: LIPITOR TAKE 1 TABLET (40 MG TOTAL) BY MOUTH AT BEDTIME. What changed: See the new instructions.    bisoprolol 5 MG tablet Commonly known as: ZEBETA Take 5 mg by mouth daily.   busPIRone 7.5 MG tablet Commonly known as: BUSPAR Take 7.5 mg by mouth 2 (two) times daily.   Combivent Respimat 20-100 MCG/ACT Aers respimat Generic drug: Ipratropium-Albuterol Inhale 2 puffs into the lungs every 6 (six) hours.   cyclobenzaprine 5 MG tablet Commonly known as: FLEXERIL TAKE 1 TABLET BY MOUTH THREE TIMES A DAY AS NEEDED FOR MUSCLE SPASMS What changed: See the new instructions.   ezetimibe 10 MG tablet Commonly known as: ZETIA TAKE 1 TABLET BY MOUTH EVERY DAY What changed: how much to take   Incruse Ellipta 62.5 MCG/INH Aepb Generic drug: umeclidinium bromide Inhale 1 puff into the lungs daily.   levofloxacin 750 MG tablet Commonly known as: Levaquin Take 1 tablet (750 mg total) by mouth daily for 7 days.   losartan 50 MG tablet Commonly known as: Cozaar Take 1 tablet (50 mg total) by mouth daily.   metFORMIN 500 MG tablet Commonly known as: GLUCOPHAGE TAKE 1 TABLET (500 MG TOTAL) BY MOUTH 2 (TWO) TIMES DAILY WITH A MEAL. What changed: when to take this   nicotine 21 mg/24hr patch Commonly known as: NICODERM CQ - dosed in mg/24 hours Place 1 patch (21 mg total) onto the skin daily.   Oxycodone HCl 10 MG Tabs Take 1 tablet (10 mg total) by mouth every 4 (four) hours as needed.   ranolazine 1000 MG SR tablet Commonly known as: RANEXA TAKE 1 TABLET BY MOUTH  TWICE A DAY   Trelegy Ellipta 100-62.5-25 MCG/INH Aepb Generic drug: Fluticasone-Umeclidin-Vilant Inhale 1 puff into the lungs daily.   Xtampza ER 18 MG C12a Generic drug: oxyCODONE ER Take 1 tablet by mouth every 12 (twelve) hours.       Allergies  Allergen Reactions   Codeine Nausea And Vomiting and Other (See Comments)    Pt states when he takes codeine, he throws up blood   Niacin And Related Other (See Comments)    "made his body feel hot & he had a hard time breathing"     Procedures /Studies:    CT ABDOMEN PELVIS WO CONTRAST  Result Date: 04/18/2020 CLINICAL DATA:  Shortness of breath, altered mental status and not eating or drinking. 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 EXAM: CT CHEST, ABDOMEN AND PELVIS WITHOUT CONTRAST TECHNIQUE: Multidetector CT imaging of the chest, abdomen and pelvis was performed following the standard protocol without IV contrast. COMPARISON:  CT scan 03/03/2020 FINDINGS: CT CHEST FINDINGS Cardiovascular: The heart is normal in size. No pericardial effusion. Stable tortuosity and extensive calcification of the thoracic aorta and coronary arteries. Surgical changes from coronary artery bypass surgery. Mediastinum/Nodes: Stable scattered sub 8 mm mediastinal and hilar lymph nodes. No supraclavicular or axillary adenopathy. Lungs/Pleura: Stable advanced emphysematous changes and pulmonary scarring. Progressive pulmonary metastatic disease. Stable radiation changes in the right upper lobe laterally. No obvious recurrent tumor in this location. Right lower lobe airspace consolidation with air bronchograms suggesting pneumonia. The left lung is clear of an acute process. Musculoskeletal: New lytic bone lesion involving the right scapula is noted. There is also a progressive destructive lytic bone lesion involving the left scapula. No obvious sternal or thoracic vertebral body lesions. No obvious rib lesions. CT ABDOMEN PELVIS FINDINGS Hepatobiliary: No obvious hepatic lesions are identified without contrast. Layering high attenuation material in the gallbladder is likely sludge. No common bile duct dilatation. Pancreas: No pancreatic mass, inflammation or ductal dilatation. Spleen: Normal size.  No focal lesions. Adrenals/Urinary Tract: Enlarging bilateral adrenal gland metastasis. Status post right nephrectomy. Stable left renal cysts. The bladder is unremarkable. Stomach/Bowel: The stomach, duodenum, small bowel and colon are grossly normal without contrast.  Vascular/Lymphatic: Stable advanced vascular calcifications. No mesenteric or retroperitoneal mass or adenopathy. Reproductive: The prostate gland and seminal vesicles are unremarkable Other: No pelvic mass or adenopathy. No free pelvic fluid collections. No inguinal mass or adenopathy. No abdominal wall hernia or subcutaneous lesions. Musculoskeletal: Progressive destructive bone lesion involving the left ischium. Progressive destructive lesion involving the left ilium anteriorly. Slight progression of L3 lytic metastasis. IMPRESSION: 1. Progressive pulmonary metastatic disease. 2. Right lower lobe airspace consolidation with air bronchograms suggesting pneumonia. 3. Enlarging bilateral adrenal gland metastasis. 4. Progressive osseous metastatic disease. 5. Stable radiation changes in the right upper lobe laterally. No obvious recurrent tumor in this location. Aortic Atherosclerosis (ICD10-I70.0) and Emphysema (ICD10-J43.9). Electronically Signed   By: Marijo Sanes M.D.   On: 04/18/2020 10:30   CT Chest Wo Contrast  Result Date: 04/18/2020 CLINICAL DATA:  Shortness of breath, altered mental status and not eating or drinking. 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 EXAM: CT CHEST, ABDOMEN AND PELVIS WITHOUT CONTRAST TECHNIQUE: Multidetector CT imaging of the chest, abdomen and pelvis was performed following the standard protocol without IV contrast. COMPARISON:  CT scan 03/03/2020 FINDINGS: CT CHEST FINDINGS Cardiovascular: The heart is normal in size. No pericardial effusion. Stable tortuosity and extensive calcification of the thoracic aorta and coronary arteries.  Surgical changes from coronary artery bypass surgery. Mediastinum/Nodes: Stable scattered sub 8 mm mediastinal and hilar lymph nodes. No supraclavicular or axillary adenopathy. Lungs/Pleura: Stable advanced emphysematous changes and pulmonary scarring. Progressive pulmonary metastatic disease. Stable radiation changes in the right  upper lobe laterally. No obvious recurrent tumor in this location. Right lower lobe airspace consolidation with air bronchograms suggesting pneumonia. The left lung is clear of an acute process. Musculoskeletal: New lytic bone lesion involving the right scapula is noted. There is also a progressive destructive lytic bone lesion involving the left scapula. No obvious sternal or thoracic vertebral body lesions. No obvious rib lesions. CT ABDOMEN PELVIS FINDINGS Hepatobiliary: No obvious hepatic lesions are identified without contrast. Layering high attenuation material in the gallbladder is likely sludge. No common bile duct dilatation. Pancreas: No pancreatic mass, inflammation or ductal dilatation. Spleen: Normal size.  No focal lesions. Adrenals/Urinary Tract: Enlarging bilateral adrenal gland metastasis. Status post right nephrectomy. Stable left renal cysts. The bladder is unremarkable. Stomach/Bowel: The stomach, duodenum, small bowel and colon are grossly normal without contrast. Vascular/Lymphatic: Stable advanced vascular calcifications. No mesenteric or retroperitoneal mass or adenopathy. Reproductive: The prostate gland and seminal vesicles are unremarkable Other: No pelvic mass or adenopathy. No free pelvic fluid collections. No inguinal mass or adenopathy. No abdominal wall hernia or subcutaneous lesions. Musculoskeletal: Progressive destructive bone lesion involving the left ischium. Progressive destructive lesion involving the left ilium anteriorly. Slight progression of L3 lytic metastasis. IMPRESSION: 1. Progressive pulmonary metastatic disease. 2. Right lower lobe airspace consolidation with air bronchograms suggesting pneumonia. 3. Enlarging bilateral adrenal gland metastasis. 4. Progressive osseous metastatic disease. 5. Stable radiation changes in the right upper lobe laterally. No obvious recurrent tumor in this location. Aortic Atherosclerosis (ICD10-I70.0) and Emphysema (ICD10-J43.9).  Electronically Signed   By: Marijo Sanes M.D.   On: 04/18/2020 10:30   US Venous Img Lower Unilateral Right  Result Date: 04/18/2020 CLINICAL DATA:  69 year old male with a history swelling EXAM: RIGHT LOWER EXTREMITY VENOUS DOPPLER ULTRASOUND TECHNIQUE: Gray-scale sonography with graded compression, as well as color Doppler and duplex ultrasound were performed to evaluate the lower extremity deep venous systems from the level of the common femoral vein and including the common femoral, femoral, profunda femoral, popliteal and calf veins including the posterior tibial, peroneal and gastrocnemius veins when visible. The superficial great saphenous vein was also interrogated. Spectral Doppler was utilized to evaluate flow at rest and with distal augmentation maneuvers in the common femoral, femoral and popliteal veins. COMPARISON:  None. FINDINGS: Contralateral Common Femoral Vein: Respiratory phasicity is normal and symmetric with the symptomatic side. No evidence of thrombus. Normal compressibility. Common Femoral Vein: No evidence of thrombus. Normal compressibility, respiratory phasicity and response to augmentation. Saphenofemoral Junction: No evidence of thrombus. Normal compressibility and flow on color Doppler imaging. Profunda Femoral Vein: No evidence of thrombus. Normal compressibility and flow on color Doppler imaging. Femoral Vein: No evidence of thrombus. Normal compressibility, respiratory phasicity and response to augmentation. Popliteal Vein: No evidence of thrombus. Normal compressibility, respiratory phasicity and response to augmentation. Calf Veins: No evidence of thrombus. Normal compressibility and flow on color Doppler imaging. Superficial Great Saphenous Vein: No evidence of thrombus. Normal compressibility and flow on color Doppler imaging. Other Findings:  None. IMPRESSION: Sonographic survey of the right lower extremity negative for DVT Electronically Signed   By: Corrie Mckusick D.O.    On: 04/18/2020 09:55   DG Chest Port 1 View  Result Date: 04/18/2020 CLINICAL DATA:  ALTERED MENTAL STATUS  EXAM: PORTABLE CHEST 1 VIEW COMPARISON:  12/14/2019; 06/25/2019; chest CT-03/03/2020 FINDINGS: Grossly unchanged enlarged cardiac silhouette and mediastinal contours post median sternotomy and CABG. Slight retraction of a left jugular approach port a catheter with tip now projected over the central aspect of the left innominate vein. The lungs remain hyperexpanded. Ill-defined nodular interstitial opacities involving the peripheral aspect of the right mid lung as well as the right lung base are unchanged. No new discrete focal airspace opacities. Persistent blunting of the right costophrenic angle without definite pleural effusion. Note is made of a crescentic lucency below the left hemidiaphragm. No acute osseous abnormalities. IMPRESSION: 1. Similar findings of cardiomegaly and known right greater than left pulmonary metastatic disease. 2. Crescentic lucency below the left hemidiaphragm may represent air within the stomach, however pneumoperitoneum could have a similar appearance. Correlation for abdominal symptoms is advised. Further evaluation with decubital abdominal radiographic series could be performed as indicated. 3. Continued retraction of left jugular approach port a catheter with tip now projected over the central aspect the left innominate vein. Critical Value/emergent results were called by telephone at the time of interpretation on 04/18/2020 at 8:23 am to provider PHILLIP STAFFORD , who verbally acknowledged these results. Electronically Signed   By: Sandi Mariscal M.D.   On: 04/18/2020 08:22     Subjective:   Patient was seen and examined 04/21/2020, 12:19 PM Patient stable today. No acute distress.  No issues overnight Stable for discharge.  Discharge Exam:    Vitals:   04/21/20 0436 04/21/20 0535 04/21/20 0725 04/21/20 0742  BP:  128/63  (!) 107/51  Pulse:  93  89  Resp:    16    Temp:  98.5 F (36.9 C)  97.9 F (36.6 C)  TempSrc:  Oral    SpO2:  98% 90% 90%  Weight: 74.8 kg     Height:        General: Pt lying comfortably in bed & appears in no obvious distress. Cardiovascular: S1 & S2 heard, RRR, S1/S2 +. No murmurs, rubs, gallops or clicks. No JVD or pedal edema. Respiratory: Clear to auscultation without wheezing, rhonchi or crackles. No increased work of breathing. Abdominal:  Non-distended, non-tender & soft. No organomegaly or masses appreciated. Normal bowel sounds heard. CNS: Alert and oriented. No focal deficits. Extremities: no edema, no cyanosis    The results of significant diagnostics from this hospitalization (including imaging, microbiology, ancillary and laboratory) are listed below for reference.      Microbiology:   Recent Results (from the past 240 hour(s))  Blood Culture (routine x 2)     Status: None (Preliminary result)   Collection Time: 04/18/20  8:04 AM   Specimen: BLOOD  Result Value Ref Range Status   Specimen Description BLOOD BLOOD RIGHT ARM  Final   Special Requests   Final    BOTTLES DRAWN AEROBIC AND ANAEROBIC Blood Culture adequate volume   Culture   Final    NO GROWTH 3 DAYS Performed at [facility], [address] [place],  [address]    Report Status PENDING  Incomplete  Blood Culture (routine x 2)     Status: None (Preliminary result)   Collection Time: 04/18/20  8:05 AM   Specimen: BLOOD  Result Value Ref Range Status   Specimen Description BLOOD RIGHT ANTECUBITAL  Final   Special Requests   Final    BOTTLES DRAWN AEROBIC AND ANAEROBIC Blood Culture adequate volume   Culture   Final  NO GROWTH 3 DAYS Performed at [facility], [address] [place], [place] [address]    Report Status PENDING  Incomplete  SARS Coronavirus 2 by RT PCR (hospital order, performed in [facility] hospital lab) Nasopharyngeal Nasopharyngeal Swab     Status: None   Collection Time: 04/18/20   8:05 AM   Specimen: Nasopharyngeal Swab  Result Value Ref Range Status   SARS Coronavirus 2 NEGATIVE NEGATIVE Final    Comment: (NOTE) SARS-CoV-2 target nucleic acids are NOT DETECTED.  The SARS-CoV-2 RNA is generally detectable in upper and lower respiratory specimens during the acute phase of infection. The lowest concentration of SARS-CoV-2 viral copies this assay can detect is 250 copies / mL. A negative result does not preclude SARS-CoV-2 infection and should not be used as the sole basis for treatment or other patient management decisions.  A negative result may occur with improper specimen collection / handling, submission of specimen other than nasopharyngeal swab, presence of viral mutation(s) within the areas targeted by this assay, and inadequate number of viral copies (<250 copies / mL). A negative result must be combined with clinical observations, patient history, and epidemiological information.  Fact Sheet for Patients:   StrictlyIdeas.no  Fact Sheet for Healthcare Providers: BankingDealers.co.za  This test is not yet approved or  cleared by the [place] FDA and has been authorized for detection and/or diagnosis of SARS-CoV-2 by FDA under an Emergency Use Authorization (EUA).  This EUA will remain in effect (meaning this test can be used) for the duration of the COVID-19 declaration under Section 564(b)(1) of the Act, 21 U.S.C. section 360bbb-3(b)(1), unless the authorization is terminated or revoked sooner.  Performed at [facility], [address], [place], [place] [address]   Urine culture     Status: None   Collection Time: 04/18/20 12:30 PM   Specimen: Urine, Random  Result Value Ref Range Status   Specimen Description   Final    URINE, RANDOM Performed at [facility], [address] [place], [place] [address]    Special Requests   Final    NONE Performed at [facility], [address] [place], [place] [address]    Culture   Final    NO GROWTH Performed at [facility], [place] [address] [place], [place] [address]    Report Status 04/20/2020 FINAL  Final  MRSA PCR Screening     Status: None   Collection Time: 04/18/20  3:45 PM   Specimen: Nasal Mucosa; Nasopharyngeal  Result Value Ref Range Status   MRSA by PCR NEGATIVE NEGATIVE Final    Comment:        The GeneXpert MRSA Assay (FDA approved for NASAL specimens only), is one component of a comprehensive MRSA colonization surveillance program. It is not intended to diagnose MRSA infection nor to guide or monitor treatment for MRSA infections. Performed at [facility], [address] [place],  [address]      Labs:   CBC: Recent Labs  Lab 04/18/20 0804 04/19/20 0449 04/20/20 0514 04/21/20 0528  WBC 13.6* 12.8* 12.8* 12.4*  NEUTROABS 10.6*  --   --   --   HGB 9.7* 9.6* 9.1* 8.9*  HCT 29.4* 30.3* 27.8* 27.4*  MCV 93.9 94.1 93.9 93.5  PLT 220 185 211 017   Basic Metabolic Panel: Recent Labs  Lab 04/18/20 0804 04/19/20 0449 04/20/20 0514 04/21/20 0528  NA 134* 135 138 139  K 4.7 4.9 4.5  4.3  CL 98 102 102 105  CO2 '25 24 26 28  '$ GLUCOSE 92 129* 127* 120*  BUN 33* 37* 42* 29*  CREATININE 2.45* 2.03* 1.79* 1.47*  CALCIUM 8.4* 8.6* 8.4* 8.5*   Liver Function Tests: Recent Labs  Lab 04/18/20 0804  AST 56*  ALT 17  ALKPHOS 85  BILITOT 1.1  PROT 6.7  ALBUMIN 3.2*   BNP (last 3 results) Recent Labs    04/18/20 0804  BNP 325.7*   Cardiac Enzymes: No results for input(s): CKTOTAL, CKMB, CKMBINDEX, TROPONINI in the last 168 hours. CBG: Recent Labs  Lab 04/20/20 0723 04/20/20 1144 04/20/20 1646 04/20/20 2053 04/21/20 0744  GLUCAP 112* 138* 122* 106* 104*   Hgb A1c No results for input(s): HGBA1C in the last 72 hours. Lipid Profile No results for input(s): CHOL, HDL, LDLCALC, TRIG, CHOLHDL, LDLDIRECT in the last 72 hours. Thyroid  function studies No results for input(s): TSH, T4TOTAL, T3FREE, THYROIDAB in the last 72 hours.  Invalid input(s): FREET3 Anemia work up No results for input(s): VITAMINB12, FOLATE, FERRITIN, TIBC, IRON, RETICCTPCT in the last 72 hours. Urinalysis    Component Value Date/Time   COLORURINE AMBER (A) 04/18/2020 1230   APPEARANCEUR HAZY (A) 04/18/2020 1230   APPEARANCEUR Clear 10/26/2019 1511   LABSPEC 1.019 04/18/2020 1230   PHURINE 5.0 04/18/2020 1230   GLUCOSEU NEGATIVE 04/18/2020 1230   HGBUR SMALL (A) 04/18/2020 1230   BILIRUBINUR NEGATIVE 04/18/2020 1230   BILIRUBINUR Negative 10/26/2019 1511   KETONESUR NEGATIVE 04/18/2020 1230   PROTEINUR 30 (A) 04/18/2020 1230   UROBILINOGEN 1.0 09/04/2009 0122   NITRITE NEGATIVE 04/18/2020 1230   LEUKOCYTESUR NEGATIVE 04/18/2020 1230         Time coordinating discharge: Over 45 minutes  SIGNED: Deatra Montreal, MD, FACP, FHM. [facility],  Please use [email or web address] to Page If 7PM-7AM, please contact night-coverage [email or web address] 04/21/2020, 12:19 PM

## 2020-04-21 NOTE — Care Management Important Message (Signed)
Important Message  Patient Details  Name: Troy [name] MRN: [id number] Date of Birth: 07-24-1951   Medicare Important Message Given:  Yes     Dannette Barbara 04/21/2020, 3:05 PM

## 2020-04-23 LAB — CULTURE, BLOOD (ROUTINE X 2)
Culture: NO GROWTH
Culture: NO GROWTH
Special Requests: ADEQUATE
Special Requests: ADEQUATE

## 2020-04-24 ENCOUNTER — Ambulatory Visit: Payer: Medicare Other

## 2020-04-25 ENCOUNTER — Ambulatory Visit: Payer: Medicare Other

## 2020-04-26 ENCOUNTER — Ambulatory Visit: Payer: Medicare Other

## 2020-04-27 ENCOUNTER — Ambulatory Visit: Payer: Medicare Other

## 2020-04-28 ENCOUNTER — Ambulatory Visit: Payer: Medicare Other

## 2020-05-01 ENCOUNTER — Other Ambulatory Visit: Payer: Self-pay | Admitting: Oncology

## 2020-05-01 MED ORDER — MORPHINE SULFATE (CONCENTRATE) 20 MG/ML PO SOLN: 15.0000 mg | ORAL | 0 refills | Status: DC | PRN

## 2020-05-01 NOTE — Progress Notes (Signed)
orphoin

## 2020-05-15 ENCOUNTER — Other Ambulatory Visit: Payer: Self-pay | Admitting: Hospice and Palliative Medicine

## 2020-05-15 MED ORDER — SULFAMETHOXAZOLE-TRIMETHOPRIM 800-160 MG PO TABS
1.0000 | ORAL_TABLET | Freq: Two times a day (BID) | ORAL | 0 refills | Status: AC
Start: 2020-05-15 — End: ?

## 2020-05-15 MED ORDER — MORPHINE SULFATE ER 15 MG PO TBCR: 15.0000 mg | EXTENDED_RELEASE_TABLET | Freq: Two times a day (BID) | ORAL | 0 refills | Status: AC

## 2020-05-15 NOTE — Addendum Note (Signed)
Addended by: Altha Harm R on: 05/15/2020 01:55 PM   Modules accepted: Orders

## 2020-05-15 NOTE — Progress Notes (Addendum)
Spoke with patient's hospice nurse.  Patient has been on cephalexin 500mg  BID (prescribed on 05/05/20 but not started until 05/08/20). She sent me pictures of his legs and both appear to have large erythematous areas with small ulcerations (purulence?). They have marked the margins of the erythematous areas. No fevers and chills. Otherwise, patient is reported to be doing better. Will start on Bactrim DS BID x 7 days. He can concurrently finish his course of cephalexin. This should expand coverage to include MRSA for presumed cellulitis. RN will follow closely in the home.   CrCl estimated on be 48 based on last labs on 04/21/20  Hospice nurse, Mitzie, also requested refill of the [facility] ER. Unfortunately, hospice will not cover this medication and it is cost prohibitive to the patient. Will rotate him to MS Contin 15mg  Q12H with plan to titrate as needed (#30).

## 2020-05-16 DIAGNOSIS — J449 Chronic obstructive pulmonary disease, unspecified: Secondary | ICD-10-CM | POA: Diagnosis not present

## 2020-05-26 ENCOUNTER — Other Ambulatory Visit: Payer: Self-pay | Admitting: Hospice and Palliative Medicine

## 2020-05-26 MED ORDER — MORPHINE SULFATE (CONCENTRATE) 20 MG/ML PO SOLN: 15.0000 mg | ORAL | 0 refills | Status: AC | PRN

## 2020-05-28 ENCOUNTER — Emergency Department
Admission: EM | Admit: 2020-05-28 | Discharge: 2020-05-28 | Disposition: A | Discharge status: Home / Self Care | Attending: Emergency Medicine | Admitting: Emergency Medicine

## 2020-05-28 ENCOUNTER — Encounter: Payer: Self-pay | Admitting: Emergency Medicine

## 2020-05-28 ENCOUNTER — Other Ambulatory Visit: Payer: Self-pay

## 2020-05-28 ENCOUNTER — Emergency Department

## 2020-05-28 DIAGNOSIS — Y999 Unspecified external cause status: Secondary | ICD-10-CM | POA: Diagnosis not present

## 2020-05-28 DIAGNOSIS — Z5321 Procedure and treatment not carried out due to patient leaving prior to being seen by health care provider: Secondary | ICD-10-CM | POA: Diagnosis not present

## 2020-05-28 DIAGNOSIS — Y929 Unspecified place or not applicable: Secondary | ICD-10-CM | POA: Diagnosis not present

## 2020-05-28 DIAGNOSIS — M25552 Pain in left hip: Secondary | ICD-10-CM | POA: Diagnosis not present

## 2020-05-28 DIAGNOSIS — S51802A Unspecified open wound of left forearm, initial encounter: Secondary | ICD-10-CM | POA: Insufficient documentation

## 2020-05-28 DIAGNOSIS — R0902 Hypoxemia: Secondary | ICD-10-CM | POA: Diagnosis not present

## 2020-05-28 DIAGNOSIS — Z743 Need for continuous supervision: Secondary | ICD-10-CM | POA: Diagnosis not present

## 2020-05-28 DIAGNOSIS — W1789XA Other fall from one level to another, initial encounter: Secondary | ICD-10-CM | POA: Insufficient documentation

## 2020-05-28 DIAGNOSIS — W19XXXA Unspecified fall, initial encounter: Secondary | ICD-10-CM | POA: Diagnosis not present

## 2020-05-28 DIAGNOSIS — Y939 Activity, unspecified: Secondary | ICD-10-CM | POA: Diagnosis not present

## 2020-05-28 DIAGNOSIS — S51801A Unspecified open wound of right forearm, initial encounter: Secondary | ICD-10-CM | POA: Diagnosis present

## 2020-05-28 DIAGNOSIS — R52 Pain, unspecified: Secondary | ICD-10-CM | POA: Diagnosis not present

## 2020-05-28 DIAGNOSIS — R0602 Shortness of breath: Secondary | ICD-10-CM | POA: Diagnosis not present

## 2020-05-28 NOTE — ED Triage Notes (Signed)
Patient tripped and fell off of his deck. Patient states about 7 feet. Patient with complaint of left hip pain. Patient with skin tears to bilateral arms. Patient denies hitting his head.

## 2020-05-28 NOTE — ED Notes (Addendum)
Wife came into lobby to use bathroom, states that the patient is trying to leave, advised wife that we cannot keep patient here and if she is wanting someone to help her get him in the car we can help. Wife agreeable to take patient home.   As we were getting the patient in the car pt required 2 person assist, asked how the patient would be managed at home since he cannot do anything himself. Wife started to yell at me stating "just stop, he's dying" I informed the wife that I am simply trying to look out for the patient and his safety, wife stated that she would call her son to help get him out of the car, and that she would call the rescue unit to help if he needed to get up.  Wife continued to argue this nurse walked away.

## 2020-06-05 ENCOUNTER — Encounter: Payer: Self-pay | Admitting: Oncology

## 2020-06-07 ENCOUNTER — Inpatient Hospital Stay: Payer: Medicare Other

## 2020-06-07 ENCOUNTER — Ambulatory Visit: Admission: RE | Admit: 2020-06-07 | Payer: Medicare Other | Source: Ambulatory Visit

## 2020-06-12 ENCOUNTER — Encounter: Payer: Self-pay | Admitting: Oncology

## 2020-06-12 ENCOUNTER — Inpatient Hospital Stay: Payer: Medicare Other | Admitting: Oncology

## 2020-06-14 DEATH — deceased

## 2020-08-24 ENCOUNTER — Ambulatory Visit: Payer: Medicare Other | Admitting: Physician Assistant

## 2020-11-18 IMAGING — MR MR HEAD WO/W CM
15 series · 48 of 48 positions shown · IV contrast (7ml Gadavist)
Comparison: [HOSPITAL] head CT without contrast
02/22/2008.

CLINICAL DATA: 68-year-old male recently diagnosed with lung
cancer.

EXAM:
MRI HEAD WITHOUT AND WITH CONTRAST
TECHNIQUE: Multiplanar, multiecho pulse sequences of the brain and surrounding
structures were obtained without and with intravenous contrast.
CONTRAST:  7mL GADAVIST GADOBUTROL 1 MMOL/ML IV SOLN

[Series 5: ax dwi_tracew · axial · 3.0mm · 0.83mm/px · z∈[-97,+65]mm · 4 of 55 slices shown]
[im 1/55]
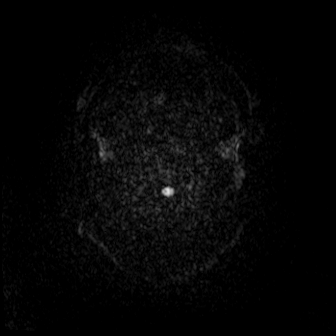
[im 19/55]
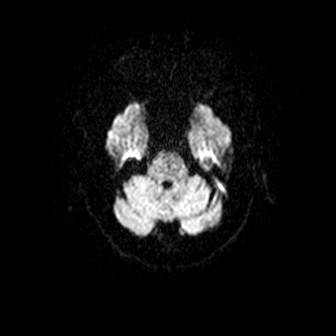
[im 37/55]
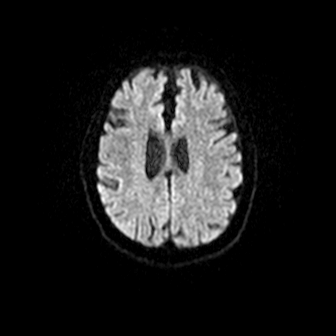
[im 55/55]
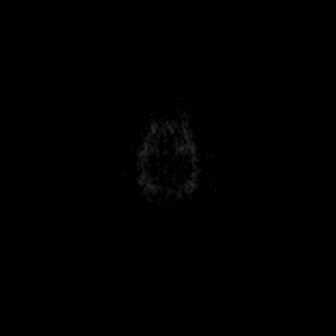

[Series 6: ax dwi_adc · axial · 3.0mm · 0.83mm/px · z∈[-97,+65]mm · 3 of 55 slices shown]
[im 1/55]
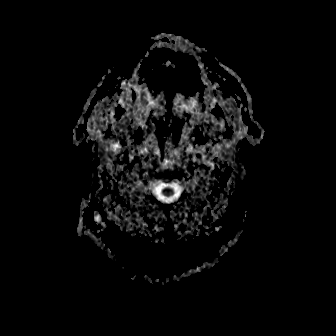
[im 28/55]
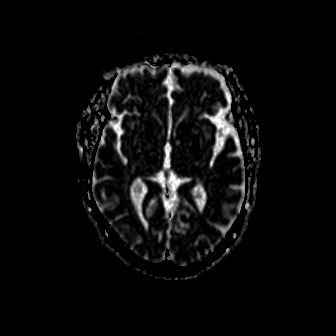
[im 55/55]
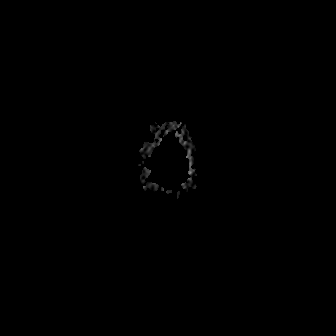

[Series 7: cor dwi_tracew · coronal · 5.0mm · 0.68mm/px · 2 of 39 slices shown]
[im 1/39]
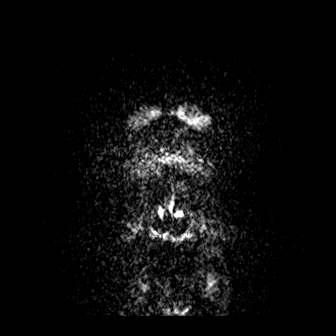
[im 39/39]
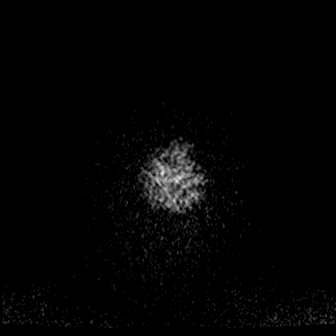

[Series 8: cor dwi_adc · coronal · 5.0mm · 0.68mm/px · 2 of 39 slices shown]
[im 1/39]
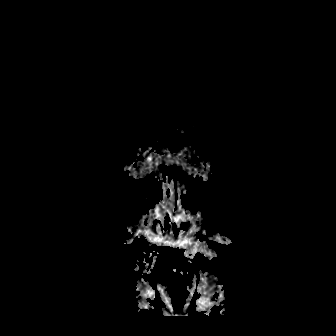
[im 39/39]
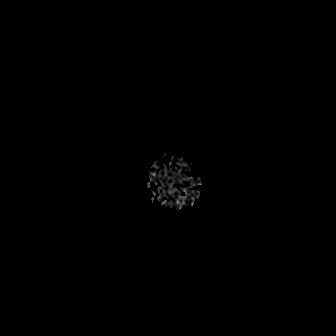

[Series 9: T1 · sagittal · 5.0mm · 0.62mm/px · 1 of 23 slices shown (1 of 2)]
[im 1/23]
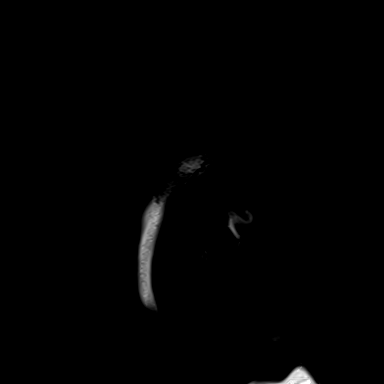

[Series 10: T2 · axial · 5.0mm · 0.53mm/px · 1 of 25 slices shown]
[im 1/25]
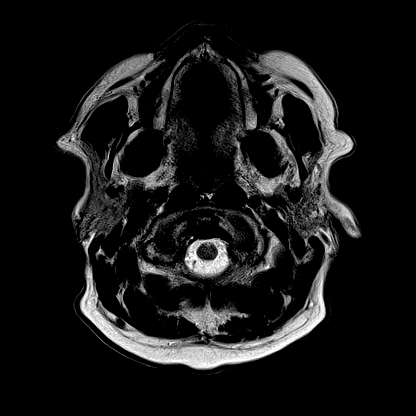

[Series 11: mag_images · axial · 3.0mm · 0.90mm/px · z∈[-104,+71]mm · 3 of 60 slices shown]
[im 1/60]
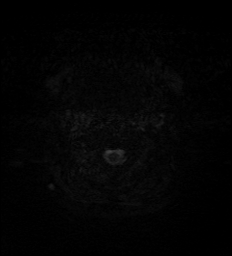
[im 30/60]
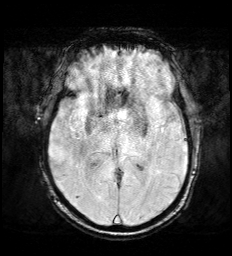
[im 60/60]
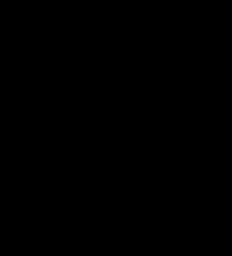

[Series 12: pha_images · axial · 3.0mm · 0.90mm/px · z∈[-98,+68]mm · 3 of 57 slices shown]
[im 1/57]
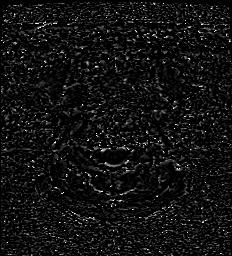
[im 29/57]
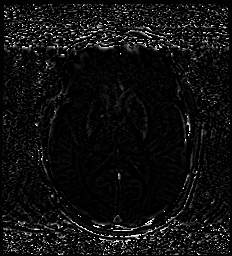
[im 57/57]
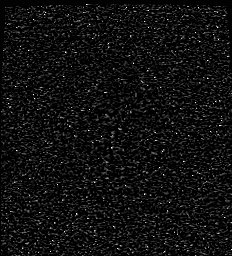

[Series 13: swi_images · axial · 3.0mm · 0.90mm/px · z∈[-104,+71]mm · 3 of 60 slices shown]
[im 1/60]
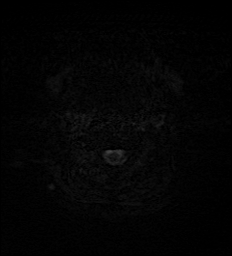
[im 30/60]
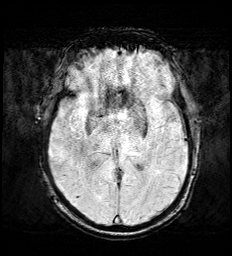
[im 60/60]
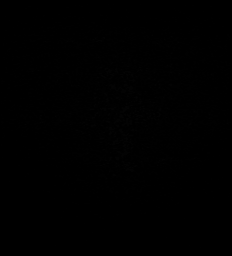

[Series 15: FLAIR · axial · 3.0mm · 0.53mm/px · z∈[-96,+64]mm · 3 of 55 slices shown]
[im 1/55]
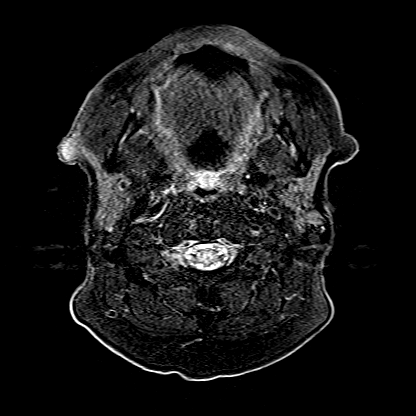
[im 28/55]
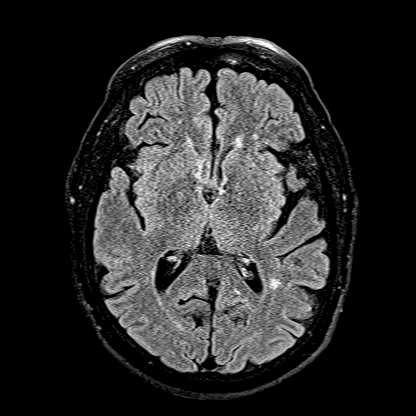
[im 55/55]
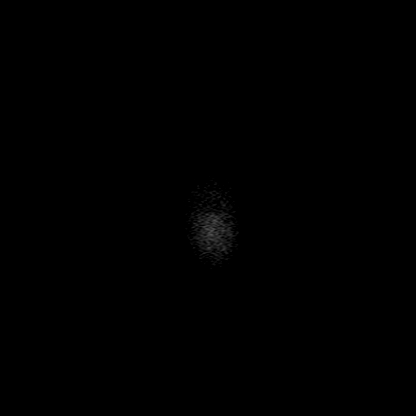

[Series 16: T1 · axial · 1.0mm · 0.98mm/px · z∈[-108,+65]mm · 9 of 175 slices shown (2 of 2)]
[im 1/175]
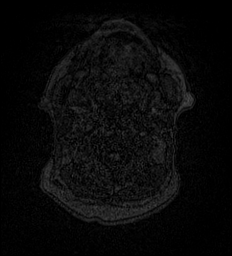
[im 22/175]
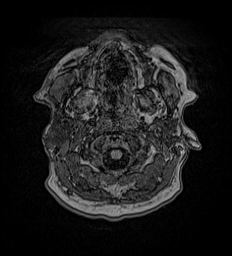
[im 44/175]
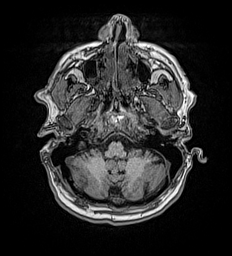
[im 66/175]
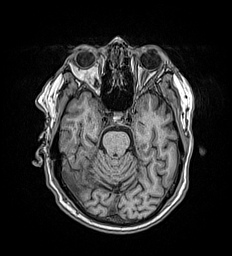
[im 88/175]
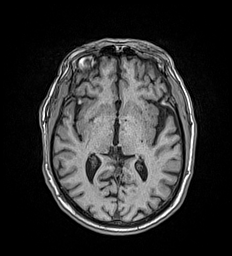
[im 109/175]
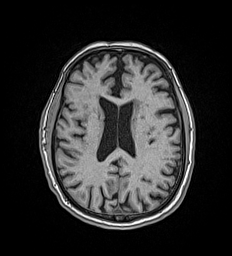
[im 131/175]
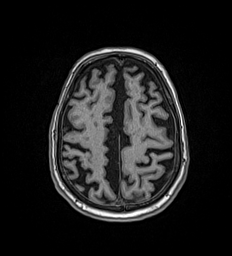
[im 153/175]
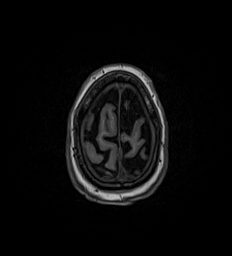
[im 175/175]
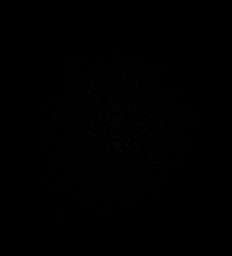

[Series 17: T2 post-contrast · coronal · 5.0mm · 0.57mm/px · 2 of 29 slices shown]
[im 1/29]
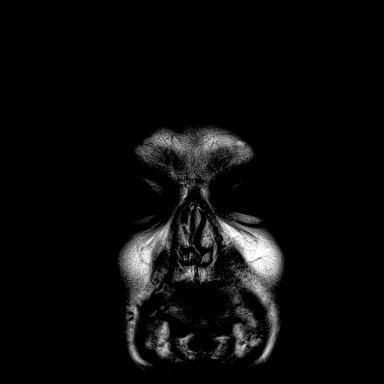
[im 29/29]
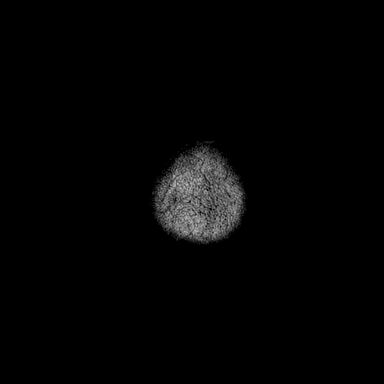

[Series 18: T1 post-contrast · axial · 1.0mm · 0.98mm/px · z∈[-108,+66]mm · 9 of 176 slices shown (1 of 3)]
[im 1/176]
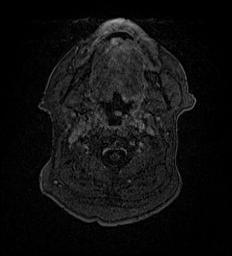
[im 22/176]
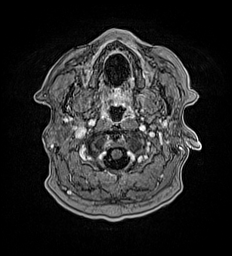
[im 44/176]
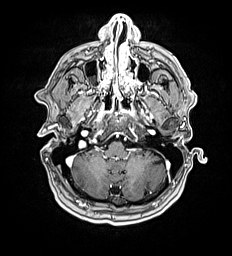
[im 66/176]
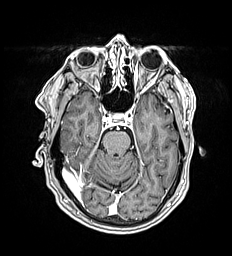
[im 88/176]
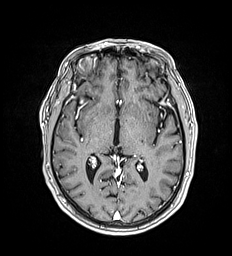
[im 110/176]
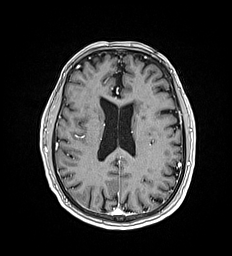
[im 132/176]
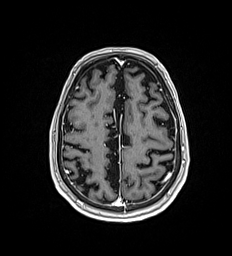
[im 154/176]
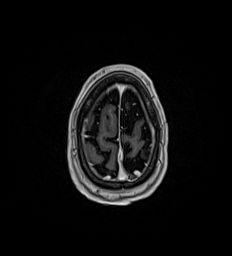
[im 176/176]
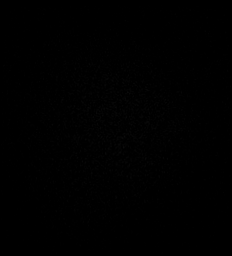

[Series 19: T1 post-contrast · coronal · 5.0mm · 0.57mm/px · 2 of 29 slices shown (2 of 3)]
[im 1/29]
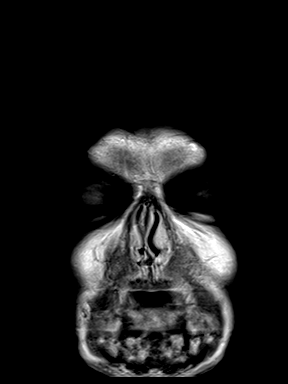
[im 29/29]
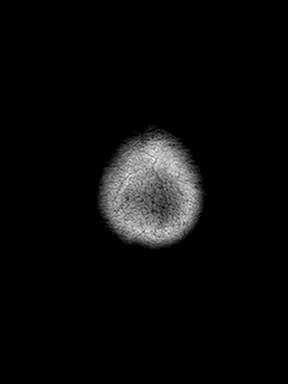

[Series 20: T1 post-contrast · sagittal · 5.0mm · 0.62mm/px · 1 of 23 slices shown (3 of 3)]
[im 1/23]
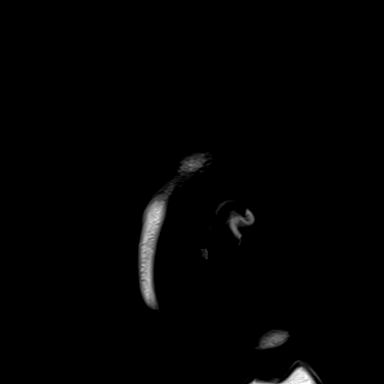

[48 of 48 positions shown; findings below may reference images not displayed]

FINDINGS: Brain: No abnormal enhancement identified. No midline shift, mass
effect, or evidence of intracranial mass lesion. No dural
thickening.

Cerebral volume is within normal limits for age. No restricted
diffusion to suggest acute infarction. No ventriculomegaly,
extra-axial collection or acute intracranial hemorrhage.
Cervicomedullary junction and pituitary are within normal limits.

Widely scattered small foci of nonspecific cerebral white matter T2
and FLAIR hyperintensity, moderate for age. No cortical
encephalomalacia or definite chronic cerebral blood products. The
deep gray nuclei, brainstem and cerebellum are within normal limits
for age.

Vascular: Major intracranial vascular flow voids are preserved. The
major dural venous sinuses are enhancing and appear to be patent.

Skull and upper cervical spine: Negative visible cervical spine and
spinal cord. Visualized bone marrow signal is within normal limits.

Sinuses/Orbits: Postoperative changes to both globes. Right
maxillary sinus mucosal thickening or small fluid level.

Other: Mastoids are clear. Visible internal auditory structures
appear normal. Scalp and face soft tissues appear negative.
IMPRESSION: 1.  No metastatic disease or acute intracranial abnormality.
2. Moderate for age nonspecific cerebral white matter signal
changes, most commonly due to chronic small vessel disease.
